# Patient Record
Sex: Male | Born: 1980 | Race: Black or African American | Hispanic: No | Marital: Married | Smoking: Never smoker
Health system: Southern US, Community
[De-identification: ages and names within clinical notes are randomized; demographics above are authoritative.]

## PROBLEM LIST (undated history)

## (undated) DIAGNOSIS — E11319 Type 2 diabetes mellitus with unspecified diabetic retinopathy without macular edema: Secondary | ICD-10-CM

## (undated) DIAGNOSIS — I10 Essential (primary) hypertension: Secondary | ICD-10-CM

## (undated) DIAGNOSIS — N186 End stage renal disease: Secondary | ICD-10-CM

## (undated) DIAGNOSIS — D649 Anemia, unspecified: Secondary | ICD-10-CM

## (undated) DIAGNOSIS — Z9889 Other specified postprocedural states: Secondary | ICD-10-CM

## (undated) DIAGNOSIS — Z9289 Personal history of other medical treatment: Secondary | ICD-10-CM

## (undated) DIAGNOSIS — F419 Anxiety disorder, unspecified: Secondary | ICD-10-CM

## (undated) DIAGNOSIS — E119 Type 2 diabetes mellitus without complications: Secondary | ICD-10-CM

## (undated) DIAGNOSIS — N289 Disorder of kidney and ureter, unspecified: Secondary | ICD-10-CM

## (undated) DIAGNOSIS — R112 Nausea with vomiting, unspecified: Secondary | ICD-10-CM

## (undated) DIAGNOSIS — I639 Cerebral infarction, unspecified: Secondary | ICD-10-CM

## (undated) HISTORY — DX: Cerebral infarction, unspecified: I63.9

## (undated) HISTORY — PX: OTHER SURGICAL HISTORY: SHX169

## (undated) HISTORY — PX: WISDOM TOOTH EXTRACTION: SHX21

## (undated) HISTORY — DX: Type 2 diabetes mellitus with unspecified diabetic retinopathy without macular edema: E11.319

## (undated) NOTE — *Deleted (*Deleted)
[facility] Note  04/20/2020     CHIEF COMPLAINT Patient presents for No chief complaint on file.   HISTORY OF PRESENT ILLNESS: Bill [name] is a 56 y.o. male who presents to the clinic today for:   pt has had cataract sx OU, he states he feels like it helped his vision  Referring physician: Isaac Bliss, Rayford Halsted, MD [place],  [place] [address]  HISTORICAL INFORMATION:   Selected notes from the MEDICAL RECORD NUMBER Referred by Dr. [name] for concern of severe NPDR / PDR OU; LEE-  Ocular Hx-  PMH-     CURRENT MEDICATIONS: No current outpatient medications on file. (Ophthalmic Drugs)   No current facility-administered medications for this visit. (Ophthalmic Drugs)   Current Outpatient Medications (Other)  Medication Sig  . aspirin (BAYER ASPIRIN EC LOW DOSE) 81 MG EC tablet Take 81 mg by mouth as needed.  [name] 1 GM 210 MG(Fe) tablet Take 210 mg by mouth 3 (three) times daily with meals.  . ferric citrate (AURYXIA) 1 GM 210 MG(Fe) tablet Take 210 mg by mouth as directed.  . fluticasone (FLONASE) 50 MCG/ACT nasal spray Place 1 spray into both nostrils daily as needed for allergies or rhinitis.  . multivitamin (RENA-VIT) TABS tablet Take 1 tablet by mouth daily.  . sertraline (ZOLOFT) 50 MG tablet TAKE 1 TABLET(50 MG) BY MOUTH DAILY   No current facility-administered medications for this visit. (Other)      REVIEW OF SYSTEMS:    ALLERGIES Allergies  Allergen Reactions  . Food     Melons-itchy/headaches    PAST MEDICAL HISTORY Past Medical History:  Diagnosis Date  . Anemia   . Anxiety    situational  . Diabetes mellitus without complication ([place])   . Diabetic retinopathy ([place])    PDR OU  . ESRD on dialysis [facility])    Dialyzing T-Th-S  . History of blood transfusion   . Hypertension   . PONV (postoperative nausea and vomiting)    03/22/2019- only 15 years ago  . Renal disorder   . Stroke  [facility]) 2019   vision   Past Surgical History:  Procedure Laterality Date  . A/V FISTULAGRAM N/A 03/11/2019   Procedure: A/V FISTULAGRAM - Left Arm;  Surgeon: Waynetta Sandy, MD;  Location: [facility];  Service: Cardiovascular;  Laterality: N/A;  . arm surgery Right    Fracture  . AV FISTULA PLACEMENT Left 01/25/2019   Procedure: ARTERIOVENOUS (AV) FISTULA CREATION  LEFT UPPER  ARM;  Surgeon: Serafina Mitchell, MD;  Location: [facility];  Service: Vascular;  Laterality: Left;  . BASCILIC VEIN TRANSPOSITION Left 03/27/2019   Procedure: SECOND STAGE BASILIC VEIN TRANSPOSITION LEFT ARM;  Surgeon: Serafina Mitchell, MD;  Location: [facility];  Service: Vascular;  Laterality: Left;  . WISDOM TOOTH EXTRACTION      FAMILY HISTORY Family History  Problem Relation Age of Onset  . Diabetes Mellitus II Mother   . Hypertension Mother   . Cataracts Mother   . Diabetes Mellitus II Father   . Stroke Father   . Hypertension Father   . Glaucoma Maternal Grandmother   . Amblyopia Neg Hx   . Blindness Neg Hx   . Macular degeneration Neg Hx   . Retinal detachment Neg Hx   . Strabismus Neg Hx   . Retinitis pigmentosa Neg Hx     SOCIAL HISTORY Social History   Tobacco  Use  . Smoking status: Never Smoker  . Smokeless tobacco: Never Used  Vaping Use  . Vaping Use: Never used  Substance Use Topics  . Alcohol use: Not Currently  . Drug use: Not Currently         OPHTHALMIC EXAM:  Not recorded     IMAGING AND PROCEDURES  Imaging and Procedures for @TODAY @           ASSESSMENT/PLAN:   ICD-10-CM   1. Proliferative diabetic retinopathy of both eyes with macular edema associated with type 2 diabetes mellitus ([place])  [id number]   2. Vitreous hemorrhage of both eyes ([place])  H43.13   3. Retinal edema  H35.81   4. Essential hypertension  I10   5. Hypertensive retinopathy of both eyes  H35.033   6. Ocular hypertension, bilateral  H40.053   7. Acute CVA (cerebrovascular accident)  ([place])  I63.9   8. Combined forms of age-related cataract of right eye  H25.811   9. Pseudophakia  Z96.1   10. Combined forms of age-related cataract of both eyes  H25.813   11. History of noncompliance with medical treatment  Z91.19   12. Proliferative diabetic retinopathy of right eye with macular edema associated with type 2 diabetes mellitus ([place])  E11.3511   13. Severe nonproliferative diabetic retinopathy of left eye with macular edema associated with type 2 diabetes mellitus ([place])  [id number]   14. Moderate nonproliferative diabetic retinopathy of both eyes with macular edema associated with type 2 diabetes mellitus ([place])  [id number]    1-3. Proliferative diabetic retinopathy OU  - delayed f/u from 4 wks to 8 wks  - s/p PRP OD (10.02.19), fill-in (01.22.20), fill in OD (02.12.20)   - s/p PRP OS (05/02/2018), fill-in (02.19.20)  - s/p IVK #1 OD (01.22.20), OS (02.12.20)  - s/p IVA OD #1 (10.21.20), #2 (12.02.20), #3 (01.13.21), #4 (02.10.21), #5 (04.19.21), #6 (05.21.21), #7 (06.21.21), #8 (08.19.21), #9 (10.11.21)  - s/p IVA OS #1 (12.02.20), #2 (04.19.21), #3 (05.21.21), #4 (06.21.21), #5 (08.19.21), #6 (10.11.21)  - FA 8.19.19 shows leaking MA, patches of capillary nonperfusion OU  - pt was lost to f/u from 02.19.2020 to 10.21.20  - discussed the importance of compliance with medical therapy and follow ups  - presented acutely (10.21.2020) for decreased vision and new onset floaters OD  - today, pt reports subjective improvement in vision OU -- had cataract surgery OS w/ Dr. Kathlen Mody  - BCVA stable at 20/40 OD; OS stable at 20/50   - exam shows interval decrease in VH, preretinal and subhyaloid hemorrhage, persistent preretinal fibrosis OU  - OCT shows Mild Interval improvement in vitreous opacities OU  - repeat FA 10.11.21 shows persistent NV OU, large patches of vascular nonperfusion OU (OS>OD) -- would benefit from some fill in PRP  - VH precautions reviewed -- minimize activities, keep  head elevated, avoid ASA/NSAIDs/blood thinners as able  - pt now on heparin  - discussed possibility of surgery being needed in the future to stabilize eye and prevent further progression of diabetic eye disease  - recommend IVA OD #10 and OS #7 today, 11.08.21  - pt wishes to proceed  - RBA of procedure discussed, questions answered  - informed consent obtained   - Avastin informed consent form signed and scanned on 01.13.2021  - see procedure note  - f/u 4 weeks -- DFE/OCT, possible injection  4,5. 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 [name] in mid August 2019 with  a 2 wk history intractable headaches and decreased vision OU  - found to have BP of 170s/90s -- sent to ED and was admitted to [facility] where SBP was up to 200  - discussed importance of tight BP control  - pt reports improved compliance with BP medications and improvement in BP  6. 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 Limited 2 sequence MRI head: Acute RIGHT periatrial subcentimeter  nonhemorrhagic infarct, in region of optic tract.  - could be limiting vision -- will check formal visual field once macular edema improves  7. Ocular Hypertention OU  - today, IOP OD 14, OS 15  - pt reports not taking Cosopt BID OU for about a month -- IOP okay  8. Mixed cataract OD - The symptoms of cataract, surgical options, and treatments and risks were discussed with patient. - discussed diagnosis and progression - under the expert management of Dr. Kathlen Mody  9. Pseudophakia OS  - s/p CE/IOL (07.29.21, Dr. Kathlen Mody)  - IOL in good position, doing well - monitor   Ophthalmic Meds Ordered this visit:  No orders of the defined types were placed in this encounter.      No follow-ups on file.  There are no Patient Instructions on file for this visit.  This document serves as a record of services personally performed by Gardiner Sleeper, MD,  PhD. It was created on their behalf by Leeann Must, [place], an ophthalmic technician. The creation of this record is the provider's dictation and/or activities during the visit.    Electronically signed by: Leeann Must, COA @TODAY @ 1:53 PM   Abbreviations: M myopia (nearsighted); A astigmatism; H hyperopia (farsighted); P presbyopia; Mrx spectacle prescription;  CTL contact lenses; OD right eye; OS left eye; OU both eyes  XT exotropia; ET esotropia; PEK punctate epithelial keratitis; PEE punctate epithelial erosions; DES dry eye syndrome; MGD meibomian gland dysfunction; ATs artificial tears; PFAT's preservative free artificial tears; [place] nuclear sclerotic cataract; PSC posterior subcapsular cataract; ERM epi-retinal membrane; PVD posterior vitreous detachment; RD retinal detachment; DM diabetes mellitus; DR diabetic retinopathy; NPDR non-proliferative diabetic retinopathy; PDR proliferative diabetic retinopathy; CSME clinically significant macular edema; DME diabetic macular edema; dbh dot blot hemorrhages; CWS cotton wool spot; POAG primary open angle glaucoma; C/D cup-to-disc ratio; HVF humphrey visual field; GVF goldmann visual field; OCT optical coherence tomography; IOP intraocular pressure; BRVO Branch retinal vein occlusion; CRVO central retinal vein occlusion; CRAO central retinal artery occlusion; BRAO branch retinal artery occlusion; RT retinal tear; SB scleral buckle; PPV pars plana vitrectomy; VH Vitreous hemorrhage; PRP panretinal laser photocoagulation; IVK intravitreal kenalog; VMT vitreomacular traction; MH Macular hole;  NVD neovascularization of the disc; NVE neovascularization elsewhere; AREDS age related eye disease study; ARMD age related macular degeneration; POAG primary open angle glaucoma; EBMD epithelial/anterior basement membrane dystrophy; ACIOL anterior chamber intraocular lens; IOL intraocular lens; PCIOL posterior chamber intraocular lens; Phaco/IOL phacoemulsification with  intraocular lens placement; [place] photorefractive keratectomy; LASIK laser assisted in situ keratomileusis; HTN hypertension; DM diabetes mellitus; COPD chronic obstructive pulmonary disease

## (undated) NOTE — *Deleted (*Deleted)
[facility] Note  02/26/2020     CHIEF COMPLAINT Patient presents for No chief complaint on file.   HISTORY OF PRESENT ILLNESS: Bill [name] is a 76 y.o. male who presents to the clinic today for:   pt had cataract sx with Dr. Kathlen Mody 3 weeks ago, he has a f/u appt with him this afternoon, today was his last day of post op drops, he got put on the kidney donor transplant list 2 weeks ago   Referring physician: Isaac Bliss, Rayford Halsted, MD [place],  [place] [address]  HISTORICAL INFORMATION:   Selected notes from the [facility] Referred by Dr. [name] for concern of severe NPDR / PDR OU; LEE-  Ocular Hx-  PMH-     CURRENT MEDICATIONS: Current Outpatient Medications (Ophthalmic Drugs)  Medication Sig  . dorzolamide-timolol (COSOPT) 22.3-6.8 MG/ML ophthalmic solution Place 1 drop into the right eye 2 (two) times daily. (Patient not taking: Reported on 07/24/2019)   No current facility-administered medications for this visit. (Ophthalmic Drugs)   Current Outpatient Medications (Other)  Medication Sig  . aspirin (BAYER ASPIRIN EC LOW DOSE) 81 MG EC tablet Take 81 mg by mouth as needed.  Marland Kitchen atorvastatin (LIPITOR) 80 MG tablet Take 1 tablet (80 mg total) by mouth every evening.  [name] 1 GM 210 MG(Fe) tablet Take 210 mg by mouth 3 (three) times daily with meals.  . ferric citrate (AURYXIA) 1 GM 210 MG(Fe) tablet Take 210 mg by mouth as directed.  . fluticasone (FLONASE) 50 MCG/ACT nasal spray Place 1 spray into both nostrils daily as needed for allergies or rhinitis.  . multivitamin (RENA-VIT) TABS tablet Take 1 tablet by mouth daily.  . sertraline (ZOLOFT) 50 MG tablet TAKE 1 TABLET(50 MG) BY MOUTH DAILY   No current facility-administered medications for this visit. (Other)      REVIEW OF SYSTEMS:    ALLERGIES Allergies  Allergen Reactions  . Food     Melons-itchy/headaches    PAST MEDICAL  HISTORY Past Medical History:  Diagnosis Date  . Anemia   . Anxiety    situational  . Diabetes mellitus without complication ([place])   . Diabetic retinopathy ([place])    PDR OU  . ESRD on dialysis [facility])    Dialyzing T-Th-S  . History of blood transfusion   . Hypertension   . PONV (postoperative nausea and vomiting)    03/22/2019- only 15 years ago  . Renal disorder   . Stroke [facility]) 2019   vision   Past Surgical History:  Procedure Laterality Date  . A/V FISTULAGRAM N/A 03/11/2019   Procedure: A/V FISTULAGRAM - Left Arm;  Surgeon: Waynetta Sandy, MD;  Location: [facility];  Service: Cardiovascular;  Laterality: N/A;  . arm surgery Right    Fracture  . AV FISTULA PLACEMENT Left 01/25/2019   Procedure: ARTERIOVENOUS (AV) FISTULA CREATION  LEFT UPPER  ARM;  Surgeon: Serafina Mitchell, MD;  Location: [facility];  Service: Vascular;  Laterality: Left;  . BASCILIC VEIN TRANSPOSITION Left 03/27/2019   Procedure: SECOND STAGE BASILIC VEIN TRANSPOSITION LEFT ARM;  Surgeon: Serafina Mitchell, MD;  Location: [facility];  Service: Vascular;  Laterality: Left;  . WISDOM TOOTH EXTRACTION      FAMILY HISTORY Family History  Problem Relation Age of Onset  . Diabetes Mellitus II Mother   . Hypertension Mother   . Cataracts Mother   . Diabetes Mellitus  II Father   . Stroke Father   . Hypertension Father   . Glaucoma Maternal Grandmother   . Amblyopia Neg Hx   . Blindness Neg Hx   . Macular degeneration Neg Hx   . Retinal detachment Neg Hx   . Strabismus Neg Hx   . Retinitis pigmentosa Neg Hx     SOCIAL HISTORY Social History   Tobacco Use  . Smoking status: Never Smoker  . Smokeless tobacco: Never Used  Vaping Use  . Vaping Use: Never used  Substance Use Topics  . Alcohol use: Not Currently  . Drug use: Not Currently         OPHTHALMIC EXAM:  Not recorded     IMAGING AND PROCEDURES  Imaging and Procedures for @TODAY @           ASSESSMENT/PLAN:   ICD-10-CM    1. Proliferative diabetic retinopathy of both eyes with macular edema associated with type 2 diabetes mellitus ([place])  [id number]   2. Vitreous hemorrhage of both eyes ([place])  H43.13   3. Retinal edema  H35.81 OCT, Retina - OU - Both Eyes  4. Essential hypertension  I10   5. Hypertensive retinopathy of both eyes  H35.033   6. Acute CVA (cerebrovascular accident) ([place])  I63.9   7. Ocular hypertension, bilateral  H40.053   8. Combined forms of age-related cataract of right eye  H25.811   9. Pseudophakia  Z96.1    1-3. Proliferative diabetic retinopathy OU  - s/p PRP OD (10.02.19), fill-in (01.22.20), fill in OD (02.12.20)   - s/p PRP OS (05/02/2018), fill-in (02.19.20)  - s/p IVK #1 OD (01.22.20), OS (02.12.20)  - s/p IVA OD #1 (10.21.20), #2 (12.02.20), #3 (01.13.21), #4 (02.10.21), #5 (04.19.21), #6 (05.21.21), #7 (06.21.21), #8 (08.19.2021)  - s/p IVA OS #1 (12.02.20), #2 (04.19.21), #3 (05.21.21), #4 (06.21.21), #5 (08.19.2021)  - FA 8.19.19 shows leaking MA, patches of capillary nonperfusion OU  - pt was lost to f/u from 02.19.2020 to 10.21.20  - discussed the importance of compliance with medical therapy and follow ups  - presented acutely (10.21.2020) for decreased vision and new onset floaters OD  - today, pt reports subjective improvement in vision OU -- had cataract surgery OS w/ Dr. Kathlen Mody  - BCVA 20/40 OD (improved from 20/50); OS 20/50 (improved after cataract sx)  - exam shows interval decrease in VH, preretinal and subhyaloid hemorrhage, persistent preretinal fibrosis OU  - OCT OD: Mild Interval improvement in vitreous opacities and IRF/edema, persistent tractional membranes; OS: Mild interval improvement in vitreous opacities and IRF; persistent diffuse retinal atrophy and persistent tractional membranes  - VH precautions reviewed -- minimize activities, keep head elevated, avoid ASA/NSAIDs/blood thinners as able  - pt now on heparin  - discussed possibility of surgery being  needed in the future to stabilize eye and prevent further progression of diabetic eye disease  - recommend IVA OD #9 and OS #6 today, 09.15.21  - pt wishes to proceed  - RBA of procedure discussed, questions answered  - informed consent obtained   - Avastin informed consent form signed and scanned on 01.13.2021  - see procedure note  - f/u 4 weeks -- DFE/OCT, possible injection  4,5. 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 [name] in mid August 2019 with a 2 wk history intractable headaches and decreased vision OU  - found to have BP of 170s/90s -- sent to ED and was admitted to [facility] where  SBP was up to 200  - discussed importance of tight BP control  - pt reports improved compliance with BP medications and improvement in BP  6. 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 Limited 2 sequence MRI head: Acute RIGHT periatrial subcentimeter  nonhemorrhagic infarct, in region of optic tract.  - could be limiting vision -- will check formal visual field once macular edema improves  7. Ocular Hypertention OU  - today, IOP OD 17, OS 15  - pt reports not taking Cosopt BID OU for about a month -- IOP okay  8. Mixed cataract OD - The symptoms of cataract, surgical options, and treatments and risks were discussed with patient. - discussed diagnosis and progression - not yet visually significant - monitor for now - under the expert management of Dr. Kathlen Mody  9. Pseudophakia OS  - s/p CE/IOL (07.29.21, Dr. Kathlen Mody)  - IOL in good position, doing well - monitor   Ophthalmic Meds Ordered this visit:  No orders of the defined types were placed in this encounter.      No follow-ups on file.  There are no Patient Instructions on file for this visit.   Explained the diagnoses, plan, and follow up with the patient and they expressed understanding.  Patient expressed understanding of the importance of proper  follow up care.   This document serves as a record of services personally performed by Gardiner Sleeper, MD, PhD. It was created on their behalf by Roselee Nova, COMT. The creation of this record is the provider's dictation and/or activities during the visit.  Electronically signed by: Roselee Nova, COMT 02/26/20 9:50 AM     Gardiner Sleeper, M.D., Ph.D. Diseases & Surgery of the Retina and Vitreous [facility]: M myopia (nearsighted); A astigmatism; H hyperopia (farsighted); P presbyopia; Mrx spectacle prescription;  CTL contact lenses; OD right eye; OS left eye; OU both eyes  XT exotropia; ET esotropia; PEK punctate epithelial keratitis; PEE punctate epithelial erosions; DES dry eye syndrome; MGD meibomian gland dysfunction; ATs artificial tears; PFAT's preservative free artificial tears; [place] nuclear sclerotic cataract; PSC posterior subcapsular cataract; ERM epi-retinal membrane; PVD posterior vitreous detachment; RD retinal detachment; DM diabetes mellitus; DR diabetic retinopathy; NPDR non-proliferative diabetic retinopathy; PDR proliferative diabetic retinopathy; CSME clinically significant macular edema; DME diabetic macular edema; dbh dot blot hemorrhages; CWS cotton wool spot; POAG primary open angle glaucoma; C/D cup-to-disc ratio; HVF humphrey visual field; GVF goldmann visual field; OCT optical coherence tomography; IOP intraocular pressure; BRVO Branch retinal vein occlusion; CRVO central retinal vein occlusion; CRAO central retinal artery occlusion; BRAO branch retinal artery occlusion; RT retinal tear; SB scleral buckle; PPV pars plana vitrectomy; VH Vitreous hemorrhage; PRP panretinal laser photocoagulation; IVK intravitreal kenalog; VMT vitreomacular traction; MH Macular hole;  NVD neovascularization of the disc; NVE neovascularization elsewhere; AREDS age related eye disease study; ARMD age related macular degeneration; POAG primary open angle  glaucoma; EBMD epithelial/anterior basement membrane dystrophy; ACIOL anterior chamber intraocular lens; IOL intraocular lens; PCIOL posterior chamber intraocular lens; Phaco/IOL phacoemulsification with intraocular lens placement; [place] photorefractive keratectomy; LASIK laser assisted in situ keratomileusis; HTN hypertension; DM diabetes mellitus; COPD chronic obstructive pulmonary disease

---

## 1999-06-26 ENCOUNTER — Encounter: Payer: Self-pay | Admitting: Emergency Medicine

## 1999-06-26 ENCOUNTER — Emergency Department (HOSPITAL_COMMUNITY): Admission: EM | Admit: 1999-06-26 | Discharge: 1999-06-26 | Payer: Self-pay | Admitting: Emergency Medicine

## 1999-06-27 ENCOUNTER — Encounter: Payer: Self-pay | Admitting: Orthopedic Surgery

## 1999-07-29 ENCOUNTER — Ambulatory Visit (HOSPITAL_BASED_OUTPATIENT_CLINIC_OR_DEPARTMENT_OTHER): Admission: RE | Admit: 1999-07-29 | Discharge: 1999-07-30 | Payer: Self-pay | Admitting: Orthopedic Surgery

## 2004-09-08 ENCOUNTER — Emergency Department (HOSPITAL_COMMUNITY): Admission: EM | Admit: 2004-09-08 | Discharge: 2004-09-08 | Payer: Self-pay | Admitting: *Deleted

## 2005-09-01 ENCOUNTER — Emergency Department (HOSPITAL_COMMUNITY): Admission: EM | Admit: 2005-09-01 | Discharge: 2005-09-01 | Payer: Self-pay | Admitting: Emergency Medicine

## 2017-06-13 DIAGNOSIS — I639 Cerebral infarction, unspecified: Secondary | ICD-10-CM

## 2017-06-13 HISTORY — DX: Cerebral infarction, unspecified: I63.9

## 2018-01-24 ENCOUNTER — Encounter (HOSPITAL_COMMUNITY): Payer: Self-pay

## 2018-01-24 ENCOUNTER — Emergency Department (HOSPITAL_COMMUNITY): Payer: Self-pay

## 2018-01-24 ENCOUNTER — Inpatient Hospital Stay (HOSPITAL_COMMUNITY)
Admission: EM | Admit: 2018-01-24 | Discharge: 2018-01-26 | DRG: 065 | Disposition: A | Discharge status: Home / Self Care | Payer: Self-pay | Attending: Family Medicine | Admitting: Family Medicine

## 2018-01-24 ENCOUNTER — Other Ambulatory Visit: Payer: Self-pay

## 2018-01-24 DIAGNOSIS — G44209 Tension-type headache, unspecified, not intractable: Secondary | ICD-10-CM

## 2018-01-24 DIAGNOSIS — Z7984 Long term (current) use of oral hypoglycemic drugs: Secondary | ICD-10-CM

## 2018-01-24 DIAGNOSIS — E785 Hyperlipidemia, unspecified: Secondary | ICD-10-CM | POA: Diagnosis present

## 2018-01-24 DIAGNOSIS — Z833 Family history of diabetes mellitus: Secondary | ICD-10-CM

## 2018-01-24 DIAGNOSIS — I160 Hypertensive urgency: Secondary | ICD-10-CM | POA: Diagnosis present

## 2018-01-24 DIAGNOSIS — R29700 NIHSS score 0: Secondary | ICD-10-CM | POA: Diagnosis present

## 2018-01-24 DIAGNOSIS — E1159 Type 2 diabetes mellitus with other circulatory complications: Secondary | ICD-10-CM | POA: Diagnosis present

## 2018-01-24 DIAGNOSIS — H3563 Retinal hemorrhage, bilateral: Secondary | ICD-10-CM | POA: Diagnosis present

## 2018-01-24 DIAGNOSIS — I10 Essential (primary) hypertension: Secondary | ICD-10-CM | POA: Diagnosis present

## 2018-01-24 DIAGNOSIS — I639 Cerebral infarction, unspecified: Principal | ICD-10-CM | POA: Diagnosis present

## 2018-01-24 DIAGNOSIS — I161 Hypertensive emergency: Secondary | ICD-10-CM | POA: Diagnosis present

## 2018-01-24 DIAGNOSIS — Z823 Family history of stroke: Secondary | ICD-10-CM

## 2018-01-24 DIAGNOSIS — D649 Anemia, unspecified: Secondary | ICD-10-CM | POA: Diagnosis present

## 2018-01-24 DIAGNOSIS — E1151 Type 2 diabetes mellitus with diabetic peripheral angiopathy without gangrene: Secondary | ICD-10-CM | POA: Diagnosis present

## 2018-01-24 DIAGNOSIS — H538 Other visual disturbances: Secondary | ICD-10-CM

## 2018-01-24 DIAGNOSIS — G44201 Tension-type headache, unspecified, intractable: Secondary | ICD-10-CM | POA: Diagnosis present

## 2018-01-24 HISTORY — DX: Type 2 diabetes mellitus without complications: E11.9

## 2018-01-24 LAB — CBC WITH DIFFERENTIAL/PLATELET
Abs Immature Granulocytes: 0 10*3/uL (ref 0.0–0.1)
Basophils Absolute: 0 10*3/uL (ref 0.0–0.1)
Basophils Relative: 1 %
Eosinophils Absolute: 0.1 10*3/uL (ref 0.0–0.7)
Eosinophils Relative: 3 %
HCT: 33.5 % — ABNORMAL LOW (ref 39.0–52.0)
Hemoglobin: 10.6 g/dL — ABNORMAL LOW (ref 13.0–17.0)
Immature Granulocytes: 0 %
Lymphocytes Relative: 35 %
Lymphs Abs: 1.6 10*3/uL (ref 0.7–4.0)
MCH: 25.6 pg — ABNORMAL LOW (ref 26.0–34.0)
MCHC: 31.6 g/dL (ref 30.0–36.0)
MCV: 80.9 fL (ref 78.0–100.0)
Monocytes Absolute: 0.4 10*3/uL (ref 0.1–1.0)
Monocytes Relative: 8 %
Neutro Abs: 2.3 10*3/uL (ref 1.7–7.7)
Neutrophils Relative %: 53 %
Platelets: 194 10*3/uL (ref 150–400)
RBC: 4.14 MIL/uL — ABNORMAL LOW (ref 4.22–5.81)
RDW: 14.7 % (ref 11.5–15.5)
WBC: 4.4 10*3/uL (ref 4.0–10.5)

## 2018-01-24 LAB — URINALYSIS, ROUTINE W REFLEX MICROSCOPIC
Bilirubin Urine: NEGATIVE
Glucose, UA: NEGATIVE mg/dL
Ketones, ur: NEGATIVE mg/dL
Leukocytes, UA: NEGATIVE
Nitrite: NEGATIVE
Protein, ur: 100 mg/dL — AB
Specific Gravity, Urine: >1.03 — ABNORMAL HIGH (ref 1.005–1.030)
pH: 5.5 (ref 5.0–8.0)

## 2018-01-24 LAB — COMPREHENSIVE METABOLIC PANEL
ALT: 13 U/L (ref 0–44)
AST: 14 U/L — ABNORMAL LOW (ref 15–41)
Albumin: 3.2 g/dL — ABNORMAL LOW (ref 3.5–5.0)
Alkaline Phosphatase: 39 U/L (ref 38–126)
Anion gap: 8 (ref 5–15)
BUN: 29 mg/dL — ABNORMAL HIGH (ref 6–20)
CO2: 19 mmol/L — ABNORMAL LOW (ref 22–32)
Calcium: 8.9 mg/dL (ref 8.9–10.3)
Chloride: 114 mmol/L — ABNORMAL HIGH (ref 98–111)
Creatinine, Ser: 1.19 mg/dL (ref 0.61–1.24)
GFR calc Af Amer: >60 mL/min (ref >60)
GFR calc non Af Amer: >60 mL/min (ref >60)
Glucose, Bld: 147 mg/dL — ABNORMAL HIGH (ref 70–99)
Potassium: 4.9 mmol/L (ref 3.5–5.1)
Sodium: 141 mmol/L (ref 135–145)
Total Bilirubin: 0.6 mg/dL (ref 0.3–1.2)
Total Protein: 6.4 g/dL — ABNORMAL LOW (ref 6.5–8.1)

## 2018-01-24 LAB — PROTIME-INR
INR: 0.95
Prothrombin Time: 12.6 seconds (ref 11.4–15.2)

## 2018-01-24 LAB — URINALYSIS, MICROSCOPIC (REFLEX)
Bacteria, UA: NONE SEEN
WBC, UA: NONE SEEN WBC/hpf (ref 0–5)

## 2018-01-24 LAB — TROPONIN I: Troponin I: <0.03 ng/mL (ref <0.03)

## 2018-01-24 MED ORDER — LORAZEPAM 1 MG PO TABS
1.0000 mg | ORAL_TABLET | Freq: Once | ORAL | Status: AC
  Administered 2018-01-24: 1 mg via ORAL
  Filled 2018-01-24: qty 1

## 2018-01-24 MED ORDER — HYDROCHLOROTHIAZIDE 25 MG PO TABS: 25.0000 mg | ORAL_TABLET | Freq: Every day | ORAL | 0 refills | Status: DC

## 2018-01-24 MED ORDER — LABETALOL HCL 5 MG/ML IV SOLN: 10.0000 mg | Freq: Once | INTRAVENOUS | Status: DC

## 2018-01-24 NOTE — ED Triage Notes (Signed)
Pt states that he had his BP checked at his PCP and it was 175 and was advised to come here. Pt reports he has been having migraines and blurry vision. Pt went to see eye doctor today and was told that he has bleeding behind his retina.

## 2018-01-24 NOTE — ED Notes (Signed)
Patient transported to CT 

## 2018-01-24 NOTE — ED Provider Notes (Signed)
Emergency Department Provider Note   I have reviewed the triage vital signs and the nursing notes.   HISTORY  Chief Complaint Hypertension   HPI Bill [name] is a 37 y.o. male with PMH of DM and new diagnosis of HTN's to the emergency department for evaluation of possible retinal hemorrhages setting of blurry vision and newly diagnosed hypertension.  The patient has had bilateral blurry vision for the past 2 weeks.  He has had migraine type headaches over that time which have improved slightly but continue.  He went to his primary care physician and was found to have significantly elevated blood pressure.  He was not started on medication at that time but referred to ophthalmology which she saw today.  They evaluated the patient and told him that he has "bleeding behind the retinas" and needed to come to the emergency department immediately.  He denies any double vision, speech changes, difficulty swallowing.  No gait instability.  No unilateral weakness or numbness.  Past Medical History:  Diagnosis Date  . Diabetes mellitus without complication ([place])     There are no active problems to display for this patient.   History reviewed. No pertinent surgical history.   Allergies Patient has no known allergies.  No family history on file.  Social History Social History   Tobacco Use  . Smoking status: Never Smoker  . Smokeless tobacco: Never Used  Substance Use Topics  . Alcohol use: Yes  . Drug use: Not on file    Review of Systems  Constitutional: No fever/chills Eyes: Positive visual changes. ENT: No sore throat. Cardiovascular: Denies chest pain. Positive HTN.  Respiratory: Denies shortness of breath. Gastrointestinal: No abdominal pain.  No nausea, no vomiting.  No diarrhea.  No constipation. Genitourinary: Negative for dysuria. Musculoskeletal: Negative for back pain. Skin: Negative for rash. Neurological: Negative for  focal weakness or numbness. Positive  HA.   10-point ROS otherwise negative.  ____________________________________________   PHYSICAL EXAM:  VITAL SIGNS: ED Triage Vitals  Enc Vitals Group     BP 01/24/18 1626 (!) 186/97     Pulse Rate 01/24/18 1626 83     Resp 01/24/18 1626 18     Temp 01/24/18 1626 97.8 F (36.6 C)     Temp Source 01/24/18 1626 Oral     SpO2 01/24/18 1626 100 %     Weight 01/24/18 1626 189 lb (85.7 kg)     Height 01/24/18 1626 5\' 10"  (1.778 m)     Pain Score 01/24/18 1628 3   Constitutional: Alert and oriented. Well appearing and in no acute distress. Eyes: Conjunctivae are normal. PERRL. EOMI. Fundi not clear visualized.  Head: Atraumatic. Nose: No congestion/rhinnorhea. Mouth/Throat: Mucous membranes are moist.  Neck: No stridor.   Cardiovascular: Normal rate, regular rhythm. Good peripheral circulation. Grossly normal heart sounds.   Respiratory: Normal respiratory effort.  No retractions. Lungs CTAB. Gastrointestinal: Soft and nontender. No distention.  Musculoskeletal: No lower extremity tenderness nor edema. No gross deformities of extremities. Neurologic:  Normal speech and language. No gross focal neurologic deficits are appreciated. Normal CN exam 2-12.  Skin:  Skin is warm, dry and intact. No rash noted.  ____________________________________________   LABS (all labs ordered are listed, but only abnormal results are displayed)  Labs Reviewed  COMPREHENSIVE METABOLIC PANEL - Abnormal; Notable for the following components:      Result Value   Chloride 114 (*)    CO2 19 (*)    Glucose, Bld 147 (*)  BUN 29 (*)    Total Protein 6.4 (*)    Albumin 3.2 (*)    AST 14 (*)    All other components within normal limits  CBC WITH DIFFERENTIAL/PLATELET - Abnormal; Notable for the following components:   RBC 4.14 (*)    Hemoglobin 10.6 (*)    HCT 33.5 (*)    MCH 25.6 (*)    All other components within normal limits  URINALYSIS, ROUTINE W REFLEX MICROSCOPIC - Abnormal; Notable for  the following components:   Color, Urine YELLOW (*)    APPearance CLEAR (*)    Specific Gravity, Urine >1.030 (*)    Hgb urine dipstick LARGE (*)    Protein, ur 100 (*)    All other components within normal limits  PROTIME-INR  TROPONIN I  URINALYSIS, MICROSCOPIC (REFLEX)   ____________________________________________  EKG   EKG Interpretation  Date/Time:  Wednesday January 24 2018 18:08:37 EDT Ventricular Rate:  88 PR Interval:    QRS Duration: 95 QT Interval:  362 QTC Calculation: 438 R Axis:   41 Text Interpretation:  Sinus rhythm Consider right atrial enlargement ST elev, probable normal early repol pattern No STEMI.  Confirmed by Nanda Quinton [id number]) on 01/24/2018 6:31:45 PM       ____________________________________________  RADIOLOGY  Ct Head Wo Contrast  Result Date: 01/24/2018 CLINICAL DATA:  Blurred vision. Told he had blood behind retina. High blood pressure. Diabetes. EXAM: CT HEAD WITHOUT CONTRAST TECHNIQUE: Contiguous axial images were obtained from the base of the skull through the vertex without intravenous contrast. COMPARISON:  None. FINDINGS: Brain: No mass lesion, hemorrhage, hydrocephalus, acute infarct, intra-axial, or extra-axial fluid collection. Vascular: No hyperdense vessel or unexpected calcification. Skull: Normal. Sinuses/Orbits: Normal appearance of the orbits and globes. Minimal left maxillary sinus mucosal thickening. Clear mastoid air cells. Other: None. IMPRESSION: 1.  No acute intracranial abnormality. 2. Minimal sinus disease. Electronically Signed   By: Abigail Miyamoto M.D.   On: 01/24/2018 19:19    ____________________________________________   PROCEDURES  Procedure(s) performed:   Procedures  None ____________________________________________   INITIAL IMPRESSION / ASSESSMENT AND PLAN / ED COURSE  Pertinent labs & imaging results that were available during my care of the patient were reviewed by me and considered in my medical  decision making (see chart for details).  Patient presents to the emergency department with newly diagnosed hypertension with blurry vision and headaches.  Ophthalmology evaluated the patient today and reportedly told the patient he had bleeding and the retinas and needed to present to the emergency department.  Patient has no focal neurological deficits.  Plan for CT imaging of the head along with lab work and urinalysis.  EKG reviewed with no acute findings.  07:37 PM Patient with 20/70 vision bilaterally. CT head is negative. Labs unremarkable. Plan for MRI brain and orbits. Patient has an appointment with the retina specialist in the AM. Discussed admit for HTN mgmt but patient is very hesitant because of this specialist appointment. Plan for MRI brain and orbits. If negative, will discharge home with HCTZ and PCP/retina follow up but if acute findings he will need admit.   Care transferred to [facility], [place].    ____________________________________________  FINAL CLINICAL IMPRESSION(S) / ED DIAGNOSES  Final diagnoses:  Blurry vision, bilateral  Acute non intractable tension-type headache     Note:  This document was prepared using Dragon voice recognition software and may include unintentional dictation errors.  Nanda Quinton, MD Emergency Medicine    Long, Vonna Kotyk  G, MD 01/25/18 [phone]

## 2018-01-24 NOTE — ED Notes (Signed)
Patient transported to MRI 

## 2018-01-24 NOTE — ED Provider Notes (Signed)
Patient placed in Quick Look pathway, seen and evaluated   Chief Complaint: headache  HPI:   Ikechukwu Cerny is a 37 y.o. male who presents to the ED with headache. Patient went to his eye doctor, [facility], appointment  today and his BP was elevated. The eye doctor told the patient to come to the ED for evaluation. Patient has been having blurry vision and "migranes" the eye doctor told the patient he had bleeding behind his retina.   ROS: Neuro: headache  Physical Exam:  BP (!) 186/97 (BP Location: Right Arm)   Pulse 83   Temp 97.8 F (36.6 C) (Oral)   Resp 18   Ht 5\' 10"  (1.778 m)   Wt 85.7 kg   SpO2 100%   BMI 27.12 kg/m    Gen: No distress  Neuro: Awake and Alert, grips are equal  Skin: Warm and warm  Eyes: good occular mentment     Initiation of care has begun. The patient has been counseled on the process, plan, and necessity for staying for the completion/evaluation, and the remainder of the medical screening examination    Ashley Murrain, NP 01/26/18 1910    Carmin Muskrat, MD 01/27/18 2352

## 2018-01-24 NOTE — ED Provider Notes (Signed)
76:22 PM 37 year old diabetic presents to the emergency department at the advice of his ophthalmologist after he was noted to have "bleeding behind the retinas".  Found to have new onset hypertension while at his primary care office as well.  Per note from prior provider, "The patient has had bilateral blurry vision for the past 2 weeks.  He has had migraine type headaches over that time which have improved slightly but continue".  MRI pending at change of shift.  Results for MRI brain reviewed.  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  May be accounting for the patient's vision changes recently.  MR orbits pending.  Will consult with Dr. Lorraine Lax of neurology; anticipate medical admission.  12:10 AM Repeat page placed to Neurology.  12:16 AM Case discussed with Dr. Lorraine Lax of neurology.  He has reviewed the patient's MRI.  Notes a small periventricular infarct.  He believes this finding more likely reflects setting of hypertensive emergency.  Feels the infarct is unlikely to be contributing to the patient's vision changes.  Recommends blood pressure control, optimally with reduction of current BP by ~25%.  Neurology recommendations are for hydralazine and/or labetalol PRN.  12:41 AM Case discussed with Dr. Hal [facility] of [facility] who will admit. BP medications currently withheld in the ED as resting BP 156/94.   Vitals:   01/24/18 2115 01/24/18 2145 01/25/18 0000 01/25/18 0030  BP: (!) 178/105 (!) 181/103 (!) 180/99 (!) 156/94  Pulse: 83 86  91  Resp: 19 15 20 19   Temp:      TempSrc:      SpO2:      Weight:      Height:           Antonietta Breach, PA-C 01/25/18 0041    Daleen Bo, MD 01/25/18 [phone]

## 2018-01-25 ENCOUNTER — Observation Stay (HOSPITAL_COMMUNITY): Payer: Self-pay

## 2018-01-25 ENCOUNTER — Encounter (HOSPITAL_COMMUNITY): Payer: Self-pay | Admitting: Internal Medicine

## 2018-01-25 ENCOUNTER — Encounter (INDEPENDENT_AMBULATORY_CARE_PROVIDER_SITE_OTHER): Payer: Self-pay | Admitting: Ophthalmology

## 2018-01-25 ENCOUNTER — Observation Stay (HOSPITAL_BASED_OUTPATIENT_CLINIC_OR_DEPARTMENT_OTHER): Payer: Self-pay

## 2018-01-25 DIAGNOSIS — E1159 Type 2 diabetes mellitus with other circulatory complications: Secondary | ICD-10-CM

## 2018-01-25 DIAGNOSIS — G44209 Tension-type headache, unspecified, not intractable: Secondary | ICD-10-CM

## 2018-01-25 DIAGNOSIS — I639 Cerebral infarction, unspecified: Secondary | ICD-10-CM

## 2018-01-25 DIAGNOSIS — I161 Hypertensive emergency: Secondary | ICD-10-CM

## 2018-01-25 DIAGNOSIS — I340 Nonrheumatic mitral (valve) insufficiency: Secondary | ICD-10-CM

## 2018-01-25 DIAGNOSIS — I361 Nonrheumatic tricuspid (valve) insufficiency: Secondary | ICD-10-CM

## 2018-01-25 LAB — HEMOGLOBIN A1C
Hgb A1c MFr Bld: 10.4 % — ABNORMAL HIGH (ref 4.8–5.6)
Mean Plasma Glucose: 251.78 mg/dL

## 2018-01-25 LAB — GLUCOSE, CAPILLARY
Glucose-Capillary: 127 mg/dL — ABNORMAL HIGH (ref 70–99)
Glucose-Capillary: 146 mg/dL — ABNORMAL HIGH (ref 70–99)
Glucose-Capillary: 202 mg/dL — ABNORMAL HIGH (ref 70–99)
Glucose-Capillary: 106 mg/dL — ABNORMAL HIGH (ref 70–99)

## 2018-01-25 LAB — COMPREHENSIVE METABOLIC PANEL
ALT: 13 U/L (ref 0–44)
AST: 15 U/L (ref 15–41)
Albumin: 2.9 g/dL — ABNORMAL LOW (ref 3.5–5.0)
Alkaline Phosphatase: 37 U/L — ABNORMAL LOW (ref 38–126)
Anion gap: 7 (ref 5–15)
BUN: 22 mg/dL — ABNORMAL HIGH (ref 6–20)
CO2: 25 mmol/L (ref 22–32)
Calcium: 9.3 mg/dL (ref 8.9–10.3)
Chloride: 111 mmol/L (ref 98–111)
Creatinine, Ser: 1.11 mg/dL (ref 0.61–1.24)
GFR calc Af Amer: >60 mL/min (ref >60)
GFR calc non Af Amer: >60 mL/min (ref >60)
Glucose, Bld: 144 mg/dL — ABNORMAL HIGH (ref 70–99)
Potassium: 4.6 mmol/L (ref 3.5–5.1)
Sodium: 143 mmol/L (ref 135–145)
Total Bilirubin: 0.7 mg/dL (ref 0.3–1.2)
Total Protein: 6.3 g/dL — ABNORMAL LOW (ref 6.5–8.1)

## 2018-01-25 LAB — CBC
HCT: 33.4 % — ABNORMAL LOW (ref 39.0–52.0)
Hemoglobin: 10.4 g/dL — ABNORMAL LOW (ref 13.0–17.0)
MCH: 24.8 pg — ABNORMAL LOW (ref 26.0–34.0)
MCHC: 31.1 g/dL (ref 30.0–36.0)
MCV: 79.7 fL (ref 78.0–100.0)
Platelets: 181 10*3/uL (ref 150–400)
RBC: 4.19 MIL/uL — ABNORMAL LOW (ref 4.22–5.81)
RDW: 14.7 % (ref 11.5–15.5)
WBC: 4.2 10*3/uL (ref 4.0–10.5)

## 2018-01-25 LAB — LIPID PANEL
Cholesterol: 244 mg/dL — ABNORMAL HIGH (ref 0–200)
HDL: 36 mg/dL — ABNORMAL LOW (ref >40)
LDL Cholesterol: 189 mg/dL — ABNORMAL HIGH (ref 0–99)
Total CHOL/HDL Ratio: 6.8 RATIO
Triglycerides: 97 mg/dL (ref <150)
VLDL: 19 mg/dL (ref 0–40)

## 2018-01-25 LAB — RAPID URINE DRUG SCREEN, HOSP PERFORMED
Amphetamines: NOT DETECTED
Barbiturates: POSITIVE — AB
Benzodiazepines: POSITIVE — AB
Cocaine: NOT DETECTED
Opiates: NOT DETECTED
Tetrahydrocannabinol: NOT DETECTED

## 2018-01-25 LAB — TROPONIN I
Troponin I: <0.03 ng/mL (ref <0.03)
Troponin I: <0.03 ng/mL (ref <0.03)
Troponin I: <0.03 ng/mL (ref <0.03)

## 2018-01-25 LAB — IRON AND TIBC
Iron: 70 ug/dL (ref 45–182)
Saturation Ratios: 26 % (ref 17.9–39.5)
TIBC: 273 ug/dL (ref 250–450)
UIBC: 203 ug/dL

## 2018-01-25 LAB — FERRITIN: Ferritin: 380 ng/mL — ABNORMAL HIGH (ref 24–336)

## 2018-01-25 LAB — HIV ANTIBODY (ROUTINE TESTING W REFLEX): HIV Screen 4th Generation wRfx: NONREACTIVE

## 2018-01-25 LAB — ECHOCARDIOGRAM COMPLETE
Height: 70 in
Weight: 3024 oz

## 2018-01-25 LAB — VITAMIN B12: Vitamin B-12: 298 pg/mL (ref 180–914)

## 2018-01-25 LAB — RETICULOCYTES
RBC.: 4.24 MIL/uL (ref 4.22–5.81)
Retic Count, Absolute: 42.4 10*3/uL (ref 19.0–186.0)
Retic Ct Pct: 1 % (ref 0.4–3.1)

## 2018-01-25 LAB — FOLATE: Folate: 10 ng/mL (ref >5.9)

## 2018-01-25 MED ORDER — ASPIRIN 325 MG PO TABS: 325.0000 mg | ORAL_TABLET | Freq: Every day | ORAL | Status: DC

## 2018-01-25 MED ORDER — INSULIN ASPART 100 UNIT/ML ~~LOC~~ SOLN
0.0000 [IU] | Freq: Three times a day (TID) | SUBCUTANEOUS | Status: DC
  Administered 2018-01-25 (×2): 1 [IU] via SUBCUTANEOUS
  Administered 2018-01-25: 3 [IU] via SUBCUTANEOUS
  Administered 2018-01-26: 2 [IU] via SUBCUTANEOUS

## 2018-01-25 MED ORDER — ASPIRIN 300 MG RE SUPP: 300.0000 mg | Freq: Every day | RECTAL | Status: DC

## 2018-01-25 MED ORDER — ACETAMINOPHEN 160 MG/5ML PO SOLN: 650.0000 mg | ORAL | Status: DC | PRN

## 2018-01-25 MED ORDER — ATORVASTATIN CALCIUM 80 MG PO TABS
80.0000 mg | ORAL_TABLET | Freq: Every day | ORAL | Status: DC
  Administered 2018-01-25: 80 mg via ORAL
  Filled 2018-01-25: qty 1

## 2018-01-25 MED ORDER — ACETAMINOPHEN 650 MG RE SUPP: 650.0000 mg | RECTAL | Status: DC | PRN

## 2018-01-25 MED ORDER — STROKE: EARLY STAGES OF RECOVERY BOOK
Freq: Once | Status: AC
  Administered 2018-01-25: 07:00:00

## 2018-01-25 MED ORDER — ASPIRIN EC 81 MG PO TBEC
81.0000 mg | DELAYED_RELEASE_TABLET | Freq: Every day | ORAL | Status: DC
  Administered 2018-01-25 – 2018-01-26 (×2): 81 mg via ORAL
  Filled 2018-01-25 (×2): qty 1

## 2018-01-25 MED ORDER — GLUCERNA SHAKE PO LIQD
237.0000 mL | Freq: Two times a day (BID) | ORAL | Status: DC
Start: 2018-01-25 — End: 2018-01-26
  Administered 2018-01-25 – 2018-01-26 (×3): 237 mL via ORAL

## 2018-01-25 MED ORDER — ASPIRIN EC 81 MG PO TBEC: 81.0000 mg | DELAYED_RELEASE_TABLET | Freq: Every day | ORAL | Status: DC

## 2018-01-25 MED ORDER — ENOXAPARIN SODIUM 40 MG/0.4ML ~~LOC~~ SOLN: 40.0000 mg | Freq: Every day | SUBCUTANEOUS | Status: DC

## 2018-01-25 MED ORDER — ACETAMINOPHEN 325 MG PO TABS
650.0000 mg | ORAL_TABLET | ORAL | Status: DC | PRN
  Administered 2018-01-25 (×2): 650 mg via ORAL
  Filled 2018-01-25 (×2): qty 2

## 2018-01-25 MED ORDER — HYDRALAZINE HCL 20 MG/ML IJ SOLN
10.0000 mg | INTRAMUSCULAR | Status: DC | PRN
  Administered 2018-01-25: 10 mg via INTRAVENOUS
  Filled 2018-01-25: qty 1

## 2018-01-25 NOTE — Progress Notes (Signed)
Patient arrived in the floor at 0205 am in a bed from ED, pt is alert and oriented x4 , resting in a bed, attached to tele monitor, pt's BP is high, will provide appr. treatments after assessment, Dr. Dorothea Glassman  saw pt in room. At this moment, there is not much new orders, will page Dr. Dorothea Glassman for new orders, and will continues to monitor pt closely. Pt's family members are on bedside at this moment.

## 2018-01-25 NOTE — Progress Notes (Signed)
Inpatient Diabetes Program Recommendations  AACE/ADA: New Consensus Statement on Inpatient Glycemic Control (2019)  Target Ranges:  Prepandial:   less than 140 mg/dL      Peak postprandial:   less than 180 mg/dL (1-2 hours)      Critically ill patients:  140 - 180 mg/dL   Results for Bill, [name] (MRN [id number]) as of 01/25/2018 13:39  Ref. Range 01/25/2018 06:22 01/25/2018 11:40  Glucose-Capillary Latest Ref Range: 70 - 99 mg/dL 127 (H) 146 (H)   Results for Bill, [name] (MRN [id number]) as of 01/25/2018 13:39  Ref. Range 01/25/2018 04:00  Hemoglobin A1C Latest Ref Range: 4.8 - 5.6 % 10.4 (H)   Review of Glycemic Control  Diabetes history: DM2 Outpatient Diabetes medications: Synjardy 5-500 mg daily Current orders for Inpatient glycemic control: Novolog 0-9 units TID with meals  Inpatient Diabetes Program Recommendations:  HgbA1C: A1C 10.4% on 01/25/18 indicating an average glucose of 252 mg/dl over the past 2-3 months. Patient will need to establish care with PCP in [facility] and follow up.  NOTE: Spoke with patient about diabetes and home regimen for diabetes control. Patient reports that he moved from [place] to [place] last week. Patient was followed by PCP for diabetes management in [place] but he will need to find a new PCP in . Patient states that currently he does not have insurance but he just started a new job and will be eligible for insurance starting in October. Patient reports that he is taking Synjardy 5-500 daily as an outpatient for diabetes control. Patient reports that he has been on Synjardy since March but he admits that he was off of the [place] for a few months and he just started retaking it in June. Patient reports that his PCP in [place] has given him enough Synjardy samples to last him until October when he can get insurance. Patient states that he has a glucometer and testing supplies at home and when he takes Synjardy his glucose is averaging in the  low to mid 100's mg/dl.  Discussed A1C results (10.4% on 01/25/18) and explained that his current A1C indicates an average glucose of 252 mg/dl over the past 2-3 months. Discussed glucose and A1C goals. Discussed importance of checking CBGs and maintaining good CBG control to prevent long-term and short-term complications.  Discussed impact of nutrition, exercise, stress, sickness, and medications on diabetes control. Encouraged patient to establish care with provider in [place]. Discussed [place] [facility].  Patient verbalized understanding of information discussed and he states that he has no further questions at this time related to diabetes.  Thanks, Barnie Alderman, RN, MSN, CDE Diabetes Coordinator Inpatient Diabetes Program [phone] (Team Pager)

## 2018-01-25 NOTE — Progress Notes (Signed)
  Echocardiogram 2D Echocardiogram has been performed.  Kenn Rekowski G Delawrence Fridman 01/25/2018, 9:45 AM

## 2018-01-25 NOTE — Evaluation (Signed)
Physical Therapy Evaluation Patient Details Name: Bill [name] MRN: [id number] DOB: [birth year] Today's Date: 01/25/2018   History of Present Illness  37 y.o. male with DM who does not have a PCP with significant family history of hypertension and diabetes mellitus presents to [name] ER after being sent by his ophthalmologist who diagnosed with retinal hemorrhages. MRI brain showed a small subacute infarct in the right peri-atrial area.    Clinical Impression  Mr. Bunyard is a pleasant 37 y/o male admitted with the above listed diagnosis. Patient reports that prior to admission he was independent with all aspects of mobility and worked full time in social work. Patient today requiring general supervision for bed level mobility and sit to stand at bed side. Mobility not progressed due to syncopal event at bedside requiring return to supine positioning. BP 115/79 prior to session, and 125/82 after return to supine. PT to continue to follow acutely to maximize safe and independent functional mobility.     Follow Up Recommendations No PT follow up;Supervision - Intermittent(will continue to assess as BP becomes stable)    Equipment Recommendations  None recommended by PT    Recommendations for Other Services OT consult     Precautions / Restrictions Precautions Precautions: Fall Precaution Comments: watch BP Restrictions Weight Bearing Restrictions: No      Mobility  Bed Mobility Overal bed mobility: Modified Independent                Transfers Overall transfer level: Needs assistance   Transfers: Sit to/from Stand Sit to Stand: Supervision         General transfer comment: close supervision due to lightheadedness/syncope  Ambulation/Gait             General Gait Details: deferred due to patient safety  Stairs            Wheelchair Mobility    Modified Rankin (Stroke Patients Only) Modified Rankin (Stroke Patients Only) Pre-Morbid Rankin  Score: No symptoms Modified Rankin: Moderately severe disability     Balance Overall balance assessment: (will further assess; syncopal event)                                           Pertinent Vitals/Pain Pain Assessment: No/denies pain    Home Living Family/patient expects to be discharged to:: Private residence Living Arrangements: Other relatives Available Help at Discharge: Available 24 hours/day Type of Home: House Home Access: Stairs to enter Entrance Stairs-Rails: Right;Left;Can reach both Entrance Stairs-Number of Steps: 3 Home Layout: Two level;Able to live on main level with bedroom/bathroom Home Equipment: None      Prior Function Level of Independence: Independent         Comments: works as a Education officer, 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; alot of computer work; Manufacturing engineer Dominance   Dominant Hand: Right    Extremity/Trunk Assessment   Upper Extremity Assessment Upper Extremity Assessment: Defer to OT evaluation    Lower Extremity Assessment Lower Extremity Assessment: Overall WFL for tasks assessed    Cervical / Trunk Assessment Cervical / Trunk Assessment: Normal  Communication   Communication: No difficulties  Cognition Arousal/Alertness: Awake/alert Behavior During Therapy: WFL for tasks assessed/performed Overall Cognitive Status: Within Functional Limits for tasks assessed  General Comments General comments (skin integrity, edema, etc.): sister present during session    Exercises     Assessment/Plan    PT Assessment Patient needs continued PT services  PT Problem List Decreased strength;Decreased activity tolerance;Decreased balance;Decreased mobility;Decreased knowledge of use of DME;Decreased safety awareness;Decreased knowledge of precautions       PT Treatment Interventions DME instruction;Gait  training;Functional mobility training;Therapeutic activities;Therapeutic exercise;Stair training;Balance training;Neuromuscular re-education;Patient/family education    PT Goals (Current goals can be found in the Care Plan section)  Acute Rehab PT Goals Patient Stated Goal: to return to work PT Goal Formulation: With patient Time For Goal Achievement: 02/08/18 Potential to Achieve Goals: Good    Frequency Min 4X/week   Barriers to discharge        Co-evaluation               AM-PAC PT "6 Clicks" Daily Activity  Outcome Measure Difficulty turning over in bed (including adjusting bedclothes, sheets and blankets)?: A Little Difficulty moving from lying on back to sitting on the side of the bed? : A Little Difficulty sitting down on and standing up from a chair with arms (e.g., wheelchair, bedside commode, etc,.)?: A Little Help needed moving to and from a bed to chair (including a wheelchair)?: A Little Help needed walking in hospital room?: Total Help needed climbing 3-5 steps with a railing? : Total 6 Click Score: 14    End of Session Equipment Utilized During Treatment: Gait belt Activity Tolerance: Patient tolerated treatment well Patient left: in bed;with call bell/phone within reach;with family/visitor present Nurse Communication: Mobility status PT Visit Diagnosis: Unsteadiness on feet (R26.81);Other abnormalities of gait and mobility (R26.89);Muscle weakness (generalized) (M62.81)    Time: 3267-1245 PT Time Calculation (min) (ACUTE ONLY): 12 min   Charges:   PT Evaluation $PT Eval Moderate Complexity: 1 Mod          Lanney Gins, PT, DPT 01/25/18 3:33 PM Pager: [phone]

## 2018-01-25 NOTE — Progress Notes (Signed)
OT Cancellation Note  Patient Details Name: Bill [name] MRN: [id number] DOB: Sep 13, 1980   Cancelled Treatment:    Reason Eval/Treat Not Completed: Patient at procedure or test/ unavailable  [facility], OT/L  OT Clinical Specialist [phone]  01/25/2018, 8:54 AM

## 2018-01-25 NOTE — Progress Notes (Signed)
Occupational Therapy Evaluation Patient Details Name: Bill [name] MRN: [id number] DOB: 1980-06-19 Today's Date: 01/25/2018    History of Present Illness 37 y.o. male with DM who does not have a PCP with significant family history of hypertension and diabetes mellitus presents to [name] ER after being sent by his ophthalmologist who diagnosed with retinal hemorrhages. MRI brain showed a small subacute infarct in the right peri-atrial area.   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 Session limited due to syncopal event. BP 115/ 79 prior to session; 125/ 82 supine after syncope. Nsg aware. Pt reports his vision as being"cloudy" and has an appointment with the Retina specialist. Will follow acutely and educate pt on compensatory strategies for low vision and provide information regarding community services for low vision.     Follow Up Recommendations  No OT follow up;Supervision - Intermittent    Equipment Recommendations  None recommended by OT    Recommendations for Other Services       Precautions / Restrictions Precautions Precaution Comments: watch BP      Mobility Bed Mobility Overal bed mobility: Modified Independent                Transfers Overall transfer level: Needs assistance   Transfers: Sit to/from Stand Sit to Stand: Supervision              Balance Overall balance assessment: (will further assess; syncopal event)                                         ADL either performed or assessed with clinical judgement   ADL Overall ADL's : Needs assistance/impaired                                     Functional mobility during ADLs: Min guard General ADL Comments: Pt able to comlete basic self care although need min guard A wat this time with functional  mobility dueto syncopal event     Vision Baseline Vision/History: No visual deficits Patient Visual Report: Blurring of vision Vision Assessment?: Yes Eye Alignment: Within Functional Limits Ocular Range of Motion: Within Functional Limits Alignment/Gaze Preference: Within Defined Limits Tracking/Visual Pursuits: Decreased smoothness of horizontal tracking;Decreased smoothness of vertical tracking Saccades: Overshoots Convergence: Within functional limits Visual Fields: No apparent deficits Additional Comments: reports seeing "spots;lights"     Perception     Praxis      Pertinent Vitals/Pain Pain Assessment: No/denies pain     Hand Dominance Right   Extremity/Trunk Assessment Upper Extremity Assessment Upper Extremity Assessment: Overall WFL for tasks assessed   Lower Extremity Assessment Lower Extremity Assessment: Defer to PT evaluation   Cervical / Trunk Assessment Cervical / Trunk Assessment: Normal   Communication Communication Communication: No difficulties   Cognition Arousal/Alertness: Awake/alert Behavior During Therapy: WFL for tasks assessed/performed Overall Cognitive Status: Within Functional Limits for tasks assessed                                     General Comments       Exercises  Shoulder Instructions      Home Living Family/patient expects to be discharged to:: Private residence Living Arrangements: Other relatives Available Help at Discharge: Available 24 hours/day Type of Home: House Home Access: Stairs to enter [employer] of Steps: 3 Entrance Stairs-Rails: Right;Left;Can reach both Home Layout: Two level;Able to live on main level with bedroom/bathroom     Bathroom Shower/Tub: Occupational psychologist: Standard Bathroom Accessibility: Yes How Accessible: Accessible via walker Home Equipment: None          Prior Functioning/Environment Level of Independence: Independent         Comments: works as a Education officer, 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; [employer] of computer work; drives        OT Problem List: Impaired vision/perception      OT Treatment/Interventions: Field seismologist;Therapeutic activities;Visual/perceptual remediation/compensation;Patient/family education    OT Goals(Current goals can be found in the care plan section) Acute Rehab OT Goals Patient Stated Goal: to return to work OT Goal Formulation: With patient Time For Goal Achievement: 02/08/18 Potential to Achieve Goals: Good  OT Frequency: Min 2X/week   Barriers to D/C:            Co-evaluation              AM-PAC PT "6 Clicks" Daily Activity     Outcome Measure Help from another person eating meals?: None Help from another person taking care of personal grooming?: None Help from another person toileting, which includes using toliet, bedpan, or urinal?: None Help from another person bathing (including washing, rinsing, drying)?: None Help from another person to put on and taking off regular upper body clothing?: None Help from another person to put on and taking off regular lower body clothing?: None 6 Click Score: 24   End of Session Equipment Utilized During Treatment: Gait belt Nurse Communication: Mobility status;Other (comment)(syncope event)  Activity Tolerance: Treatment limited secondary to medical complications (Comment)(syncope) Patient left: in bed;with call bell/phone within reach;with family/visitor present  OT Visit Diagnosis: Low vision, both eyes (H54.2)                Time: 7588-3254 OT Time Calculation (min): 11 min Charges:  OT General Charges $OT Visit: 1 Visit OT Evaluation $OT Eval Low Complexity: Harvey, OT/L  OT Clinical Specialist [phone]   [facility] 01/25/2018, 2:26 PM

## 2018-01-25 NOTE — Progress Notes (Signed)
Pt off unit to vascular. P. Amo Liane Tribbey. RN

## 2018-01-25 NOTE — Progress Notes (Signed)
Pt back to room from procedure; alert and verbally responsive. Delia Heady RN

## 2018-01-25 NOTE — H&P (Addendum)
History and Physical    Pace Lamadrid PTW:[id number] DOB: Mar 07, 1981 DOA: 01/24/2018  PCP: Patient, No Pcp Per  Patient coming from: Home.  Chief Complaint: Blurred vision.  HPI: Bill [name] is a 37 y.o. male with history of diabetes mellitus type 2 who has just recently moved from [place] last week to [facility] has been experiencing bilateral blurred vision over the last 1 week.  Denies any weakness of the upper or lower extremities.  Has been a throbbing headache in the right frontal area.  Has been examined nausea vomiting.  Patient states prior to moving to [facility] his primary care physician has noted that patient has been having high blood pressure and was not started on any antihypertensives.  Due to blurred vision patient had gone to ophthalmologist and was found to have retinal hemorrhages and was referred to the ER.  ED Course: In the ER patient blood pressure was more than 700 systolic.  MRI of the brain shows acute CVA.  On exam patient appears nonfocal.  Patient is able to read without difficulty.  Pupils were reacting to light.  On-call neurology has been consulted patient admitted for further management of acute stroke and hypertensive emergency.  Review of Systems: As per HPI, rest all negative.   Past Medical History:  Diagnosis Date  . Diabetes mellitus without complication [facility])     Past Surgical History:  Procedure Laterality Date  . arm surgery       reports that he has never smoked. He has never used smokeless tobacco. He reports that he drinks alcohol. His drug history is not on file.  No Known Allergies  Family History  Problem Relation Age of Onset  . Diabetes Mellitus II Mother   . Hypertension Mother   . Diabetes Mellitus II Father   . Stroke Father   . Hypertension Father     Prior to Admission medications   Medication Sig Start Date End Date Taking? Authorizing Provider  Empagliflozin-metFORMIN HCl (SYNJARDY) 5-500 MG TABS Take 1 tablet  by mouth daily.   Yes [provider]  ibuprofen (ADVIL,MOTRIN) 200 MG tablet Take 200 mg by mouth every 6 (six) hours as needed for headache.   Yes [provider]  hydrochlorothiazide (HYDRODIURIL) 25 MG tablet Take 1 tablet (25 mg total) by mouth daily. 01/24/18 02/23/18  Margette Fast, MD    Physical Exam: Vitals:   01/25/18 0000 01/25/18 0030 01/25/18 0130 01/25/18 0216  BP: (!) 180/99 (!) 156/94 (!) 165/92 (!) 193/97  Pulse:  91 82 88  Resp: 20 19 (!) 21 18  Temp:    98.7 F (37.1 C)  TempSrc:    Oral  SpO2:    100%  Weight:      Height:          Constitutional: Moderately built and nourished. Vitals:   01/25/18 0000 01/25/18 0030 01/25/18 0130 01/25/18 0216  BP: (!) 180/99 (!) 156/94 (!) 165/92 (!) 193/97  Pulse:  91 82 88  Resp: 20 19 (!) 21 18  Temp:    98.7 F (37.1 C)  TempSrc:    Oral  SpO2:    100%  Weight:      Height:       Eyes: Anicteric no pallor. ENMT: No discharge from the ears eyes nose or mouth. Neck: No mass felt.  No neck rigidity.  No JVD appreciated. Respiratory: No rhonchi or crepitations. Cardiovascular: S1-S2 heard no murmurs appreciated Abdomen: Soft nontender bowel sounds present. Musculoskeletal: No  edema.  No joint effusion. Skin: No rash.  Skin appears warm. Neurologic: Alert awake oriented to time place and person.  Moves all extremities 5 x 5.  No facial asymmetry tongue is midline.  Pupils equal and reacting to light. Psychiatric: Appears normal per normal affect.   Labs on Admission: I have personally reviewed following labs and imaging studies  CBC: Recent Labs  Lab 01/24/18 1807  WBC 4.4  NEUTROABS 2.3  HGB 10.6*  HCT 33.5*  MCV 80.9  PLT 741   Basic Metabolic Panel: Recent Labs  Lab 01/24/18 1807  NA 141  K 4.9  CL 114*  CO2 19*  GLUCOSE 147*  BUN 29*  CREATININE 1.19  CALCIUM 8.9   GFR: Estimated Creatinine Clearance: 86.9 mL/min (by C-G formula based on SCr of 1.19 mg/dL). Liver  Function Tests: Recent Labs  Lab 01/24/18 1807  AST 14*  ALT 13  ALKPHOS 39  BILITOT 0.6  PROT 6.4*  ALBUMIN 3.2*   No results for input(s): LIPASE, AMYLASE in the last 168 hours. No results for input(s): AMMONIA in the last 168 hours. Coagulation Profile: Recent Labs  Lab 01/24/18 1807  INR 0.95   Cardiac Enzymes: Recent Labs  Lab 01/24/18 1807  TROPONINI <0.03   BNP (last 3 results) No results for input(s): PROBNP in the last 8760 hours. HbA1C: No results for input(s): HGBA1C in the last 72 hours. CBG: No results for input(s): GLUCAP in the last 168 hours. Lipid Profile: No results for input(s): CHOL, HDL, LDLCALC, TRIG, CHOLHDL, LDLDIRECT in the last 72 hours. Thyroid Function Tests: No results for input(s): TSH, T4TOTAL, FREET4, T3FREE, THYROIDAB in the last 72 hours. Anemia Panel: No results for input(s): VITAMINB12, FOLATE, FERRITIN, TIBC, IRON, RETICCTPCT in the last 72 hours. Urine analysis:    Component Value Date/Time   COLORURINE YELLOW (A) 01/24/2018 1813   APPEARANCEUR CLEAR (A) 01/24/2018 1813   LABSPEC >1.030 (H) 01/24/2018 1813   PHURINE 5.5 01/24/2018 1813   GLUCOSEU NEGATIVE 01/24/2018 1813   HGBUR LARGE (A) 01/24/2018 1813   BILIRUBINUR NEGATIVE 01/24/2018 1813   KETONESUR NEGATIVE 01/24/2018 1813   PROTEINUR 100 (A) 01/24/2018 1813   NITRITE NEGATIVE 01/24/2018 1813   LEUKOCYTESUR NEGATIVE 01/24/2018 1813   Sepsis Labs: @LABRCNTIP (procalcitonin:4,lacticidven:4) )No results found for this or any previous visit (from the past 240 hour(s)).   Radiological Exams on Admission: Ct Head Wo Contrast  Result Date: 01/24/2018 CLINICAL DATA:  Blurred vision. Told he had blood behind retina. High blood pressure. Diabetes. EXAM: CT HEAD WITHOUT CONTRAST TECHNIQUE: Contiguous axial images were obtained from the base of the skull through the vertex without intravenous contrast. COMPARISON:  None. FINDINGS: Brain: No mass lesion, hemorrhage,  hydrocephalus, acute infarct, intra-axial, or extra-axial fluid collection. Vascular: No hyperdense vessel or unexpected calcification. Skull: Normal. Sinuses/Orbits: Normal appearance of the orbits and globes. Minimal left maxillary sinus mucosal thickening. Clear mastoid air cells. Other: None. IMPRESSION: 1.  No acute intracranial abnormality. 2. Minimal sinus disease. Electronically Signed   By: Abigail Miyamoto M.D.   On: 01/24/2018 19:19   Mr Brain Wo Contrast  Result Date: 01/24/2018 CLINICAL DATA:  Migraine and headache. Retinal bleed noted at eye doctor today. Hypertensive. History of diabetes. EXAM: MRI HEAD WITHOUT CONTRAST TECHNIQUE: Coronal and axial diffusion weighted imaging. Patient could not complete examination due to reported claustrophobia, patient received medication. COMPARISON:  CT HEAD January 24, 2018 FINDINGS: Brain: Subcentimeter RIGHT periatrial white matter reduced diffusion with low ADC values. No midline shift  or mass effect. No parenchymal brain volume loss for age. No hydrocephalus. Vascular: Nondiagnostic assessment. Skull and upper cervical spine: Nondiagnostic assessment. No reduced diffusion to suggest bone lesion. Sinuses/Orbits: Nondiagnostic assessment. Other: IMPRESSION: 1. Limited 2 sequence MRI head: Acute RIGHT periatrial subcentimeter nonhemorrhagic infarct, in region of optic tract. Electronically Signed   By: Elon Alas M.D.   On: 01/24/2018 23:15    EKG: Independently reviewed: Normal sinus rhythm with nonspecific ST-T changes.  Assessment/Plan Principal Problem:   Acute CVA (cerebrovascular accident) [facility]) Active Problems:   Hypertensive emergency   Acute non intractable tension-type headache   Type 2 diabetes mellitus with vascular disease ([place])    1. Acute CVA seen on MRI brain -discussed with neurologist.  At this time patient is placed on neurochecks passed swallow evaluation.  MRA brain 2D echo carotid Doppler ordered.  Aspirin on hold due to  retinal hemorrhages.  Get physical therapy consult. 2. Hypertensive emergency likely causing some of patient's symptoms including headache and blurred vision.  Patient is placed on IV hydralazine for now.  Will slowly decrease blood pressure over the next few days.  Will start definite antihypertensives if patient blood pressure remains high in the morning.  Awaiting neurology recommendations. 3. Diabetes mellitus type 2 on sliding scale coverage while inpatient.  Check hemoglobin A1c. 4. Normocytic normochromic anemia we will check anemia panel.   DVT prophylaxis: SCDs due to uncontrolled blood pressure and retinal hemorrhages. Code Status: Full code. Family Communication: Discussed with patient. Disposition Plan: Home. Consults called: Neurology. Admission status: Observation.   Rise Patience MD [facility] Pager [phone].  If 7PM-7AM, please contact night-coverage [email or web address] Password [email or web address]  01/25/2018, 3:31 AM

## 2018-01-25 NOTE — Progress Notes (Signed)
Initial Nutrition Assessment  DOCUMENTATION CODES:   Not applicable  INTERVENTION:   - Glucerna Shake po BID, each supplement provides 220 kcal and 10 grams of protein  - Encourage adequate PO intake  NUTRITION DIAGNOSIS:   Inadequate oral intake related to decreased appetite as evidenced by per patient/family report.  GOAL:   Patient will meet greater than or equal to 90% of their needs  MONITOR:   PO intake, Supplement acceptance, Labs, Weight trends  REASON FOR ASSESSMENT:   Malnutrition Screening Tool    ASSESSMENT:   37 year old male who presented to the ED with hypertension. PMH significant for type 2 diabetes mellitus. MRI showed an acute R periatrial subcentimeter nonhemorrhagic infarct in the region of the optic track.  Spoke with pt and sister at bedside.  Pt reports that his current nutrition status has been affected by "complications with diabetes and hypertension." Pt endorses a loss of appetite for "a couple of days." Pt reports that he usually has a good appetite and eats 2-3 meals daily but that he did not have anything to eat yesterday. Pt reports that he is "conscientious about my blood sugars." Pt reports that if he is "doing what I should, my sugars are in the mid-140's."  Dietary Recall: Breakfast: oatmeal and blueberries Lunch: salad Dinner: "varies," chicken or fish  Pt reports he did not eat much lunch because it was not his taste preference. Pt states he had an Ensure Enlive earlier and is agreeable to Glucerna oral nutrition supplements during admission.  Pt endorses recent weight loss, stating that his UBW is 200 lbs and that he last weighed this in January 2019. Pt believes that some of his weight loss can be attributed to "cleaning up my diet" but that most of it has been unintentional. No weight history available in chart so unable to confirm recent weight loss.  Medications reviewed and include: sliding scale Novolog  Labs reviewed: BUN  22 (H), cholesterol 244 (H), HDL 36 (L), LDL 189 (H) CBG's: 202, 146, 127  NUTRITION - FOCUSED PHYSICAL EXAM:    Most Recent Value  Upper Arm Region  Mild depletion  Thoracic and Lumbar Region  No depletion  Buccal Region  No depletion  Temple Region  Mild depletion  Clavicle Bone Region  No depletion  Clavicle and Acromion Bone Region  Mild depletion  Scapular Bone Region  Unable to assess  Dorsal Hand  No depletion  Patellar Region  Mild depletion  Anterior Thigh Region  Mild depletion  Posterior Calf Region  No depletion  Edema (RD Assessment)  None  Hair  Reviewed  Eyes  Reviewed  Mouth  Reviewed  Skin  Reviewed  Nails  Reviewed       Diet Order:   Diet Order            Diet heart healthy/carb modified Room service appropriate? Yes; Fluid consistency: Thin  Diet effective now              EDUCATION NEEDS:   No education needs have been identified at this time  Skin:  Skin Assessment: Reviewed RN Assessment  Last BM:  01/24/18  Height:   Ht Readings from Last 1 Encounters:  01/24/18 5\' 10"  (1.778 m)    Weight:   Wt Readings from Last 1 Encounters:  01/24/18 85.7 kg    Ideal Body Weight:  75.45 kg  BMI:  Body mass index is 27.12 kg/m.  Estimated Nutritional Needs:   Kcal:  2200-2400 kcal  Protein:  110-125 grams  Fluid:  >/= 2.2 L    Gaynell Face, MS, RD, LDN Pager: [phone] Weekend/After Hours: [phone]

## 2018-01-25 NOTE — Progress Notes (Addendum)
STROKE TEAM PROGRESS NOTE   INTERVAL HISTORY No family is at the bedside.  He remains neuro stable. Blurry vision unchanged. No new deficits.he is rightfully "scared and nervous" based on events of past 24h.  He recently moved from [place] to [place]. He drove, stopping regularly for breaks.   Vitals:   01/25/18 0216 01/25/18 0400 01/25/18 0600 01/25/18 0814  BP: (!) 193/97 (!) 157/103 (!) 146/95   Pulse: 88 81 87 85  Resp: 18 18 18 20   Temp: 98.7 F (37.1 C) 98.2 F (36.8 C) 98.1 F (36.7 C) 98.7 F (37.1 C)  TempSrc: Oral Oral Oral Oral  SpO2: 100% 99%    Weight:      Height:        CBC:  Recent Labs  Lab 01/24/18 1807 01/25/18 0400  WBC 4.4 4.2  NEUTROABS 2.3  --   HGB 10.6* 10.4*  HCT 33.5* 33.4*  MCV 80.9 79.7  PLT 194 081    Basic Metabolic Panel:  Recent Labs  Lab 01/24/18 1807 01/25/18 0400  NA 141 143  K 4.9 4.6  CL 114* 111  CO2 19* 25  GLUCOSE 147* 144*  BUN 29* 22*  CREATININE 1.19 1.11  CALCIUM 8.9 9.3   Lipid Panel:     Component Value Date/Time   CHOL 244 (H) 01/25/2018 0400   TRIG 97 01/25/2018 0400   HDL 36 (L) 01/25/2018 0400   CHOLHDL 6.8 01/25/2018 0400   VLDL 19 01/25/2018 0400   LDLCALC 189 (H) 01/25/2018 0400   HgbA1c:  Lab Results  Component Value Date   HGBA1C 10.4 (H) 01/25/2018   Urine Drug Screen: No results found for: LABOPIA, COCAINSCRNUR, LABBENZ, AMPHETMU, THCU, LABBARB  Alcohol Level No results found for: [facility]  IMAGING Ct Head Wo Contrast  Result Date: 01/24/2018 CLINICAL DATA:  Blurred vision. Told he had blood behind retina. High blood pressure. Diabetes. EXAM: CT HEAD WITHOUT CONTRAST TECHNIQUE: Contiguous axial images were obtained from the base of the skull through the vertex without intravenous contrast. COMPARISON:  None. FINDINGS: Brain: No mass lesion, hemorrhage, hydrocephalus, acute infarct, intra-axial, or extra-axial fluid collection. Vascular: No hyperdense vessel or unexpected calcification. Skull: Normal.  Sinuses/Orbits: Normal appearance of the orbits and globes. Minimal left maxillary sinus mucosal thickening. Clear mastoid air cells. Other: None. IMPRESSION: 1.  No acute intracranial abnormality. 2. Minimal sinus disease. Electronically Signed   By: Abigail Miyamoto M.D.   On: 01/24/2018 19:19   Mr Brain Wo Contrast  Result Date: 01/24/2018 CLINICAL DATA:  Migraine and headache. Retinal bleed noted at eye doctor today. Hypertensive. History of diabetes. EXAM: MRI HEAD WITHOUT CONTRAST TECHNIQUE: Coronal and axial diffusion weighted imaging. Patient could not complete examination due to reported claustrophobia, patient received medication. COMPARISON:  CT HEAD January 24, 2018 FINDINGS: Brain: Subcentimeter RIGHT periatrial white matter reduced diffusion with low ADC values. No midline shift or mass effect. No parenchymal brain volume loss for age. No hydrocephalus. Vascular: Nondiagnostic assessment. Skull and upper cervical spine: Nondiagnostic assessment. No reduced diffusion to suggest bone lesion. Sinuses/Orbits: Nondiagnostic assessment. Other: IMPRESSION: 1. Limited 2 sequence MRI head: Acute RIGHT periatrial subcentimeter nonhemorrhagic infarct, in region of optic tract. Electronically Signed   By: Elon Alas M.D.   On: 01/24/2018 23:15   2D Echocardiogram  pending   Carotid Doppler   There is 1-39% bilateral ICA stenosis. Vertebral artery flow is antegrade.    PHYSICAL EXAM General: Appears well-developed and well-nourished.  Psych: Affect appropriate to situation Eyes:  No scleral injection HENT: No OP obstrucion Head: Normocephalic.  Cardiovascular: Normal rate and regular rhythm.  Respiratory: Effort normal and breath sounds normal to anterior ascultation Skin: WDI  Neurological Examination Mental Status: Alert, oriented, thought content appropriate. Speech fluent without evidence of aphasia. Able to follow 3 step commands without difficulty. Cranial Nerves: II: Visual  fields grossly normal - can count fingers and note movement in all peripheral fields III,IV, VI: ptosis not present, extra-ocular motions intact bilaterally, pupils equal, round, reactive to light and accommodation V,VII: smile symmetric, facial light touch sensation normal bilaterally VIII: hearing normal bilaterally IX,X: uvula rises symmetrically XI: bilateral shoulder shrug XII: midline tongue extension Motor: Right :  Upper extremity   5/5                                      Left:     Upper extremity   5/5             Lower extremity   5/5                                                  Lower extremity   5/5 Tone and bulk:normal tone throughout; no atrophy noted Sensory: Pinprick and light touch intact throughout, bilaterally Deep Tendon Reflexes: 2+ and symmetric throughout Plantars: Right: downgoing                                Left: downgoing Cerebellar: normal finger-to-nose, normal rapid alternating movements and normal heel-to-shin test Gait: deferred   ASSESSMENT/PLAN [name] is a 37 y.o. male with history of HTN, DB presenting with new retinal bleed, HA, blurry vision and hypertensive emergency.  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   MRI  Acute tiny R periatrial infarct  Carotid Doppler  B ICA 1-39% stenosis, VAs antegrade   2D Echo  pending   LDL 189  HgbA1c 10.4  SCDs for VTE prophylaxis  No antithrombotic prior to admission, now on No antithrombotic (on hold d/t retinal hemorrhage). Recommend addition of aspirin 81 mg daily once cleared by retinal specialist/opthamology  Therapy recommendations:  Pending. No anticipate therapy needs  Disposition:  Return home w/ family Follow-up Stroke Clinic at [facility] in 4 weeks. Office will call with appointment date and time. Order placed.  Hypertensive Emergency  BP > 200 on arrival  Improved this  am . BP goal normotensive  Hyperlipidemia  Home meds:  No statin  Now on lipitor 80  LDL 189, goal < 70  Continue statin at discharge  Diabetes type II  HgbA1c 10.4, goal < 7.0  Uncontrolled  Other Stroke Risk Factors  ETOH use, advised to drink no more than 2 drink(s) a day  Advised smoking increases risk for stroke.  Family hx stroke (father)  Other Active Problems  Normocytic normochromic anemia 10.4  Cr.11  Hospital day # 0  Burnetta Sabin, MSN, APRN, ANVP-BC, AGPCNP-BC Advanced Practice Stroke Nurse [place] for Schedule & Pager information 01/25/2018 9:54 AM   ATTENDING NOTE: I reviewed above note and agree with the assessment and plan.  I have made any additions or clarifications directly to the above note. Pt was seen and examined.   37 year old male with history of hypertension, diabetes admitted for headache, blurry vision bilaterally.  He went to see his eye doctor, and diagnosed with retinal hemorrhage bilaterally.  He was also sent to ER for further evaluation and MRI showed left punctate periventricular infarct.  Patient stroke cannot explain the patient clinical presentation, likely incidental finding.  Dr. Lorraine Lax has recommended limited stroke work-up.  Carotid Doppler negative and EF 65 to 70%.  LDL 189 and A1c 10.4.  UDS positive for barbiturate and benzo.  His stroke most likely due to small vessel disease given uncontrolled stroke risk factors.  Because of retinal hemorrhage, Dr. Lorraine Lax has recommended hold off aspirin.  Okay to continue Lipitor for HLD and stroke prevention.  Patient needs to follow-up with his ophthalmologist closely and consider to start aspirin 81 once cleared by ophthalmology.  Neurology will sign off. Please call with questions. Pt will follow up with stroke clinic NP at [facility] in about 4 weeks. Thanks for the consult.   Rosalin Hawking, MD PhD Stroke Neurology 01/25/2018 5:44 PM     To contact Stroke  Continuity provider, please refer to [email or web address] After hours, contact General Neurology

## 2018-01-25 NOTE — Progress Notes (Addendum)
37 year old male history of diabetes past year who presents with blurred vision over the past week and went to [facility] ophthalmology [Dr.Hallanan] found to have retinal hemorrhages sent in for evaluation of the same Found to have an acute stroke Work-up in process Unclear if further work-up needs to be done in terms of autoimmune, TEE-defer to neurology I have attempted to contact ophthalmology to see when is the most appropriate time to restart aspirin and have not received a call back-their number is [phone]  Addend-d/w Dr.Weaver of Optho Recommneds that the patient take aspirin, as they can manage issues such as vitreous hemorrhage if prn.

## 2018-01-25 NOTE — Consult Note (Signed)
Requesting Physician: Dr. Hal Hope    Chief Complaint: headache, blurry vision  History obtained from: Patient and Chart   HPI:                                                                                                                                       Bill [name] is an 37 y.o. male with DM who does not have a PCP with significant family history of hypertension and diabetes mellitus presents to [name] ER after being sent by his ophthalmologist who diagnosed with retinal hemorrhages.  His blood pressure was apparently high in the eye clinic and was advised to go to the emergency room.  In the emergency room an MRI brain was ordered as well as MRI orbits.  MRI brain showed a small subacute infarct in the right peri-atrial area.  He denies history of migraines.  Unaware that he high blood pressure.  He describes his headaches lasting for most of the day, not associated nausea vomiting.  Headache is bifrontal.  Blurring of vision has remained constant since about 2 weeks.   Date last known well: 2 weeks ago tPA Given: outside window NIHSS: 0 Baseline MRS  0   Past Medical History:  Diagnosis Date  . Diabetes mellitus without complication [facility])     Past Surgical History:  Procedure Laterality Date  . arm surgery      Family History  Problem Relation Age of Onset  . Diabetes Mellitus II Mother   . Hypertension Mother   . Diabetes Mellitus II Father   . Stroke Father   . Hypertension Father    Social History:  reports that he has never smoked. He has never used smokeless tobacco. He reports that he drinks alcohol. His drug history is not on file.  Allergies: No Known Allergies  Medications:                                                                                                                        I reviewed home medications   ROS:  14 systems reviewed and negative except above    Examination:                                                                                                      General: Appears well-developed and well-nourished.  Psych: Affect appropriate to situation Eyes: No scleral injection HENT: No OP obstrucion Head: Normocephalic.  Cardiovascular: Normal rate and regular rhythm.  Respiratory: Effort normal and breath sounds normal to anterior ascultation GI: Soft.  No distension. There is no tenderness.  Skin: WDI    Neurological Examination Mental Status: Alert, oriented, thought content appropriate.  Speech fluent without evidence of aphasia. Able to follow 3 step commands without difficulty. Fundus examination: No obvious papilledema noted, difficult top assess retina in undilated exam Cranial Nerves: II: Visual fields grossly normal,  III,IV, VI: ptosis not present, extra-ocular motions intact bilaterally, pupils equal, round, reactive to light and accommodation V,VII: smile symmetric, facial light touch sensation normal bilaterally VIII: hearing normal bilaterally IX,X: uvula rises symmetrically XI: bilateral shoulder shrug XII: midline tongue extension Motor: Right : Upper extremity   5/5    Left:     Upper extremity   5/5  Lower extremity   5/5     Lower extremity   5/5 Tone and bulk:normal tone throughout; no atrophy noted Sensory: Pinprick and light touch intact throughout, bilaterally Deep Tendon Reflexes: 2+ and symmetric throughout Plantars: Right: downgoing   Left: downgoing Cerebellar: normal finger-to-nose, normal rapid alternating movements and normal heel-to-shin test Gait: normal gait and station     Lab Results: Basic Metabolic Panel: Recent Labs  Lab 01/24/18 1807 01/25/18 0400  NA 141 143  K 4.9 4.6  CL 114* 111  CO2 19* 25  GLUCOSE 147* 144*  BUN 29* 22*  CREATININE 1.19 1.11  CALCIUM 8.9 9.3    CBC: Recent Labs  Lab 01/24/18 1807  01/25/18 0400  WBC 4.4 4.2  NEUTROABS 2.3  --   HGB 10.6* 10.4*  HCT 33.5* 33.4*  MCV 80.9 79.7  PLT 194 181    Coagulation Studies: Recent Labs    01/24/18 1807  LABPROT 12.6  INR 0.95    Imaging: Ct Head Wo Contrast  Result Date: 01/24/2018 CLINICAL DATA:  Blurred vision. Told he had blood behind retina. High blood pressure. Diabetes. EXAM: CT HEAD WITHOUT CONTRAST TECHNIQUE: Contiguous axial images were obtained from the base of the skull through the vertex without intravenous contrast. COMPARISON:  None. FINDINGS: Brain: No mass lesion, hemorrhage, hydrocephalus, acute infarct, intra-axial, or extra-axial fluid collection. Vascular: No hyperdense vessel or unexpected calcification. Skull: Normal. Sinuses/Orbits: Normal appearance of the orbits and globes. Minimal left maxillary sinus mucosal thickening. Clear mastoid air cells. Other: None. IMPRESSION: 1.  No acute intracranial abnormality. 2. Minimal sinus disease. Electronically Signed   By: Abigail Miyamoto M.D.   On: 01/24/2018 19:19   Mr Brain Wo Contrast  Result Date: 01/24/2018 CLINICAL DATA:  Migraine and headache. Retinal bleed noted at eye doctor today. Hypertensive. History of diabetes. EXAM: MRI HEAD WITHOUT CONTRAST TECHNIQUE: Coronal and axial diffusion weighted  imaging. Patient could not complete examination due to reported claustrophobia, patient received medication. COMPARISON:  CT HEAD January 24, 2018 FINDINGS: Brain: Subcentimeter RIGHT periatrial white matter reduced diffusion with low ADC values. No midline shift or mass effect. No parenchymal brain volume loss for age. No hydrocephalus. Vascular: Nondiagnostic assessment. Skull and upper cervical spine: Nondiagnostic assessment. No reduced diffusion to suggest bone lesion. Sinuses/Orbits: Nondiagnostic assessment. Other: IMPRESSION: 1. Limited 2 sequence MRI head: Acute RIGHT periatrial subcentimeter nonhemorrhagic infarct, in region of optic tract. Electronically  Signed   By: Elon Alas M.D.   On: 01/24/2018 23:15     ASSESSMENT AND PLAN   Punctate Subcortical infarct - likely small vessel disease due to uncontrolled HTN HTN emergency Retinal hemorrhages    Risk factors :HTN, DM Etiology: small vessel disease   Limited Stroke workup # Carotid doppler  #Transthoracic Echo  # Hold ASA due to retinal hemorrhage #Start or continue Atorvastatin 80 mg/other high intensity statin # BP goal:  Gradual reduction to normotension # HBAIC and Lipid profile # Telemetry monitoring # Frequent neuro checks    HTN Emergency -His blood pressure by about 25% -Normotension 24 to 48 hours  Retinal hemorrhage -Reschedule appointment with retina specialist later today if blood pressure is controlled   Please page stroke NP  Or  PA  Or MD from 8am -4 pm  as this patient from this time will be  followed by the stroke.   You can look them up on [email or web address]  Password [facility]     Kierra Jezewski [facility] Pager Number [phone]

## 2018-01-25 NOTE — Progress Notes (Signed)
VASCULAR LAB PRELIMINARY  PRELIMINARY  PRELIMINARY  PRELIMINARY  Carotid duplex completed.    Preliminary report:  1-39% ICA plaquing. Vertebral artery flow is antegrade.   Bill [name], RVT 01/25/2018, 8:58 AM

## 2018-01-25 NOTE — Plan of Care (Signed)
  Problem: Activity Intolerance/Impaired Mobility Goal: Knowledge of disease or condition and therapeutic regimen will improve Outcome: Progressing   Problem: Problem: Neuro Progression Goal: Understanding of disease or condition will improve Outcome: Progressing

## 2018-01-25 NOTE — Progress Notes (Signed)
PT Cancellation Note  Patient Details Name: Bill [name] MRN: [id number] DOB: 1981/05/18   Cancelled Treatment:    Reason Eval/Treat Not Completed: Patient at procedure or test/unavailable (vascular). Will follow up as time allows  Lanney Gins, PT, DPT 01/25/18 8:21 AM Pager: [phone]

## 2018-01-26 ENCOUNTER — Encounter: Payer: Self-pay | Admitting: Family Medicine

## 2018-01-26 ENCOUNTER — Encounter (INDEPENDENT_AMBULATORY_CARE_PROVIDER_SITE_OTHER): Payer: Self-pay | Admitting: Ophthalmology

## 2018-01-26 LAB — GLUCOSE, CAPILLARY
Glucose-Capillary: 172 mg/dL — ABNORMAL HIGH (ref 70–99)
Glucose-Capillary: 188 mg/dL — ABNORMAL HIGH (ref 70–99)

## 2018-01-26 MED ORDER — ATORVASTATIN CALCIUM 80 MG PO TABS: 80.0000 mg | ORAL_TABLET | Freq: Every day | ORAL | 12 refills | Status: DC

## 2018-01-26 MED ORDER — ASPIRIN 81 MG PO TBEC: 81.0000 mg | DELAYED_RELEASE_TABLET | Freq: Every day | ORAL | 11 refills | Status: DC

## 2018-01-26 MED ORDER — AMLODIPINE BESYLATE 10 MG PO TABS: 10.0000 mg | ORAL_TABLET | Freq: Every day | ORAL | 11 refills | Status: DC

## 2018-01-26 NOTE — Progress Notes (Signed)
Physical Therapy Treatment Patient Details Name: Bill [name] MRN: [id number] DOB: Dec 20, 1980 Today's Date: 01/26/2018    History of Present Illness 37 y.o. male with DM who does not have a PCP with significant family history of hypertension and diabetes mellitus presents to [name] ER after being sent by his ophthalmologist who diagnosed with retinal hemorrhages. MRI brain showed a small subacute infarct in the right peri-atrial area.    PT Comments    Patient is making good progress with PT.  From a mobility standpoint anticipate patient will be ready for DC home when medically ready.    Follow Up Recommendations  No PT follow up;Supervision - Intermittent     Equipment Recommendations  None recommended by PT    Recommendations for Other Services OT consult     Precautions / Restrictions Precautions Precautions: Fall Precaution Comments: watch BP Restrictions Weight Bearing Restrictions: No    Mobility  Bed Mobility Overal bed mobility: Independent                Transfers Overall transfer level: Modified independent   Transfers: Sit to/from Stand              Ambulation/Gait Ambulation/Gait assistance: Supervision Gait Distance (Feet): 300 Feet Assistive device: None Gait Pattern/deviations: Step-through pattern         Stairs Stairs: Yes Stairs assistance: Supervision Stair Management: No rails;Alternating pattern;Forwards Number of Stairs: 3     Wheelchair Mobility    Modified Rankin (Stroke Patients Only) Modified Rankin (Stroke Patients Only) Pre-Morbid Rankin Score: No symptoms Modified Rankin: Slight disability     Balance Overall balance assessment: No apparent balance deficits (not formally assessed)                                          Cognition Arousal/Alertness: Awake/alert Behavior During Therapy: WFL for tasks assessed/performed Overall Cognitive Status: Within Functional Limits for  tasks assessed                                        Exercises      General Comments        Pertinent Vitals/Pain      Home Living                      Prior Function            PT Goals (current goals can now be found in the care plan section) Acute Rehab PT Goals Patient Stated Goal: to return to work PT Goal Formulation: With patient Time For Goal Achievement: 02/08/18 Potential to Achieve Goals: Good Progress towards PT goals: Progressing toward goals    Frequency    Min 4X/week      PT Plan Current plan remains appropriate    Co-evaluation              AM-PAC PT "6 Clicks" Daily Activity  Outcome Measure  Difficulty turning over in bed (including adjusting bedclothes, sheets and blankets)?: None Difficulty moving from lying on back to sitting on the side of the bed? : None Difficulty sitting down on and standing up from a chair with arms (e.g., wheelchair, bedside commode, etc,.)?: A Little Help needed moving to and from a bed to chair (including a wheelchair)?: None Help needed  walking in hospital room?: A Little Help needed climbing 3-5 steps with a railing? : A Little 6 Click Score: 21    End of Session Equipment Utilized During Treatment: Gait belt Activity Tolerance: Patient tolerated treatment well Patient left: with call bell/phone within reach;in chair Nurse Communication: Mobility status PT Visit Diagnosis: Unsteadiness on feet (R26.81);Other abnormalities of gait and mobility (R26.89);Muscle weakness (generalized) (M62.81)     Time: 4982-6415 PT Time Calculation (min) (ACUTE ONLY): 17 min  Charges:  $Gait Training: 8-22 mins                     Earney Navy, PTA Pager: [phone]     Darliss Cheney 01/26/2018, 2:05 PM

## 2018-01-26 NOTE — Care Management Note (Signed)
Case Management Note  Patient Details  Name: Bill [name] MRN: [id number] Date of Birth: [birth year]  Subjective/Objective:    DM, HTN, blurred vision, retinal hemorrhage                Action/Plan: NCM spoke to pt and states he just started his new job. Will need a note for work. Made attending aware. Scheduled appt for [facility] for 8/30 at 1:30 pm. Pt can utilize [facility] for best price or goodrx to obtain meds. States he can afford meds. States he will have insurance with his new job.   Expected Discharge Date:  01/26/18               Expected Discharge Plan:  Home/Self Care  In-House Referral:  NA  Discharge planning Services  CM Consult, Medication Assistance, Follow-up appt scheduled  Post Acute Care Choice:  NA Choice offered to:  NA  DME Arranged:  N/A DME Agency:  NA  HH Arranged:  NA HH Agency:  NA  Status of Service:  Completed, signed off  If discussed at [place] of Stay Meetings, dates discussed:    Additional Comments:  Erenest Rasher, RN 01/26/2018, 10:22 AM

## 2018-01-26 NOTE — Discharge Summary (Signed)
Physician Discharge Summary  Ariyan Brisendine OHY:[id number] DOB: 1980-11-20 DOA: 01/24/2018  PCP: Patient, No Pcp Per  Admit date: 01/24/2018 Discharge date: 01/26/2018  Time spent: 25 minutes  Recommendations for Outpatient Follow-up:  1. Will need outpatient follow-up with ophthalmologist Hallahan--- has been okayed for aspirin use by her partner 2. Prescriptions given for statin, aspirin, other medications in addition to blood pressure medications on discharge 3. Will need 4 to 6-week follow-up for stroke  Discharge Diagnoses:  Principal Problem:   Acute CVA (cerebrovascular accident) [facility]) Active Problems:   Hypertensive emergency   Acute non intractable tension-type headache   Type 2 diabetes mellitus with vascular disease ([place])   Discharge Condition: Improved  Diet recommendation: Heart healthy diabetic  Filed Weights   01/24/18 1626  Weight: 85.7 kg    History of present illness:  37 year old male with long-standing diabetes over 10 years uncontrolled hypertension presented with hypertensive urgency from ophthalmology office and found to have retinal hemorrhages-not felt by neurology on work-up in the emergency room to have had more hypertensive associated headaches and blurring of vision-neurology recommended limited stroke work-up which was done including negative carotid Doppler transthoracic echo showing EF of 55%-60% with grade 1 diastolic dysfunction (compensated without acute component) Hyperlipidemia LDL of 189 and total cholesterol 244 for which she was prescribed a statin and was placed after discussion with neurology and ophthalmology on aspirin-I have reiterated clearly to him he will need to have close follow-up-Case manager will set him up with an outpatient appointment-he will need to follow-up with his ophthalmologist at the earliest but was stabilized for discharge home Patient was given letter for work in addition to letter for school and  stabilized    Discharge Exam: Vitals:   01/26/18 0359 01/26/18 0831  BP: (!) 147/87 (!) 170/98  Pulse: 98 85  Resp: 18 20  Temp: 98.7 F (37.1 C) 98.1 F (36.7 C)  SpO2: 100% 98%    General: Awake alert pleasant no distress no focal deficit blurred vision still about the same without further deterioration Cardiovascular: S1-S2 no murmur rub or gallop Respiratory: Clinically clear Neurologically intact power 5/5, sensory grossly intact vision by direct confrontation intact  Discharge Instructions   Discharge Instructions    Ambulatory referral to Neurology   Complete by:  As directed    Follow up with stroke clinic NP (Jessica Vanschaick or Cecille Rubin, if both not available, consider Dr. Antony Contras, Dr. Bess Harvest, or Dr. Sarina Ill) at [facility] in about 4 weeks.   Diet - low sodium heart healthy   Complete by:  As directed    Discharge instructions   Complete by:  As directed    Please take all blood pressure meds You may take aspirin 81 mg daily but you will need follow up with Dr. [name] your Opthalmologist Please continue to monitor your blood sugars You will be provided with work and school release notes   Increase activity slowly   Complete by:  As directed      Allergies as of 01/26/2018   No Known Allergies     Medication List    STOP taking these medications   ibuprofen 200 MG tablet Commonly known as:  ADVIL,MOTRIN     TAKE these medications   amLODipine 10 MG tablet Commonly known as:  NORVASC Take 1 tablet (10 mg total) by mouth daily.   aspirin 81 MG EC tablet Take 1 tablet (81 mg total) by mouth daily.   atorvastatin 80 MG  tablet Commonly known as:  LIPITOR Take 1 tablet (80 mg total) by mouth daily at 6 PM.   hydrochlorothiazide 25 MG tablet Commonly known as:  HYDRODIURIL Take 1 tablet (25 mg total) by mouth daily.   SYNJARDY 5-500 MG Tabs Generic drug:  Empagliflozin-metFORMIN HCl Take 1 tablet by  mouth daily.      No Known Allergies Follow-up Information    [facility] Follow up in 4 week(s).   Specialty:  Neurology Why:  stroke clinic. office will call with appt date and time. Contact information: [address] [place] [phone]           The results of significant diagnostics from this hospitalization (including imaging, microbiology, ancillary and laboratory) are listed below for reference.    Significant Diagnostic Studies: Ct Head Wo Contrast  Result Date: 01/24/2018 CLINICAL DATA:  Blurred vision. Told he had blood behind retina. High blood pressure. Diabetes. EXAM: CT HEAD WITHOUT CONTRAST TECHNIQUE: Contiguous axial images were obtained from the base of the skull through the vertex without intravenous contrast. COMPARISON:  None. FINDINGS: Brain: No mass lesion, hemorrhage, hydrocephalus, acute infarct, intra-axial, or extra-axial fluid collection. Vascular: No hyperdense vessel or unexpected calcification. Skull: Normal. Sinuses/Orbits: Normal appearance of the orbits and globes. Minimal left maxillary sinus mucosal thickening. Clear mastoid air cells. Other: None. IMPRESSION: 1.  No acute intracranial abnormality. 2. Minimal sinus disease. Electronically Signed   By: Abigail Miyamoto M.D.   On: 01/24/2018 19:19   Mr Brain Wo Contrast  Result Date: 01/24/2018 CLINICAL DATA:  Migraine and headache. Retinal bleed noted at eye doctor today. Hypertensive. History of diabetes. EXAM: MRI HEAD WITHOUT CONTRAST TECHNIQUE: Coronal and axial diffusion weighted imaging. Patient could not complete examination due to reported claustrophobia, patient received medication. COMPARISON:  CT HEAD January 24, 2018 FINDINGS: Brain: Subcentimeter RIGHT periatrial white matter reduced diffusion with low ADC values. No midline shift or mass effect. No parenchymal brain volume loss for age. No hydrocephalus. Vascular: Nondiagnostic  assessment. Skull and upper cervical spine: Nondiagnostic assessment. No reduced diffusion to suggest bone lesion. Sinuses/Orbits: Nondiagnostic assessment. Other: IMPRESSION: 1. Limited 2 sequence MRI head: Acute RIGHT periatrial subcentimeter nonhemorrhagic infarct, in region of optic tract. Electronically Signed   By: Elon Alas M.D.   On: 01/24/2018 23:15    Microbiology: No results found for this or any previous visit (from the past 240 hour(s)).   Labs: Basic Metabolic Panel: Recent Labs  Lab 01/24/18 1807 01/25/18 0400  NA 141 143  K 4.9 4.6  CL 114* 111  CO2 19* 25  GLUCOSE 147* 144*  BUN 29* 22*  CREATININE 1.19 1.11  CALCIUM 8.9 9.3   Liver Function Tests: Recent Labs  Lab 01/24/18 1807 01/25/18 0400  AST 14* 15  ALT 13 13  ALKPHOS 39 37*  BILITOT 0.6 0.7  PROT 6.4* 6.3*  ALBUMIN 3.2* 2.9*   No results for input(s): LIPASE, AMYLASE in the last 168 hours. No results for input(s): AMMONIA in the last 168 hours. CBC: Recent Labs  Lab 01/24/18 1807 01/25/18 0400  WBC 4.4 4.2  NEUTROABS 2.3  --   HGB 10.6* 10.4*  HCT 33.5* 33.4*  MCV 80.9 79.7  PLT 194 181   Cardiac Enzymes: Recent Labs  Lab 01/24/18 1807 01/25/18 0717 01/25/18 1250 01/25/18 1934  TROPONINI <0.03 <0.03 <0.03 <0.03   BNP: BNP (last 3 results) No results for input(s): BNP in the last 8760 hours.  ProBNP (last 3  results) No results for input(s): PROBNP in the last 8760 hours.  CBG: Recent Labs  Lab 01/25/18 0622 01/25/18 1140 01/25/18 1647 01/25/18 2150 01/26/18 0647  GLUCAP 127* 146* 202* 106* 172*       Signed:  Nita Sells MD   [facility] 01/26/2018, 10:54 AM

## 2018-01-26 NOTE — Progress Notes (Signed)
Occupational Therapy Treatment Patient Details Name: Bill [name] MRN: [id number] DOB: May 10, 1981 Today's Date: 01/26/2018    History of present illness 37 y.o. male with DM who does not have a PCP with significant family history of hypertension and diabetes mellitus presents to [name] ER after being sent by his ophthalmologist who diagnosed with retinal hemorrhages. MRI brain showed a small subacute infarct in the right peri-atrial area.   OT comments  Pt progressing towards OT goals this session. Pt provided with pamphlet for services for the blind as well as reviewed cmpensatory strategies for low vision. Plans to follow up with MD and hopeful for a return to work as soon as possible. Current POC remains appropriate.    Follow Up Recommendations  No OT follow up;Supervision - Intermittent    Equipment Recommendations  None recommended by OT    Recommendations for Other Services      Precautions / Restrictions Precautions Precautions: Fall Precaution Comments: watch BP Restrictions Weight Bearing Restrictions: No       Mobility Bed Mobility Overal bed mobility: Independent                Transfers Overall transfer level: Modified independent   Transfers: Sit to/from Stand                Balance Overall balance assessment: No apparent balance deficits (not formally assessed)                                         ADL either performed or assessed with clinical judgement   ADL Overall ADL's : Needs assistance/impaired                 Upper Body Dressing : Supervision/safety;Sitting   Lower Body Dressing: Supervision/safety;Sit to/from stand Lower Body Dressing Details (indicate cue type and reason): able to don underwear, shorts, socks and shoes Toilet Transfer: Min guard;Ambulation Toilet Transfer Details (indicate cue type and reason): min guard for visual deficits         Functional mobility during ADLs: Min  guard General ADL Comments: min guard due to syncopal episode last session - no issues this session     Vision   Additional Comments: Pt states that vision remains impaired, spots and cloudy   Perception     Praxis      Cognition Arousal/Alertness: Awake/alert Behavior During Therapy: WFL for tasks assessed/performed Overall Cognitive Status: Within Functional Limits for tasks assessed                                          Exercises     Shoulder Instructions       General Comments Pt provided with pamphlet for services for the blind as well as reviewed cmpensatory strategies for low vision.     Pertinent Vitals/ Pain       Pain Assessment: No/denies pain  Home Living                                          Prior Functioning/Environment              Frequency  Min 2X/week        Progress Toward  Goals  OT Goals(current goals can now be found in the care plan section)  Progress towards OT goals: Progressing toward goals  Acute Rehab OT Goals Patient Stated Goal: to return to work OT Goal Formulation: With patient Time For Goal Achievement: 02/08/18 Potential to Achieve Goals: Good  Plan Discharge plan remains appropriate;Frequency remains appropriate    Co-evaluation                 AM-PAC PT "6 Clicks" Daily Activity     Outcome Measure   Help from another person eating meals?: None Help from another person taking care of personal grooming?: None Help from another person toileting, which includes using toliet, bedpan, or urinal?: None Help from another person bathing (including washing, rinsing, drying)?: None Help from another person to put on and taking off regular upper body clothing?: None Help from another person to put on and taking off regular lower body clothing?: None 6 Click Score: 24    End of Session Equipment Utilized During Treatment: Gait belt  OT Visit Diagnosis: Low vision, both  eyes (H54.2)   Activity Tolerance Patient tolerated treatment well   Patient Left in bed;with call bell/phone within reach;with family/visitor present   Nurse Communication Mobility status(ready for dc)        Time: 2836-6294 OT Time Calculation (min): 20 min  Charges: OT General Charges $OT Visit: 1 Visit OT Treatments $Self Care/Home Management : 8-22 mins  Hulda Humphrey OTR/L [place] 01/26/2018, 4:10 PM

## 2018-01-29 ENCOUNTER — Ambulatory Visit (INDEPENDENT_AMBULATORY_CARE_PROVIDER_SITE_OTHER): Payer: MEDICAID | Admitting: Ophthalmology

## 2018-01-29 ENCOUNTER — Encounter (INDEPENDENT_AMBULATORY_CARE_PROVIDER_SITE_OTHER): Payer: Self-pay | Admitting: Ophthalmology

## 2018-01-29 VITALS — BP 130/80 | HR 86

## 2018-01-29 DIAGNOSIS — I10 Essential (primary) hypertension: Secondary | ICD-10-CM

## 2018-01-29 DIAGNOSIS — E113313 Type 2 diabetes mellitus with moderate nonproliferative diabetic retinopathy with macular edema, bilateral: Secondary | ICD-10-CM

## 2018-01-29 DIAGNOSIS — I639 Cerebral infarction, unspecified: Secondary | ICD-10-CM

## 2018-01-29 DIAGNOSIS — H3581 Retinal edema: Secondary | ICD-10-CM

## 2018-01-29 DIAGNOSIS — H35033 Hypertensive retinopathy, bilateral: Secondary | ICD-10-CM

## 2018-01-29 NOTE — Progress Notes (Signed)
[facility] Note  01/29/2018     CHIEF COMPLAINT Patient presents for Retina Evaluation   HISTORY OF PRESENT ILLNESS: Bill [name] is a 37 y.o. male who presents to the clinic today for:   HPI    Retina Evaluation    In both eyes.  Duration of 5 days.  Associated Symptoms Negative for Flashes, Blind Spot, Photophobia, Scalp Tenderness, Fever, Weight Loss, Jaw Claudication, Glare, Pain, Floaters, Distortion, Redness, Trauma, Shoulder/Hip pain and Fatigue.  Context:  distance vision, mid-range vision and near vision.  Treatments tried include no treatments.  Response to treatment was no improvement.  I, the attending physician,  performed the HPI with the patient and updated documentation appropriately.          Comments    Pt presents on the referral of Dr. Aron Baba for concern of NPDR/PDR OU, pt states he saw Dr. [name] because he was having a problem with his eyes, pt states his vision is blurry, cloudy and everything is dim, pt denies FOL, pain, wavy vision and floaters, pt denies the use of gtts, pts last A1C was 10, checked on 08/15, pts blood sugar was 164 this morning before he ate       Last edited by [name], MD on 01/29/2018  9:48 AM. (History)    Pt states he was seen by Dr. [name] on Wednesday due to having blurred VA x 2 weeks; Pt states he was also having severe headaches; Pt states he was then seen to ED to worked up for HBP; Pt states he was told he had a small stroke; Pt endorses dx of DM; Pt states the highest BP was 204 in the hospital; Pt states he has never ben told he has HBP until now; Pt states he was told that he has HBP due to a lot of stress, pt states he got a new job, moved here from [place], has 2 small children; Pt reports DM has been uncontrolled as well; Pt states he was seen by PCP and was told BP was elevated and was given ibuprofen; Pt states PCP was under the impression elevated BP was an isolated event due to  many stressors;   Referring physician: .Dianah Field, MD [address], [place] [address]  HISTORICAL INFORMATION:   Selected notes from the MEDICAL RECORD NUMBER Referred by Dr. [name] for concern of severe NPDR / PDR OU; LEE-  Ocular Hx-  PMH-     CURRENT MEDICATIONS: No current outpatient medications on file. (Ophthalmic Drugs)   No current facility-administered medications for this visit.  (Ophthalmic Drugs)   Current Outpatient Medications (Other)  Medication Sig  . amLODipine (NORVASC) 10 MG tablet Take 1 tablet (10 mg total) by mouth daily.  Marland Kitchen aspirin 81 MG EC tablet Take 1 tablet (81 mg total) by mouth daily.  Marland Kitchen atorvastatin (LIPITOR) 80 MG tablet Take 1 tablet (80 mg total) by mouth daily at 6 PM.  . Empagliflozin-metFORMIN HCl (SYNJARDY) 5-500 MG TABS Take 1 tablet by mouth daily.  . hydrochlorothiazide (HYDRODIURIL) 25 MG tablet Take 1 tablet (25 mg total) by mouth daily.   No current facility-administered medications for this visit.  (Other)      REVIEW OF SYSTEMS: ROS    Positive for: Endocrine, Cardiovascular, Eyes   Negative for: Constitutional, Gastrointestinal, Neurological, Skin, Genitourinary, Musculoskeletal, HENT, Respiratory, Psychiatric, Allergic/Imm, Heme/Lymph   Last edited by Debbrah Alar, COT on 01/29/2018  9:16 AM. (  History)       ALLERGIES No Known Allergies  PAST MEDICAL HISTORY Past Medical History:  Diagnosis Date  . Diabetes mellitus without complication ([place])   . Stroke [facility])    Past Surgical History:  Procedure Laterality Date  . arm surgery      FAMILY HISTORY Family History  Problem Relation Age of Onset  . Diabetes Mellitus II Mother   . Hypertension Mother   . Cataracts Mother   . Diabetes Mellitus II Father   . Stroke Father   . Hypertension Father   . Glaucoma Maternal Grandmother   . Amblyopia Neg Hx   . Blindness Neg Hx   . Macular degeneration Neg Hx   . Retinal detachment Neg Hx   .  Strabismus Neg Hx   . Retinitis pigmentosa Neg Hx     SOCIAL HISTORY Social History   Tobacco Use  . Smoking status: Never Smoker  . Smokeless tobacco: Never Used  Substance Use Topics  . Alcohol use: Yes    Comment: Occasionally.  . Drug use: Not on file         OPHTHALMIC EXAM:  Base Eye Exam    Visual Acuity (Snellen - Linear)      Right Left   Dist [place] 20/50 +2 20/40 -2   Dist ph [place] 20/25 -2 20/30       Tonometry (Tonopen, 9:24 AM)      Right Left   Pressure 17 17       Pupils      Dark Light Shape React APD   Right 5 3 Round Brisk None   Left 5 3 Round Brisk None       Visual Fields (Counting fingers)      Left Right    Full Full       Extraocular Movement      Right Left    Full, Ortho Full, Ortho       Neuro/Psych    Oriented x3:  Yes   Mood/Affect:  Normal       Dilation    Both eyes:  1.0% Mydriacyl, 2.5% Phenylephrine @ 9:24 AM        Slit Lamp and Fundus Exam    Slit Lamp Exam      Right Left   Lids/Lashes Normal Normal   Conjunctiva/Sclera White and quiet White and quiet   Cornea Clear Clear   Anterior Chamber Deep and quiet Deep and quiet   Iris Round and dilated Round and dilated   Lens 1+ Nuclear sclerosis, 1+ Cortical cataract 1+ Nuclear sclerosis, 1+ Cortical cataract   Vitreous Normal Normal       Fundus Exam      Right Left   Disc Pink and Sharp Pink and Sharp, Compact   C/D Ratio 0.1 0.2   Macula Blunted foveal reflex, +edema, scattered IRH, scattered CWS blunted foveal reflex, +edema, Scattered IRH, scattered CWS   Vessels Vascular attenuation, Tortuous, Copper wiring, ?NV flat along IT arcade, focal pigmentation on 0300 vessel periperally Vascular attenuation, Tortuous, ? Flat NV along ST arcade   Periphery Attached, scattered IRH and CWS mostly posterior and around the disc Attached, scattered IRH and CWS mostly posterior and around the disc          IMAGING AND PROCEDURES  Imaging and Procedures for  _0 @  OCT, Retina - OU - Both Eyes       Right Eye Quality was good. Central Foveal Thickness: 387. Progression has no  prior data. Findings include abnormal foveal contour, intraretinal fluid, subretinal fluid, intraretinal hyper-reflective material.   Left Eye Quality was good. Central Foveal Thickness: 474. Progression has no prior data. Findings include abnormal foveal contour, intraretinal fluid, subretinal fluid, intraretinal hyper-reflective material.   Notes *Images captured and stored on drive  Diagnosis / Impression:  Severe macular edema OU  Clinical management:  See below  Abbreviations: NFP - Normal foveal profile. CME - cystoid macular edema. PED - pigment epithelial detachment. IRF - intraretinal fluid. SRF - subretinal fluid. EZ - ellipsoid zone. ERM - epiretinal membrane. ORA - outer retinal atrophy. ORT - outer retinal tubulation. SRHM - subretinal hyper-reflective material         Fluorescein Angiography Optos (Transit OS)       Right Eye   Progression has no prior data. Early phase findings include microaneurysm, vascular perfusion defect (Telangectasia ). Mid/Late phase findings include microaneurysm, vascular perfusion defect, leakage (Hyper fluorescence of the disc).   Left Eye   Progression has no prior data. Early phase findings include microaneurysm, vascular perfusion defect (Telangectasia ). Mid/Late phase findings include leakage, microaneurysm, vascular perfusion defect.   Notes *Images captured and stored on drive;   Impression: Hypertensive retinopathy OU No NV Late leaking MAs OU                  ASSESSMENT/PLAN:    ICD-10-CM   1. Hypertensive retinopathy of both eyes H35.033 Fluorescein Angiography Optos (Transit OS)  2. Essential hypertension I10   3. Retinal edema H35.81 OCT, Retina - OU - Both Eyes  4. Moderate nonproliferative diabetic retinopathy of both eyes with macular edema associated with type 2 diabetes  mellitus (HCC) U04.5409 Fluorescein Angiography Optos (Transit OS)  5. Acute CVA (cerebrovascular accident) ([place]) I63.9     1-3. 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 [name] last week with a 2 wk history intractable headaches and decreased vision OU - found to have BP of 170s /90s -- sent to ED and was [facility] to [facility] where SBP was up to 200 - discussed importance of tight BP control - BP in office today, 130/80 - monitor for now  4. Moderate Non-proliferative diabetic retinopathy, OU - The incidence, risk factors for progression, natural history and treatment options for diabetic retinopathy  were discussed with patient.   - The need for close monitoring of blood glucose, blood pressure, and serum lipids, avoiding cigarette or any type of tobacco, and the need for long term follow up was also discussed with patient. - exam shows scattered IRH, CWS - FA shows some MA, patches of capillary nonperfusion - OCT shows macular edema OU, but suspect mostly due to HTN - recommend observation for now due to recent stroke -- pt in agreement  5. Acute CVA secondary to uncontrolled HTN - MRI on 8.14.19: IMPRESSION: 1. Limited 2 sequence MRI head: Acute RIGHT periatrial subcentimeter nonhemorrhagic infarct, in region of optic tract. - could be limiting vision -- will check formal visual field once macular edema improves    Ophthalmic Meds Ordered this visit:  No orders of the defined types were placed in this encounter.      Return in about 3 weeks (around 02/19/2018) for F/U HTN ret OU, DFE OCT.  There are no Patient Instructions on file for this visit.   Explained the diagnoses, plan, and follow up with the patient and they expressed understanding.  Patient expressed understanding of the importance of proper  follow up care.    Gardiner Sleeper, M.D., Ph.D. Diseases & Surgery of the Retina and Vitreous [facility]  I have reviewed the above documentation for accuracy and completeness, and I agree with the above. Gardiner Sleeper, M.D., Ph.D. 01/29/18 12:55 PM    Abbreviations: M myopia (nearsighted); A astigmatism; H hyperopia (farsighted); P presbyopia; Mrx spectacle prescription;  CTL contact lenses; OD right eye; OS left eye; OU both eyes  XT exotropia; ET esotropia; PEK punctate epithelial keratitis; PEE punctate epithelial erosions; DES dry eye syndrome; MGD meibomian gland dysfunction; ATs artificial tears; PFAT's preservative free artificial tears; [place] nuclear sclerotic cataract; PSC posterior subcapsular cataract; ERM epi-retinal membrane; PVD posterior vitreous detachment; RD retinal detachment; DM diabetes mellitus; DR diabetic retinopathy; NPDR non-proliferative diabetic retinopathy; PDR proliferative diabetic retinopathy; CSME clinically significant macular edema; DME diabetic macular edema; dbh dot blot hemorrhages; CWS cotton wool spot; POAG primary open angle glaucoma; C/D cup-to-disc ratio; HVF humphrey visual field; GVF goldmann visual field; OCT optical coherence tomography; IOP intraocular pressure; BRVO Branch retinal vein occlusion; CRVO central retinal vein occlusion; CRAO central retinal artery occlusion; BRAO branch retinal artery occlusion; RT retinal tear; SB scleral buckle; PPV pars plana vitrectomy; VH Vitreous hemorrhage; PRP panretinal laser photocoagulation; IVK intravitreal kenalog; VMT vitreomacular traction; MH Macular hole;  NVD neovascularization of the disc; NVE neovascularization elsewhere; AREDS age related eye disease study; ARMD age related macular degeneration; POAG primary open angle glaucoma; EBMD epithelial/anterior basement membrane dystrophy; ACIOL anterior chamber intraocular lens; IOL intraocular lens; PCIOL posterior chamber intraocular lens; Phaco/IOL phacoemulsification with intraocular lens placement; [place] photorefractive keratectomy; LASIK laser assisted in  situ keratomileusis; HTN hypertension; DM diabetes mellitus; COPD chronic obstructive pulmonary disease

## 2018-02-09 ENCOUNTER — Inpatient Hospital Stay: Payer: Self-pay | Admitting: Family Medicine

## 2018-02-19 ENCOUNTER — Encounter (INDEPENDENT_AMBULATORY_CARE_PROVIDER_SITE_OTHER): Payer: MEDICAID | Admitting: Ophthalmology

## 2018-02-21 ENCOUNTER — Inpatient Hospital Stay: Payer: Self-pay

## 2018-02-21 NOTE — Progress Notes (Deleted)
Patient ID: Bill [name], male   DOB: 06/30/1980, 37 y.o.   MRN: [id number]  After hospitalization for acute CVA 8/14-8/16/2019.  From discharge summary: History of present illness:  37 year old male with long-standing diabetes over 10 years uncontrolled hypertension presented with hypertensive urgency from ophthalmology office and found to have retinal hemorrhages-not felt by neurology on work-up in the emergency room to have had more hypertensive associated headaches and blurring of vision-neurology recommended limited stroke work-up which was done including negative carotid Doppler transthoracic echo showing EF of 55%-60% with grade 1 diastolic dysfunction (compensated without acute component) Hyperlipidemia LDL of 189 and total cholesterol 244 for which she was prescribed a statin and was placed after discussion with neurology and ophthalmology on aspirin-I have reiterated clearly to him he will need to have close follow-up-Case manager will set him up with an outpatient appointment-he will need to follow-up with his ophthalmologist at the earliest but was stabilized for discharge home Patient was given letter for work in addition to letter for school and stabilized

## 2018-02-22 ENCOUNTER — Encounter (INDEPENDENT_AMBULATORY_CARE_PROVIDER_SITE_OTHER): Payer: MEDICAID | Admitting: Ophthalmology

## 2018-02-28 ENCOUNTER — Ambulatory Visit: Payer: MEDICAID | Admitting: Adult Health

## 2018-02-28 NOTE — Progress Notes (Deleted)
[facility] [address] [place]. [place] [address] [phone]       OFFICE FOLLOW UP NOTE  [name] Date of Birth:  December 16, 1980 Medical Record Number:  [id number]   Reason for Referral:  hospital stroke follow up  CHIEF COMPLAINT:  No chief complaint on file.   HPI: Bill [name] is being seen today for initial visit in the office for right parietal infarct on 01/24/2018. History obtained from *** and chart review. Reviewed all radiology images and labs personally.  Bill [name] is a 37 y.o. male with history of HTN, DB who presented with new retinal bleed, HA, blurry vision and hypertensive emergency.  He was initially evaluated by his ophthalmologist as outpatient who diagnosed him with retinal hemorrhage bilaterally and was sent to the ER for further evaluation.  CT head was negative for acute infarct.  MRI head reviewed and showed acute tiny right parietal infarct.  2D echo showed an EF of ***.  Carotid Doppler showed bilateral ICA stenosis of 1 to 39% with VA antegrade.  It was felt as though finding of infarct was incidental as presentation did not correlate to area of infarct.  Incidental stroke finding most likely related to small vessel disease given uncontrolled stroke risk factors.  LDL 189 and his patient was not on statin PTA recommended to start Lipitor 80 mg daily.  Patient was found to be in hypertensive emergency with SBP greater than 200.  The stabilize during hospital stay and recommended BP goal normotensive range.  A1c elevated at 10.4 and recommended tight glycemic control with close PCP follow-up for DM management.  Patient was not on antithrombotic PTA and recommended aspirin 81 mg daily once cleared by retinal specialist/ophthalmology.  Patient was discharged home with family in stable condition without therapy needs.  Patient did have appointment with retina specialist on 01/29/2018.  Per notes, he was found to have severe  hypertensive retinopathy with macular edema in both eyes and it was recommended for tight blood pressure control and to continue to monitor.  Also found to have moderate nonproliferative diabetic retinopathy in both eyes and recommended close monitoring of glucose, blood pressure and serum lipids with avoiding cigarette any type of tobacco and need for long-term follow-up and recommended to continue to monitor at this time.  Recommended for patient to return in 3 weeks once macular edema improves for formal visual field test to assess if recent stroke could be limiting his vision.   ROS:   14 system review of systems performed and negative with exception of ***  PMH:  Past Medical History:  Diagnosis Date  . Diabetes mellitus without complication ([place])   . Stroke [facility])     PSH:  Past Surgical History:  Procedure Laterality Date  . arm surgery      Social History:  Social History   Socioeconomic History  . Marital status: Married    Spouse name: Not on file  . Number of children: Not on file  . Years of education: Not on file  . Highest education level: Not on file  Occupational History  . Not on file  Social Needs  . Financial resource strain: Not on file  . Food insecurity:    Worry: Not on file    Inability: Not on file  . Transportation needs:    Medical: Not on file    Non-medical: Not on file  Tobacco Use  . Smoking status: Never Smoker  . Smokeless tobacco: Never Used  Substance and Sexual Activity  . Alcohol use: Yes    Comment: Occasionally.  . Drug use: Not on file  . Sexual activity: Not on file  Lifestyle  . Physical activity:    Days per week: Not on file    Minutes per session: Not on file  . Stress: Not on file  Relationships  . Social connections:    Talks on phone: Not on file    Gets together: Not on file    Attends religious service: Not on file    Active member of club or organization: Not on file    Attends meetings of clubs or  organizations: Not on file    Relationship status: Not on file  . Intimate partner violence:    Fear of current or ex partner: Not on file    Emotionally abused: Not on file    Physically abused: Not on file    Forced sexual activity: Not on file  Other Topics Concern  . Not on file  Social History Narrative  . Not on file    Family History:  Family History  Problem Relation Age of Onset  . Diabetes Mellitus II Mother   . Hypertension Mother   . Cataracts Mother   . Diabetes Mellitus II Father   . Stroke Father   . Hypertension Father   . Glaucoma Maternal Grandmother   . Amblyopia Neg Hx   . Blindness Neg Hx   . Macular degeneration Neg Hx   . Retinal detachment Neg Hx   . Strabismus Neg Hx   . Retinitis pigmentosa Neg Hx     Medications:   Current Outpatient Medications on File Prior to Visit  Medication Sig Dispense Refill  . amLODipine (NORVASC) 10 MG tablet Take 1 tablet (10 mg total) by mouth daily. 30 tablet 11  . aspirin 81 MG EC tablet Take 1 tablet (81 mg total) by mouth daily. 30 tablet 11  . atorvastatin (LIPITOR) 80 MG tablet Take 1 tablet (80 mg total) by mouth daily at 6 PM. 30 tablet 12  . Empagliflozin-metFORMIN HCl (SYNJARDY) 5-500 MG TABS Take 1 tablet by mouth daily.    . hydrochlorothiazide (HYDRODIURIL) 25 MG tablet Take 1 tablet (25 mg total) by mouth daily. 30 tablet 0   No current facility-administered medications on file prior to visit.     Allergies:  No Known Allergies   Physical Exam  There were no vitals filed for this visit. There is no height or weight on file to calculate BMI. No exam data present  General: well developed, well nourished, seated, in no evident distress Head: head normocephalic and atraumatic.   Neck: supple with no carotid or supraclavicular bruits Cardiovascular: regular rate and rhythm, no murmurs Musculoskeletal: no deformity Skin:  no rash/petichiae Vascular:  Normal pulses all extremities  Neurologic  Exam Mental Status: Awake and fully alert. Oriented to place and time. Recent and remote memory intact. Attention span, concentration and fund of knowledge appropriate. Mood and affect appropriate.  Cranial Nerves: Fundoscopic exam reveals sharp disc margins. Pupils equal, briskly reactive to light. Extraocular movements full without nystagmus. Visual fields full to confrontation. Hearing intact. Facial sensation intact. Face, tongue, palate moves normally and symmetrically.  Motor: Normal bulk and tone. Normal strength in all tested extremity muscles. Sensory.: intact to touch , pinprick , position and vibratory sensation.  Coordination: Rapid alternating movements normal in all extremities. Finger-to-nose and heel-to-shin performed accurately bilaterally. Gait and Station: Arises from chair without difficulty.  Stance is normal. Gait demonstrates normal stride length and balance . Able to heel, toe and tandem walk without difficulty.  Reflexes: 1+ and symmetric. Toes downgoing.    NIHSS  *** Modified Rankin  ***   Diagnostic Data (Labs, Imaging, Testing)  CT HEAD WO CONTRAST 01/24/2018 IMPRESSION: 1.  No acute intracranial abnormality. 2. Minimal sinus disease.  MR BRAIN WO CONTRAST 01/24/2018 IMPRESSION: 1. Limited 2 sequence MRI head: Acute RIGHT periatrial subcentimeter nonhemorrhagic infarct, in region of optic tract.  VAS US CAROTID DUPLEX BILATERAL 01/25/2018 Final Interpretation: Right Carotid: The extracranial vessels were near-normal with only minimal wall        thickening or plaque. Left Carotid: The extracranial vessels were near-normal with only minimal wall       thickening or plaque. Vertebrals: Bilateral vertebral arteries demonstrate antegrade flow. Subclavians: Normal flow hemodynamics were seen in bilateral subclavian       arteries.  ECHOCARDIOGRAM 01/25/2018 Study Conclusions - Left ventricle: The cavity size was normal. There was  moderate   concentric hypertrophy. Systolic function was vigorous. The   estimated ejection fraction was in the range of 65% to 70%. Wall   motion was normal; there were no regional wall motion   abnormalities. Doppler parameters are consistent with abnormal   left ventricular relaxation (grade 1 diastolic dysfunction).   Doppler parameters are consistent with elevated ventricular   end-diastolic filling pressure. - Mitral valve: There was mild regurgitation. - Left atrium: The atrium was mildly dilated. - Right ventricle: Systolic function was normal. - Tricuspid valve: There was mild regurgitation. - Pulmonary arteries: Systolic pressure was within the normal   range. - Inferior vena cava: The vessel was normal in size. - Pericardium, extracardiac: There was no pericardial effusion.   ASSESSMENT: Bill [name] is a 37 y.o. 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 Vascular risk factors include uncontrolled HTN, HLD and DM.     PLAN: -Continue {anticoagulants:31417}  and ***  for secondary stroke prevention -F/u with PCP regarding your *** management -continue to monitor BP at home -advised to continue to stay active and maintain a healthy diet -Maintain strict control of hypertension with blood pressure goal below 130/90, diabetes with hemoglobin A1c goal below 6.5% and cholesterol with LDL cholesterol (bad cholesterol) goal below 70 mg/dL. I also advised the patient to eat a healthy diet with plenty of whole grains, cereals, fruits and vegetables, exercise regularly and maintain ideal body weight.  Follow up in *** or call earlier if needed   Greater than 50% of time during this 25 minute visit was spent on counseling,explanation of diagnosis of ***, reviewing risk factor management of ***, planning of further management, discussion with patient and family and coordination of care    Venancio Poisson, [facility]  [facility] [address] [place], [place] [address]  Phone [phone] Fax [phone] Note: This document was prepared with digital dictation and possible smart phrase technology. Any transcriptional errors that result from this process are unintentional.

## 2018-03-01 ENCOUNTER — Telehealth: Payer: Self-pay

## 2018-03-01 ENCOUNTER — Encounter: Payer: Self-pay | Admitting: Adult Health

## 2018-03-01 NOTE — Telephone Encounter (Signed)
Pt no show for appt on 02/28/2018.

## 2018-03-13 NOTE — Progress Notes (Signed)
[facility] Note  03/14/2018     CHIEF COMPLAINT Patient presents for Retina Follow Up   HISTORY OF PRESENT ILLNESS: Bill [name] is a 37 y.o. male who presents to the clinic today for:   HPI    Retina Follow Up    Patient presents with  Other.  In both eyes.  Severity is moderate.  Duration of 5 weeks.  Since onset it is stable.  I, the attending physician,  performed the HPI with the patient and updated documentation appropriately.          Comments    Pt presents for HTN ret OU f/u, pt states vision is a little better, pt denies flashes, floaters, pain or wavy vision, pt denies the use of gtts, pt has not checked blood sugar this morning       Last edited by [name], MD on 03/14/2018 10:27 AM. (History)    Pt states CBG has been in the "120s" since being seen last";   Referring physician: No referring provider defined for this encounter.  HISTORICAL INFORMATION:   Selected notes from the MEDICAL RECORD NUMBER Referred by Dr. [name] for concern of severe NPDR / PDR OU; LEE-  Ocular Hx-  PMH-     CURRENT MEDICATIONS: No current outpatient medications on file. (Ophthalmic Drugs)   No current facility-administered medications for this visit.  (Ophthalmic Drugs)   Current Outpatient Medications (Other)  Medication Sig  . telmisartan (MICARDIS) 80 MG tablet Take 80 mg by mouth daily.  Marland Kitchen amLODipine (NORVASC) 10 MG tablet Take 1 tablet (10 mg total) by mouth daily.  Marland Kitchen aspirin 81 MG EC tablet Take 1 tablet (81 mg total) by mouth daily.  Marland Kitchen atorvastatin (LIPITOR) 80 MG tablet Take 1 tablet (80 mg total) by mouth daily at 6 PM.  . Empagliflozin-metFORMIN HCl (SYNJARDY) 5-500 MG TABS Take 1 tablet by mouth daily.  . hydrochlorothiazide (HYDRODIURIL) 25 MG tablet Take 1 tablet (25 mg total) by mouth daily.  Marland Kitchen ibuprofen (ADVIL,MOTRIN) 800 MG tablet TAKE 1 TABLET BY MOUTH THREE TIMES A DAY FOR 10 DAYS   No current facility-administered  medications for this visit.  (Other)      REVIEW OF SYSTEMS: ROS    Positive for: Endocrine, Cardiovascular, Eyes, Allergic/Imm   Negative for: Constitutional, Gastrointestinal, Neurological, Skin, Genitourinary, Musculoskeletal, HENT, Respiratory, Psychiatric, Heme/Lymph   Last edited by Debbrah Alar, COT on 03/14/2018  9:45 AM. (History)       ALLERGIES No Known Allergies  PAST MEDICAL HISTORY Past Medical History:  Diagnosis Date  . Diabetes mellitus without complication ([place])   . Stroke [facility])    Past Surgical History:  Procedure Laterality Date  . arm surgery      FAMILY HISTORY Family History  Problem Relation Age of Onset  . Diabetes Mellitus II Mother   . Hypertension Mother   . Cataracts Mother   . Diabetes Mellitus II Father   . Stroke Father   . Hypertension Father   . Glaucoma Maternal Grandmother   . Amblyopia Neg Hx   . Blindness Neg Hx   . Macular degeneration Neg Hx   . Retinal detachment Neg Hx   . Strabismus Neg Hx   . Retinitis pigmentosa Neg Hx     SOCIAL HISTORY Social History   Tobacco Use  . Smoking status: Never Smoker  . Smokeless tobacco: Never Used  Substance Use Topics  . Alcohol use: Yes    Comment:  Occasionally.  . Drug use: Not on file         OPHTHALMIC EXAM:  Base Eye Exam    Visual Acuity (Snellen - Linear)      Right Left   Dist [place] 20/50 20/40 -2   Dist ph [place] 20/40 +1 NI       Tonometry (Tonopen, 9:51 AM)      Right Left   Pressure 11 15       Pupils      Dark Light Shape React APD   Right 5 3 Round Brisk None   Left 5 3 Round Brisk None       Visual Fields (Counting fingers)      Left Right    Full Full       Extraocular Movement      Right Left    Full, Ortho Full, Ortho       Neuro/Psych    Oriented x3:  Yes   Mood/Affect:  Normal       Dilation    Both eyes:  1.0% Mydriacyl, 2.5% Phenylephrine @ 9:51 AM        Slit Lamp and Fundus Exam    Slit Lamp Exam      Right Left    Lids/Lashes Normal Normal   Conjunctiva/Sclera White and quiet White and quiet   Cornea Clear Clear   Anterior Chamber Deep and quiet Deep and quiet   Iris Round and dilated Round and dilated   Lens 1+ Nuclear sclerosis, 1+ Cortical cataract 1+ Nuclear sclerosis, 1+ Cortical cataract   Vitreous Normal Normal       Fundus Exam      Right Left   Disc Pink and Sharp, early fine NVD, early fibrosis Pink and Sharp, Compact   C/D Ratio 0.1 0.2   Macula Blunted foveal reflex, +edema, scattered IRH, scattered CWS, scattered Exudates, scattered NVE blunted foveal reflex, +edema, Scattered IRH, scattered CWS greatest superior macula, inferior Exudates   Vessels Vascular attenuation, Tortuous, Copper wiring, scattered NV along arcades, focal pigmentation on 0300 vessel periperally Vascular attenuation, Tortuous, ? Flat NV along ST arcade   Periphery Attached, scattered IRH and CWS mostly posterior and around the disc Attached, scattered IRH and CWS mostly posterior and around the disc          IMAGING AND PROCEDURES  Imaging and Procedures for _0 @  OCT, Retina - OU - Both Eyes       Right Eye Quality was good. Central Foveal Thickness: 393. Findings include abnormal foveal contour, intraretinal fluid, subretinal fluid, intraretinal hyper-reflective material (interval improvement in SRF and nasal IRF, interval worsening of temporal IRF).   Left Eye Quality was good. Central Foveal Thickness: 391. Progression has been stable. Findings include abnormal foveal contour, intraretinal fluid, subretinal fluid, intraretinal hyper-reflective material (Persistent SRF, stable to slightly increased IRF temporal to fovea).   Notes *Images captured and stored on drive  Diagnosis / Impression:  Severe DME OU OD: interval improvement in SRF and nasal IRF, interval worsening of temporal IRF OS: Persistent SRF, stable to slightly increased IRF temporal to fovea  Clinical management:  See  below  Abbreviations: NFP - Normal foveal profile. CME - cystoid macular edema. PED - pigment epithelial detachment. IRF - intraretinal fluid. SRF - subretinal fluid. EZ - ellipsoid zone. ERM - epiretinal membrane. ORA - outer retinal atrophy. ORT - outer retinal tubulation. SRHM - subretinal hyper-reflective material         Panretinal Photocoagulation - OD -  Right Eye       LASER PROCEDURE NOTE  Diagnosis:   Proliferative Diabetic Retinopathy, RIGHT EYE  Procedure:  Pan-retinal photocoagulation using slit lamp laser, RIGHT EYE  Anesthesia:  Topical  Surgeon: [name], MD, PhD   Informed consent obtained, operative eye marked, and time out performed prior to initiation of laser.   [place] [id number] slit lamp laser Pattern: 3x3 square Power: 240 mW Duration: 30 msec  Spot size: 200 microns  # spots: [phone] spots   Complications: None  RTC: 3 wks  Patient tolerated the procedure well and received written and verbal post-procedure care information/education.                  ASSESSMENT/PLAN:    ICD-10-CM   1. Proliferative diabetic retinopathy of right eye with macular edema associated with type 2 diabetes mellitus ([place]) W11.9147 Panretinal Photocoagulation - OD - Right Eye  2. Severe nonproliferative diabetic retinopathy of left eye with macular edema associated with type 2 diabetes mellitus ([place]) W29.5621   3. Hypertensive retinopathy of both eyes H35.033   4. Essential hypertension I10   5. Retinal edema H35.81 OCT, Retina - OU - Both Eyes  6. Acute CVA (cerebrovascular accident) ([place]) I63.9     1,2. Proliferative diabetic retinopathy OD; Severe Non-proliferative diabetic retinopathy, OS - The incidence, risk factors for progression, natural history and treatment options for diabetic retinopathy  were discussed with patient.   - The need for close monitoring of blood glucose, blood pressure, and serum lipids, avoiding cigarette or any type of tobacco,  and the need for long term follow up was also discussed with patient. - exam shows new early NV OD and persistent DME, IRH, exudates and CWS OU - FA 8.19.19 shows leaking MA, patches of capillary nonperfusion OU - OCT shows macular edema OU, but suspect a lot of it also due to HTN - recommend avoidance of anti-VEGF injections due to recent stroke - recommend PRP OD today (10.02.19) - pt wishes to proceed - RBA of procedure discussed, questions answered - informed consent obtained and signed - see procedure note - start [place]s QID OD x7 days - F/U 3 wks -- possible PRP OS  3-5. 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 [name] in mid August 2019 with a 2 wk history intractable headaches and decreased vision OU - found to have BP of 170s /90s -- sent to ED and was admitted to [facility] where SBP was up to 200 - discussed importance of tight BP control - BP in office today, 157/89 - monitor for now  6. Acute CVA secondary to uncontrolled HTN - MRI on 8.14.19: IMPRESSION: 1. Limited 2 sequence MRI head: Acute RIGHT periatrial subcentimeter nonhemorrhagic infarct, in region of optic tract. - could be limiting vision -- will check formal visual field once macular edema improves    Ophthalmic Meds Ordered this visit:  No orders of the defined types were placed in this encounter.      Return for F/U PDR OD; NPDR OS, DFE, OCT.  There are no Patient Instructions on file for this visit.   Explained the diagnoses, plan, and follow up with the patient and they expressed understanding.  Patient expressed understanding of the importance of proper follow up care.   This document serves as a record of services personally performed by Gardiner Sleeper, MD, PhD. It was created on their behalf by Ernest Mallick, OA, an ophthalmic assistant. The creation of  this record is the provider's dictation and/or activities during the visit.    Electronically  signed by: Ernest Mallick, OA  10.01.19 12:10 PM     Gardiner Sleeper, M.D., Ph.D. Diseases & Surgery of the Retina and Vitreous [facility]  I have reviewed the above documentation for accuracy and completeness, and I agree with the above. Gardiner Sleeper, M.D., Ph.D. 03/14/18 12:10 PM    Abbreviations: M myopia (nearsighted); A astigmatism; H hyperopia (farsighted); P presbyopia; Mrx spectacle prescription;  CTL contact lenses; OD right eye; OS left eye; OU both eyes  XT exotropia; ET esotropia; PEK punctate epithelial keratitis; PEE punctate epithelial erosions; DES dry eye syndrome; MGD meibomian gland dysfunction; ATs artificial tears; PFAT's preservative free artificial tears; [place] nuclear sclerotic cataract; PSC posterior subcapsular cataract; ERM epi-retinal membrane; PVD posterior vitreous detachment; RD retinal detachment; DM diabetes mellitus; DR diabetic retinopathy; NPDR non-proliferative diabetic retinopathy; PDR proliferative diabetic retinopathy; CSME clinically significant macular edema; DME diabetic macular edema; dbh dot blot hemorrhages; CWS cotton wool spot; POAG primary open angle glaucoma; C/D cup-to-disc ratio; HVF humphrey visual field; GVF goldmann visual field; OCT optical coherence tomography; IOP intraocular pressure; BRVO Branch retinal vein occlusion; CRVO central retinal vein occlusion; CRAO central retinal artery occlusion; BRAO branch retinal artery occlusion; RT retinal tear; SB scleral buckle; PPV pars plana vitrectomy; VH Vitreous hemorrhage; PRP panretinal laser photocoagulation; IVK intravitreal kenalog; VMT vitreomacular traction; MH Macular hole;  NVD neovascularization of the disc; NVE neovascularization elsewhere; AREDS age related eye disease study; ARMD age related macular degeneration; POAG primary open angle glaucoma; EBMD epithelial/anterior basement membrane dystrophy; ACIOL anterior chamber intraocular lens; IOL intraocular lens; PCIOL  posterior chamber intraocular lens; Phaco/IOL phacoemulsification with intraocular lens placement; [place] photorefractive keratectomy; LASIK laser assisted in situ keratomileusis; HTN hypertension; DM diabetes mellitus; COPD chronic obstructive pulmonary disease

## 2018-03-14 ENCOUNTER — Encounter (INDEPENDENT_AMBULATORY_CARE_PROVIDER_SITE_OTHER): Payer: Self-pay | Admitting: Ophthalmology

## 2018-03-14 ENCOUNTER — Ambulatory Visit (INDEPENDENT_AMBULATORY_CARE_PROVIDER_SITE_OTHER): Payer: MEDICAID | Admitting: Ophthalmology

## 2018-03-14 VITALS — BP 157/89 | HR 82

## 2018-03-14 DIAGNOSIS — I639 Cerebral infarction, unspecified: Secondary | ICD-10-CM

## 2018-03-14 DIAGNOSIS — E113511 Type 2 diabetes mellitus with proliferative diabetic retinopathy with macular edema, right eye: Secondary | ICD-10-CM

## 2018-03-14 DIAGNOSIS — E113412 Type 2 diabetes mellitus with severe nonproliferative diabetic retinopathy with macular edema, left eye: Secondary | ICD-10-CM

## 2018-03-14 DIAGNOSIS — H35033 Hypertensive retinopathy, bilateral: Secondary | ICD-10-CM

## 2018-03-14 DIAGNOSIS — H3581 Retinal edema: Secondary | ICD-10-CM

## 2018-03-14 DIAGNOSIS — I10 Essential (primary) hypertension: Secondary | ICD-10-CM

## 2018-04-06 ENCOUNTER — Encounter (INDEPENDENT_AMBULATORY_CARE_PROVIDER_SITE_OTHER): Payer: MEDICAID | Admitting: Ophthalmology

## 2018-04-25 ENCOUNTER — Encounter (INDEPENDENT_AMBULATORY_CARE_PROVIDER_SITE_OTHER): Payer: MEDICAID | Admitting: Ophthalmology

## 2018-05-02 ENCOUNTER — Encounter (INDEPENDENT_AMBULATORY_CARE_PROVIDER_SITE_OTHER): Payer: Self-pay | Admitting: Ophthalmology

## 2018-05-02 ENCOUNTER — Ambulatory Visit (INDEPENDENT_AMBULATORY_CARE_PROVIDER_SITE_OTHER): Payer: 59 | Admitting: Ophthalmology

## 2018-05-02 VITALS — BP 194/98 | HR 90

## 2018-05-02 DIAGNOSIS — Z9119 Patient's noncompliance with other medical treatment and regimen: Secondary | ICD-10-CM

## 2018-05-02 DIAGNOSIS — E113513 Type 2 diabetes mellitus with proliferative diabetic retinopathy with macular edema, bilateral: Secondary | ICD-10-CM | POA: Diagnosis not present

## 2018-05-02 DIAGNOSIS — H35033 Hypertensive retinopathy, bilateral: Secondary | ICD-10-CM

## 2018-05-02 DIAGNOSIS — H3581 Retinal edema: Secondary | ICD-10-CM | POA: Diagnosis not present

## 2018-05-02 DIAGNOSIS — Z91199 Patient's noncompliance with other medical treatment and regimen due to unspecified reason: Secondary | ICD-10-CM

## 2018-05-02 DIAGNOSIS — I10 Essential (primary) hypertension: Secondary | ICD-10-CM

## 2018-05-02 DIAGNOSIS — I639 Cerebral infarction, unspecified: Secondary | ICD-10-CM

## 2018-05-02 MED ORDER — PREDNISOLONE ACETATE 1 % OP SUSP: 1.0000 [drp] | Freq: Four times a day (QID) | OPHTHALMIC | 0 refills | Status: AC

## 2018-05-02 NOTE — Progress Notes (Addendum)
[facility] Note  05/02/2018     CHIEF COMPLAINT Patient presents for Retina Follow Up   HISTORY OF PRESENT ILLNESS: Bill [name] is a 37 y.o. male who presents to the clinic today for:   HPI    Retina Follow Up    Patient presents with  Diabetic Retinopathy.  In both eyes.  Severity is moderate.  Duration of 5 days.  Since onset it is gradually worsening.  I, the attending physician,  performed the HPI with the patient and updated documentation appropriately.          Comments    Pt presents for f/u visit for PDR OD, pt states vision has gotten blurry since Saturday, he states it's worse in his left eye, pt has not been checking blood sugar       Last edited by [name], MD on 05/02/2018  2:22 PM. (History)    Pt states his vision has gotten blurrier over the weekend, he states it feels like it's mainly his left eye, pt states he's been having headaches also, pts blood pressure in office today was 200/100, pt states he has been taking his medications prescribed to him by his PCP for blood pressure, but he did not take it today  Referring physician: Arlyss Queen, MD [place], [place] [id number]  HISTORICAL INFORMATION:   Selected notes from the MEDICAL RECORD NUMBER Referred by Dr. [name] for concern of severe NPDR / PDR OU; LEE-  Ocular Hx-  PMH-     CURRENT MEDICATIONS: Current Outpatient Medications (Ophthalmic Drugs)  Medication Sig  . prednisoLONE acetate (PRED FORTE) 1 % ophthalmic suspension Place 1 drop into the left eye 4 (four) times daily for 7 days.   No current facility-administered medications for this visit.  (Ophthalmic Drugs)   Current Outpatient Medications (Other)  Medication Sig  . amLODipine (NORVASC) 10 MG tablet Take 1 tablet (10 mg total) by mouth daily.  Marland Kitchen aspirin 81 MG EC tablet Take 1 tablet (81 mg total) by mouth daily.  Marland Kitchen atorvastatin (LIPITOR) 80 MG tablet Take 1 tablet  (80 mg total) by mouth daily at 6 PM.  . Empagliflozin-metFORMIN HCl (SYNJARDY) 5-500 MG TABS Take 1 tablet by mouth daily.  . hydrochlorothiazide (HYDRODIURIL) 25 MG tablet Take 1 tablet (25 mg total) by mouth daily.  Marland Kitchen ibuprofen (ADVIL,MOTRIN) 800 MG tablet TAKE 1 TABLET BY MOUTH THREE TIMES A DAY FOR 10 DAYS  . telmisartan (MICARDIS) 80 MG tablet Take 80 mg by mouth daily.   No current facility-administered medications for this visit.  (Other)      REVIEW OF SYSTEMS: ROS    Positive for: Endocrine, Cardiovascular, Eyes   Negative for: Constitutional, Gastrointestinal, Neurological, Skin, Genitourinary, Musculoskeletal, HENT, Respiratory, Psychiatric, Allergic/Imm, Heme/Lymph   Last edited by Debbrah Alar, COT on 05/02/2018  1:27 PM. (History)       ALLERGIES No Known Allergies  PAST MEDICAL HISTORY Past Medical History:  Diagnosis Date  . Diabetes mellitus without complication ([place])   . Stroke [facility])    Past Surgical History:  Procedure Laterality Date  . arm surgery      FAMILY HISTORY Family History  Problem Relation Age of Onset  . Diabetes Mellitus II Mother   . Hypertension Mother   . Cataracts Mother   . Diabetes Mellitus II Father   . Stroke Father   . Hypertension Father   . Glaucoma Maternal Grandmother   .  Amblyopia Neg Hx   . Blindness Neg Hx   . Macular degeneration Neg Hx   . Retinal detachment Neg Hx   . Strabismus Neg Hx   . Retinitis pigmentosa Neg Hx     SOCIAL HISTORY Social History   Tobacco Use  . Smoking status: Never Smoker  . Smokeless tobacco: Never Used  Substance Use Topics  . Alcohol use: Yes    Comment: Occasionally.  . Drug use: Not on file         OPHTHALMIC EXAM:  Base Eye Exam    Visual Acuity (Snellen - Linear)      Right Left   Dist [place] 20/100 -2 20/150 -1   Dist ph [place] 20/40 -2 NI       Tonometry (Tonopen, 1:35 PM)      Right Left   Pressure 19 19       Pupils      Dark Light Shape React APD    Right 5 3 Round Brisk None   Left 5 3 Round Brisk None       Visual Fields (Counting fingers)      Left Right    Full Full       Extraocular Movement      Right Left    Full, Ortho Full, Ortho       Neuro/Psych    Oriented x3:  Yes   Mood/Affect:  Normal       Dilation    Both eyes:  1.0% Mydriacyl, 2.5% Phenylephrine @ 1:35 PM        Slit Lamp and Fundus Exam    Slit Lamp Exam      Right Left   Lids/Lashes Normal Normal   Conjunctiva/Sclera White and quiet White and quiet   Cornea Clear Clear   Anterior Chamber Deep and quiet Deep and quiet   Iris Round and dilated, No NVI Round and dilated   Lens 1+ Nuclear sclerosis, 1+ Cortical cataract 1+ Nuclear sclerosis, 1+ Cortical cataract   Vitreous Normal Normal       Fundus Exam      Right Left   Disc Pink and Sharp, +NVD, early fibrosis Pink and Sharp, +fine NVD   C/D Ratio 0.1 0.2   Macula Blunted foveal reflex, +edema, scattered IRH, scattered CWS, scattered Exudates, scattered NVE blunted foveal reflex, +edema, Scattered IRH, scattered CWS greatest superior macula, inferior Exudates, +NVE, early fibrosis temporal to fovea   Vessels Vascular attenuation, Tortuous, Copper wiring, +NVE Copper wiring, severe Vascular attenuation, Tortuous, AV crossing changes   Periphery Attached, scattered DBH, 360 PRP in place Attached, scattered DBH, +NVE          IMAGING AND PROCEDURES  Imaging and Procedures for @TODAY @  OCT, Retina - OU - Both Eyes       Right Eye Quality was good. Central Foveal Thickness: 400. Findings include abnormal foveal contour, intraretinal fluid, intraretinal hyper-reflective material, no SRF (interval improvement in SRF; interval change in IRF distribution).   Left Eye Quality was good. Central Foveal Thickness: 449. Progression has worsened. Findings include abnormal foveal contour, intraretinal fluid, subretinal fluid, intraretinal hyper-reflective material (Interval increase in IRF/SRF).    Notes *Images captured and stored on drive  Diagnosis / Impression:  Severe DME OU OD: interval improvement in SRF; interval change in IRF distribution OS: Interval increase in IRF/SRF  Clinical management:  See below  Abbreviations: NFP - Normal foveal profile. CME - cystoid macular edema. PED - pigment epithelial detachment. IRF -  intraretinal fluid. SRF - subretinal fluid. EZ - ellipsoid zone. ERM - epiretinal membrane. ORA - outer retinal atrophy. ORT - outer retinal tubulation. SRHM - subretinal hyper-reflective material         Panretinal Photocoagulation - OS - Left Eye       LASER PROCEDURE NOTE  Diagnosis:   Proliferative Diabetic Retinopathy, LEFT EYE  Procedure:  Pan-retinal photocoagulation using slit lamp laser, LEFT EYE  Anesthesia:  Topical  Surgeon: [name], MD, PhD   Informed consent obtained, operative eye marked, and time out performed prior to initiation of laser.   Lumenis [id number] slit lamp laser Pattern: 3x3 square Power: 270 mW Duration: 30 msec  Spot size: 200 microns  # spots: [phone] spots  Complications: None.  RTC: 2-3 wks  Patient tolerated the procedure well and received written and verbal post-procedure care information/education.                  ASSESSMENT/PLAN:    ICD-10-CM   1. Proliferative diabetic retinopathy of both eyes with macular edema associated with type 2 diabetes mellitus ([place]) U13.2440 Panretinal Photocoagulation - OS - Left Eye  2. History of noncompliance with medical treatment Z91.19   3. Hypertensive retinopathy of both eyes H35.033   4. Essential hypertension I10   5. Retinal edema H35.81 OCT, Retina - OU - Both Eyes  6. Acute CVA (cerebrovascular accident) ([place]) I63.9     1,2. Proliferative diabetic retinopathy OU - pt lost to f/u since 10.2.19 at which time he received PRP laser OD - pt reports acute decrease in vision OU (OS > OD) over the weekend and presents acutely today - on exam,  OS shows conversion to proliferative DR from nonproliferative - The incidence, risk factors for progression, natural history and treatment options for diabetic retinopathy were discussed with patient.   - The need for close monitoring of blood glucose, blood pressure, and serum lipids, avoiding cigarette or any type of tobacco, and the need for long term follow up was also discussed with patient. - discussed the importance of compliance with medical therapy and follow ups - exam shows new NV OS, persistent NV OD, and worse DME, IRH, exudates and CWS OU - FA 8.19.19 shows leaking MA, patches of capillary nonperfusion OU - OCT shows macular edema OU, but suspect a lot of it also due to HTN (BP in office today 204/102) - recommend avoidance of anti-VEGF injections due to recent stroke - s/p PRP OD (10.02.19) - recommend PRP OS today, 11.20.19 - pt wishes to proceed - RBA of procedure discussed, questions answered - informed consent obtained and signed - see procedure note - start PF QID OS x7d - F/U 2-3 wks   3-5. 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 [name] in mid August 2019 with a 2 wk history intractable headaches and decreased vision OU - found to have BP of 170s /90s -- sent to ED and was [facility] to [facility] where SBP was up to 200 - discussed importance of tight BP control - BP in office today, 204/102 and pt reports recent headaches and noncompliance with BP medications - advised presentation to ED following visit for hypertensive urgency -- pt wishes to resume BP meds and recheck BP at home - discussed risk of stroke/MI with current BP readings -- pt verbalized understanding - monitor  6. Acute CVA secondary to uncontrolled HTN - MRI on 8.14.19: IMPRESSION: 1. Limited 2 sequence MRI head: Acute RIGHT  periatrial subcentimeter nonhemorrhagic infarct, in region of optic tract. - could be limiting vision -- will check formal visual field  once macular edema improves    Ophthalmic Meds Ordered this visit:  Meds ordered this encounter  Medications  . prednisoLONE acetate (PRED FORTE) 1 % ophthalmic suspension    Sig: Place 1 drop into the left eye 4 (four) times daily for 7 days.    Dispense:  10 mL    Refill:  0       Return for F/U 2-3 weeks, DFE, OCT, possible Laser.  There are no Patient Instructions on file for this visit.   Explained the diagnoses, plan, and follow up with the patient and they expressed understanding.  Patient expressed understanding of the importance of proper follow up care.   This document serves as a record of services personally performed by Gardiner Sleeper, MD, PhD. It was created on their behalf by Ernest Mallick, OA, an ophthalmic assistant. The creation of this record is the provider's dictation and/or activities during the visit.    Electronically signed by: Ernest Mallick, OA  11.20.19 5:27 PM    Gardiner Sleeper, M.D., Ph.D. Diseases & Surgery of the Retina and Vitreous [facility]   I have reviewed the above documentation for accuracy and completeness, and I agree with the above. Gardiner Sleeper, M.D., Ph.D. 05/02/18 5:27 PM    Abbreviations: M myopia (nearsighted); A astigmatism; H hyperopia (farsighted); P presbyopia; Mrx spectacle prescription;  CTL contact lenses; OD right eye; OS left eye; OU both eyes  XT exotropia; ET esotropia; PEK punctate epithelial keratitis; PEE punctate epithelial erosions; DES dry eye syndrome; MGD meibomian gland dysfunction; ATs artificial tears; PFAT's preservative free artificial tears; [place] nuclear sclerotic cataract; PSC posterior subcapsular cataract; ERM epi-retinal membrane; PVD posterior vitreous detachment; RD retinal detachment; DM diabetes mellitus; DR diabetic retinopathy; NPDR non-proliferative diabetic retinopathy; PDR proliferative diabetic retinopathy; CSME clinically significant macular edema; DME diabetic macular  edema; dbh dot blot hemorrhages; CWS cotton wool spot; POAG primary open angle glaucoma; C/D cup-to-disc ratio; HVF humphrey visual field; GVF goldmann visual field; OCT optical coherence tomography; IOP intraocular pressure; BRVO Branch retinal vein occlusion; CRVO central retinal vein occlusion; CRAO central retinal artery occlusion; BRAO branch retinal artery occlusion; RT retinal tear; SB scleral buckle; PPV pars plana vitrectomy; VH Vitreous hemorrhage; PRP panretinal laser photocoagulation; IVK intravitreal kenalog; VMT vitreomacular traction; MH Macular hole;  NVD neovascularization of the disc; NVE neovascularization elsewhere; AREDS age related eye disease study; ARMD age related macular degeneration; POAG primary open angle glaucoma; EBMD epithelial/anterior basement membrane dystrophy; ACIOL anterior chamber intraocular lens; IOL intraocular lens; PCIOL posterior chamber intraocular lens; Phaco/IOL phacoemulsification with intraocular lens placement; [place] photorefractive keratectomy; LASIK laser assisted in situ keratomileusis; HTN hypertension; DM diabetes mellitus; COPD chronic obstructive pulmonary disease

## 2018-05-15 NOTE — Progress Notes (Signed)
[facility] Note  05/16/2018     CHIEF COMPLAINT Patient presents for Retina Follow Up   HISTORY OF PRESENT ILLNESS: Bill [name] is a 37 y.o. male who presents to the clinic today for:   HPI    Retina Follow Up    Patient presents with  Diabetic Retinopathy.  In both eyes.  This started 3 months ago.  Severity is mild.  Since onset it is gradually improving.  I, the attending physician,  performed the HPI with the patient and updated documentation appropriately.          Comments    F/U PDR OU. Patient states vision is improving, denies new onsets/issues. Bs 140 (05/15/18),denies hypo/hyperglycemic episodes. Pt reports he has started new Bp RX (does not recall med.)       Last edited by [name], MD on 05/16/2018  4:43 PM. (History)    Pt states his vision has gotten blurrier over the weekend, he states it feels like it's mainly his left eye, pt states he's been having headaches also, pts blood pressure in office today was 200/100, pt states he has been taking his medications prescribed to him by his PCP for blood pressure, but he did not take it today  Referring physician: No referring provider defined for this encounter.  HISTORICAL INFORMATION:   Selected notes from the MEDICAL RECORD NUMBER Referred by Dr. [name] for concern of severe NPDR / PDR OU; LEE-  Ocular Hx-  PMH-     CURRENT MEDICATIONS: No current outpatient medications on file. (Ophthalmic Drugs)   No current facility-administered medications for this visit.  (Ophthalmic Drugs)   Current Outpatient Medications (Other)  Medication Sig  . amLODipine (NORVASC) 10 MG tablet Take 1 tablet (10 mg total) by mouth daily.  Marland Kitchen aspirin 81 MG EC tablet Take 1 tablet (81 mg total) by mouth daily.  Marland Kitchen atorvastatin (LIPITOR) 80 MG tablet Take 1 tablet (80 mg total) by mouth daily at 6 PM.  . Empagliflozin-metFORMIN HCl (SYNJARDY) 5-500 MG TABS Take 1 tablet by mouth daily.  Marland Kitchen  ibuprofen (ADVIL,MOTRIN) 800 MG tablet TAKE 1 TABLET BY MOUTH THREE TIMES A DAY FOR 10 DAYS  . telmisartan (MICARDIS) 80 MG tablet Take 80 mg by mouth daily.  . hydrochlorothiazide (HYDRODIURIL) 25 MG tablet Take 1 tablet (25 mg total) by mouth daily.   No current facility-administered medications for this visit.  (Other)      REVIEW OF SYSTEMS: ROS    Positive for: Endocrine, Eyes   Negative for: Constitutional, Gastrointestinal, Neurological, Skin, Genitourinary, Musculoskeletal, HENT, Cardiovascular, Respiratory, Psychiatric, Allergic/Imm, Heme/Lymph   Last edited by Zenovia Jordan, LPN on 32/11/7122  5:80 PM. (History)       ALLERGIES No Known Allergies  PAST MEDICAL HISTORY Past Medical History:  Diagnosis Date  . Diabetes mellitus without complication ([place])   . Stroke [facility])    Past Surgical History:  Procedure Laterality Date  . arm surgery      FAMILY HISTORY Family History  Problem Relation Age of Onset  . Diabetes Mellitus II Mother   . Hypertension Mother   . Cataracts Mother   . Diabetes Mellitus II Father   . Stroke Father   . Hypertension Father   . Glaucoma Maternal Grandmother   . Amblyopia Neg Hx   . Blindness Neg Hx   . Macular degeneration Neg Hx   . Retinal detachment Neg Hx   . Strabismus Neg Hx   .  Retinitis pigmentosa Neg Hx     SOCIAL HISTORY Social History   Tobacco Use  . Smoking status: Never Smoker  . Smokeless tobacco: Never Used  Substance Use Topics  . Alcohol use: Yes    Comment: Occasionally.  . Drug use: Not on file         OPHTHALMIC EXAM:  Base Eye Exam    Visual Acuity (Snellen - Linear)      Right Left   Dist [place] 20/50 -1 20/60   Dist ph [place] 20/25 -2 NI       Tonometry (Tonopen, 3:54 PM)      Right Left   Pressure 16 14       Pupils      Dark Light Shape React APD   Right 3 2 Round Brisk None   Left 3 2 Round Brisk None       Visual Fields (Counting fingers)      Left Right    Full Full        Extraocular Movement      Right Left    Full, Ortho Full, Ortho       Neuro/Psych    Oriented x3:  Yes   Mood/Affect:  Normal       Dilation    Both eyes:  1.0% Mydriacyl, 2.5% Phenylephrine @ 3:53 PM        Slit Lamp and Fundus Exam    Slit Lamp Exam      Right Left   Lids/Lashes Normal Normal   Conjunctiva/Sclera White and quiet White and quiet   Cornea Clear Clear   Anterior Chamber Deep and quiet Deep and quiet   Iris Round and dilated, No NVI Round and dilated   Lens 1+ Nuclear sclerosis, 1+ Cortical cataract 1+ Nuclear sclerosis, 1+ Cortical cataract   Vitreous Normal Normal       Fundus Exam      Right Left   Disc Pink and Sharp, +NVD-regressing, early fibrosis-regressing, new pre-retinal heme off temp disc and inf macula Pink and Sharp, +fine NVD-regressing    C/D Ratio 0.1 0.2   Macula Blunted foveal reflex, +edema, scattered IRH, scattered CWS, scattered Exudates, scattered NVE blunted foveal reflex, +edema, Scattered IRH, scattered CWS greatest superior macula, inferior Exudates, +NVE, early fibrosis temporal to fovea   Vessels Vascular attenuation, Tortuous, Copper wiring, +NVE Copper wiring, severe Vascular attenuation, Tortuous, AV crossing changes, +NVE   Periphery Attached, scattered DBH, 360 PRP in place, room for fill-in posteriorly Attached good early PRP laser changes 360 degrees, scattered DBH, +NVE          IMAGING AND PROCEDURES  Imaging and Procedures for @TODAY @  OCT, Retina - OU - Both Eyes       Right Eye Quality was good. Central Foveal Thickness: 304. Progression has improved. Findings include abnormal foveal contour, intraretinal fluid, intraretinal hyper-reflective material, no SRF (interval improvement in IRF and edema ).   Left Eye Quality was good. Central Foveal Thickness: 296. Progression has improved. Findings include abnormal foveal contour, intraretinal fluid, subretinal fluid, intraretinal hyper-reflective material (Interval  improvement in IRF/SRF).   Notes *Images captured and stored on drive  Diagnosis / Impression:  Severe DME OU OD: interval improvement in IRF and edema  OS: Interval improvement in IRF/SRF  Clinical management:  See below  Abbreviations: NFP - Normal foveal profile. CME - cystoid macular edema. PED - pigment epithelial detachment. IRF - intraretinal fluid. SRF - subretinal fluid. EZ - ellipsoid zone. ERM -  epiretinal membrane. ORA - outer retinal atrophy. ORT - outer retinal tubulation. SRHM - subretinal hyper-reflective material                  ASSESSMENT/PLAN:    ICD-10-CM   1. Proliferative diabetic retinopathy of both eyes with macular edema associated with type 2 diabetes mellitus ([place]) K35.4656   2. History of noncompliance with medical treatment Z91.19   3. Hypertensive retinopathy of both eyes H35.033   4. Essential hypertension I10   5. Retinal edema H35.81 OCT, Retina - OU - Both Eyes  6. Acute CVA (cerebrovascular accident) ([place]) I63.9     1,2. Proliferative diabetic retinopathy OU - pt lost to f/u since 10.2.19 at which time he received PRP laser OD - previously, OS showed conversion to proliferative DR from nonproliferative - s/p PRP OD (10.02.19), PRP OS (05/02/2018) - today, vision improved from 20/40 to 20/25 OD and 20/150 to 20/60 OS today - exam shows regressing NV and improving DME, IRH, exudates and CWS OU - discussed the importance of compliance with medical therapy and follow ups - FA 8.19.19 shows leaking MA, patches of capillary nonperfusion OU - OCT shows improvement in macular edema OU today - recommend avoidance of anti-VEGF injections due to recent stroke - recommend laser PRP fill-in OD due to new bleed OD -- pt unable to stay for laser procedure -- wishes to reschedule for next week  3-5. 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 [name] in mid August 2019 with a 2 wk history intractable headaches and  decreased vision OU - found to have BP of 170s /90s -- sent to ED and was admitted to [facility] where SBP was up to 200 - discussed importance of tight BP control - pt reports improved compliance with BP medications and improvement in BP  6. Acute CVA secondary to uncontrolled HTN - MRI on 8.14.19: IMPRESSION: 1. Limited 2 sequence MRI head: Acute RIGHT periatrial subcentimeter nonhemorrhagic infarct, in region of optic tract. - could be limiting vision -- will check formal visual field once macular edema improves    Ophthalmic Meds Ordered this visit:  No orders of the defined types were placed in this encounter.      Return 05/22/2018, for PRP OD.  There are no Patient Instructions on file for this visit.   Explained the diagnoses, plan, and follow up with the patient and they expressed understanding.  Patient expressed understanding of the importance of proper follow up care.   This document serves as a record of services personally performed by Gardiner Sleeper, MD, PhD. It was created on their behalf by Ernest Mallick, OA, an ophthalmic assistant. The creation of this record is the provider's dictation and/or activities during the visit.    Electronically signed by: Ernest Mallick, OA  12.03.19 2:33 AM    Gardiner Sleeper, M.D., Ph.D. Diseases & Surgery of the Retina and Vitreous [facility]  I have reviewed the above documentation for accuracy and completeness, and I agree with the above. Gardiner Sleeper, M.D., Ph.D. 05/20/18 2:33 AM   Abbreviations: M myopia (nearsighted); A astigmatism; H hyperopia (farsighted); P presbyopia; Mrx spectacle prescription;  CTL contact lenses; OD right eye; OS left eye; OU both eyes  XT exotropia; ET esotropia; PEK punctate epithelial keratitis; PEE punctate epithelial erosions; DES dry eye syndrome; MGD meibomian gland dysfunction; ATs artificial tears; PFAT's preservative free artificial tears; [place] nuclear  sclerotic cataract; [place]  posterior subcapsular cataract; ERM epi-retinal membrane; PVD posterior vitreous detachment; RD retinal detachment; DM diabetes mellitus; DR diabetic retinopathy; NPDR non-proliferative diabetic retinopathy; PDR proliferative diabetic retinopathy; CSME clinically significant macular edema; DME diabetic macular edema; dbh dot blot hemorrhages; CWS cotton wool spot; POAG primary open angle glaucoma; C/D cup-to-disc ratio; HVF humphrey visual field; GVF goldmann visual field; OCT optical coherence tomography; IOP intraocular pressure; BRVO Branch retinal vein occlusion; CRVO central retinal vein occlusion; CRAO central retinal artery occlusion; BRAO branch retinal artery occlusion; RT retinal tear; SB scleral buckle; PPV pars plana vitrectomy; VH Vitreous hemorrhage; PRP panretinal laser photocoagulation; IVK intravitreal kenalog; VMT vitreomacular traction; MH Macular hole;  NVD neovascularization of the disc; NVE neovascularization elsewhere; AREDS age related eye disease study; ARMD age related macular degeneration; POAG primary open angle glaucoma; EBMD epithelial/anterior basement membrane dystrophy; ACIOL anterior chamber intraocular lens; IOL intraocular lens; PCIOL posterior chamber intraocular lens; Phaco/IOL phacoemulsification with intraocular lens placement; [place] photorefractive keratectomy; LASIK laser assisted in situ keratomileusis; HTN hypertension; DM diabetes mellitus; COPD chronic obstructive pulmonary disease

## 2018-05-16 ENCOUNTER — Encounter (INDEPENDENT_AMBULATORY_CARE_PROVIDER_SITE_OTHER): Payer: Self-pay | Admitting: Ophthalmology

## 2018-05-16 ENCOUNTER — Ambulatory Visit (INDEPENDENT_AMBULATORY_CARE_PROVIDER_SITE_OTHER): Payer: 59 | Admitting: Ophthalmology

## 2018-05-16 VITALS — BP 156/91 | HR 84

## 2018-05-16 DIAGNOSIS — Z9119 Patient's noncompliance with other medical treatment and regimen: Secondary | ICD-10-CM | POA: Diagnosis not present

## 2018-05-16 DIAGNOSIS — E113513 Type 2 diabetes mellitus with proliferative diabetic retinopathy with macular edema, bilateral: Secondary | ICD-10-CM

## 2018-05-16 DIAGNOSIS — H3581 Retinal edema: Secondary | ICD-10-CM

## 2018-05-16 DIAGNOSIS — I10 Essential (primary) hypertension: Secondary | ICD-10-CM | POA: Diagnosis not present

## 2018-05-16 DIAGNOSIS — Z91199 Patient's noncompliance with other medical treatment and regimen due to unspecified reason: Secondary | ICD-10-CM

## 2018-05-16 DIAGNOSIS — I639 Cerebral infarction, unspecified: Secondary | ICD-10-CM

## 2018-05-16 DIAGNOSIS — I6389 Other cerebral infarction: Secondary | ICD-10-CM

## 2018-05-16 DIAGNOSIS — H35033 Hypertensive retinopathy, bilateral: Secondary | ICD-10-CM | POA: Diagnosis not present

## 2018-05-20 ENCOUNTER — Encounter (INDEPENDENT_AMBULATORY_CARE_PROVIDER_SITE_OTHER): Payer: Self-pay | Admitting: Ophthalmology

## 2018-05-22 ENCOUNTER — Encounter (INDEPENDENT_AMBULATORY_CARE_PROVIDER_SITE_OTHER): Payer: MEDICAID | Admitting: Ophthalmology

## 2018-07-03 NOTE — Progress Notes (Signed)
[facility] Note  07/04/2018     CHIEF COMPLAINT Patient presents for Retina Follow Up   HISTORY OF PRESENT ILLNESS: Bill [name] is a 38 y.o. male who presents to the clinic today for:   HPI    Retina Follow Up    Patient presents with  Diabetic Retinopathy.  In both eyes.  This started 3.  Since onset it is stable.  I, the attending physician,  performed the HPI with the patient and updated documentation appropriately.          Comments    F/U PDR OU w/ ME. Patient staes his vision is about the same as last ov, denies new visual onsets/isses. Pt is for tx today if indicted Bs 140 (07/03/18), Bs have been [facility] per patient.       Last edited by [name], MD on 07/04/2018  8:38 AM. (History)    Pt states his vision has gotten blurrier over the weekend, he states it feels like it's mainly his left eye, pt states he's been having headaches also, pts blood pressure in office today was 200/100, pt states he has been taking his medications prescribed to him by his PCP for blood pressure, but he did not take it today  Referring physician: No referring provider defined for this encounter.  HISTORICAL INFORMATION:   Selected notes from the MEDICAL RECORD NUMBER Referred by Dr. [name] for concern of severe NPDR / PDR OU; LEE-  Ocular Hx-  PMH-     CURRENT MEDICATIONS: No current outpatient medications on file. (Ophthalmic Drugs)   No current facility-administered medications for this visit.  (Ophthalmic Drugs)   Current Outpatient Medications (Other)  Medication Sig  . amLODipine (NORVASC) 10 MG tablet Take 1 tablet (10 mg total) by mouth daily.  Marland Kitchen aspirin 81 MG EC tablet Take 1 tablet (81 mg total) by mouth daily.  Marland Kitchen atorvastatin (LIPITOR) 80 MG tablet Take 1 tablet (80 mg total) by mouth daily at 6 PM.  . Empagliflozin-metFORMIN HCl (SYNJARDY) 5-500 MG TABS Take 1 tablet by mouth daily.  Marland Kitchen ibuprofen (ADVIL,MOTRIN) 800 MG tablet TAKE 1  TABLET BY MOUTH THREE TIMES A DAY FOR 10 DAYS  . telmisartan (MICARDIS) 80 MG tablet Take 80 mg by mouth daily.  . hydrochlorothiazide (HYDRODIURIL) 25 MG tablet Take 1 tablet (25 mg total) by mouth daily.   Current Facility-Administered Medications (Other)  Medication Route  . triamcinolone acetonide (KENALOG-40) injection 4 mg Intravitreal      REVIEW OF SYSTEMS: ROS    Positive for: Endocrine, Eyes   Negative for: Constitutional, Gastrointestinal, Neurological, Skin, Genitourinary, Musculoskeletal, HENT, Cardiovascular, Respiratory, Psychiatric, Allergic/Imm, Heme/Lymph   Last edited by Zenovia Jordan, LPN on 1/61/0960  4:54 AM. (History)       ALLERGIES No Known Allergies  PAST MEDICAL HISTORY Past Medical History:  Diagnosis Date  . Diabetes mellitus without complication ([place])   . Stroke [facility])    Past Surgical History:  Procedure Laterality Date  . arm surgery      FAMILY HISTORY Family History  Problem Relation Age of Onset  . Diabetes Mellitus II Mother   . Hypertension Mother   . Cataracts Mother   . Diabetes Mellitus II Father   . Stroke Father   . Hypertension Father   . Glaucoma Maternal Grandmother   . Amblyopia Neg Hx   . Blindness Neg Hx   . Macular degeneration Neg Hx   . Retinal detachment Neg Hx   .  Strabismus Neg Hx   . Retinitis pigmentosa Neg Hx     SOCIAL HISTORY Social History   Tobacco Use  . Smoking status: Never Smoker  . Smokeless tobacco: Never Used  Substance Use Topics  . Alcohol use: Yes    Comment: Occasionally.  . Drug use: Not on file         OPHTHALMIC EXAM:  Base Eye Exam    Visual Acuity (Snellen - Linear)      Right Left   Dist [place] 20/60 -1 20/60   Dist ph [place] 20/40 -2 20/40       Tonometry (Tonopen, 8:18 AM)      Right Left   Pressure 17 15       Pupils      Dark Light Shape React APD   Right 4 3 Round Brisk None   Left 4 3 Round Brisk None       Visual Fields (Counting fingers)      Left  Right    Full Full       Extraocular Movement      Right Left    Full, Ortho Full, Ortho       Neuro/Psych    Oriented x3:  Yes   Mood/Affect:  Normal       Dilation    Both eyes:  1.0% Mydriacyl, 2.5% Phenylephrine @ 8:18 AM        Slit Lamp and Fundus Exam    Slit Lamp Exam      Right Left   Lids/Lashes Normal Normal   Conjunctiva/Sclera White and quiet White and quiet   Cornea Clear Clear   Anterior Chamber Deep and quiet Deep and quiet   Iris Round and dilated, No NVI Round and dilated   Lens 1+ Nuclear sclerosis, 1+ Cortical cataract 1+ Nuclear sclerosis, 1+ Cortical cataract   Vitreous new preretinal and vitreous hemorrhage posterior and centrally Normal       Fundus Exam      Right Left   Disc Pink and Sharp, +NVD-regressing, early fibrosis-regressing, new pre-retinal heme off temp disc and inf macula Pink and Sharp, +fine NVD-regressing    C/D Ratio 0.1 0.2   Macula Blunted foveal reflex, +edema, scattered IRH, scattered CWS, scattered Exudates, scattered NVE blunted foveal reflex, +edema, Scattered IRH, scattered CWS greatest superior macula, inferior Exudates, +NVE, early fibrosis temporal to fovea   Vessels Vascular attenuation, Tortuous, Copper wiring, +NVE Copper wiring, severe Vascular attenuation, Tortuous, AV crossing changes, +NVE   Periphery Attached, scattered DBH, 360 PRP in place, room for fill-in posteriorly Attached; good PRP laser changes 360 degrees, scattered DBH, +NVE          IMAGING AND PROCEDURES  Imaging and Procedures for @TODAY @  OCT, Retina - OU - Both Eyes       Right Eye Quality was good. Central Foveal Thickness: 304. Progression has worsened. Findings include abnormal foveal contour, intraretinal fluid, intraretinal hyper-reflective material, subretinal fluid (New large vertical preretinal hemorrhage and new peripapillary pocket of SRF).   Left Eye Quality was good. Central Foveal Thickness: 259. Progression has improved.  Findings include abnormal foveal contour, intraretinal fluid, intraretinal hyper-reflective material, no SRF (Interval improvement in IRF/SRF and foveal profile).   Notes *Images captured and stored on drive  Diagnosis / Impression:  Severe DME OU OD: New large vertical preretinal hemorrhage and new peripapillary pocket of SRF; persistent IRF OS: Interval improvement in IRF/SRF and foveal profile  Clinical management:  See below  Abbreviations:  NFP - Normal foveal profile. CME - cystoid macular edema. PED - pigment epithelial detachment. IRF - intraretinal fluid. SRF - subretinal fluid. EZ - ellipsoid zone. ERM - epiretinal membrane. ORA - outer retinal atrophy. ORT - outer retinal tubulation. SRHM - subretinal hyper-reflective material         Intravitreal Injection, Pharmacologic Agent - OD - Right Eye       Time Out 07/04/2018. 9:36 AM. Confirmed correct patient, procedure, site, and patient consented.   Anesthesia Topical anesthesia was used. Anesthetic medications included Lidocaine 2%, Proparacaine 0.5%.   Procedure Preparation included 5% betadine to ocular surface, eyelid speculum. A 27 gauge needle was used.   Injection:  4 mg triamcinolone acetonide (KENALOG-40) injection   NDC: 2694-8546-27, Lot: [id number], Expiration date: 03/13/2019   Route: Intravitreal, Site: Right Eye  Post-op Post injection exam found visual acuity of at least counting fingers. The patient tolerated the procedure well. There were no complications. The patient received written and verbal post procedure care education.   Notes 0.1 cc of triamcinolone acetonide injected into vitreous cavity 4 mm posterior to inferotemporal limbus. Remaining 0.9 cc of vial wasted.       Panretinal Photocoagulation - OD - Right Eye       LASER PROCEDURE NOTE  Diagnosis:   Proliferative Diabetic Retinopathy, RIGHT EYE  Procedure:  Pan-retinal photocoagulation using slit lamp laser, RIGHT EYE,  fill-in  Anesthesia:  Topical  Surgeon: [name], MD, PhD   Informed consent obtained, operative eye marked, and time out performed prior to initiation of laser.   Lumenis [id number] slit lamp laser Pattern: 3x3 square Power: 280 mW Duration: 30 msec  Spot size: 200 microns  # spots: 1696 spots   Complications: None  Notes: new preretinal and vitreous hemorrhage obscuring view and preventing laser uptake in scattered focal areas, greatest inferiorly.  RTC: 1-2 wks  Patient tolerated the procedure well and received written and verbal post-procedure care information/education.                  ASSESSMENT/PLAN:    ICD-10-CM   1. Proliferative diabetic retinopathy of both eyes with macular edema associated with type 2 diabetes mellitus (HCC) V89.3810 Intravitreal Injection, Pharmacologic Agent - OD - Right Eye    Panretinal Photocoagulation - OD - Right Eye    triamcinolone acetonide (KENALOG-40) injection 4 mg  2. History of noncompliance with medical treatment Z91.19   3. Hypertensive retinopathy of both eyes H35.033   4. Essential hypertension I10   5. Retinal edema H35.81 OCT, Retina - OU - Both Eyes  6. Acute CVA (cerebrovascular accident) ([place]) I63.9     1,2. Proliferative diabetic retinopathy OU - s/p PRP OD (10.02.19), PRP OS (05/02/2018) - today, BCVA 20/40 from 20/25 OD -- new VH and preretinal hemorrhage; OS 20/40 from 20/60 - exam shows new VH and preretinal heme OD, but improving DME OU, IRH, exudates and CWS OU - discussed the importance of compliance with medical therapy and follow ups - FA 8.19.19 shows leaking MA, patches of capillary nonperfusion OU - OCT shows interval improvement improvement in macular edema OU today - recommend avoidance of anti-VEGF injections due to recent stroke - recommend laser PRP fill-in OD due to new bleed OD and IVTA OD for DME - RBA of procedure discussed, questions answered - informed consent obtained and  signed - see procedure notes  3-5. 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 [name] in mid August 2019 with a  2 wk history intractable headaches and decreased vision OU - found to have BP of 170s /90s -- sent to ED and was admitted to [facility] where SBP was up to 200 - discussed importance of tight BP control - today (1.22.2020) BP 191/103 - pt reports improved compliance with BP medications and improvement in BP  6. Acute CVA secondary to uncontrolled HTN - MRI on 8.14.19: IMPRESSION: 1. Limited 2 sequence MRI head: Acute RIGHT periatrial subcentimeter nonhemorrhagic infarct, in region of optic tract. - could be limiting vision -- will check formal visual field once macular edema improves    Ophthalmic Meds Ordered this visit:  Meds ordered this encounter  Medications  . triamcinolone acetonide (KENALOG-40) injection 4 mg       Return in about 1 week (around 07/11/2018) for Laser, Possible Injxn OS.  There are no Patient Instructions on file for this visit.   Explained the diagnoses, plan, and follow up with the patient and they expressed understanding.  Patient expressed understanding of the importance of proper follow up care.   This document serves as a record of services personally performed by Gardiner Sleeper, MD, PhD. It was created on their behalf by Ernest Mallick, OA, an ophthalmic assistant. The creation of this record is the provider's dictation and/or activities during the visit.    Electronically signed by: Ernest Mallick, OA  01.21.2020 3:32 PM     Gardiner Sleeper, M.D., Ph.D. Diseases & Surgery of the Retina and Vitreous [facility]  I have reviewed the above documentation for accuracy and completeness, and I agree with the above. Gardiner Sleeper, M.D., Ph.D. 07/07/18 3:47 PM     Abbreviations: M myopia (nearsighted); A astigmatism; H hyperopia (farsighted); P presbyopia; Mrx spectacle  prescription;  CTL contact lenses; OD right eye; OS left eye; OU both eyes  XT exotropia; ET esotropia; PEK punctate epithelial keratitis; PEE punctate epithelial erosions; DES dry eye syndrome; MGD meibomian gland dysfunction; ATs artificial tears; PFAT's preservative free artificial tears; [place] nuclear sclerotic cataract; PSC posterior subcapsular cataract; ERM epi-retinal membrane; PVD posterior vitreous detachment; RD retinal detachment; DM diabetes mellitus; DR diabetic retinopathy; NPDR non-proliferative diabetic retinopathy; PDR proliferative diabetic retinopathy; CSME clinically significant macular edema; DME diabetic macular edema; dbh dot blot hemorrhages; CWS cotton wool spot; POAG primary open angle glaucoma; C/D cup-to-disc ratio; HVF humphrey visual field; GVF goldmann visual field; OCT optical coherence tomography; IOP intraocular pressure; BRVO Branch retinal vein occlusion; CRVO central retinal vein occlusion; CRAO central retinal artery occlusion; BRAO branch retinal artery occlusion; RT retinal tear; SB scleral buckle; PPV pars plana vitrectomy; VH Vitreous hemorrhage; PRP panretinal laser photocoagulation; IVK intravitreal kenalog; VMT vitreomacular traction; MH Macular hole;  NVD neovascularization of the disc; NVE neovascularization elsewhere; AREDS age related eye disease study; ARMD age related macular degeneration; POAG primary open angle glaucoma; EBMD epithelial/anterior basement membrane dystrophy; ACIOL anterior chamber intraocular lens; IOL intraocular lens; PCIOL posterior chamber intraocular lens; Phaco/IOL phacoemulsification with intraocular lens placement; [place] photorefractive keratectomy; LASIK laser assisted in situ keratomileusis; HTN hypertension; DM diabetes mellitus; COPD chronic obstructive pulmonary disease

## 2018-07-04 ENCOUNTER — Encounter (INDEPENDENT_AMBULATORY_CARE_PROVIDER_SITE_OTHER): Payer: Self-pay | Admitting: Ophthalmology

## 2018-07-04 ENCOUNTER — Ambulatory Visit (INDEPENDENT_AMBULATORY_CARE_PROVIDER_SITE_OTHER): Payer: MEDICAID | Admitting: Ophthalmology

## 2018-07-04 VITALS — BP 191/103 | HR 87

## 2018-07-04 DIAGNOSIS — I639 Cerebral infarction, unspecified: Secondary | ICD-10-CM

## 2018-07-04 DIAGNOSIS — H3581 Retinal edema: Secondary | ICD-10-CM

## 2018-07-04 DIAGNOSIS — H35033 Hypertensive retinopathy, bilateral: Secondary | ICD-10-CM | POA: Diagnosis not present

## 2018-07-04 DIAGNOSIS — Z91199 Patient's noncompliance with other medical treatment and regimen due to unspecified reason: Secondary | ICD-10-CM

## 2018-07-04 DIAGNOSIS — E113513 Type 2 diabetes mellitus with proliferative diabetic retinopathy with macular edema, bilateral: Secondary | ICD-10-CM | POA: Diagnosis not present

## 2018-07-04 DIAGNOSIS — I10 Essential (primary) hypertension: Secondary | ICD-10-CM

## 2018-07-04 DIAGNOSIS — Z9119 Patient's noncompliance with other medical treatment and regimen: Secondary | ICD-10-CM

## 2018-07-04 MED ORDER — TRIAMCINOLONE ACETONIDE 40 MG/ML IJ SUSP FOR KALEIDOSCOPE
4.0000 mg | INTRAMUSCULAR | Status: DC
  Administered 2018-07-04: 4 mg via INTRAVITREAL

## 2018-07-11 ENCOUNTER — Encounter (INDEPENDENT_AMBULATORY_CARE_PROVIDER_SITE_OTHER): Payer: MEDICAID | Admitting: Ophthalmology

## 2018-07-18 ENCOUNTER — Encounter (INDEPENDENT_AMBULATORY_CARE_PROVIDER_SITE_OTHER): Payer: 59 | Admitting: Ophthalmology

## 2018-07-25 ENCOUNTER — Encounter (INDEPENDENT_AMBULATORY_CARE_PROVIDER_SITE_OTHER): Payer: Self-pay | Admitting: Ophthalmology

## 2018-07-25 ENCOUNTER — Ambulatory Visit (INDEPENDENT_AMBULATORY_CARE_PROVIDER_SITE_OTHER): Payer: 59 | Admitting: Ophthalmology

## 2018-07-25 VITALS — BP 149/79 | HR 80

## 2018-07-25 DIAGNOSIS — I10 Essential (primary) hypertension: Secondary | ICD-10-CM

## 2018-07-25 DIAGNOSIS — E113513 Type 2 diabetes mellitus with proliferative diabetic retinopathy with macular edema, bilateral: Secondary | ICD-10-CM

## 2018-07-25 DIAGNOSIS — H3581 Retinal edema: Secondary | ICD-10-CM

## 2018-07-25 DIAGNOSIS — H35033 Hypertensive retinopathy, bilateral: Secondary | ICD-10-CM

## 2018-07-25 DIAGNOSIS — Z9119 Patient's noncompliance with other medical treatment and regimen: Secondary | ICD-10-CM

## 2018-07-25 DIAGNOSIS — I639 Cerebral infarction, unspecified: Secondary | ICD-10-CM

## 2018-07-25 DIAGNOSIS — Z91199 Patient's noncompliance with other medical treatment and regimen due to unspecified reason: Secondary | ICD-10-CM

## 2018-07-25 MED ORDER — PREDNISOLONE ACETATE 1 % OP SUSP: 1.0000 [drp] | Freq: Four times a day (QID) | OPHTHALMIC | 0 refills | Status: DC

## 2018-07-25 MED ORDER — PREDNISOLONE ACETATE 1 % OP SUSP: 1.0000 [drp] | Freq: Four times a day (QID) | OPHTHALMIC | 0 refills | Status: AC

## 2018-07-25 NOTE — Progress Notes (Signed)
[facility] Note  07/25/2018     CHIEF COMPLAINT Patient presents for Blurred Vision   HISTORY OF PRESENT ILLNESS: Bill [name] is a 38 y.o. male who presents to the clinic today for:   HPI    Blurred Vision    In right eye.  Onset was sudden.  Vision is blurred and hazy.  Severity is severe.  This started 1 day ago.  Occurring constantly.  It is worse in the morning, in the evening and throughout the day.  Context:  distance vision, mid-range vision and near vision.  Since onset it is stable.  Associated symptoms include redness.  Treatments tried include no treatments.  I, the attending physician,  performed the HPI with the patient and updated documentation appropriately.          Comments    38 y/o male pt walked into clinic this morning c/o redness and drastically blurred VA OD since yesterday afternoon.  Nothing seems to have triggered symptoms.  No change noticed in [place] OS.  Denies pain, flashes, floaters.  No gtts.  Being followed for PDR w/mac edema OU.  BS 2 days ago was 150.  A1C unknown.       Last edited by [name], MD on 07/25/2018 12:09 PM. (History)    pt states he missed his last appt bc he couldn't get off work, he states his vision started getting worse yesterday in his right eye and his eye feels like it's dilated, he states he is unsure what could have caused it, pt states he could tell his vision got better after he received the injection of kenalog   Referring physician: No referring provider defined for this encounter.  HISTORICAL INFORMATION:   Selected notes from the MEDICAL RECORD NUMBER Referred by Dr. [name] for concern of severe NPDR / PDR OU; LEE-  Ocular Hx-  PMH-     CURRENT MEDICATIONS: Current Outpatient Medications (Ophthalmic Drugs)  Medication Sig  . prednisoLONE acetate (PRED FORTE) 1 % ophthalmic suspension Place 1 drop into the right eye 4 (four) times daily for 7 days.   No current  facility-administered medications for this visit.  (Ophthalmic Drugs)   Current Outpatient Medications (Other)  Medication Sig  . amLODipine (NORVASC) 10 MG tablet Take 1 tablet (10 mg total) by mouth daily.  Marland Kitchen aspirin 81 MG EC tablet Take 1 tablet (81 mg total) by mouth daily.  Marland Kitchen atorvastatin (LIPITOR) 80 MG tablet Take 1 tablet (80 mg total) by mouth daily at 6 PM.  . Empagliflozin-metFORMIN HCl (SYNJARDY) 5-500 MG TABS Take 1 tablet by mouth daily.  . hydrochlorothiazide (HYDRODIURIL) 25 MG tablet Take 1 tablet (25 mg total) by mouth daily.  Marland Kitchen ibuprofen (ADVIL,MOTRIN) 800 MG tablet TAKE 1 TABLET BY MOUTH THREE TIMES A DAY FOR 10 DAYS  . telmisartan (MICARDIS) 80 MG tablet Take 80 mg by mouth daily.   Current Facility-Administered Medications (Other)  Medication Route  . triamcinolone acetonide (KENALOG-40) injection 4 mg Intravitreal  . triamcinolone acetonide (KENALOG-40) injection 4 mg Intravitreal      REVIEW OF SYSTEMS: ROS    Positive for: Endocrine, Eyes   Negative for: Constitutional, Gastrointestinal, Neurological, Skin, Genitourinary, Musculoskeletal, HENT, Cardiovascular, Respiratory, Psychiatric, Allergic/Imm, Heme/Lymph   Last edited by Matthew Folks, COA on 07/25/2018 10:48 AM. (History)       ALLERGIES No Known Allergies  PAST MEDICAL HISTORY Past Medical History:  Diagnosis Date  . Diabetes mellitus without complication ([place])   .  Diabetic retinopathy ([place])    PDR OU  . Stroke [facility])    Past Surgical History:  Procedure Laterality Date  . arm surgery      FAMILY HISTORY Family History  Problem Relation Age of Onset  . Diabetes Mellitus II Mother   . Hypertension Mother   . Cataracts Mother   . Diabetes Mellitus II Father   . Stroke Father   . Hypertension Father   . Glaucoma Maternal Grandmother   . Amblyopia Neg Hx   . Blindness Neg Hx   . Macular degeneration Neg Hx   . Retinal detachment Neg Hx   . Strabismus Neg Hx   . Retinitis  pigmentosa Neg Hx     SOCIAL HISTORY Social History   Tobacco Use  . Smoking status: Never Smoker  . Smokeless tobacco: Never Used  Substance Use Topics  . Alcohol use: Yes    Comment: Occasionally.  . Drug use: Not on file         OPHTHALMIC EXAM:  Base Eye Exam    Visual Acuity (Snellen - Linear)      Right Left   Dist [place] 20/300 - 20/70 -2   Dist ph [place] NI 20/40       Tonometry (Tonopen, 10:50 AM)      Right Left   Pressure 18 11       Pupils      Dark Light Shape React APD   Right 4 3 Round Brisk None   Left 4 3 Round Brisk None       Visual Fields (Counting fingers)      Left Right    Full Full       Extraocular Movement      Right Left    Full, Ortho Full, Ortho       Neuro/Psych    Oriented x3:  Yes   Mood/Affect:  Normal       Dilation    Both eyes:  1.0% Mydriacyl, 2.5% Phenylephrine @ 10:50 AM        Slit Lamp and Fundus Exam    Slit Lamp Exam      Right Left   Lids/Lashes Normal Normal   Conjunctiva/Sclera White and quiet White and quiet   Cornea Clear Clear   Anterior Chamber Deep and quiet Deep and quiet   Iris Round and dilated, No NVI Round and dilated   Lens 1+ Nuclear sclerosis, 1+ Cortical cataract 1+ Nuclear sclerosis, 1+ Cortical cataract   Vitreous Mild VH / residual kenalog  Normal       Fundus Exam      Right Left   Disc Fine NVD, mild fibrosis Pink and Sharp, NVD   C/D Ratio 0.1 0.2   Macula Sub-hyaloid heme settling along inferior macula and IT arcades, vertical pre-retinal heme along ST arcades, scattered exudates and DBH Scattered exudates, IRH, +edema   Vessels Vascular attenuation +NVE along arcades   Periphery Attached, scattered DBH, 360 PRP in place, room for fill-in posteriorly    Attached; good PRP laser changes 360 degrees room for fill-in          IMAGING AND PROCEDURES  Imaging and Procedures for _0 @  OCT, Retina - OU - Both Eyes       Right Eye Quality was good. Central Foveal  Thickness: 636. Progression has worsened. Findings include abnormal foveal contour, intraretinal fluid, intraretinal hyper-reflective material, subretinal fluid (Interval worsening of sub-hyaloid heme, interval improvement in IRF/SRF).   Left Eye  Quality was good. Central Foveal Thickness: 288. Progression has worsened. Findings include abnormal foveal contour, intraretinal fluid, intraretinal hyper-reflective material, no SRF (Interval increase in IRF/IRHM).   Notes *Images captured and stored on drive  Diagnosis / Impression:  Severe DME OU CH:ENIDPOEU worsening of sub-hyaloid heme, interval improvement in IRF/SRF  OS: Interval increase in IRF/IRHM  Clinical management:  See below  Abbreviations: NFP - Normal foveal profile. CME - cystoid macular edema. PED - pigment epithelial detachment. IRF - intraretinal fluid. SRF - subretinal fluid. EZ - ellipsoid zone. ERM - epiretinal membrane. ORA - outer retinal atrophy. ORT - outer retinal tubulation. SRHM - subretinal hyper-reflective material         Intravitreal Injection, Pharmacologic Agent - OS - Left Eye       Time Out 07/25/2018. 1:15 PM. Confirmed correct patient, procedure, site, and patient consented.   Anesthesia Topical anesthesia was used. Anesthetic medications included Lidocaine 2%, Proparacaine 0.5%.   Procedure Preparation included 5% betadine to ocular surface, eyelid speculum. A 27 gauge needle was used.   Injection:  4 mg triamcinolone acetonide (KENALOG-40) injection   NDC: [address]-1, Lot: [id number], Expiration date: 07/13/2019   Route: Intravitreal, Site: Left Eye  Post-op Post injection exam found visual acuity of at least counting fingers. The patient tolerated the procedure well. There were no complications. The patient received written and verbal post procedure care education.   Notes 0.1 cc (4 mg) of kenalog-40 administered via 27g needle, 4 mm posterior to limbus, inferotemporal quadrant  An AC  tap was performed following injection due to elevated IOP using a 30 gauge needle on a syringe with the plunger removed. The needle was placed at the limbus at 5 oclock and approximately 0.08 cc of aqueous was removed from the anterior chamber. Betadine was applied to the tap area before and after the paracentesis was performed. There were no complications. The patient tolerated the procedure well. The IOP was rechecked and was found to be 5 mmHg by tonopen.         Panretinal Photocoagulation - OD - Right Eye       LASER PROCEDURE NOTE  Diagnosis:   Proliferative Diabetic Retinopathy, RIGHT EYE  Procedure:  Pan-retinal photocoagulation using slit lamp laser, RIGHT EYE, fill-in  Anesthesia:  Topical  Surgeon: [name], MD, PhD   Informed consent obtained, operative eye marked, and time out performed prior to initiation of laser.   Lumenis [id number] slit lamp laser Pattern: 2x2, 3x3 square Power: 240 mW Duration: 30 msec  Spot size: 200 microns  # spots: 1005 spots -- posterior fill-in  Complications: None  Notes: new preretinal and vitreous hemorrhage obscuring view and preventing laser uptake in scattered focal areas, greatest inferiorly.  RTC: 1 wk for PRP OS  Patient tolerated the procedure well and received written and verbal post-procedure care information/education.                  ASSESSMENT/PLAN:   1,2. Proliferative diabetic retinopathy OU - s/p PRP OD (10.02.19), fill-in (01.22.20) PRP OS (05/02/2018) - s/p IVK #1 OD 01.22.20 - today, BCVA 20/300- from 20/40 OD -- new VH and preretinal hemorrhage; OS stable at 20/40  - exam shows new VH and preretinal heme OD, but improving DME OU, IRH, exudates and CWS OU - discussed the importance of compliance with medical therapy and follow ups - FA 8.19.19 shows leaking MA, patches of capillary nonperfusion OU - OCT shows interval worsening in macular edema OS today, but interval  improvement OD s/p IVK  under heme - recommend avoidance of anti-VEGF injections due to recent stroke - recommend laser PRP fill-in OD due to new bleed and Kenalog injection OS today 02.12.20 - RBA of procedure discussed, questions answered - informed consent obtained and signed - see procedure notes - start PF QID OD x7 days - f/u 1 week for PRP fill in OS  3-5. 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 [name] in mid August 2019 with a 2 wk history intractable headaches and decreased vision OU - found to have BP of 170s /90s -- sent to ED and was admitted to [facility] where SBP was up to 200 - discussed importance of tight BP control - today (02.12.2020) BP 149/79 - pt reports improved compliance with BP medications and improvement in BP  6. Acute CVA secondary to uncontrolled HTN - MRI on 8.14.19: IMPRESSION: 1. Limited 2 sequence MRI head: Acute RIGHT periatrial subcentimeter nonhemorrhagic infarct, in region of optic tract. - could be limiting vision -- will check formal visual field once macular edema improves    Ophthalmic Meds Ordered this visit:  Meds ordered this encounter  Medications  . DISCONTD: prednisoLONE acetate (PRED FORTE) 1 % ophthalmic suspension    Sig: Place 1 drop into the right eye 4 (four) times daily for 7 days.    Dispense:  10 mL    Refill:  0  . prednisoLONE acetate (PRED FORTE) 1 % ophthalmic suspension    Sig: Place 1 drop into the right eye 4 (four) times daily for 7 days.    Dispense:  10 mL    Refill:  0  . triamcinolone acetonide (KENALOG-40) injection 4 mg       Return in about 1 week (around 08/01/2018) for f/u PDR OU, PRP OS.  There are no Patient Instructions on file for this visit.   Explained the diagnoses, plan, and follow up with the patient and they expressed understanding.  Patient expressed understanding of the importance of proper follow up care.   This document serves as a record of services personally  performed by Gardiner Sleeper, MD, PhD. It was created on their behalf by Ernest Mallick, OA, an ophthalmic assistant. The creation of this record is the provider's dictation and/or activities during the visit.    Electronically signed by: Ernest Mallick, OA  02.12.2020 3:51 PM    Gardiner Sleeper, M.D., Ph.D. Diseases & Surgery of the Retina and Vitreous [facility]  I have reviewed the above documentation for accuracy and completeness, and I agree with the above. Gardiner Sleeper, M.D., Ph.D. 07/28/18 4:09 PM     Abbreviations: M myopia (nearsighted); A astigmatism; H hyperopia (farsighted); P presbyopia; Mrx spectacle prescription;  CTL contact lenses; OD right eye; OS left eye; OU both eyes  XT exotropia; ET esotropia; PEK punctate epithelial keratitis; PEE punctate epithelial erosions; DES dry eye syndrome; MGD meibomian gland dysfunction; ATs artificial tears; PFAT's preservative free artificial tears; [place] nuclear sclerotic cataract; PSC posterior subcapsular cataract; ERM epi-retinal membrane; PVD posterior vitreous detachment; RD retinal detachment; DM diabetes mellitus; DR diabetic retinopathy; NPDR non-proliferative diabetic retinopathy; PDR proliferative diabetic retinopathy; CSME clinically significant macular edema; DME diabetic macular edema; dbh dot blot hemorrhages; CWS cotton wool spot; POAG primary open angle glaucoma; C/D cup-to-disc ratio; HVF humphrey visual field; GVF goldmann visual field; OCT optical coherence tomography; IOP intraocular pressure; BRVO Branch retinal vein occlusion; CRVO central retinal vein  occlusion; CRAO central retinal artery occlusion; BRAO branch retinal artery occlusion; RT retinal tear; SB scleral buckle; PPV pars plana vitrectomy; VH Vitreous hemorrhage; PRP panretinal laser photocoagulation; IVK intravitreal kenalog; VMT vitreomacular traction; MH Macular hole;  NVD neovascularization of the disc; NVE neovascularization elsewhere;  AREDS age related eye disease study; ARMD age related macular degeneration; POAG primary open angle glaucoma; EBMD epithelial/anterior basement membrane dystrophy; ACIOL anterior chamber intraocular lens; IOL intraocular lens; PCIOL posterior chamber intraocular lens; Phaco/IOL phacoemulsification with intraocular lens placement; [place] photorefractive keratectomy; LASIK laser assisted in situ keratomileusis; HTN hypertension; DM diabetes mellitus; COPD chronic obstructive pulmonary disease

## 2018-07-26 MED ORDER — TRIAMCINOLONE ACETONIDE 40 MG/ML IJ SUSP FOR KALEIDOSCOPE
4.0000 mg | INTRAMUSCULAR | Status: DC
  Administered 2018-07-26: 4 mg via INTRAVITREAL

## 2018-07-31 NOTE — Progress Notes (Signed)
[facility] Note  08/01/2018     CHIEF COMPLAINT Patient presents for Retina Follow Up   HISTORY OF PRESENT ILLNESS: Bill [name] is a 38 y.o. male who presents to the clinic today for:   HPI    Retina Follow Up    Patient presents with  Diabetic Retinopathy.  In both eyes.  This started 3 months ago.  Severity is moderate.  Duration of 1 week.  Since onset it is gradually worsening.  I, the attending physician,  performed the HPI with the patient and updated documentation appropriately.          Comments    38 y/o male pt here for 1 wk f/u for PDR w/mac edema OU.  Here for PRP OS.  No noticed change in [place] OU.  Denies pain, flashes, floaters.  PF QID OD.  BS 170 last night.  A1C unknown.       Last edited by [name], MD on 08/01/2018 11:52 AM. (History)    pt here for PRP OS  Referring physician: No referring provider defined for this encounter.  HISTORICAL INFORMATION:   Selected notes from the MEDICAL RECORD NUMBER Referred by Dr. [name] for concern of severe NPDR / PDR OU; LEE-  Ocular Hx-  PMH-     CURRENT MEDICATIONS: Current Outpatient Medications (Ophthalmic Drugs)  Medication Sig  . prednisoLONE acetate (PRED FORTE) 1 % ophthalmic suspension Place 1 drop into the right eye 4 (four) times daily for 7 days.   No current facility-administered medications for this visit.  (Ophthalmic Drugs)   Current Outpatient Medications (Other)  Medication Sig  . amLODipine (NORVASC) 10 MG tablet Take 1 tablet (10 mg total) by mouth daily.  Marland Kitchen aspirin 81 MG EC tablet Take 1 tablet (81 mg total) by mouth daily.  Marland Kitchen atorvastatin (LIPITOR) 80 MG tablet Take 1 tablet (80 mg total) by mouth daily at 6 PM.  . Empagliflozin-metFORMIN HCl (SYNJARDY) 5-500 MG TABS Take 1 tablet by mouth daily.  . hydrochlorothiazide (HYDRODIURIL) 25 MG tablet Take 1 tablet (25 mg total) by mouth daily.  Marland Kitchen ibuprofen (ADVIL,MOTRIN) 800 MG tablet TAKE 1 TABLET BY  MOUTH THREE TIMES A DAY FOR 10 DAYS  . telmisartan (MICARDIS) 80 MG tablet Take 80 mg by mouth daily.   Current Facility-Administered Medications (Other)  Medication Route  . triamcinolone acetonide (KENALOG-40) injection 4 mg Intravitreal  . triamcinolone acetonide (KENALOG-40) injection 4 mg Intravitreal      REVIEW OF SYSTEMS: ROS    Positive for: Endocrine, Eyes   Negative for: Constitutional, Gastrointestinal, Neurological, Skin, Genitourinary, Musculoskeletal, HENT, Cardiovascular, Respiratory, Psychiatric, Allergic/Imm, Heme/Lymph   Last edited by Matthew Folks, COA on 08/01/2018  8:50 AM. (History)       ALLERGIES No Known Allergies  PAST MEDICAL HISTORY Past Medical History:  Diagnosis Date  . Diabetes mellitus without complication ([place])   . Diabetic retinopathy ([place])    PDR OU  . Stroke [facility])    Past Surgical History:  Procedure Laterality Date  . arm surgery      FAMILY HISTORY Family History  Problem Relation Age of Onset  . Diabetes Mellitus II Mother   . Hypertension Mother   . Cataracts Mother   . Diabetes Mellitus II Father   . Stroke Father   . Hypertension Father   . Glaucoma Maternal Grandmother   . Amblyopia Neg Hx   . Blindness Neg Hx   . Macular degeneration Neg Hx   .  Retinal detachment Neg Hx   . Strabismus Neg Hx   . Retinitis pigmentosa Neg Hx     SOCIAL HISTORY Social History   Tobacco Use  . Smoking status: Never Smoker  . Smokeless tobacco: Never Used  Substance Use Topics  . Alcohol use: Yes    Comment: Occasionally.  . Drug use: Not on file         OPHTHALMIC EXAM:  Base Eye Exam    Visual Acuity (Snellen - Linear)      Right Left   Dist [place] CF @ 2' 20/100 -2   Dist ph [place] 20/300 - 20/30 -2       Tonometry (Tonopen, 8:53 AM)      Right Left   Pressure 16 12       Pupils      Dark Light Shape React APD   Right 4 3 Round Brisk None   Left 4 3 Round Brisk None       Visual Fields (Counting fingers)       Left Right    Full Full       Extraocular Movement      Right Left    Full, Ortho Full, Ortho       Neuro/Psych    Oriented x3:  Yes   Mood/Affect:  Normal       Dilation    Both eyes:  1.0% Mydriacyl, 2.5% Phenylephrine @ 8:53 AM        Slit Lamp and Fundus Exam    Slit Lamp Exam      Right Left   Lids/Lashes Normal Normal   Conjunctiva/Sclera White and quiet White and quiet   Cornea Clear Clear   Anterior Chamber Deep and quiet Deep and quiet   Iris Round and dilated, No NVI Round and dilated   Lens 1+ Nuclear sclerosis, 1+ Cortical cataract 1+ Nuclear sclerosis, 1+ Cortical cataract   Vitreous Mild VH / residual kenalog  kenalog settled inferiorly       Fundus Exam      Right Left   Disc Fine NVD, mild fibrosis Pink and Sharp, NVD   C/D Ratio 0.1 0.2   Macula Sub-hyaloid heme settling along inferior macula and IT arcades, vertical pre-retinal heme along ST arcades, scattered exudates and DBH Scattered exudates, IRH, +edema   Vessels Vascular attenuation +NVE along arcades   Periphery Attached, scattered DBH, good 360 PRP and posterior fill-in in place Attached; light PRP laser changes 360 degrees; room for posterior fill-in          IMAGING AND PROCEDURES  Imaging and Procedures for _0 @  OCT, Retina - OU - Both Eyes       Right Eye Quality was good. Central Foveal Thickness: 777. Progression has been stable. Findings include abnormal foveal contour, intraretinal fluid, intraretinal hyper-reflective material, subretinal fluid (Mild interval improvement in subhyaloid heme).   Left Eye Quality was good. Central Foveal Thickness: 238. Progression has improved. Findings include abnormal foveal contour, intraretinal fluid, intraretinal hyper-reflective material, no SRF (Interval improvement in IRF/IRHM).   Notes *Images captured and stored on drive  Diagnosis / Impression:  Severe DME OU OD: Mild interval improvement in subhyaloid heme OS: Interval  improvement in IRF/IRHM  Clinical management:  See below  Abbreviations: NFP - Normal foveal profile. CME - cystoid macular edema. PED - pigment epithelial detachment. IRF - intraretinal fluid. SRF - subretinal fluid. EZ - ellipsoid zone. ERM - epiretinal membrane. ORA - outer retinal atrophy. ORT -  outer retinal tubulation. SRHM - subretinal hyper-reflective material         Panretinal Photocoagulation - OS - Left Eye       LASER PROCEDURE NOTE  Diagnosis:   Proliferative Diabetic Retinopathy, LEFT EYE  Procedure:  Pan-retinal photocoagulation using slit lamp laser, LEFT EYE, fill in  Anesthesia:  Topical  Surgeon: [name], MD, PhD   Informed consent obtained, operative eye marked, and time out performed prior to initiation of laser.   Lumenis [id number] slit lamp laser Pattern: 3x3 square Power: 260 mW Duration: 30 msec  Spot size: 200 microns  # spots: 3570 spots  Complications: None.  RTC: 2-3 wks  Patient tolerated the procedure well and received written and verbal post-procedure care information/education.                  ASSESSMENT/PLAN:   1,2. Proliferative diabetic retinopathy OU - s/p PRP OD (10.02.19), fill-in (01.22.20), fill in OD (02.12.20) PRP OS (05/02/2018), - s/p IVK #1 OD (01.22.20), OS (02.12.20) - today, BCVA 20/300- from 20/40 OD -- persistent VH and preretinal hemorrhage; OS stable at 20/40  - exam shows persistent VH and preretinal heme OD, but improving DME OU, IRH, exudates and CWS OU - discussed the importance of compliance with medical therapy and follow ups - FA 8.19.19 shows leaking MA, patches of capillary nonperfusion OU - OCT shows interval improvement in DME OU -- s/p IVK  - recommend avoidance of anti-VEGF injections due to recent stroke - recommend laser PRP fill-in OS, 02.19.20 - RBA of procedure discussed, questions answered - informed consent obtained and signed - see procedure notes - start PF QID OS x7  days - f/u 3 weeks -- DFE OCT repeat FA - Optos transit OS  3-5. 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 [name] in mid August 2019 with a 2 wk history intractable headaches and decreased vision OU - found to have BP of 170s /90s -- sent to ED and was admitted to [facility] where SBP was up to 200 - discussed importance of tight BP control - pt reports improved compliance with BP medications and improvement in BP  6. Acute CVA secondary to uncontrolled HTN - MRI on 8.14.19: IMPRESSION: 1. Limited 2 sequence MRI head: Acute RIGHT periatrial subcentimeter nonhemorrhagic infarct, in region of optic tract. - could be limiting vision -- will check formal visual field once macular edema improves    Ophthalmic Meds Ordered this visit:  No orders of the defined types were placed in this encounter.      Return in about 3 weeks (around 08/22/2018) for f/u NPDR OU, DFE, OCT.  There are no Patient Instructions on file for this visit.   Explained the diagnoses, plan, and follow up with the patient and they expressed understanding.  Patient expressed understanding of the importance of proper follow up care.   This document serves as a record of services personally performed by Gardiner Sleeper, MD, PhD. It was created on their behalf by Ernest Mallick, OA, an ophthalmic assistant. The creation of this record is the provider's dictation and/or activities during the visit.    Electronically signed by: Ernest Mallick, OA  02.18.2020 12:00 PM    Gardiner Sleeper, M.D., Ph.D. Diseases & Surgery of the Retina and Vitreous [facility]  I have reviewed the above documentation for accuracy and completeness, and I agree with the above. Gardiner Sleeper, M.D., Ph.D. 08/01/18 12:00  PM      Abbreviations: M myopia (nearsighted); A astigmatism; H hyperopia (farsighted); P presbyopia; Mrx spectacle prescription;  CTL contact lenses; OD right  eye; OS left eye; OU both eyes  XT exotropia; ET esotropia; PEK punctate epithelial keratitis; PEE punctate epithelial erosions; DES dry eye syndrome; MGD meibomian gland dysfunction; ATs artificial tears; PFAT's preservative free artificial tears; [place] nuclear sclerotic cataract; PSC posterior subcapsular cataract; ERM epi-retinal membrane; PVD posterior vitreous detachment; RD retinal detachment; DM diabetes mellitus; DR diabetic retinopathy; NPDR non-proliferative diabetic retinopathy; PDR proliferative diabetic retinopathy; CSME clinically significant macular edema; DME diabetic macular edema; dbh dot blot hemorrhages; CWS cotton wool spot; POAG primary open angle glaucoma; C/D cup-to-disc ratio; HVF humphrey visual field; GVF goldmann visual field; OCT optical coherence tomography; IOP intraocular pressure; BRVO Branch retinal vein occlusion; CRVO central retinal vein occlusion; CRAO central retinal artery occlusion; BRAO branch retinal artery occlusion; RT retinal tear; SB scleral buckle; PPV pars plana vitrectomy; VH Vitreous hemorrhage; PRP panretinal laser photocoagulation; IVK intravitreal kenalog; VMT vitreomacular traction; MH Macular hole;  NVD neovascularization of the disc; NVE neovascularization elsewhere; AREDS age related eye disease study; ARMD age related macular degeneration; POAG primary open angle glaucoma; EBMD epithelial/anterior basement membrane dystrophy; ACIOL anterior chamber intraocular lens; IOL intraocular lens; PCIOL posterior chamber intraocular lens; Phaco/IOL phacoemulsification with intraocular lens placement; [place] photorefractive keratectomy; LASIK laser assisted in situ keratomileusis; HTN hypertension; DM diabetes mellitus; COPD chronic obstructive pulmonary disease

## 2018-08-01 ENCOUNTER — Encounter (INDEPENDENT_AMBULATORY_CARE_PROVIDER_SITE_OTHER): Payer: Self-pay | Admitting: Ophthalmology

## 2018-08-01 ENCOUNTER — Ambulatory Visit (INDEPENDENT_AMBULATORY_CARE_PROVIDER_SITE_OTHER): Payer: 59 | Admitting: Ophthalmology

## 2018-08-01 DIAGNOSIS — I639 Cerebral infarction, unspecified: Secondary | ICD-10-CM

## 2018-08-01 DIAGNOSIS — E113513 Type 2 diabetes mellitus with proliferative diabetic retinopathy with macular edema, bilateral: Secondary | ICD-10-CM | POA: Diagnosis not present

## 2018-08-01 DIAGNOSIS — H3581 Retinal edema: Secondary | ICD-10-CM | POA: Diagnosis not present

## 2018-08-01 DIAGNOSIS — Z91199 Patient's noncompliance with other medical treatment and regimen due to unspecified reason: Secondary | ICD-10-CM

## 2018-08-01 DIAGNOSIS — I10 Essential (primary) hypertension: Secondary | ICD-10-CM

## 2018-08-01 DIAGNOSIS — Z9119 Patient's noncompliance with other medical treatment and regimen: Secondary | ICD-10-CM

## 2018-08-01 DIAGNOSIS — H35033 Hypertensive retinopathy, bilateral: Secondary | ICD-10-CM

## 2018-08-22 ENCOUNTER — Other Ambulatory Visit: Payer: Self-pay

## 2018-08-22 ENCOUNTER — Encounter (INDEPENDENT_AMBULATORY_CARE_PROVIDER_SITE_OTHER): Payer: 59 | Admitting: Ophthalmology

## 2018-08-24 ENCOUNTER — Encounter (INDEPENDENT_AMBULATORY_CARE_PROVIDER_SITE_OTHER): Payer: 59 | Admitting: Ophthalmology

## 2018-10-06 ENCOUNTER — Encounter (HOSPITAL_COMMUNITY): Payer: Self-pay | Admitting: Emergency Medicine

## 2018-10-06 ENCOUNTER — Other Ambulatory Visit: Payer: Self-pay

## 2018-10-06 ENCOUNTER — Inpatient Hospital Stay (HOSPITAL_COMMUNITY)
Admission: EM | Admit: 2018-10-06 | Discharge: 2018-10-14 | DRG: 682 | Disposition: A | Discharge status: Home / Self Care | Payer: 59 | Source: Ambulatory Visit | Attending: Family Medicine | Admitting: Family Medicine

## 2018-10-06 ENCOUNTER — Emergency Department (HOSPITAL_COMMUNITY): Payer: 59

## 2018-10-06 DIAGNOSIS — N99840 Postprocedural hematoma of a genitourinary system organ or structure following a genitourinary system procedure: Secondary | ICD-10-CM | POA: Diagnosis not present

## 2018-10-06 DIAGNOSIS — Z823 Family history of stroke: Secondary | ICD-10-CM

## 2018-10-06 DIAGNOSIS — E1322 Other specified diabetes mellitus with diabetic chronic kidney disease: Secondary | ICD-10-CM | POA: Diagnosis present

## 2018-10-06 DIAGNOSIS — Z7982 Long term (current) use of aspirin: Secondary | ICD-10-CM

## 2018-10-06 DIAGNOSIS — E1159 Type 2 diabetes mellitus with other circulatory complications: Secondary | ICD-10-CM | POA: Diagnosis present

## 2018-10-06 DIAGNOSIS — Z832 Family history of diseases of the blood and blood-forming organs and certain disorders involving the immune mechanism: Secondary | ICD-10-CM

## 2018-10-06 DIAGNOSIS — Z9114 Patient's other noncompliance with medication regimen: Secondary | ICD-10-CM

## 2018-10-06 DIAGNOSIS — I5030 Unspecified diastolic (congestive) heart failure: Secondary | ICD-10-CM | POA: Diagnosis present

## 2018-10-06 DIAGNOSIS — S37019A Minor contusion of unspecified kidney, initial encounter: Secondary | ICD-10-CM | POA: Diagnosis not present

## 2018-10-06 DIAGNOSIS — N179 Acute kidney failure, unspecified: Secondary | ICD-10-CM | POA: Diagnosis not present

## 2018-10-06 DIAGNOSIS — E139 Other specified diabetes mellitus without complications: Secondary | ICD-10-CM | POA: Diagnosis present

## 2018-10-06 DIAGNOSIS — Z833 Family history of diabetes mellitus: Secondary | ICD-10-CM

## 2018-10-06 DIAGNOSIS — E133593 Other specified diabetes mellitus with proliferative diabetic retinopathy without macular edema, bilateral: Secondary | ICD-10-CM | POA: Diagnosis present

## 2018-10-06 DIAGNOSIS — E1351 Other specified diabetes mellitus with diabetic peripheral angiopathy without gangrene: Secondary | ICD-10-CM | POA: Diagnosis present

## 2018-10-06 DIAGNOSIS — D649 Anemia, unspecified: Secondary | ICD-10-CM | POA: Diagnosis present

## 2018-10-06 DIAGNOSIS — Z8673 Personal history of transient ischemic attack (TIA), and cerebral infarction without residual deficits: Secondary | ICD-10-CM

## 2018-10-06 DIAGNOSIS — I509 Heart failure, unspecified: Secondary | ICD-10-CM | POA: Diagnosis not present

## 2018-10-06 DIAGNOSIS — Z8249 Family history of ischemic heart disease and other diseases of the circulatory system: Secondary | ICD-10-CM

## 2018-10-06 DIAGNOSIS — Z20828 Contact with and (suspected) exposure to other viral communicable diseases: Secondary | ICD-10-CM | POA: Diagnosis present

## 2018-10-06 DIAGNOSIS — I5033 Acute on chronic diastolic (congestive) heart failure: Secondary | ICD-10-CM | POA: Diagnosis present

## 2018-10-06 DIAGNOSIS — N184 Chronic kidney disease, stage 4 (severe): Secondary | ICD-10-CM | POA: Diagnosis present

## 2018-10-06 DIAGNOSIS — I10 Essential (primary) hypertension: Secondary | ICD-10-CM | POA: Diagnosis present

## 2018-10-06 DIAGNOSIS — I130 Hypertensive heart and chronic kidney disease with heart failure and stage 1 through stage 4 chronic kidney disease, or unspecified chronic kidney disease: Secondary | ICD-10-CM | POA: Diagnosis present

## 2018-10-06 DIAGNOSIS — D62 Acute posthemorrhagic anemia: Secondary | ICD-10-CM | POA: Diagnosis not present

## 2018-10-06 DIAGNOSIS — D631 Anemia in chronic kidney disease: Secondary | ICD-10-CM | POA: Diagnosis present

## 2018-10-06 DIAGNOSIS — R319 Hematuria, unspecified: Secondary | ICD-10-CM | POA: Diagnosis not present

## 2018-10-06 DIAGNOSIS — Z79899 Other long term (current) drug therapy: Secondary | ICD-10-CM

## 2018-10-06 LAB — COMPREHENSIVE METABOLIC PANEL
ALT: 27 U/L (ref 0–44)
AST: 27 U/L (ref 15–41)
Albumin: 3 g/dL — ABNORMAL LOW (ref 3.5–5.0)
Alkaline Phosphatase: 59 U/L (ref 38–126)
Anion gap: 10 (ref 5–15)
BUN: 37 mg/dL — ABNORMAL HIGH (ref 6–20)
CO2: 18 mmol/L — ABNORMAL LOW (ref 22–32)
Calcium: 8.5 mg/dL — ABNORMAL LOW (ref 8.9–10.3)
Chloride: 111 mmol/L (ref 98–111)
Creatinine, Ser: 3.55 mg/dL — ABNORMAL HIGH (ref 0.61–1.24)
GFR calc Af Amer: 24 mL/min — ABNORMAL LOW (ref >60)
GFR calc non Af Amer: 20 mL/min — ABNORMAL LOW (ref >60)
Glucose, Bld: 136 mg/dL — ABNORMAL HIGH (ref 70–99)
Potassium: 4.6 mmol/L (ref 3.5–5.1)
Sodium: 139 mmol/L (ref 135–145)
Total Bilirubin: 0.5 mg/dL (ref 0.3–1.2)
Total Protein: 7.3 g/dL (ref 6.5–8.1)

## 2018-10-06 LAB — TROPONIN I: Troponin I: <0.03 ng/mL (ref <0.03)

## 2018-10-06 LAB — ABO/RH: ABO/RH(D): A POS

## 2018-10-06 LAB — CBC WITH DIFFERENTIAL/PLATELET
Abs Immature Granulocytes: 0.03 10*3/uL (ref 0.00–0.07)
Basophils Absolute: 0 10*3/uL (ref 0.0–0.1)
Basophils Relative: 0 %
Eosinophils Absolute: 0.1 10*3/uL (ref 0.0–0.5)
Eosinophils Relative: 2 %
HCT: 20.5 % — ABNORMAL LOW (ref 39.0–52.0)
Hemoglobin: 6.2 g/dL — CL (ref 13.0–17.0)
Immature Granulocytes: 1 %
Lymphocytes Relative: 15 %
Lymphs Abs: 1 10*3/uL (ref 0.7–4.0)
MCH: 24.3 pg — ABNORMAL LOW (ref 26.0–34.0)
MCHC: 30.2 g/dL (ref 30.0–36.0)
MCV: 80.4 fL (ref 80.0–100.0)
Monocytes Absolute: 0.4 10*3/uL (ref 0.1–1.0)
Monocytes Relative: 6 %
Neutro Abs: 4.8 10*3/uL (ref 1.7–7.7)
Neutrophils Relative %: 76 %
Platelets: 240 10*3/uL (ref 150–400)
RBC: 2.55 MIL/uL — ABNORMAL LOW (ref 4.22–5.81)
RDW: 17.3 % — ABNORMAL HIGH (ref 11.5–15.5)
WBC: 6.3 10*3/uL (ref 4.0–10.5)
nRBC: 0 % (ref 0.0–0.2)

## 2018-10-06 LAB — POC OCCULT BLOOD, ED: Fecal Occult Bld: NEGATIVE

## 2018-10-06 LAB — PREPARE RBC (CROSSMATCH)

## 2018-10-06 LAB — GLUCOSE, CAPILLARY: Glucose-Capillary: 97 mg/dL (ref 70–99)

## 2018-10-06 LAB — BRAIN NATRIURETIC PEPTIDE: B Natriuretic Peptide: 317.3 pg/mL — ABNORMAL HIGH (ref 0.0–100.0)

## 2018-10-06 MED ORDER — INSULIN ASPART 100 UNIT/ML ~~LOC~~ SOLN
0.0000 [IU] | Freq: Three times a day (TID) | SUBCUTANEOUS | Status: DC
  Administered 2018-10-07: 2 [IU] via SUBCUTANEOUS
  Administered 2018-10-07 – 2018-10-09 (×3): 1 [IU] via SUBCUTANEOUS
  Administered 2018-10-10 – 2018-10-13 (×3): 2 [IU] via SUBCUTANEOUS

## 2018-10-06 MED ORDER — INSULIN ASPART 100 UNIT/ML ~~LOC~~ SOLN: 0.0000 [IU] | Freq: Every day | SUBCUTANEOUS | Status: DC

## 2018-10-06 MED ORDER — FUROSEMIDE 10 MG/ML IJ SOLN
40.0000 mg | Freq: Once | INTRAMUSCULAR | Status: AC
  Administered 2018-10-06: 40 mg via INTRAVENOUS
  Filled 2018-10-06: qty 4

## 2018-10-06 MED ORDER — ATORVASTATIN CALCIUM 80 MG PO TABS
80.0000 mg | ORAL_TABLET | Freq: Every day | ORAL | Status: DC
  Administered 2018-10-07 – 2018-10-13 (×7): 80 mg via ORAL
  Filled 2018-10-06 (×7): qty 1

## 2018-10-06 MED ORDER — FUROSEMIDE 10 MG/ML IJ SOLN: 40.0000 mg | Freq: Every day | INTRAMUSCULAR | Status: DC

## 2018-10-06 MED ORDER — SODIUM CHLORIDE 0.9 % IV SOLN
10.0000 mL/h | Freq: Once | INTRAVENOUS | Status: AC
  Administered 2018-10-06: 10 mL/h via INTRAVENOUS

## 2018-10-06 MED ORDER — ASPIRIN EC 81 MG PO TBEC
81.0000 mg | DELAYED_RELEASE_TABLET | Freq: Every day | ORAL | Status: DC
  Administered 2018-10-07: 81 mg via ORAL
  Filled 2018-10-06: qty 1

## 2018-10-06 NOTE — ED Triage Notes (Signed)
Pt sent here from urgent care. Pt was complaining of leg swelling and SOB. Pt had xray completed. Pt states urgent care said he could have possible mild pneumonia and congestion.

## 2018-10-06 NOTE — ED Provider Notes (Signed)
[facility] EMERGENCY DEPARTMENT Provider Note   CSN: [id number] Arrival date & time: 10/06/18  1226    History   Chief Complaint No chief complaint on file.   HPI Bill [name] is a 38 y.o. male.     HPI  He presents for evaluation of swelling of both legs, present for 4 days, with orthopnea.  He denies prior similar problem.  He denies cardiac failure, but states he has a cardiac murmur.  He is status post stroke which caused some transient vision loss, but that has improved.  He denies fever, chills, nausea, vomiting, weakness or dizziness.  He denies dyspnea upon exertion.  He has a cough which is occasionally productive of sputum.  He denies known sick contacts.  He went to an urgent care, prior to coming here, and was evaluated with a chest x-ray which showed bilateral pulmonary edema and possible multifocal pneumonia.  A Covid-19 test was sent and is pending, it will result in 48 hours.  He denies known sick contacts at home or work.  He works as a Education officer, museum.  He has contact with multiple people during the course of his work activities.  He has not been social distancing, or using a mask, at work.  There are no other known modifying factors.   Past Medical History:  Diagnosis Date  . Diabetes mellitus without complication ([place])   . Diabetic retinopathy ([place])    PDR OU  . Stroke [facility])     Patient Active Problem List   Diagnosis Date Noted  . Acute CVA (cerebrovascular accident) ([place]) 01/25/2018  . Hypertensive emergency 01/25/2018  . Acute non intractable tension-type headache 01/25/2018  . 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 arm surgery          Home Medications    Prior to Admission medications   Medication Sig Start Date End Date Taking? Authorizing Provider  amLODipine (NORVASC) 10 MG tablet Take 1 tablet (10 mg total) by mouth daily. 01/26/18 01/26/19   Nita Sells, MD  aspirin 81 MG EC tablet Take 1 tablet (81 mg total) by mouth daily. 01/26/18   Nita Sells, MD  atorvastatin (LIPITOR) 80 MG tablet Take 1 tablet (80 mg total) by mouth daily at 6 PM. 01/26/18   Nita Sells, MD  hydrochlorothiazide (HYDRODIURIL) 25 MG tablet Take 1 tablet (25 mg total) by mouth daily. 01/24/18 02/23/18  Long, Wonda Olds, MD    Family History Family History  Problem Relation Age of Onset  . Diabetes Mellitus II Mother   . Hypertension Mother   . Cataracts Mother   . Diabetes Mellitus II Father   . Stroke Father   . Hypertension Father   . Glaucoma Maternal Grandmother   . Amblyopia Neg Hx   . Blindness Neg Hx   . Macular degeneration Neg Hx   . Retinal detachment Neg Hx   . Strabismus Neg Hx   . Retinitis pigmentosa Neg Hx     Social History Social History   Tobacco Use  . Smoking status: Never Smoker  . Smokeless tobacco: Never Used  Substance Use Topics  . Alcohol use: Yes    Comment: Occasionally.  . Drug use: Not on file     Allergies   Patient has no known allergies.   Review of Systems Review of Systems  All other systems reviewed and are negative.    Physical Exam  Updated Vital Signs BP (!) 159/88 (BP Location: Right Arm)   Pulse 91   Temp 98.7 F (37.1 C) (Oral)   Resp 18   Ht 5\' 11"  (1.803 m)   Wt 89.4 kg   SpO2 96%   BMI 27.48 kg/m   Physical Exam Vitals signs and nursing note reviewed.  Constitutional:      General: He is not in acute distress.    Appearance: He is well-developed. He is not ill-appearing or diaphoretic.  HENT:     Head: Normocephalic and atraumatic.     Right Ear: External ear normal.     Left Ear: External ear normal.     Mouth/Throat:     Mouth: Mucous membranes are moist.  Eyes:     Conjunctiva/sclera: Conjunctivae normal.     Pupils: Pupils are equal, round, and reactive to light.  Neck:     Musculoskeletal: Normal range of motion and neck supple.      Trachea: Phonation normal.  Cardiovascular:     Rate and Rhythm: Normal rate and regular rhythm.     Heart sounds: Murmur present. No friction rub. No gallop.      Comments: Systolic murmur, base of heart.  No S3.  No JVD, at 45 degrees. Pulmonary:     Effort: Pulmonary effort is normal. No respiratory distress.     Breath sounds: Normal breath sounds. No stridor. No rhonchi.  Chest:     Chest wall: No tenderness.  Abdominal:     General: There is no distension.     Palpations: Abdomen is soft.     Tenderness: There is no abdominal tenderness.  Genitourinary:    Comments: Normal anus. Brown stool in rectal vault, without mass. Musculoskeletal: Normal range of motion.     Right lower leg: Edema present.     Left lower leg: Edema present.     Comments: 3+ bilateral lower leg edema  Skin:    General: Skin is warm and dry.  Neurological:     Mental Status: He is alert and oriented to person, place, and time.     Cranial Nerves: No cranial nerve deficit.     Sensory: No sensory deficit.     Motor: No abnormal muscle tone.     Coordination: Coordination normal.  Psychiatric:        Mood and Affect: Mood normal.        Behavior: Behavior normal.        Thought Content: Thought content normal.        Judgment: Judgment normal.      ED Treatments / Results  Labs (all labs ordered are listed, but only abnormal results are displayed) Labs Reviewed  COMPREHENSIVE METABOLIC PANEL - Abnormal; Notable for the following components:      Result Value   CO2 18 (*)    Glucose, Bld 136 (*)    BUN 37 (*)    Creatinine, Ser 3.55 (*)    Calcium 8.5 (*)    Albumin 3.0 (*)    GFR calc non Af Amer 20 (*)    GFR calc Af Amer 24 (*)    All other components within normal limits  CBC WITH DIFFERENTIAL/PLATELET - Abnormal; Notable for the following components:   RBC 2.55 (*)    Hemoglobin 6.2 (*)    HCT 20.5 (*)    MCH 24.3 (*)    RDW 17.3 (*)    All other components within normal limits   BRAIN NATRIURETIC PEPTIDE -  Abnormal; Notable for the following components:   B Natriuretic Peptide 317.3 (*)    All other components within normal limits  TROPONIN I  POC OCCULT BLOOD, ED  PREPARE RBC (CROSSMATCH)  TYPE AND SCREEN    EKG EKG Interpretation  Date/Time:  Saturday October 06 2018 13:38:53 EDT Ventricular Rate:  97 PR Interval:    QRS Duration: 95 QT Interval:  361 QTC Calculation: 459 R Axis:   78 Text Interpretation:  Sinus rhythm Low voltage, precordial leads since last tracing no significant change Confirmed by Daleen Bo [phone]) on 10/06/2018 2:32:47 PM   Radiology No results found.  Procedures .Critical Care Performed by: Daleen Bo, MD Authorized by: Daleen Bo, MD   Critical care provider statement:    Critical care time (minutes):  35   Critical care start time:  10/06/2018 12:50 PM   Critical care end time:  10/06/2018 4:10 PM   Critical care time was exclusive of:  Separately billable procedures and treating other patients   Critical care was necessary to treat or prevent imminent or life-threatening deterioration of the following conditions:  Cardiac failure   Critical care was time spent personally by me on the following activities:  Blood draw for specimens, development of treatment plan with patient or surrogate, discussions with consultants, evaluation of patient's response to treatment, examination of patient, obtaining history from patient or surrogate, ordering and performing treatments and interventions, ordering and review of laboratory studies, pulse oximetry, re-evaluation of patient's condition, review of old charts and ordering and review of radiographic studies   (including critical care time)  Medications Ordered in ED Medications  0.9 %  sodium chloride infusion (has no administration in time range)  furosemide (LASIX) injection 40 mg (has no administration in time range)     Initial Impression / Assessment and Plan / ED  Course  I have reviewed the triage vital signs and the nursing notes.  Pertinent labs & imaging results that were available during my care of the patient were reviewed by me and considered in my medical decision making (see chart for details).  Clinical Course as of Oct 06 1607  Sat Oct 06, 2018  1530 normal  Troponin I - Once [EW]  1530 Normal except hemoglobin low  CBC with Differential(!!) [EW]  1530 Mild elevation  Brain natriuretic peptide(!) [EW]  1531 Normal except CO2 low, glucose high, BUN high, creatinine high, calcium low,  Comprehensive metabolic panel(!) [EW]  7989 Cardiomegaly with interstitial infiltrates, image reviewed by me  DG Chest Port 1 View [EW]    Clinical Course User Index [EW] Daleen Bo, MD       Patient Vitals for the past 24 hrs:  BP Temp Temp src Pulse Resp SpO2 Height Weight  10/06/18 1400 - - - 91 18 96 % - -  10/06/18 1235 (!) 159/88 98.7 F (37.1 C) Oral 93 16 99 % - -  10/06/18 1234 - - - - - - 5\' 11"  (1.803 m) 89.4 kg    4:02 PM Reevaluation with update and discussion. After initial assessment and treatment, an updated evaluation reveals no change in clinical status, findings discussed with the patient and all questions were answered. Daleen Bo   Medical Decision Making: Peripheral edema with pulmonary edema, and new onset congestive heart failure.  Hemodynamically stable.  Normal oxygenation on room air.   Patient is being treated for blood pressure with amlodipine and myocarditis , and apparently does not follow-up with primary care regularly.  Is likely that this is a subacute ongoing problem, that led to anemia.  There is no clear source for blood loss.  Anemia is likely contributing to the overall, process.  Blood transfusion ordered for symptomatic anemia leading to congestive heart failure.  Single unit ordered, will have to be monitored closely for exacerbation of CHF.  Patient will require hospitalization for further evaluation  including cardiac echo, monitoring, and medication adjustment.  Patient arrived with concern for COVID after evaluated at an urgent care.  Patient does not have fever, or other typical signs for Covid-19 infection.  He is considered very low risk to have Covid-19 infection at this time.  CRITICAL CARE-yes Performed by: Daleen Bo   Nursing Notes Reviewed/ Care Coordinated Applicable Imaging Reviewed Interpretation of Laboratory Data incorporated into ED treatment   4:11 PM-Consult complete with hospitalist. Patient case explained and discussed.  She agrees to admit patient for further evaluation and treatment. Call ended at 4:23 PM  Plan: Admit    Final Clinical Impressions(s) / ED Diagnoses   Final diagnoses:  Acute kidney injury ([place])  Congestive heart failure, unspecified HF chronicity, unspecified heart failure type ([place])  Anemia, unspecified type    ED Discharge Orders    None       Daleen Bo, MD 10/06/18 1627

## 2018-10-06 NOTE — ED Notes (Signed)
ED TO INPATIENT HANDOFF REPORT  ED Nurse Name and Phone #: Sherrine Maples [phone]  S Name/Age/Gender Bill [name] 38 y.o. male Room/Bed: 011C/011C  Code Status   Code Status: Prior  Home/SNF/Other Home Patient oriented to: self, place, time and situation Is this baseline? Yes   Triage Complete: Triage complete  Chief Complaint poss covid ruleout   Triage Note Pt sent here from urgent care. Pt was complaining of leg swelling and SOB. Pt had xray completed. Pt states urgent care said he could have possible mild pneumonia and congestion.    Allergies No Known Allergies  Level of Care/Admitting Diagnosis ED Disposition    ED Disposition Condition [facility] Area: [place] [[id number]]  Level of Care: Telemetry Medical [104]  I expect the patient will be discharged within 24 hours: No (not a candidate for 5C-Observation unit)  Covid Evaluation: Person Under Investigation (PUI)  Isolation Risk Level: Low Risk  (Less than 4L [place] supplementation)  Diagnosis: Anemia [941740]  Admitting Physician: Hosie Poisson [[id number]]  Attending Physician: Hosie Poisson [[id number]]  PT Class (Do Not Modify): Observation [104]  PT Acc Code (Do Not Modify): Observation [10022]       B Medical/Surgery History Past Medical History:  Diagnosis Date  . Diabetes mellitus without complication ([place])   . Diabetic retinopathy ([place])    PDR OU  . Stroke [facility])    Past Surgical History:  Procedure Laterality Date  . arm surgery       A IV Location/Drains/Wounds Patient Lines/Drains/Airways Status   Active Line/Drains/Airways    Name:   Placement date:   Placement time:   Site:   Days:   Peripheral IV 10/06/18 Left Antecubital   10/06/18    1626    Antecubital   less than 1          Intake/Output Last 24 hours No intake or output data in the 24 hours ending 10/06/18 1716  Labs/Imaging Results for orders placed or performed during the hospital encounter of  10/06/18 (from the past 48 hour(s))  Comprehensive metabolic panel     Status: Abnormal   Collection Time: 10/06/18  1:58 PM  Result Value Ref Range   Sodium 139 135 - 145 mmol/L   Potassium 4.6 3.5 - 5.1 mmol/L   Chloride 111 98 - 111 mmol/L   CO2 18 (L) 22 - 32 mmol/L   Glucose, Bld 136 (H) 70 - 99 mg/dL   BUN 37 (H) 6 - 20 mg/dL   Creatinine, Ser 3.55 (H) 0.61 - 1.24 mg/dL   Calcium 8.5 (L) 8.9 - 10.3 mg/dL   Total Protein 7.3 6.5 - 8.1 g/dL   Albumin 3.0 (L) 3.5 - 5.0 g/dL   AST 27 15 - 41 U/L   ALT 27 0 - 44 U/L   Alkaline Phosphatase 59 38 - 126 U/L   Total Bilirubin 0.5 0.3 - 1.2 mg/dL   GFR calc non Af Amer 20 (L) >60 mL/min   GFR calc Af Amer 24 (L) >60 mL/min   Anion gap 10 5 - 15    Comment: Performed at [facility], [address], [place], [place] [address]  CBC with Differential     Status: Abnormal   Collection Time: 10/06/18  1:58 PM  Result Value Ref Range   WBC 6.3 4.0 - 10.5 K/uL   RBC 2.55 (L) 4.22 - 5.81 MIL/uL   Hemoglobin 6.2 (LL) 13.0 - 17.0 g/dL  Comment: REPEATED TO VERIFY THIS CRITICAL RESULT HAS VERIFIED AND BEEN CALLED TO M.Istvan Behar,RN BY JESSICA WEBBER ON 04 25 2020 AT 1442, AND HAS BEEN READ BACK.     HCT 20.5 (L) 39.0 - 52.0 %   MCV 80.4 80.0 - 100.0 fL   MCH 24.3 (L) 26.0 - 34.0 pg   MCHC 30.2 30.0 - 36.0 g/dL   RDW 17.3 (H) 11.5 - 15.5 %   Platelets 240 150 - 400 K/uL   nRBC 0.0 0.0 - 0.2 %   Neutrophils Relative % 76 %   Neutro Abs 4.8 1.7 - 7.7 K/uL   Lymphocytes Relative 15 %   Lymphs Abs 1.0 0.7 - 4.0 K/uL   Monocytes Relative 6 %   Monocytes Absolute 0.4 0.1 - 1.0 K/uL   Eosinophils Relative 2 %   Eosinophils Absolute 0.1 0.0 - 0.5 K/uL   Basophils Relative 0 %   Basophils Absolute 0.0 0.0 - 0.1 K/uL   Immature Granulocytes 1 %   Abs Immature Granulocytes 0.03 0.00 - 0.07 K/uL    Comment: Performed at [facility] [address], [place], [place] [address]  Troponin I - Once     Status: None   Collection Time:  10/06/18  1:58 PM  Result Value Ref Range   Troponin I <0.03 <0.03 ng/mL    Comment: Performed at [facility] [address] [place], [place] [address]  Brain natriuretic peptide     Status: Abnormal   Collection Time: 10/06/18  1:58 PM  Result Value Ref Range   B Natriuretic Peptide 317.3 (H) 0.0 - 100.0 pg/mL    Comment: Performed at [facility] [address], [place], [place] [address]  POC occult blood, ED Provider will collect     Status: None   Collection Time: 10/06/18  3:50 PM  Result Value Ref Range   Fecal Occult Bld NEGATIVE NEGATIVE  Type and screen [place]     Status: None (Preliminary result)   Collection Time: 10/06/18  4:23 PM  Result Value Ref Range   ABO/RH(D) A POS    Antibody Screen      NEG Performed at [facility] [address], [place], [place] [address]    Sample Expiration PENDING    Dg Chest Port 1 View  Result Date: 10/06/2018 CLINICAL DATA:  Leg swelling and short of breath EXAM: PORTABLE CHEST 1 VIEW COMPARISON:  09/01/2005 FINDINGS: Normal heart size. Lungs are under aerated with bibasilar atelectasis. No pneumothorax or pleural effusion. IMPRESSION: Normal heart size. Lungs clear. No pneumothorax. No pleural effusion. Electronically Signed   By: Marybelle Killings M.D.   On: 10/06/2018 16:19    Pending Labs Unresulted Labs (From admission, onward)    Start     Ordered   10/06/18 1656  Vitamin B12  (Anemia Panel (PNL))  Add-on,   R     10/06/18 1655   10/06/18 1656  Folate  (Anemia Panel (PNL))  Add-on,   R     10/06/18 1655   10/06/18 1656  Iron and TIBC  (Anemia Panel (PNL))  Add-on,   R     10/06/18 1655   10/06/18 1656  Ferritin  (Anemia Panel (PNL))  Add-on,   R     10/06/18 1655   10/06/18 1656  Reticulocytes  (Anemia Panel (PNL))  Add-on,   R     10/06/18 1655   10/06/18 1604  Prepare RBC  (  Adult Blood Administration - PRBC)  Once,   R    Question Answer Comment  # of Units 2 units   Transfusion  Indications Symptomatic Anemia   If emergent release call blood bank Not emergent release      10/06/18 1604          Vitals/Pain Today's Vitals   10/06/18 1234 10/06/18 1235 10/06/18 1400  BP:  (!) 159/88   Pulse:  93 91  Resp:  16 18  Temp:  98.7 F (37.1 C)   TempSrc:  Oral   SpO2:  99% 96%  Weight: 89.4 kg    Height: 5\' 11"  (1.803 m)    PainSc: 0-No pain      Isolation Precautions Droplet and Contact precautions  Medications Medications  0.9 %  sodium chloride infusion (has no administration in time range)  furosemide (LASIX) injection 40 mg (40 mg Intravenous Given 10/06/18 1619)    Mobility walks Low fall risk   Focused Assessments    R Recommendations: See Admitting Provider Note  Report given to:   Additional Notes:

## 2018-10-06 NOTE — H&P (Signed)
History and Physical    Bill [name] WJX:[id number] DOB: 02/17/1981 DOA: 10/06/2018  PCP: Patient, No Pcp Per  Patient coming from: Home.   I have personally briefly reviewed patient's old medical records in [place]  Chief Complaint: sob since last night.   HPI: Bill [name] is a 38 y.o. male with medical history significant of nonhemorrhagic stroke, type 2 diabetes mellitus without complication, hypertension initially presented to urgent care with the shortness of breath started last night.  Patient denies any fevers or chills but has occasional dry cough since 2 days.  Patient denies any nausea, vomiting, abdominal pain, diarrhea, hematemesis or hematochezia, melena.  Patient denies any syncope, headache, dizziness.  He reports worsening lower extremity swelling since Wednesday.  He also reports some orthopnea no PND.  He denies any chest pain.  He reports non compliance to medications.  He denies any dysuria. At the urgent care center he was swabbed for COVID-19 and sent out.  The results are pending. ED Course: On arrival to ED he was afebrile heart rate of 90/min respiratory rate of 18/min and oxygen sats 96-99 on room air blood pressure 159/88.  Stool for occult blood is negative.  Labs reveal BNP of 317.3, troponin of 0.03, hemoglobin of 6.2 hematocrit of 20.5, creatinine of 3.55 and BUN of 37 albumin of 3. His chest x-ray is clear without  acute cardiopulmonary disease  Review of Systems: As per HPI otherwise 10 point review of systems negative.    Past Medical History:  Diagnosis Date  . Diabetes mellitus without complication ([place])   . Diabetic retinopathy ([place])    PDR OU  . Stroke [facility])     Past Surgical History:  Procedure Laterality Date  . arm surgery       reports that he has never smoked. He has never used smokeless tobacco. He reports current alcohol use. No history on file for drug.  No Known Allergies  Family History  Problem Relation Age of  Onset  . Diabetes Mellitus II Mother   . Hypertension Mother   . Cataracts Mother   . Diabetes Mellitus II Father   . Stroke Father   . Hypertension Father   . Glaucoma Maternal Grandmother   . Amblyopia Neg Hx   . Blindness Neg Hx   . Macular degeneration Neg Hx   . Retinal detachment Neg Hx   . Strabismus Neg Hx   . Retinitis pigmentosa Neg Hx    Family history reviewed and pertinent  Prior to Admission medications   Medication Sig Start Date End Date Taking? Authorizing Provider  amLODipine (NORVASC) 10 MG tablet Take 1 tablet (10 mg total) by mouth daily. 01/26/18 01/26/19 Yes Nita Sells, MD  atorvastatin (LIPITOR) 80 MG tablet Take 1 tablet (80 mg total) by mouth daily at 6 PM. Patient taking differently: Take 80 mg by mouth daily.  01/26/18  Yes Nita Sells, MD  Ferrous Sulfate (IRON PO) Take 1 tablet by mouth daily.   Yes [provider]  losartan-hydrochlorothiazide (HYZAAR) 50-12.5 MG tablet Take 1 tablet by mouth daily. 07/17/18  Yes [provider]  metFORMIN (GLUCOPHAGE) 500 MG tablet Take 500 mg by mouth 2 (two) times daily with a meal.   Yes [provider]  aspirin 81 MG EC tablet Take 1 tablet (81 mg total) by mouth daily. Patient not taking: Reported on 10/06/2018 01/26/18   Nita Sells, MD  hydrochlorothiazide (HYDRODIURIL) 25 MG tablet Take 1 tablet (25 mg total) by mouth  daily. Patient not taking: Reported on 10/06/2018 01/24/18 02/23/18  Margette Fast, MD    Physical Exam: Vitals:   10/06/18 1234 10/06/18 1235 10/06/18 1400  BP:  (!) 159/88   Pulse:  93 91  Resp:  16 18  Temp:  98.7 F (37.1 C)   TempSrc:  Oral   SpO2:  99% 96%  Weight: 89.4 kg    Height: 5\' 11"  (1.803 m)      Constitutional: NAD, calm, comfortable Vitals:   10/06/18 1234 10/06/18 1235 10/06/18 1400  BP:  (!) 159/88   Pulse:  93 91  Resp:  16 18  Temp:  98.7 F (37.1 C)   TempSrc:  Oral   SpO2:  99% 96%  Weight: 89.4 kg     Height: 5\' 11"  (1.803 m)     Eyes: PERRL, lids and conjunctivae normal ENMT: Mucous membranes are moist. Posterior pharynx clear of any exudate or lesions.Normal dentition.  Neck: normal, supple, no masses, no thyromegaly Respiratory:diminished at bases, with scattered rales.  Cardiovascular: Regular rate and rhythm, no murmurs / rubs / gallops. No extremity edema. 2+ pedal pulses. No carotid bruits.  Abdomen: no tenderness, no masses palpated. No hepatosplenomegaly. Bowel sounds positive.  Musculoskeletal:3+ leg edema with some tenderness. .  Skin: no rashes, lesions, ulcers. No induration Neurologic: CN 2-12 grossly intact. Sensation intact, DTR normal. Strength 5/5 in all 4.  Psychiatric: Normal judgment and insight. Alert and oriented x 3. Normal mood.     Labs on Admission: I have personally reviewed following labs and imaging studies  CBC: Recent Labs  Lab 10/06/18 1358  WBC 6.3  NEUTROABS 4.8  HGB 6.2*  HCT 20.5*  MCV 80.4  PLT 412   Basic Metabolic Panel: Recent Labs  Lab 10/06/18 1358  NA 139  K 4.6  CL 111  CO2 18*  GLUCOSE 136*  BUN 37*  CREATININE 3.55*  CALCIUM 8.5*   GFR: Estimated Creatinine Clearance: 29.8 mL/min (A) (by C-G formula based on SCr of 3.55 mg/dL (H)). Liver Function Tests: Recent Labs  Lab 10/06/18 1358  AST 27  ALT 27  ALKPHOS 59  BILITOT 0.5  PROT 7.3  ALBUMIN 3.0*   No results for input(s): LIPASE, AMYLASE in the last 168 hours. No results for input(s): AMMONIA in the last 168 hours. Coagulation Profile: No results for input(s): INR, PROTIME in the last 168 hours. Cardiac Enzymes: Recent Labs  Lab 10/06/18 1358  TROPONINI <0.03   BNP (last 3 results) No results for input(s): PROBNP in the last 8760 hours. HbA1C: No results for input(s): HGBA1C in the last 72 hours. CBG: No results for input(s): GLUCAP in the last 168 hours. Lipid Profile: No results for input(s): CHOL, HDL, LDLCALC, TRIG, CHOLHDL, LDLDIRECT in  the last 72 hours. Thyroid Function Tests: No results for input(s): TSH, T4TOTAL, FREET4, T3FREE, THYROIDAB in the last 72 hours. Anemia Panel: No results for input(s): VITAMINB12, FOLATE, FERRITIN, TIBC, IRON, RETICCTPCT in the last 72 hours. Urine analysis:    Component Value Date/Time   COLORURINE YELLOW (A) 01/24/2018 1813   APPEARANCEUR CLEAR (A) 01/24/2018 1813   LABSPEC >1.030 (H) 01/24/2018 1813   PHURINE 5.5 01/24/2018 1813   GLUCOSEU NEGATIVE 01/24/2018 1813   HGBUR LARGE (A) 01/24/2018 1813   BILIRUBINUR NEGATIVE 01/24/2018 1813   KETONESUR NEGATIVE 01/24/2018 1813   PROTEINUR 100 (A) 01/24/2018 1813   NITRITE NEGATIVE 01/24/2018 1813   LEUKOCYTESUR NEGATIVE 01/24/2018 1813    Radiological Exams on Admission: Dg  Chest Port 1 View  Result Date: 10/06/2018 CLINICAL DATA:  Leg swelling and short of breath EXAM: PORTABLE CHEST 1 VIEW COMPARISON:  09/01/2005 FINDINGS: Normal heart size. Lungs are under aerated with bibasilar atelectasis. No pneumothorax or pleural effusion. IMPRESSION: Normal heart size. Lungs clear. No pneumothorax. No pleural effusion. Electronically Signed   By: Marybelle Killings M.D.   On: 10/06/2018 16:19    EKG: Independently reviewed. Sinus rhythm, 91/min.   Assessment/Plan Active Problems:   Anemia   Shortness of breath, with dry cough, with worsening pedal edema with elevated BNP, with prior h/o chronic diastolic heart failure without any fever or chills. Clinically pt appears fluid overloaded.  Will admit for acute on chronic diastolic heart failure. Patient is a low risk  COVID 19.  Swab was sent from [facility] and results will not be back till Monday.  Contact and droplet precautions.  Admit to telemetry.  Start the patient on IV lasix 40 mg daily.  Strict intake and output. Daily weights.  Monitor renal parameters while on lasix.  Get echocardiogram.    Anemia of chronic disease:   Baseline hemoglobin of 10, dropped to 6.2, . Pt denies  blood per rectum or malena.  He reports family  History of anemia and did not have work up with EGD or colonoscopy in the past.  Stool for occult blood is negative.  Anemia panel ordered and 2 units of prbc transfusion ordered by EDP.  Will call GI consult in am.  Pt denies using NSAIDS .     AKI:  Baseline creatinine around 1.1 in 01/2018.  Creatinine is 3.35 today.  Suspect from fluid overload vs diabetic nephropathy vs from uncontrolled hypertension.  Get UA, US renal and urine electrolytes to check for fENA and evaluate for obstruction. IV Diuresis and if no improvement in the next 24 hours, will get nephrology in put .     H/o non hemorrhagic stroke without any residual effects.  - pt was to be on aspirin, which he is not taking .  - restart aspirin 81 mg daily, resume statin and get lipid level in am.    Type 2 DM:  CBG (last 3)  No results for input(s): GLUCAP in the last 72 hours.  Start the patient on SSI.  Get hgba1c level. Hold metformin while hospitalized.     Hypertension:  Sub optimally controlled. Hold ARB'S AND hydrochlorothiazide.  Start patient on hydralazine 10 MG TID.     DVT prophylaxis:SCD'S after DVT is ruled out.  Code Status: full code.  Family Communication: none at bedside, discussed the plan with the patient.  Disposition Plan: pending further evaluation.  Consults called: none  Admission status: obs/ telemetry.    Hosie Poisson MD [facility] Pager [phone]   If 7PM-7AM, please contact night-coverage [email or web address] Password [email or web address]  10/06/2018, 5:20 PM

## 2018-10-06 NOTE — ED Notes (Signed)
MD Eulis Foster informed of critical hemoglobin level of 6.2

## 2018-10-07 ENCOUNTER — Inpatient Hospital Stay (HOSPITAL_COMMUNITY): Payer: 59

## 2018-10-07 DIAGNOSIS — Z832 Family history of diseases of the blood and blood-forming organs and certain disorders involving the immune mechanism: Secondary | ICD-10-CM | POA: Diagnosis not present

## 2018-10-07 DIAGNOSIS — E1159 Type 2 diabetes mellitus with other circulatory complications: Secondary | ICD-10-CM | POA: Diagnosis not present

## 2018-10-07 DIAGNOSIS — Z8249 Family history of ischemic heart disease and other diseases of the circulatory system: Secondary | ICD-10-CM | POA: Diagnosis not present

## 2018-10-07 DIAGNOSIS — I639 Cerebral infarction, unspecified: Secondary | ICD-10-CM | POA: Diagnosis not present

## 2018-10-07 DIAGNOSIS — N189 Chronic kidney disease, unspecified: Secondary | ICD-10-CM | POA: Diagnosis not present

## 2018-10-07 DIAGNOSIS — R319 Hematuria, unspecified: Secondary | ICD-10-CM | POA: Diagnosis not present

## 2018-10-07 DIAGNOSIS — N179 Acute kidney failure, unspecified: Secondary | ICD-10-CM | POA: Diagnosis present

## 2018-10-07 DIAGNOSIS — I509 Heart failure, unspecified: Secondary | ICD-10-CM | POA: Diagnosis not present

## 2018-10-07 DIAGNOSIS — Z9114 Patient's other noncompliance with medication regimen: Secondary | ICD-10-CM | POA: Diagnosis not present

## 2018-10-07 DIAGNOSIS — E1322 Other specified diabetes mellitus with diabetic chronic kidney disease: Secondary | ICD-10-CM | POA: Diagnosis present

## 2018-10-07 DIAGNOSIS — N184 Chronic kidney disease, stage 4 (severe): Secondary | ICD-10-CM | POA: Diagnosis present

## 2018-10-07 DIAGNOSIS — I82409 Acute embolism and thrombosis of unspecified deep veins of unspecified lower extremity: Secondary | ICD-10-CM | POA: Diagnosis not present

## 2018-10-07 DIAGNOSIS — Z79899 Other long term (current) drug therapy: Secondary | ICD-10-CM | POA: Diagnosis not present

## 2018-10-07 DIAGNOSIS — I130 Hypertensive heart and chronic kidney disease with heart failure and stage 1 through stage 4 chronic kidney disease, or unspecified chronic kidney disease: Secondary | ICD-10-CM | POA: Diagnosis present

## 2018-10-07 DIAGNOSIS — E1351 Other specified diabetes mellitus with diabetic peripheral angiopathy without gangrene: Secondary | ICD-10-CM | POA: Diagnosis present

## 2018-10-07 DIAGNOSIS — I5033 Acute on chronic diastolic (congestive) heart failure: Secondary | ICD-10-CM | POA: Diagnosis present

## 2018-10-07 DIAGNOSIS — E133593 Other specified diabetes mellitus with proliferative diabetic retinopathy without macular edema, bilateral: Secondary | ICD-10-CM | POA: Diagnosis present

## 2018-10-07 DIAGNOSIS — N99840 Postprocedural hematoma of a genitourinary system organ or structure following a genitourinary system procedure: Secondary | ICD-10-CM | POA: Diagnosis not present

## 2018-10-07 DIAGNOSIS — Z20828 Contact with and (suspected) exposure to other viral communicable diseases: Secondary | ICD-10-CM | POA: Diagnosis present

## 2018-10-07 DIAGNOSIS — D62 Acute posthemorrhagic anemia: Secondary | ICD-10-CM | POA: Diagnosis present

## 2018-10-07 DIAGNOSIS — Z833 Family history of diabetes mellitus: Secondary | ICD-10-CM | POA: Diagnosis not present

## 2018-10-07 DIAGNOSIS — D631 Anemia in chronic kidney disease: Secondary | ICD-10-CM | POA: Diagnosis present

## 2018-10-07 DIAGNOSIS — Z8673 Personal history of transient ischemic attack (TIA), and cerebral infarction without residual deficits: Secondary | ICD-10-CM | POA: Diagnosis not present

## 2018-10-07 DIAGNOSIS — Z823 Family history of stroke: Secondary | ICD-10-CM | POA: Diagnosis not present

## 2018-10-07 DIAGNOSIS — Z7982 Long term (current) use of aspirin: Secondary | ICD-10-CM | POA: Diagnosis not present

## 2018-10-07 LAB — IRON AND TIBC
Iron: 46 ug/dL (ref 45–182)
Saturation Ratios: 16 % — ABNORMAL LOW (ref 17.9–39.5)
TIBC: 280 ug/dL (ref 250–450)
UIBC: 234 ug/dL

## 2018-10-07 LAB — LIPID PANEL
Cholesterol: 143 mg/dL (ref 0–200)
HDL: 39 mg/dL — ABNORMAL LOW (ref >40)
LDL Cholesterol: 94 mg/dL (ref 0–99)
Total CHOL/HDL Ratio: 3.7 RATIO
Triglycerides: 49 mg/dL (ref <150)
VLDL: 10 mg/dL (ref 0–40)

## 2018-10-07 LAB — BASIC METABOLIC PANEL
Anion gap: 8 (ref 5–15)
Anion gap: 9 (ref 5–15)
BUN: 39 mg/dL — ABNORMAL HIGH (ref 6–20)
BUN: 40 mg/dL — ABNORMAL HIGH (ref 6–20)
CO2: 20 mmol/L — ABNORMAL LOW (ref 22–32)
CO2: 21 mmol/L — ABNORMAL LOW (ref 22–32)
Calcium: 8.8 mg/dL — ABNORMAL LOW (ref 8.9–10.3)
Calcium: 8.8 mg/dL — ABNORMAL LOW (ref 8.9–10.3)
Chloride: 111 mmol/L (ref 98–111)
Chloride: 112 mmol/L — ABNORMAL HIGH (ref 98–111)
Creatinine, Ser: 3.55 mg/dL — ABNORMAL HIGH (ref 0.61–1.24)
Creatinine, Ser: 3.85 mg/dL — ABNORMAL HIGH (ref 0.61–1.24)
GFR calc Af Amer: 21 mL/min — ABNORMAL LOW (ref >60)
GFR calc Af Amer: 24 mL/min — ABNORMAL LOW (ref >60)
GFR calc non Af Amer: 18 mL/min — ABNORMAL LOW (ref >60)
GFR calc non Af Amer: 20 mL/min — ABNORMAL LOW (ref >60)
Glucose, Bld: 107 mg/dL — ABNORMAL HIGH (ref 70–99)
Glucose, Bld: 142 mg/dL — ABNORMAL HIGH (ref 70–99)
Potassium: 4.5 mmol/L (ref 3.5–5.1)
Potassium: 5 mmol/L (ref 3.5–5.1)
Sodium: 140 mmol/L (ref 135–145)
Sodium: 141 mmol/L (ref 135–145)

## 2018-10-07 LAB — CBC
HCT: 25.2 % — ABNORMAL LOW (ref 39.0–52.0)
Hemoglobin: 8 g/dL — ABNORMAL LOW (ref 13.0–17.0)
MCH: 25.4 pg — ABNORMAL LOW (ref 26.0–34.0)
MCHC: 31.7 g/dL (ref 30.0–36.0)
MCV: 80 fL (ref 80.0–100.0)
Platelets: 221 10*3/uL (ref 150–400)
RBC: 3.15 MIL/uL — ABNORMAL LOW (ref 4.22–5.81)
RDW: 16.7 % — ABNORMAL HIGH (ref 11.5–15.5)
WBC: 5.9 10*3/uL (ref 4.0–10.5)
nRBC: 0.3 % — ABNORMAL HIGH (ref 0.0–0.2)

## 2018-10-07 LAB — URINALYSIS, ROUTINE W REFLEX MICROSCOPIC
Bilirubin Urine: NEGATIVE
Glucose, UA: NEGATIVE mg/dL
Ketones, ur: NEGATIVE mg/dL
Leukocytes,Ua: NEGATIVE
Nitrite: NEGATIVE
Protein, ur: 100 mg/dL — AB
Specific Gravity, Urine: 1.01 (ref 1.005–1.030)
pH: 5 (ref 5.0–8.0)

## 2018-10-07 LAB — RETICULOCYTES
Immature Retic Fract: 13.8 % (ref 2.3–15.9)
RBC.: 2.56 MIL/uL — ABNORMAL LOW (ref 4.22–5.81)
Retic Count, Absolute: 50.9 10*3/uL (ref 19.0–186.0)
Retic Ct Pct: 2 % (ref 0.4–3.1)

## 2018-10-07 LAB — HEMOGLOBIN AND HEMATOCRIT, BLOOD
HCT: 20.6 % — ABNORMAL LOW (ref 39.0–52.0)
Hemoglobin: 6.6 g/dL — CL (ref 13.0–17.0)

## 2018-10-07 LAB — GLUCOSE, CAPILLARY
Glucose-Capillary: 104 mg/dL — ABNORMAL HIGH (ref 70–99)
Glucose-Capillary: 145 mg/dL — ABNORMAL HIGH (ref 70–99)
Glucose-Capillary: 177 mg/dL — ABNORMAL HIGH (ref 70–99)
Glucose-Capillary: 84 mg/dL (ref 70–99)

## 2018-10-07 LAB — FOLATE: Folate: 13.3 ng/mL (ref >5.9)

## 2018-10-07 LAB — CREATININE, URINE, RANDOM: Creatinine, Urine: 77.07 mg/dL

## 2018-10-07 LAB — HEMOGLOBIN A1C
Hgb A1c MFr Bld: 6.4 % — ABNORMAL HIGH (ref 4.8–5.6)
Mean Plasma Glucose: 136.98 mg/dL

## 2018-10-07 LAB — SARS CORONAVIRUS 2 BY RT PCR (HOSPITAL ORDER, PERFORMED IN ~~LOC~~ HOSPITAL LAB): SARS Coronavirus 2: NEGATIVE

## 2018-10-07 LAB — PROTEIN / CREATININE RATIO, URINE
Creatinine, Urine: 79.23 mg/dL
Total Protein, Urine: 257 mg/dL

## 2018-10-07 LAB — FERRITIN: Ferritin: 304 ng/mL (ref 24–336)

## 2018-10-07 LAB — TSH: TSH: 2.867 u[IU]/mL (ref 0.350–4.500)

## 2018-10-07 LAB — PREPARE RBC (CROSSMATCH)

## 2018-10-07 LAB — VITAMIN B12: Vitamin B-12: 500 pg/mL (ref 180–914)

## 2018-10-07 LAB — OCCULT BLOOD X 1 CARD TO LAB, STOOL: Fecal Occult Bld: NEGATIVE

## 2018-10-07 LAB — SODIUM, URINE, RANDOM: Sodium, Ur: 74 mmol/L

## 2018-10-07 MED ORDER — SODIUM CHLORIDE 0.9% IV SOLUTION: Freq: Once | INTRAVENOUS | Status: DC

## 2018-10-07 MED ORDER — AMLODIPINE BESYLATE 5 MG PO TABS
5.0000 mg | ORAL_TABLET | Freq: Every day | ORAL | Status: DC
  Administered 2018-10-07 – 2018-10-10 (×4): 5 mg via ORAL
  Filled 2018-10-07 (×4): qty 1

## 2018-10-07 NOTE — Consult Note (Signed)
[facility] Renal Consultation Note  Requesting MD: Regalado Indication for Consultation:  AKI   Chief complaint: shortness of breath  HPI:  Bill [name] is a 38 y.o. male with a history of CVA, DM and HTN who presented with shortness of breath and cough.  He also reported worsening swelling over the past few days.  No issues with opening his eyes.  He denies any trouble with nausea or vomiting.  He reports infrequent past NSAID use.  Denies any blood per rectum or dark stools or hematemesis.  He denies knowing about any issues with his kidneys previously.  Note that he does not have a PCP.  He was found to have AKI with Cr 3.55 compared to baseline Cr 1 (from 01/2018).  Also found to have hemoglobin of 6.2.  Note that he had a negative COVID test as an outpatient and a negative result here at [facility].  No family members with ESRD.   Creatinine, Ser  Date/Time Value Ref Range Status  10/07/2018 06:25 AM 3.55 (H) 0.61 - 1.24 mg/dL Final  10/06/2018 11:36 PM 3.85 (H) 0.61 - 1.24 mg/dL Final  10/06/2018 01:58 PM 3.55 (H) 0.61 - 1.24 mg/dL Final  01/25/2018 04:00 AM 1.11 0.61 - 1.24 mg/dL Final  01/24/2018 06:07 PM 1.19 0.61 - 1.24 mg/dL Final    PMHx:   Past Medical History:  Diagnosis Date  . Diabetes mellitus without complication ([place])   . Diabetic retinopathy ([place])    PDR OU  . Stroke [facility])     Past Surgical History:  Procedure Laterality Date  . arm surgery      Family Hx:  Family History  Problem Relation Age of Onset  . Diabetes Mellitus II Mother   . Hypertension Mother   . Cataracts Mother   . Diabetes Mellitus II Father   . Stroke Father   . Hypertension Father   . Glaucoma Maternal Grandmother   . Amblyopia Neg Hx   . Blindness Neg Hx   . Macular degeneration Neg Hx   . Retinal detachment Neg Hx   . Strabismus Neg Hx   . Retinitis pigmentosa Neg Hx     Social History:  reports that he has never smoked. He has never used smokeless tobacco. He  reports current alcohol use. No history on file for drug.  Allergies: No Known Allergies  Medications: Prior to Admission medications   Medication Sig Start Date End Date Taking? Authorizing Provider  amLODipine (NORVASC) 10 MG tablet Take 1 tablet (10 mg total) by mouth daily. 01/26/18 01/26/19 Yes Nita Sells, MD  atorvastatin (LIPITOR) 80 MG tablet Take 1 tablet (80 mg total) by mouth daily at 6 PM. Patient taking differently: Take 80 mg by mouth daily.  01/26/18  Yes Nita Sells, MD  Ferrous Sulfate (IRON PO) Take 1 tablet by mouth daily.   Yes [provider]  losartan-hydrochlorothiazide (HYZAAR) 50-12.5 MG tablet Take 1 tablet by mouth daily. 07/17/18  Yes [provider]  metFORMIN (GLUCOPHAGE) 500 MG tablet Take 500 mg by mouth 2 (two) times daily with a meal.   Yes [provider]  aspirin 81 MG EC tablet Take 1 tablet (81 mg total) by mouth daily. Patient not taking: Reported on 10/06/2018 01/26/18   Nita Sells, MD  hydrochlorothiazide (HYDRODIURIL) 25 MG tablet Take 1 tablet (25 mg total) by mouth daily. Patient not taking: Reported on 10/06/2018 01/24/18 02/23/18  Long, Wonda [employer], MD    I have reviewed the patient's current  medications.  (please note that the patient states to me that he is taking both the HCTZ and the losartan-HCTZ)  Labs:  BMP Latest Ref Rng & Units 10/07/2018 10/06/2018 10/06/2018  Glucose 70 - 99 mg/dL 107(H) 142(H) 136(H)  BUN 6 - 20 mg/dL 39(H) 40(H) 37(H)  Creatinine 0.61 - 1.24 mg/dL 3.55(H) 3.85(H) 3.55(H)  Sodium 135 - 145 mmol/L 140 141 139  Potassium 3.5 - 5.1 mmol/L 5.0 4.5 4.6  Chloride 98 - 111 mmol/L 111 112(H) 111  CO2 22 - 32 mmol/L 20(L) 21(L) 18(L)  Calcium 8.9 - 10.3 mg/dL 8.8(L) 8.8(L) 8.5(L)    Urinalysis    Component Value Date/Time   COLORURINE STRAW (A) 10/07/2018 0912   APPEARANCEUR CLEAR 10/07/2018 0912   LABSPEC 1.010 10/07/2018 0912   PHURINE 5.0 10/07/2018 0912   GLUCOSEU  NEGATIVE 10/07/2018 0912   HGBUR MODERATE (A) 10/07/2018 0912   BILIRUBINUR NEGATIVE 10/07/2018 0912   KETONESUR NEGATIVE 10/07/2018 0912   PROTEINUR 100 (A) 10/07/2018 0912   NITRITE NEGATIVE 10/07/2018 0912   LEUKOCYTESUR NEGATIVE 10/07/2018 0912     ROS:  Pertinent items noted in HPI and remainder of comprehensive ROS otherwise negative.  Physical Exam: Vitals:   10/07/18 0902 10/07/18 1715  BP: (!) 158/81 (!) 158/92  Pulse: 92 90  Resp:    Temp:    SpO2: 100%      General: Adult male seated in no acute distress HEENT: Normocephalic atraumatic Eyes: Extraocular movements intact sclera anicteric.  No periorbital edema Neck: Supple trachea midline Heart: S1-S2 no rub Lungs: Clear to auscultation bilaterally normal work of breathing at rest Abdomen: Soft nontender nondistended normal bowel sounds Extremities: 2+ pitting edema bilateral lower extremities Skin: No rash on extremities exposed Neuro: Alert and oriented x3 provides a history and follows commands Psych normal mood and affect  Assessment/Plan:  # AKI on CKD.   - Previously with proteinuria with preserved GFR.  Now with pre-renal insults in the setting of HCTZ-ARB. Proteinuria and hematuria noted.  Ruling out GN as below - baseline Cr 1  - Defer hydration given overload.  Defer additional lasix right now as no acute distress - Hold losartan-HCTZ and HCTZ - Agree with renal ultrasound  # proteinuria - Secondary in part to diabetes.  Note diabetic retinopathy and hx CVA.  Note accompanying hematuria  - UPEP, SPEP are ordered.  Send free light chains, hepatitis.  Note HIV negative 01/2018 - quantify up/cr ratio  - Serum complement, ANA, ANCA  # Anemia - Secondary in part to AKI - improving s/p transfusion  # Dyspnea  - COVID ruled out.  Patient with volume overload  - Anemia contributing as well   # COVID rule-out  - Tested negative outpatient and here at [facility]  # HTN  - amlodipine is ordered     Claudia Desanctis 10/07/2018, 6:36 PM

## 2018-10-07 NOTE — Progress Notes (Signed)
Noted hgb to be 6.6. Page to on call MD (Dr Kennon Holter) informing of current hgb. (after 1 unit prbc was 6.2 prior.) Awaiting orders

## 2018-10-07 NOTE — Progress Notes (Signed)
Received a faxed copy of NEGATIVE covid test result done at [facility]. Md made aware.

## 2018-10-07 NOTE — Progress Notes (Signed)
PROGRESS NOTE    Bill [name]  KPT:[id number] DOB: [birth year] DOA: 10/06/2018 PCP: Patient, No Pcp Per    Brief Narrative: 38 year old male with past medical history significant for nonhemorrhagic stroke, type 2 diabetes, hypertension who initially presented to the urgent care with shortness of breath that is started in night prior to presentation.  He denies fever but he report occasional dry cough.  Patient denies melena hematochezia.  He report worsening lower extremity edema since Wednesday prior to admission.  He also reports orthopnea.  COVID 19 test was sent out from urgent care center.  Pending results at this time. With severe anemia with a hemoglobin at 6, acute renal failure with a creatinine at 3.  5.    Assessment & Plan:   Active Problems:   Acute CVA (cerebrovascular accident) ([place])   Type 2 diabetes mellitus with vascular disease ([place])   Anemia  1-COVID Rule out; Test pending. Send from Urgent care center.  He presents with cough and SOB.   2-Anemia;  Patient denies any history of melena, hematochezia, hematemesis. Iron level normal at 46, ferritin 170, folic acid 13 vitamin B 500. No evidence of active bleeding. He will need GI evaluation as an outpatient.  Or inpatient if hemoglobin starts to drop again. His anemia could be related to renal disease. He received 2 units PRBC. Hb increase to 8.   3-AKI; Patient present with lower extremity edema, orthopnea, worsening renal function. Creatinine 8 months ago was at 1.1. check UA, urine na, cr.  He presents with creatinine at 3.5. He received a dose of IV Lasix yesterday.  Agree with getting renal ultrasound. Unclear etiology of acute kidney injury, denies diarrhea, no evidence of dehydration, will hold losartan.  Renal failure could be related to diabetes, and hypertension. Nephrology has been consulted. Will check UPEP SPEP. Defer to their IV Lasix to nephrology.  4-HTN; continue with Norvasc.   5-DM  type 2; Continue SSI. Hold metformin.   6-LE edema; check doppler. Doppler.       Estimated body mass index is 27.48 kg/m as calculated from the following:   Height as of this encounter: 5\' 11"  (1.803 m).   Weight as of this encounter: 89.4 kg.   DVT prophylaxis: SCD no anticoagulation due to anemia.  Code Status: full code.  Family Communication: mother over phone Disposition Plan: remain in the hospital for treatment of renal failure, anemia.   Consultants:   Nephrology    Procedures:      Antimicrobials:   none   Subjective: He report orthopnea.  Worsening LE edema since 4 days ago   Objective: Vitals:   10/06/18 2126 10/07/18 0152 10/07/18 0227 10/07/18 0445  BP: (!) 164/89 (!) 185/97 (!) 168/94 (!) 172/94  Pulse: 90 97 89 91  Resp: 20   18  Temp: 98.2 F (36.8 C) 97.8 F (36.6 C) 98 F (36.7 C) 98.1 F (36.7 C)  TempSrc: Oral Oral Oral Oral  SpO2: 100% 98% 98% 99%  Weight:      Height:        Intake/Output Summary (Last 24 hours) at 10/07/2018 0825 Last data filed at 10/07/2018 0445 Gross per 24 hour  Intake 666 ml  Output 400 ml  Net 266 ml   Filed Weights   10/06/18 1234  Weight: 89.4 kg    Examination:  General exam: Appears calm and comfortable  Respiratory system: Clear to auscultation. Respiratory effort normal. Cardiovascular system: S1 & S2 heard, RRR. No JVD, murmurs,  rubs, gallops or clicks.  Gastrointestinal system: Abdomen is nondistended, soft and nontender. No organomegaly or masses felt. Normal bowel sounds heard. Central nervous system: Alert and oriented. No focal neurological deficits. Extremities: Symmetric 5 x 5 power. Plus 2 edema Skin: No rashes, lesions or ulcers    Data Reviewed: I have personally reviewed following labs and imaging studies  CBC: Recent Labs  Lab 10/06/18 1358 10/06/18 2336 10/07/18 0625  WBC 6.3  --  5.9  NEUTROABS 4.8  --   --   HGB 6.2* 6.6* 8.0*  HCT 20.5* 20.6* 25.2*  MCV 80.4   --  80.0  PLT 240  --  662   Basic Metabolic Panel: Recent Labs  Lab 10/06/18 1358 10/06/18 2336 10/07/18 0625  NA 139 141 140  K 4.6 4.5 5.0  CL 111 112* 111  CO2 18* 21* 20*  GLUCOSE 136* 142* 107*  BUN 37* 40* 39*  CREATININE 3.55* 3.85* 3.55*  CALCIUM 8.5* 8.8* 8.8*   GFR: Estimated Creatinine Clearance: 29.8 mL/min (A) (by C-G formula based on SCr of 3.55 mg/dL (H)). Liver Function Tests: Recent Labs  Lab 10/06/18 1358  AST 27  ALT 27  ALKPHOS 59  BILITOT 0.5  PROT 7.3  ALBUMIN 3.0*   No results for input(s): LIPASE, AMYLASE in the last 168 hours. No results for input(s): AMMONIA in the last 168 hours. Coagulation Profile: No results for input(s): INR, PROTIME in the last 168 hours. Cardiac Enzymes: Recent Labs  Lab 10/06/18 1358  TROPONINI <0.03   BNP (last 3 results) No results for input(s): PROBNP in the last 8760 hours. HbA1C: Recent Labs    10/06/18 2336  HGBA1C 6.4*   CBG: Recent Labs  Lab 10/06/18 2122  GLUCAP 97   Lipid Profile: Recent Labs    10/07/18 0625  CHOL 143  HDL 39*  LDLCALC 94  TRIG 49  CHOLHDL 3.7   Thyroid Function Tests: Recent Labs    10/07/18 0625  TSH 2.867   Anemia Panel: Recent Labs    10/06/18 2336  VITAMINB12 500  FOLATE 13.3  FERRITIN 304  TIBC 280  IRON 46  RETICCTPCT 2.0   Sepsis Labs: No results for input(s): PROCALCITON, LATICACIDVEN in the last 168 hours.  No results found for this or any previous visit (from the past 240 hour(s)).       Radiology Studies: Dg Chest Port 1 View  Result Date: 10/06/2018 CLINICAL DATA:  Leg swelling and short of breath EXAM: PORTABLE CHEST 1 VIEW COMPARISON:  09/01/2005 FINDINGS: Normal heart size. Lungs are under aerated with bibasilar atelectasis. No pneumothorax or pleural effusion. IMPRESSION: Normal heart size. Lungs clear. No pneumothorax. No pleural effusion. Electronically Signed   By: Marybelle Killings M.D.   On: 10/06/2018 16:19         Scheduled Meds: . sodium chloride   Intravenous Once  . aspirin EC  81 mg Oral Daily  . atorvastatin  80 mg Oral q1800  . insulin aspart  0-5 Units Subcutaneous QHS  . insulin aspart  0-9 Units Subcutaneous TID WC   Continuous Infusions:   LOS: 0 days    Time spent: 35 minutes.     Elmarie Shiley, MD [facility] Pager [phone]  If 7PM-7AM, please contact night-coverage [email or web address] Password [email or web address] 10/07/2018, 8:25 AM

## 2018-10-08 ENCOUNTER — Inpatient Hospital Stay (HOSPITAL_COMMUNITY): Payer: 59

## 2018-10-08 DIAGNOSIS — I82409 Acute embolism and thrombosis of unspecified deep veins of unspecified lower extremity: Secondary | ICD-10-CM

## 2018-10-08 DIAGNOSIS — I509 Heart failure, unspecified: Secondary | ICD-10-CM

## 2018-10-08 LAB — HEPATITIS PANEL, ACUTE
HCV Ab: <0.1 s/co ratio (ref 0.0–0.9)
Hep A IgM: NEGATIVE
Hep B C IgM: NEGATIVE
Hepatitis B Surface Ag: NEGATIVE

## 2018-10-08 LAB — TYPE AND SCREEN
ABO/RH(D): A POS
Antibody Screen: NEGATIVE
Unit division: 0
Unit division: 0

## 2018-10-08 LAB — CBC
HCT: 23.7 % — ABNORMAL LOW (ref 39.0–52.0)
Hemoglobin: 7.6 g/dL — ABNORMAL LOW (ref 13.0–17.0)
MCH: 25.5 pg — ABNORMAL LOW (ref 26.0–34.0)
MCHC: 32.1 g/dL (ref 30.0–36.0)
MCV: 79.5 fL — ABNORMAL LOW (ref 80.0–100.0)
Platelets: 224 10*3/uL (ref 150–400)
RBC: 2.98 MIL/uL — ABNORMAL LOW (ref 4.22–5.81)
RDW: 16.6 % — ABNORMAL HIGH (ref 11.5–15.5)
WBC: 5.2 10*3/uL (ref 4.0–10.5)
nRBC: 0 % (ref 0.0–0.2)

## 2018-10-08 LAB — ECHOCARDIOGRAM COMPLETE
Height: 71 in
Weight: 3152 oz

## 2018-10-08 LAB — GLUCOSE, CAPILLARY
Glucose-Capillary: 109 mg/dL — ABNORMAL HIGH (ref 70–99)
Glucose-Capillary: 143 mg/dL — ABNORMAL HIGH (ref 70–99)
Glucose-Capillary: 114 mg/dL — ABNORMAL HIGH (ref 70–99)
Glucose-Capillary: 131 mg/dL — ABNORMAL HIGH (ref 70–99)

## 2018-10-08 LAB — BPAM RBC
Blood Product Expiration Date: 202005042359
Blood Product Expiration Date: 202005042359
ISSUE DATE / TIME: 202004251823
ISSUE DATE / TIME: 202004260200
Unit Type and Rh: 6200
Unit Type and Rh: 6200

## 2018-10-08 LAB — BASIC METABOLIC PANEL
Anion gap: 8 (ref 5–15)
BUN: 44 mg/dL — ABNORMAL HIGH (ref 6–20)
CO2: 21 mmol/L — ABNORMAL LOW (ref 22–32)
Calcium: 8.6 mg/dL — ABNORMAL LOW (ref 8.9–10.3)
Chloride: 112 mmol/L — ABNORMAL HIGH (ref 98–111)
Creatinine, Ser: 3.65 mg/dL — ABNORMAL HIGH (ref 0.61–1.24)
GFR calc Af Amer: 23 mL/min — ABNORMAL LOW (ref >60)
GFR calc non Af Amer: 20 mL/min — ABNORMAL LOW (ref >60)
Glucose, Bld: 115 mg/dL — ABNORMAL HIGH (ref 70–99)
Potassium: 4.4 mmol/L (ref 3.5–5.1)
Sodium: 141 mmol/L (ref 135–145)

## 2018-10-08 LAB — PROTEIN ELECTROPHORESIS, SERUM
A/G Ratio: 0.8 (ref 0.7–1.7)
Albumin ELP: 2.9 g/dL (ref 2.9–4.4)
Alpha-1-Globulin: 0.2 g/dL (ref 0.0–0.4)
Alpha-2-Globulin: 0.8 g/dL (ref 0.4–1.0)
Beta Globulin: 1 g/dL (ref 0.7–1.3)
Gamma Globulin: 1.5 g/dL (ref 0.4–1.8)
Globulin, Total: 3.6 g/dL (ref 2.2–3.9)
Total Protein ELP: 6.5 g/dL (ref 6.0–8.5)

## 2018-10-08 LAB — KAPPA/LAMBDA LIGHT CHAINS
Kappa free light chain: 126.1 mg/L — ABNORMAL HIGH (ref 3.3–19.4)
Kappa, lambda light chain ratio: 1.74 — ABNORMAL HIGH (ref 0.26–1.65)
Lambda free light chains: 72.3 mg/L — ABNORMAL HIGH (ref 5.7–26.3)

## 2018-10-08 LAB — C4 COMPLEMENT: Complement C4, Body Fluid: 39 mg/dL (ref 14–44)

## 2018-10-08 LAB — C3 COMPLEMENT: C3 Complement: 128 mg/dL (ref 82–167)

## 2018-10-08 LAB — ANA: Anti Nuclear Antibody (ANA): NEGATIVE

## 2018-10-08 MED ORDER — FUROSEMIDE 10 MG/ML IJ SOLN
120.0000 mg | Freq: Two times a day (BID) | INTRAVENOUS | Status: DC
  Administered 2018-10-08 – 2018-10-09 (×2): 120 mg via INTRAVENOUS
  Filled 2018-10-08: qty 10
  Filled 2018-10-08: qty 12
  Filled 2018-10-08: qty 10
  Filled 2018-10-08: qty 12

## 2018-10-08 MED ORDER — DARBEPOETIN ALFA 100 MCG/0.5ML IJ SOSY
100.0000 ug | PREFILLED_SYRINGE | Freq: Once | INTRAMUSCULAR | Status: AC
  Administered 2018-10-08: 100 ug via SUBCUTANEOUS
  Filled 2018-10-08: qty 0.5

## 2018-10-08 MED ORDER — FERROUS SULFATE 325 (65 FE) MG PO TABS: 325.0000 mg | ORAL_TABLET | Freq: Two times a day (BID) | ORAL | Status: DC

## 2018-10-08 MED ORDER — SODIUM CHLORIDE 0.9 % IV SOLN
510.0000 mg | Freq: Once | INTRAVENOUS | Status: AC
  Administered 2018-10-08: 510 mg via INTRAVENOUS
  Filled 2018-10-08: qty 17

## 2018-10-08 NOTE — Progress Notes (Signed)
Bilateral lower extremity venous duplex has been completed. Preliminary results can be found in CV Proc through chart review.   10/08/18 12:31 PM Bill [name] RVT

## 2018-10-08 NOTE — Progress Notes (Signed)
Admit: 10/06/2018 LOS: 1  21M with renal failure (unclear if acute / subacute), > 20y DM2 + DPR, Hypervolemia, anemia Hb 6.2 MCV 80  Subjective:  . NO interval events . Still with 4+ LEE . Renal US w/o obstruction or masses; inc echogenicity  . 24h U Protein collection pending . UA with 11-20 RBC/HPF . ANA neg, SFLC consistent with reduced GFR; historical neg HIV . Mother updated by phone  04/26 0701 - 04/27 0700 In: 1160 [P.O.:1160] Out: 1400 [Urine:1400]  Filed Weights   10/06/18 1234  Weight: 89.4 kg    Scheduled Meds: . sodium chloride   Intravenous Once  . amLODipine  5 mg Oral Daily  . atorvastatin  80 mg Oral q1800  . insulin aspart  0-5 Units Subcutaneous QHS  . insulin aspart  0-9 Units Subcutaneous TID WC   Continuous Infusions: PRN Meds:.  Current Labs: reviewed   Ref. Range 10/07/2018 19:17  Kappa, lamda light chain ratio Latest Ref Range: 0.26 - 1.65  1.74 (H)    Ref. Range 10/06/2018 23:36  Saturation Ratios Latest Ref Range: 17.9 - 39.5 % 16 (L)  Ferritin Latest Ref Range: 24 - 336 ng/mL 304   Results for Bill [name], Bill [name] (MRN [id number]) as of 10/08/2018 14:36  Ref. Range 10/07/2018 19:17  Anti Nuclear Antibody (ANA) Latest Ref Range: Negative  Negative    Physical Exam:  Blood pressure (!) 156/85, pulse 84, temperature 98.3 F (36.8 C), temperature source Oral, resp. rate 20, height 5\' 11"  (1.803 m), weight 89.4 kg, SpO2 97 %. NAD, young male RRR nl s1s2 no rub CTAB 4+ LEE, pitting, to knees NO rashes/lesions Nonfocal, AAO x3; no asterixus  A 1. Progressive Renal Failure over past 73mo, unclear if acute or subacute; sig proteinuria 24h U Protein in process; hematuria present; On ARB at presentation 2. Hypervolemia 3. DM2, hx/o proliferative retinopathy, present for at least 18y 4. ANemia, mild Fe deficiency. No overt losses 5. COVID neg at admission  P . I think certainly has large, or dominant component (or single) diabetic nephropathy;  hematuria and proteinuria not surprising . Given youth, agree with serological workup . Given hypervolemia,start furosemide 120 IV BID . RD consult for low Na diet . Follow up studies, I&Os tomorrow . Start ESA, aranesp 161mcg today; fereheme 510mg  IV x1 . Medication Issues; o Preferred narcotic agents for pain control are hydromorphone, fentanyl, and methadone. Morphine should not be used.  o Baclofen should be avoided o Avoid oral sodium phosphate and magnesium citrate based laxatives / bowel preps    Pearson Grippe MD 10/08/2018, 2:36 PM  Recent Labs  Lab 10/06/18 2336 10/07/18 0625 10/08/18 0643  NA 141 140 141  K 4.5 5.0 4.4  CL 112* 111 112*  CO2 21* 20* 21*  GLUCOSE 142* 107* 115*  BUN 40* 39* 44*  CREATININE 3.85* 3.55* 3.65*  CALCIUM 8.8* 8.8* 8.6*   Recent Labs  Lab 10/06/18 1358 10/06/18 2336 10/07/18 0625 10/08/18 0643  WBC 6.3  --  5.9 5.2  NEUTROABS 4.8  --   --   --   HGB 6.2* 6.6* 8.0* 7.6*  HCT 20.5* 20.6* 25.2* 23.7*  MCV 80.4  --  80.0 79.5*  PLT 240  --  221 224

## 2018-10-08 NOTE — Progress Notes (Signed)
PROGRESS NOTE    Bill [name]  URK:[id number] DOB: August 19, 1980 DOA: 10/06/2018 PCP: Patient, No Pcp Per    Brief Narrative: 38 year old male with past medical history significant for nonhemorrhagic stroke, type 2 diabetes, hypertension who initially presented to the urgent care with shortness of breath that is started in night prior to presentation.  He denies fever but he report occasional dry cough.  Patient denies melena hematochezia.  He report worsening lower extremity edema since Wednesday prior to admission.  He also reports orthopnea.  COVID 19 test was sent out from urgent care center.  Pending results at this time. With severe anemia with a hemoglobin at 6, Acute renal failure with a creatinine at 3.5.    Assessment & Plan:   Active Problems:   Acute CVA (cerebrovascular accident) ([place])   Type 2 diabetes mellitus with vascular disease ([place])   Anemia  1-COVID 19 was Rule out; Test Negative times 2, one from [facility] urgent care center.  He presents with cough and SOB.   2-Anemia;  Patient denies any history of melena, hematochezia, hematemesis. Iron level normal at 46, ferritin 831, folic acid 13 vitamin B 500. No evidence of active bleeding. He will need GI evaluation as an outpatient.  Or inpatient if hemoglobin starts to drop again. His anemia could be related to renal disease. He received 2 units PRBC.  Hb today at 7.6. follow trend.  Defer to nephrology aranesp /IV iron.   3-AKI;  Patient present with lower extremity edema, orthopnea, worsening renal function. Creatinine 8 months ago was at 1.1. He presents with creatinine at 3.5. He received a dose of IV Lasix 4-25. [place] negative for hydronephrosis, chronic medical diseases.  Nephrology has been consulted. UPEP SPEP.:  Defer to their IV Lasix to nephrology. ANA negative, Kappa light chain 126, Lamda 72,  Complement, ANCA, pending.  Proteinuria.  Urine out put 1.4 L in 24 hours.   4-HTN; continue with  Norvasc.   5-DM type 2; Continue SSI. Hold metformin.   6-LE edema; Doppler negative for DVT.  ECHO pending.        Estimated body mass index is 27.48 kg/m as calculated from the following:   Height as of this encounter: 5\' 11"  (1.803 m).   Weight as of this encounter: 89.4 kg.   DVT prophylaxis: SCD no anticoagulation due to anemia.  Code Status: full code.  Family Communication: mother over phone Disposition Plan: remain in the hospital for treatment of renal failure, anemia.   Consultants:   Nephrology    Procedures:      Antimicrobials:   none   Subjective: He denies dyspnea. LE edema persist   Objective: Vitals:   10/07/18 0902 10/07/18 1715 10/07/18 2321 10/08/18 0809  BP: (!) 158/81 (!) 158/92 (!) 145/74 (!) 156/85  Pulse: 92 90 90 84  Resp:   20   Temp:   98.2 F (36.8 C) 98.3 F (36.8 C)  TempSrc:   Oral Oral  SpO2: 100%  98% 97%  Weight:      Height:        Intake/Output Summary (Last 24 hours) at 10/08/2018 1344 Last data filed at 10/07/2018 1814 Gross per 24 hour  Intake 560 ml  Output 750 ml  Net -190 ml   Filed Weights   10/06/18 1234  Weight: 89.4 kg    Examination:  General exam: NAD Respiratory system: CTA Cardiovascular system: S 1, S 2 RRR  Gastrointestinal system: BS present, soft, nt Central nervous system:  Non focal.  Extremities: Plus 2 edema Skin: No rashes.   Data Reviewed: I have personally reviewed following labs and imaging studies  CBC: Recent Labs  Lab 10/06/18 1358 10/06/18 2336 10/07/18 0625 10/08/18 0643  WBC 6.3  --  5.9 5.2  NEUTROABS 4.8  --   --   --   HGB 6.2* 6.6* 8.0* 7.6*  HCT 20.5* 20.6* 25.2* 23.7*  MCV 80.4  --  80.0 79.5*  PLT 240  --  221 161   Basic Metabolic Panel: Recent Labs  Lab 10/06/18 1358 10/06/18 2336 10/07/18 0625 10/08/18 0643  NA 139 141 140 141  K 4.6 4.5 5.0 4.4  CL 111 112* 111 112*  CO2 18* 21* 20* 21*  GLUCOSE 136* 142* 107* 115*  BUN 37* 40* 39* 44*   CREATININE 3.55* 3.85* 3.55* 3.65*  CALCIUM 8.5* 8.8* 8.8* 8.6*   GFR: Estimated Creatinine Clearance: 28.9 mL/min (A) (by C-G formula based on SCr of 3.65 mg/dL (H)). Liver Function Tests: Recent Labs  Lab 10/06/18 1358  AST 27  ALT 27  ALKPHOS 59  BILITOT 0.5  PROT 7.3  ALBUMIN 3.0*   No results for input(s): LIPASE, AMYLASE in the last 168 hours. No results for input(s): AMMONIA in the last 168 hours. Coagulation Profile: No results for input(s): INR, PROTIME in the last 168 hours. Cardiac Enzymes: Recent Labs  Lab 10/06/18 1358  TROPONINI <0.03   BNP (last 3 results) No results for input(s): PROBNP in the last 8760 hours. HbA1C: Recent Labs    10/06/18 2336  HGBA1C 6.4*   CBG: Recent Labs  Lab 10/07/18 1108 10/07/18 1626 10/07/18 2138 10/08/18 0809 10/08/18 1142  GLUCAP 145* 177* 84 109* 143*   Lipid Profile: Recent Labs    10/07/18 0625  CHOL 143  HDL 39*  LDLCALC 94  TRIG 49  CHOLHDL 3.7   Thyroid Function Tests: Recent Labs    10/07/18 0625  TSH 2.867   Anemia Panel: Recent Labs    10/06/18 2336  VITAMINB12 500  FOLATE 13.3  FERRITIN 304  TIBC 280  IRON 46  RETICCTPCT 2.0   Sepsis Labs: No results for input(s): PROCALCITON, LATICACIDVEN in the last 168 hours.  Recent Results (from the past 240 hour(s))  SARS Coronavirus 2 [facility] order, Performed in [facility] hospital lab)     Status: None   Collection Time: 10/07/18  2:32 PM  Result Value Ref Range Status   SARS Coronavirus 2 NEGATIVE NEGATIVE Final    Comment: (NOTE) If result is NEGATIVE SARS-CoV-2 target nucleic acids are NOT DETECTED. The SARS-CoV-2 RNA is generally detectable in upper and lower  respiratory specimens during the acute phase of infection. The lowest  concentration of SARS-CoV-2 viral copies this assay can detect is 250  copies / mL. A negative result does not preclude SARS-CoV-2 infection  and should not be used as the sole basis for treatment or  other  patient management decisions.  A negative result may occur with  improper specimen collection / handling, submission of specimen other  than nasopharyngeal swab, presence of viral mutation(s) within the  areas targeted by this assay, and inadequate number of viral copies  (<250 copies / mL). A negative result must be combined with clinical  observations, patient history, and epidemiological information. If result is POSITIVE SARS-CoV-2 target nucleic acids are DETECTED. The SARS-CoV-2 RNA is generally detectable in upper and lower  respiratory specimens dur ing the acute phase of infection.  Positive  results are indicative of active infection with SARS-CoV-2.  Clinical  correlation with patient history and other diagnostic information is  necessary to determine patient infection status.  Positive results do  not rule out bacterial infection or co-infection with other viruses. If result is PRESUMPTIVE POSTIVE SARS-CoV-2 nucleic acids MAY BE PRESENT.   A presumptive positive result was obtained on the submitted specimen  and confirmed on repeat testing.  While 2019 novel coronavirus  (SARS-CoV-2) nucleic acids may be present in the submitted sample  additional confirmatory testing may be necessary for epidemiological  and / or clinical management purposes  to differentiate between  SARS-CoV-2 and other Sarbecovirus currently known to infect humans.  If clinically indicated additional testing with an alternate test  methodology [phone]) is advised. The SARS-CoV-2 RNA is generally  detectable in upper and lower respiratory sp ecimens during the acute  phase of infection. The expected result is Negative. Fact Sheet for Patients:  StrictlyIdeas.no Fact Sheet for Healthcare Providers: BankingDealers.co.za This test is not yet approved or cleared by the [place] FDA and has been authorized for detection and/or diagnosis of  SARS-CoV-2 by FDA under an Emergency Use Authorization (EUA).  This EUA will remain in effect (meaning this test can be used) for the duration of the COVID-19 declaration under Section 564(b)(1) of the Act, 21 U.S.C. section 360bbb-3(b)(1), unless the authorization is terminated or revoked sooner. Performed at [facility], [place] [address], [place],  [address]          Radiology Studies: US Renal  Result Date: 10/07/2018 CLINICAL DATA:  Initial evaluation for acute renal injury. EXAM: RENAL / URINARY TRACT ULTRASOUND COMPLETE COMPARISON:  None available. FINDINGS: Right Kidney: Renal measurements: 11.6 x 5.7 x 7.3 cm = volume: 247.4 mL. Increased echogenicity seen within the renal parenchyma. No mass or hydronephrosis visualized. Left Kidney: Renal measurements: 11.7 x 7.6 x 6.2 cm = volume: 286.8 mL. Increased echogenicity seen within the renal parenchyma. No mass or hydronephrosis visualized. Bladder: Appears normal for degree of bladder distention. Bilateral pleural effusions noted. IMPRESSION: 1. Increased echogenicity within the renal parenchyma bilaterally, compatible with medical renal disease. No hydronephrosis. 2. Bilateral pleural effusions. Electronically Signed   By: Jeannine Boga M.D.   On: 10/07/2018 20:00   Dg Chest Port 1 View  Result Date: 10/06/2018 CLINICAL DATA:  Leg swelling and short of breath EXAM: PORTABLE CHEST 1 VIEW COMPARISON:  09/01/2005 FINDINGS: Normal heart size. Lungs are under aerated with bibasilar atelectasis. No pneumothorax or pleural effusion. IMPRESSION: Normal heart size. Lungs clear. No pneumothorax. No pleural effusion. Electronically Signed   By: Marybelle Killings M.D.   On: 10/06/2018 16:19   Vas [place] Lower Extremity Venous (dvt)  Result Date: 10/08/2018  Lower Venous Study Indications: Swelling.  Performing Technologist: Oliver Hum RVT  Examination Guidelines: A complete evaluation includes B-mode imaging, spectral Doppler,  color Doppler, and power Doppler as needed of all accessible portions of each vessel. Bilateral testing is considered an integral part of a complete examination. Limited examinations for reoccurring indications may be performed as noted.  +---------+---------------+---------+-----------+----------+-------+ RIGHT    CompressibilityPhasicitySpontaneityPropertiesSummary +---------+---------------+---------+-----------+----------+-------+ CFV      Full           Yes      Yes                          +---------+---------------+---------+-----------+----------+-------+ SFJ      Full                                                 +---------+---------------+---------+-----------+----------+-------+  FV Prox  Full                                                 +---------+---------------+---------+-----------+----------+-------+ FV Mid   Full                                                 +---------+---------------+---------+-----------+----------+-------+ FV DistalFull                                                 +---------+---------------+---------+-----------+----------+-------+ PFV      Full                                                 +---------+---------------+---------+-----------+----------+-------+ POP      Full           Yes      Yes                          +---------+---------------+---------+-----------+----------+-------+ PTV      Full                                                 +---------+---------------+---------+-----------+----------+-------+ PERO     Full                                                 +---------+---------------+---------+-----------+----------+-------+   +---------+---------------+---------+-----------+----------+-------+ LEFT     CompressibilityPhasicitySpontaneityPropertiesSummary +---------+---------------+---------+-----------+----------+-------+ CFV      Full           Yes      Yes                           +---------+---------------+---------+-----------+----------+-------+ SFJ      Full                                                 +---------+---------------+---------+-----------+----------+-------+ FV Prox  Full                                                 +---------+---------------+---------+-----------+----------+-------+ FV Mid   Full                                                 +---------+---------------+---------+-----------+----------+-------+ FV  DistalFull                                                 +---------+---------------+---------+-----------+----------+-------+ PFV      Full                                                 +---------+---------------+---------+-----------+----------+-------+ POP      Full           Yes      Yes                          +---------+---------------+---------+-----------+----------+-------+ PTV      Full                                                 +---------+---------------+---------+-----------+----------+-------+ PERO     Full                                                 +---------+---------------+---------+-----------+----------+-------+     Summary: Right: There is no evidence of deep vein thrombosis in the lower extremity. No cystic structure found in the popliteal fossa. Left: There is no evidence of deep vein thrombosis in the lower extremity. No cystic structure found in the popliteal fossa.  *See table(s) above for measurements and observations.    Preliminary         Scheduled Meds: . sodium chloride   Intravenous Once  . amLODipine  5 mg Oral Daily  . atorvastatin  80 mg Oral q1800  . insulin aspart  0-5 Units Subcutaneous QHS  . insulin aspart  0-9 Units Subcutaneous TID WC   Continuous Infusions:   LOS: 1 day    Time spent: 35 minutes.     Elmarie Shiley, MD [facility] Pager [phone]  If 7PM-7AM, please contact night-coverage  [email or web address] Password [email or web address] 10/08/2018, 1:44 PM

## 2018-10-08 NOTE — Progress Notes (Signed)
  Echocardiogram 2D Echocardiogram has been performed.  Bill [name] 10/08/2018, 9:51 AM

## 2018-10-09 LAB — GLUCOSE, CAPILLARY
Glucose-Capillary: 111 mg/dL — ABNORMAL HIGH (ref 70–99)
Glucose-Capillary: 127 mg/dL — ABNORMAL HIGH (ref 70–99)
Glucose-Capillary: 92 mg/dL (ref 70–99)
Glucose-Capillary: 107 mg/dL — ABNORMAL HIGH (ref 70–99)

## 2018-10-09 LAB — RENAL FUNCTION PANEL
Albumin: 2.8 g/dL — ABNORMAL LOW (ref 3.5–5.0)
Anion gap: 9 (ref 5–15)
BUN: 45 mg/dL — ABNORMAL HIGH (ref 6–20)
CO2: 21 mmol/L — ABNORMAL LOW (ref 22–32)
Calcium: 9 mg/dL (ref 8.9–10.3)
Chloride: 110 mmol/L (ref 98–111)
Creatinine, Ser: 3.66 mg/dL — ABNORMAL HIGH (ref 0.61–1.24)
GFR calc Af Amer: 23 mL/min — ABNORMAL LOW (ref >60)
GFR calc non Af Amer: 20 mL/min — ABNORMAL LOW (ref >60)
Glucose, Bld: 119 mg/dL — ABNORMAL HIGH (ref 70–99)
Phosphorus: 5.4 mg/dL — ABNORMAL HIGH (ref 2.5–4.6)
Potassium: 4.2 mmol/L (ref 3.5–5.1)
Sodium: 140 mmol/L (ref 135–145)

## 2018-10-09 LAB — CBC
HCT: 24.6 % — ABNORMAL LOW (ref 39.0–52.0)
Hemoglobin: 7.9 g/dL — ABNORMAL LOW (ref 13.0–17.0)
MCH: 25.6 pg — ABNORMAL LOW (ref 26.0–34.0)
MCHC: 32.1 g/dL (ref 30.0–36.0)
MCV: 79.9 fL — ABNORMAL LOW (ref 80.0–100.0)
Platelets: 219 10*3/uL (ref 150–400)
RBC: 3.08 MIL/uL — ABNORMAL LOW (ref 4.22–5.81)
RDW: 16.5 % — ABNORMAL HIGH (ref 11.5–15.5)
WBC: 5.5 10*3/uL (ref 4.0–10.5)
nRBC: 0 % (ref 0.0–0.2)

## 2018-10-09 LAB — MPO/PR-3 (ANCA) ANTIBODIES
ANCA Proteinase 3: 5.9 U/mL — ABNORMAL HIGH (ref 0.0–3.5)
Myeloperoxidase Abs: <9 U/mL (ref 0.0–9.0)

## 2018-10-09 MED ORDER — FUROSEMIDE 10 MG/ML IJ SOLN
120.0000 mg | Freq: Three times a day (TID) | INTRAVENOUS | Status: DC
  Administered 2018-10-09 – 2018-10-10 (×4): 120 mg via INTRAVENOUS
  Filled 2018-10-09 (×2): qty 10
  Filled 2018-10-09 (×3): qty 12
  Filled 2018-10-09: qty 10

## 2018-10-09 NOTE — Progress Notes (Signed)
Nutrition Consult/Education Note  RD working remotely  [employer] for nutrition education regarding low sodium diet.  Reviewed patient's dietary recall. Provided examples on ways to decrease sodium intake in diet. Discouraged intake of processed foods and use of salt shaker. Encouraged fresh fruits and vegetables as well as whole grain sources of carbohydrates to maximize fiber intake.   Teach back method used. Expect fair compliance.  Body mass index is 28.26 kg/m. Pt meets criteria for Overweight based on current BMI.  Current diet order is Carbohydrate Modified, patient is consuming approximately 100% of meals at this time.   Labs and medications reviewed. No further nutrition interventions warranted at this time.  If additional nutrition issues arise, please re-consult RD.   Arthur Holms, RD, LDN Pager #: [phone] After-Hours Pager #: [phone]  This RD is on site tomorrow, 4/29. Will provide pt "Low Sodium Nutrition Therapy" handout from the Academy of Nutrition and Dietetics.

## 2018-10-09 NOTE — Progress Notes (Signed)
Admit: 10/06/2018 LOS: 2  1M with renal failure (unclear if acute / subacute), > 20y DM2 + DPR, Hypervolemia, anemia Hb 6.2 MCV 80  Subjective:  . RFP stable, K 4.2 . Hb 7.9 . 0.6L UOP reported yesterday; weight up 2kg? Bill [name] He says making much more urine, esp after AM dose today . rec Aranesp and Fereheme yesterday . Still edematous, no other c/o  04/27 0701 - 04/28 0700 In: 481.3 [P.O.:480; IV Piggyback:1.3] Out: 625 [Urine:625]  Filed Weights   10/06/18 1234 10/09/18 0500  Weight: 89.4 kg 91.9 kg    Scheduled Meds: . sodium chloride   Intravenous Once  . amLODipine  5 mg Oral Daily  . atorvastatin  80 mg Oral q1800  . insulin aspart  0-5 Units Subcutaneous QHS  . insulin aspart  0-9 Units Subcutaneous TID WC   Continuous Infusions: . furosemide 120 mg (10/09/18 0558)   PRN Meds:.  Current Labs: reviewed  Pending: ANCA SPEP negative M spike  Ref. Range 10/07/2018 19:17  Kappa, lamda light chain ratio Latest Ref Range: 0.26 - 1.65  1.74 (H)    Ref. Range 10/06/2018 23:36  Saturation Ratios Latest Ref Range: 17.9 - 39.5 % 16 (L)  Ferritin Latest Ref Range: 24 - 336 ng/mL 304     Ref. Range 10/07/2018 19:17  Anti Nuclear Antibody (ANA) Latest Ref Range: Negative  Negative     Ref. Range 10/07/2018 19:17  C3 Complement Latest Ref Range: 82 - 167 mg/dL 128  Complement C4, Body Fluid Latest Ref Range: 14 - 44 mg/dL 39   Physical Exam:  Blood pressure (!) 169/88, pulse 83, temperature 98.1 F (36.7 C), temperature source Oral, resp. rate 16, height 5\' 11"  (1.803 m), weight 91.9 kg, SpO2 95 %. NAD, young male RRR nl s1s2 no rub CTAB 4+ LEE, pitting, to knees NO rashes/lesions Nonfocal, AAO x3; no asterixus  A 1. Progressive Renal Failure over past 40mo, unclear if acute or subacute; sig proteinuria 24h U Protein in process; hematuria present; On ARB at presentation 2. Hypervolemia; high dietary Na intake 3. DM2, hx/o proliferative retinopathy, present for at least  18y 4. ANemia, mild Fe deficiency. No overt losses; 2u pRBC 4/25 5. COVID neg at admission  P . I think certainly has large, or dominant component (or single) diabetic nephropathy; hematuria and proteinuria not surprising . F/u ANCA, other serologies negative; f/u 24h protein collection . Inc lasix to 120 IV TID, need sig net neg fluid balance . RD consult for low Na diet pending . s/p aranesp 155mcg today; fereheme 510mg  IV x1 on 4/28 . Medication Issues; o Preferred narcotic agents for pain control are hydromorphone, fentanyl, and methadone. Morphine should not be used.  o Baclofen should be avoided o Avoid oral sodium phosphate and magnesium citrate based laxatives / bowel preps    Pearson Grippe MD 10/09/2018, 10:46 AM  Recent Labs  Lab 10/07/18 0625 10/08/18 0643 10/09/18 0752  NA 140 141 140  K 5.0 4.4 4.2  CL 111 112* 110  CO2 20* 21* 21*  GLUCOSE 107* 115* 119*  BUN 39* 44* 45*  CREATININE 3.55* 3.65* 3.66*  CALCIUM 8.8* 8.6* 9.0  PHOS  --   --  5.4*   Recent Labs  Lab 10/06/18 1358  10/07/18 0625 10/08/18 0643 10/09/18 0752  WBC 6.3  --  5.9 5.2 5.5  NEUTROABS 4.8  --   --   --   --   HGB 6.2*   < > 8.0*  7.6* 7.9*  HCT 20.5*   < > 25.2* 23.7* 24.6*  MCV 80.4  --  80.0 79.5* 79.9*  PLT 240  --  221 224 219   < > = values in this interval not displayed.

## 2018-10-09 NOTE — Plan of Care (Signed)
Continues iv lasix and strict intact and output monitoring.  BLE 2+edema Problem: Health Behavior/Discharge Planning: Goal: Ability to manage health-related needs will improve Outcome: Progressing   Problem: Clinical Measurements: Goal: Will remain free from infection Outcome: Progressing Goal: Respiratory complications will improve Outcome: Progressing   Problem: Coping: Goal: Level of anxiety will decrease Outcome: Progressing   Problem: Education: Goal: Ability to demonstrate management of disease process will improve Outcome: Progressing   Problem: Cardiac: Goal: Ability to achieve and maintain adequate cardiopulmonary perfusion will improve Outcome: Progressing

## 2018-10-09 NOTE — Progress Notes (Signed)
PROGRESS NOTE    Bill [name]  SEG:[id number] DOB: 1980-08-02 DOA: 10/06/2018 PCP: Patient, No Pcp Per    Brief Narrative: 38 year old male with past medical history significant for nonhemorrhagic stroke, type 2 diabetes, hypertension who initially presented to the urgent care with shortness of breath that is started in night prior to presentation.  He denies fever but he report occasional dry cough.  Patient denies melena hematochezia.  He report worsening lower extremity edema since Wednesday prior to admission.  He also reports orthopnea.  COVID 19 test was sent out from urgent care center.  Pending results at this time. With severe anemia with a hemoglobin at 6, Acute renal failure with a creatinine at 3.5.    Assessment & Plan:   Active Problems:   Acute CVA (cerebrovascular accident) ([place])   Type 2 diabetes mellitus with vascular disease ([place])   Anemia  1-COVID 19 was Rule out; Test Negative times 2, one from [facility] urgent [facility] center.  He presents with cough and SOB.   2-Anemia;  Patient denies any history of melena, hematochezia, hematemesis. Iron level normal at 46, ferritin 737, folic acid 13 vitamin B 500. No evidence of active bleeding. He will need GI evaluation as an outpatient.  Or inpatient if hemoglobin starts to drop again. His anemia could be related to renal disease. He received 2 units PRBC.  Hb today at 7.9. follow trend.  Received IV iron, and aranesp   3-AKI;  Patient present with lower extremity edema, orthopnea, worsening renal function. Creatinine 8 months ago was at 1.1. He presents with creatinine at 3.5. [place] negative for hydronephrosis, chronic medical diseases.  Nephrology has been consulted. UPEP SPEP.:  ANA negative, Kappa light chain 126, Lamda 72,  Complement normal. ANCA 5.9.  Proteinuria.  Continue with IV lasix.   4-HTN; continue with Norvasc.   5-DM type 2; Continue SSI. Hold metformin.   6-LE edema; Doppler negative for  DVT.  ECHO Diastolic dysfunction. I have made out patient referral with cardiology.   7-Acute on chronic diastolic dysfunction. IV lasix.  I made referral for out patient cardiology. [place] [facility]ology will arrange appointment.       Estimated body mass index is 28.26 kg/m as calculated from the following:   Height as of this encounter: 5\' 11"  (1.803 m).   Weight as of this encounter: 91.9 kg.   DVT prophylaxis: SCD no anticoagulation due to anemia.  Code Status: full code.  Family Communication: mother over phone Disposition Plan: remain in the hospital for treatment of renal failure, anemia.   Consultants:   Nephrology    Procedures:      Antimicrobials:   none   Subjective: He is breathing better,. Still with significant LE edema  Objective: Vitals:   10/08/18 1653 10/08/18 2358 10/09/18 0500 10/09/18 0806  BP: (!) 150/85 (!) 168/97  (!) 169/88  Pulse: 83 78  83  Resp:    16  Temp: 98.4 F (36.9 C) 98.2 F (36.8 C)  98.1 F (36.7 C)  TempSrc: Oral Oral  Oral  SpO2: 100% 97%  95%  Weight:   91.9 kg   Height:        Intake/Output Summary (Last 24 hours) at 10/09/2018 1531 Last data filed at 10/09/2018 1400 Gross per 24 hour  Intake 481.27 ml  Output 2025 ml  Net -1543.73 ml   Filed Weights   10/06/18 1234 10/09/18 0500  Weight: 89.4 kg 91.9 kg    Examination:  General exam: NAD  Respiratory system: Crackles bases.  Cardiovascular system: S 1, S 2 RRR Gastrointestinal system: BS present, soft nt Central nervous system: Non focal.  Extremities: Plus 2 edema Skin: No rashes.   Data Reviewed: I have personally reviewed following labs and imaging studies  CBC: Recent Labs  Lab 10/06/18 1358 10/06/18 2336 10/07/18 0625 10/08/18 0643 10/09/18 0752  WBC 6.3  --  5.9 5.2 5.5  NEUTROABS 4.8  --   --   --   --   HGB 6.2* 6.6* 8.0* 7.6* 7.9*  HCT 20.5* 20.6* 25.2* 23.7* 24.6*  MCV 80.4  --  80.0 79.5* 79.9*  PLT 240  --  221 224 627    Basic Metabolic Panel: Recent Labs  Lab 10/06/18 1358 10/06/18 2336 10/07/18 0625 10/08/18 0643 10/09/18 0752  NA 139 141 140 141 140  K 4.6 4.5 5.0 4.4 4.2  CL 111 112* 111 112* 110  CO2 18* 21* 20* 21* 21*  GLUCOSE 136* 142* 107* 115* 119*  BUN 37* 40* 39* 44* 45*  CREATININE 3.55* 3.85* 3.55* 3.65* 3.66*  CALCIUM 8.5* 8.8* 8.8* 8.6* 9.0  PHOS  --   --   --   --  5.4*   GFR: Estimated Creatinine Clearance: 31.4 mL/min (A) (by C-G formula based on SCr of 3.66 mg/dL (H)). Liver Function Tests: Recent Labs  Lab 10/06/18 1358 10/09/18 0752  AST 27  --   ALT 27  --   ALKPHOS 59  --   BILITOT 0.5  --   PROT 7.3  --   ALBUMIN 3.0* 2.8*   No results for input(s): LIPASE, AMYLASE in the last 168 hours. No results for input(s): AMMONIA in the last 168 hours. Coagulation Profile: No results for input(s): INR, PROTIME in the last 168 hours. Cardiac Enzymes: Recent Labs  Lab 10/06/18 1358  TROPONINI <0.03   BNP (last 3 results) No results for input(s): PROBNP in the last 8760 hours. HbA1C: Recent Labs    10/06/18 2336  HGBA1C 6.4*   CBG: Recent Labs  Lab 10/08/18 1142 10/08/18 1652 10/08/18 2153 10/09/18 0804 10/09/18 1204  GLUCAP 143* 114* 131* 111* 127*   Lipid Profile: Recent Labs    10/07/18 0625  CHOL 143  HDL 39*  LDLCALC 94  TRIG 49  CHOLHDL 3.7   Thyroid Function Tests: Recent Labs    10/07/18 0625  TSH 2.867   Anemia Panel: Recent Labs    10/06/18 2336  VITAMINB12 500  FOLATE 13.3  FERRITIN 304  TIBC 280  IRON 46  RETICCTPCT 2.0   Sepsis Labs: No results for input(s): PROCALCITON, LATICACIDVEN in the last 168 hours.  Recent Results (from the past 240 hour(s))  SARS Coronavirus 2 [facility] order, Performed in [facility] hospital lab)     Status: None   Collection Time: 10/07/18  2:32 PM  Result Value Ref Range Status   SARS Coronavirus 2 NEGATIVE NEGATIVE Final    Comment: (NOTE) If result is NEGATIVE SARS-CoV-2 target  nucleic acids are NOT DETECTED. The SARS-CoV-2 RNA is generally detectable in upper and lower  respiratory specimens during the acute phase of infection. The lowest  concentration of SARS-CoV-2 viral copies this assay can detect is 250  copies / mL. A negative result does not preclude SARS-CoV-2 infection  and should not be used as the sole basis for treatment or other  patient management decisions.  A negative result may occur with  improper specimen collection / handling, submission of specimen other  than nasopharyngeal swab, presence of viral mutation(s) within the  areas targeted by this assay, and inadequate number of viral copies  (<250 copies / mL). A negative result must be combined with clinical  observations, patient history, and epidemiological information. If result is POSITIVE SARS-CoV-2 target nucleic acids are DETECTED. The SARS-CoV-2 RNA is generally detectable in upper and lower  respiratory specimens dur ing the acute phase of infection.  Positive  results are indicative of active infection with SARS-CoV-2.  Clinical  correlation with patient history and other diagnostic information is  necessary to determine patient infection status.  Positive results do  not rule out bacterial infection or co-infection with other viruses. If result is PRESUMPTIVE POSTIVE SARS-CoV-2 nucleic acids MAY BE PRESENT.   A presumptive positive result was obtained on the submitted specimen  and confirmed on repeat testing.  While 2019 novel coronavirus  (SARS-CoV-2) nucleic acids may be present in the submitted sample  additional confirmatory testing may be necessary for epidemiological  and / or clinical management purposes  to differentiate between  SARS-CoV-2 and other Sarbecovirus currently known to infect humans.  If clinically indicated additional testing with an alternate test  methodology [phone]) is advised. The SARS-CoV-2 RNA is generally  detectable in upper and lower  respiratory sp ecimens during the acute  phase of infection. The expected result is Negative. Fact Sheet for Patients:  StrictlyIdeas.no Fact Sheet for Healthcare Providers: BankingDealers.co.za This test is not yet approved or cleared by the [place] FDA and has been authorized for detection and/or diagnosis of SARS-CoV-2 by FDA under an Emergency Use Authorization (EUA).  This EUA will remain in effect (meaning this test can be used) for the duration of the COVID-19 declaration under Section 564(b)(1) of the Act, 21 U.S.C. section 360bbb-3(b)(1), unless the authorization is terminated or revoked sooner. Performed at [facility], [place] [address], [place], [place] [address]          Radiology Studies: US Renal  Result Date: 10/07/2018 CLINICAL DATA:  Initial evaluation for acute renal injury. EXAM: RENAL / URINARY TRACT ULTRASOUND COMPLETE COMPARISON:  None available. FINDINGS: Right Kidney: Renal measurements: 11.6 x 5.7 x 7.3 cm = volume: 247.4 mL. Increased echogenicity seen within the renal parenchyma. No mass or hydronephrosis visualized. Left Kidney: Renal measurements: 11.7 x 7.6 x 6.2 cm = volume: 286.8 mL. Increased echogenicity seen within the renal parenchyma. No mass or hydronephrosis visualized. Bladder: Appears normal for degree of bladder distention. Bilateral pleural effusions noted. IMPRESSION: 1. Increased echogenicity within the renal parenchyma bilaterally, compatible with medical renal disease. No hydronephrosis. 2. Bilateral pleural effusions. Electronically Signed   By: Jeannine Boga M.D.   On: 10/07/2018 20:00   Vas [place] Lower Extremity Venous (dvt)  Result Date: 10/08/2018  Lower Venous Study Indications: Swelling.  Performing Technologist: Oliver Hum RVT  Examination Guidelines: A complete evaluation includes B-mode imaging, spectral Doppler, color Doppler, and power Doppler as needed of all  accessible portions of each vessel. Bilateral testing is considered an integral part of a complete examination. Limited examinations for reoccurring indications may be performed as noted.  +---------+---------------+---------+-----------+----------+-------+ RIGHT    CompressibilityPhasicitySpontaneityPropertiesSummary +---------+---------------+---------+-----------+----------+-------+ CFV      Full           Yes      Yes                          +---------+---------------+---------+-----------+----------+-------+ SFJ      Full                                                 +---------+---------------+---------+-----------+----------+-------+  FV Prox  Full                                                 +---------+---------------+---------+-----------+----------+-------+ FV Mid   Full                                                 +---------+---------------+---------+-----------+----------+-------+ FV DistalFull                                                 +---------+---------------+---------+-----------+----------+-------+ PFV      Full                                                 +---------+---------------+---------+-----------+----------+-------+ POP      Full           Yes      Yes                          +---------+---------------+---------+-----------+----------+-------+ PTV      Full                                                 +---------+---------------+---------+-----------+----------+-------+ PERO     Full                                                 +---------+---------------+---------+-----------+----------+-------+   +---------+---------------+---------+-----------+----------+-------+ LEFT     CompressibilityPhasicitySpontaneityPropertiesSummary +---------+---------------+---------+-----------+----------+-------+ CFV      Full           Yes      Yes                           +---------+---------------+---------+-----------+----------+-------+ SFJ      Full                                                 +---------+---------------+---------+-----------+----------+-------+ FV Prox  Full                                                 +---------+---------------+---------+-----------+----------+-------+ FV Mid   Full                                                 +---------+---------------+---------+-----------+----------+-------+ FV  DistalFull                                                 +---------+---------------+---------+-----------+----------+-------+ PFV      Full                                                 +---------+---------------+---------+-----------+----------+-------+ POP      Full           Yes      Yes                          +---------+---------------+---------+-----------+----------+-------+ PTV      Full                                                 +---------+---------------+---------+-----------+----------+-------+ PERO     Full                                                 +---------+---------------+---------+-----------+----------+-------+     Summary: Right: There is no evidence of deep vein thrombosis in the lower extremity. No cystic structure found in the popliteal fossa. Left: There is no evidence of deep vein thrombosis in the lower extremity. No cystic structure found in the popliteal fossa.  *See table(s) above for measurements and observations. Electronically signed by Ruta Hinds MD on 10/08/2018 at 5:53:38 PM.    Final         Scheduled Meds: . sodium chloride   Intravenous Once  . amLODipine  5 mg Oral Daily  . atorvastatin  80 mg Oral q1800  . insulin aspart  0-5 Units Subcutaneous QHS  . insulin aspart  0-9 Units Subcutaneous TID WC   Continuous Infusions: . furosemide 120 mg (10/09/18 1359)     LOS: 2 days    Time spent: 35 minutes.     Elmarie Shiley, MD  [facility] Pager [phone]  If 7PM-7AM, please contact night-coverage [email or web address] Password [facility] 10/09/2018, 3:31 PM

## 2018-10-10 LAB — APTT: aPTT: 36 seconds (ref 24–36)

## 2018-10-10 LAB — RENAL FUNCTION PANEL
Albumin: 3 g/dL — ABNORMAL LOW (ref 3.5–5.0)
Anion gap: 11 (ref 5–15)
BUN: 55 mg/dL — ABNORMAL HIGH (ref 6–20)
CO2: 25 mmol/L (ref 22–32)
Calcium: 9 mg/dL (ref 8.9–10.3)
Chloride: 102 mmol/L (ref 98–111)
Creatinine, Ser: 4.42 mg/dL — ABNORMAL HIGH (ref 0.61–1.24)
GFR calc Af Amer: 18 mL/min — ABNORMAL LOW (ref >60)
GFR calc non Af Amer: 16 mL/min — ABNORMAL LOW (ref >60)
Glucose, Bld: 149 mg/dL — ABNORMAL HIGH (ref 70–99)
Phosphorus: 5.6 mg/dL — ABNORMAL HIGH (ref 2.5–4.6)
Potassium: 4.1 mmol/L (ref 3.5–5.1)
Sodium: 138 mmol/L (ref 135–145)

## 2018-10-10 LAB — GLUCOSE, CAPILLARY
Glucose-Capillary: 111 mg/dL — ABNORMAL HIGH (ref 70–99)
Glucose-Capillary: 110 mg/dL — ABNORMAL HIGH (ref 70–99)
Glucose-Capillary: 160 mg/dL — ABNORMAL HIGH (ref 70–99)
Glucose-Capillary: 166 mg/dL — ABNORMAL HIGH (ref 70–99)

## 2018-10-10 LAB — CBC
HCT: 26 % — ABNORMAL LOW (ref 39.0–52.0)
Hemoglobin: 8.4 g/dL — ABNORMAL LOW (ref 13.0–17.0)
MCH: 25.4 pg — ABNORMAL LOW (ref 26.0–34.0)
MCHC: 32.3 g/dL (ref 30.0–36.0)
MCV: 78.5 fL — ABNORMAL LOW (ref 80.0–100.0)
Platelets: 250 10*3/uL (ref 150–400)
RBC: 3.31 MIL/uL — ABNORMAL LOW (ref 4.22–5.81)
RDW: 16.4 % — ABNORMAL HIGH (ref 11.5–15.5)
WBC: 5.2 10*3/uL (ref 4.0–10.5)
nRBC: 0 % (ref 0.0–0.2)

## 2018-10-10 LAB — PROTIME-INR
INR: 1.1 (ref 0.8–1.2)
Prothrombin Time: 14.3 seconds (ref 11.4–15.2)

## 2018-10-10 MED ORDER — FUROSEMIDE 10 MG/ML IJ SOLN
120.0000 mg | Freq: Two times a day (BID) | INTRAVENOUS | Status: DC
  Administered 2018-10-11: 120 mg via INTRAVENOUS
  Filled 2018-10-10: qty 10
  Filled 2018-10-10 (×2): qty 12

## 2018-10-10 NOTE — Progress Notes (Signed)
IR requested by Dr. Joelyn Oms for possible image-guided random renal biopsy.  Plan for procedure 10/11/2018 with Dr. Anselm Pancoast. Patient will be NPO at midnight. INR pending. Will consent in IR tomorrow prior to procedure.  Please call IR with questions/concerns.  Bea Graff Louk, PA-C 10/10/2018, 3:08 PM

## 2018-10-10 NOTE — Progress Notes (Signed)
Admit: 10/06/2018 LOS: 3  38M with renal failure (unclear if acute / subacute), > 20y DM2 + DPR, Hypervolemia, anemia Hb 6.2 MCV 80  Subjective:  . No AM Labs yet . PR3 ANCA weakly positive, neg 2V CXR, no petchiae/purpura . 4.5L UOP yesterday, 3.1L net neg . Still edematous but improved, no other c/o  04/28 0701 - 04/29 0700 In: 8315 [P.O.:1300; IV Piggyback:62] Out: 4500 [Urine:4500]  Filed Weights   10/06/18 1234 10/09/18 0500 10/10/18 0413  Weight: 89.4 kg 91.9 kg 90.4 kg    Scheduled Meds: . sodium chloride   Intravenous Once  . amLODipine  5 mg Oral Daily  . atorvastatin  80 mg Oral q1800  . insulin aspart  0-5 Units Subcutaneous QHS  . insulin aspart  0-9 Units Subcutaneous TID WC   Continuous Infusions: . furosemide 120 mg (10/10/18 1345)   PRN Meds:.  Current Labs: reviewed  Results for Bill [name], Bill [name] (MRN [id number]) as of 10/10/2018 13:40  Ref. Range 10/07/2018 19:17  ANCA Proteinase 3 Latest Ref Range: 0.0 - 3.5 U/mL 5.9 (H)  Myeloperoxidase Abs Latest Ref Range: 0.0 - 9.0 U/mL <9.0   SPEP negative M spike  Ref. Range 10/07/2018 19:17  Kappa, lamda light chain ratio Latest Ref Range: 0.26 - 1.65  1.74 (H)    Ref. Range 10/06/2018 23:36  Saturation Ratios Latest Ref Range: 17.9 - 39.5 % 16 (L)  Ferritin Latest Ref Range: 24 - 336 ng/mL 304     Ref. Range 10/07/2018 19:17  Anti Nuclear Antibody (ANA) Latest Ref Range: Negative  Negative     Ref. Range 10/07/2018 19:17  C3 Complement Latest Ref Range: 82 - 167 mg/dL 128  Complement C4, Body Fluid Latest Ref Range: 14 - 44 mg/dL 39   Physical Exam:  Blood pressure (!) 156/92, pulse 84, temperature 98.2 F (36.8 C), temperature source Oral, resp. rate 17, height 5\' 11"  (1.803 m), weight 90.4 kg, SpO2 100 %. NAD, young male RRR nl s1s2 no rub CTAB 3+ LEE, pitting, to knees NO rashes/lesions Nonfocal, AAO x3; no asterixus  A 1. Progressive Renal Failure over past 16mo, unclear if acute or subacute; sig  proteinuria 24h U Protein in process; hematuria present; On ARB at presentation 2. + PR3 ANCA, unclear if clinically relevant 3. Hypervolemia; high dietary Na intake; improving, responding to diuretics 4. DM2, hx/o proliferative retinopathy, present for at least 18y 5. ANemia, mild Fe deficiency. No overt losses; 2u pRBC 4/25; s/p ESA and [facility] 4/27 6. COVID neg at admission  P . 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 Diuresing well, cont TID lasix . Labs today . If Hb is < 8 would give 1u PRBC to prepare for renal biopsy . [facility] Biopsy Request form is in paper chart . Medication Issues; o Preferred narcotic agents for pain control are hydromorphone, fentanyl, and methadone. Morphine should not be used.  o Baclofen should be avoided o Avoid oral sodium phosphate and magnesium citrate based laxatives / bowel preps    Pearson Grippe MD 10/10/2018, 1:45 PM  Recent Labs  Lab 10/07/18 0625 10/08/18 0643 10/09/18 0752  NA 140 141 140  K 5.0 4.4 4.2  CL 111 112* 110  CO2 20* 21* 21*  GLUCOSE 107* 115* 119*  BUN 39* 44* 45*  CREATININE 3.55* 3.65* 3.66*  CALCIUM 8.8* 8.6* 9.0  PHOS  --   --  5.4*   Recent Labs  Lab 10/06/18 1358  10/07/18 0625 10/08/18 0643 10/09/18 0752  WBC 6.3  --  5.9 5.2 5.5  NEUTROABS 4.8  --   --   --   --   HGB 6.2*   < > 8.0* 7.6* 7.9*  HCT 20.5*   < > 25.2* 23.7* 24.6*  MCV 80.4  --  80.0 79.5* 79.9*  PLT 240  --  221 224 219   < > = values in this interval not displayed.

## 2018-10-10 NOTE — Progress Notes (Signed)
PROGRESS NOTE    Bill [name]  HKV:[id number] DOB: [birth year] DOA: 10/06/2018 PCP: Bill [name], No Pcp Per    Brief Narrative: 38 year old male with past medical history significant for nonhemorrhagic stroke, type 2 diabetes, hypertension who initially presented to the urgent care with shortness of breath that is started in night prior to presentation.  He denies fever but he report occasional dry cough.  Bill [name] denies melena hematochezia.  He report worsening lower extremity edema since Wednesday prior to admission.  He also reports orthopnea.  COVID 19 test was sent out from urgent care center.  Pending results at this time. With severe anemia with a hemoglobin at 6, Acute renal failure with a creatinine at 3.5.    Assessment & Plan:   Active Problems:   Acute CVA (cerebrovascular accident) ([place])   Type 2 diabetes mellitus with vascular disease ([place])   Anemia  1) dyspnea --- most likely due to fluid overload rather than pulmonary infection, COVID 19 was Rule out; Test Negative times 2, one from [facility] urgent care center.    2-Anemia;  Bill [name] denies any history of melena, hematochezia, hematemesis. Iron level normal at 46, ferritin 564, folic acid 13 vitamin B 500. No evidence of active bleeding. He will need GI evaluation as an outpatient.  Or inpatient if hemoglobin starts to drop again. His anemia could be related to renal disease. He received 2 units PRBC.  Hb today at 7.9. follow trend.  Received IV iron, and aranesp   3-AKI Vs acute/CKD----longstanding history of diabetes and hypertension, PR3 ANCA weakly positive at 5.9, ANA negative, scheduled for renal biopsy by IR on 10/11/2018 Bill [name] present with lower extremity edema, orthopnea, worsening renal function. Creatinine 8 months ago was at 1.1. He presents with creatinine at 3.5. [place] negative for hydronephrosis, chronic medical diseases.  Nephrology has been consulted. UPEP SPEP.:  Kappa light chain 126, Lamda 72,   Complement normal.  Proteinuria.  Continue with IV lasix.   4-HTN; continue with Norvasc.   5-DM type 2; Continue SSI. Hold metformin.   6-LE edema; Doppler negative for DVT.  ECHO Diastolic dysfunction. I have made out Bill [name] referral with cardiology.   7-Acute on chronic diastolic dysfunction. IV lasix.  I made referral for out Bill [name] cardiology. [place] [facility] will arrange appointment.    DVT prophylaxis: SCD no anticoagulation due to anemia.  Code Status: full code.  Family Communication: mother over phone Disposition Plan: remain in the hospital for treatment of renal failure, anemia-renal biopsy on 10/11/2018  Consultants:   Nephrology  IR renal biopsy  Procedures:   IR plans for renal biopsy on 10/11/2018   Antimicrobials:   none   Subjective: Voiding well, dyspnea on exertion persist, no shortness of breath at rest,   Objective: Vitals:   10/09/18 2236 10/10/18 0413 10/10/18 0722 10/10/18 1731  BP: 127/80 (!) 146/77 (!) 156/92 (!) 142/85  Pulse: 85 80 84 84  Resp: 16 17    Temp:  98.1 F (36.7 C) 98.2 F (36.8 C) 98.5 F (36.9 C)  TempSrc:  Oral Oral Oral  SpO2: 100% 100% 100% 100%  Weight:  90.4 kg    Height:        Intake/Output Summary (Last 24 hours) at 10/10/2018 1802 Last data filed at 10/10/2018 1346 Gross per 24 hour  Intake 720 ml  Output 3200 ml  Net -2480 ml   Filed Weights   10/06/18 1234 10/09/18 0500 10/10/18 0413  Weight: 89.4 kg 91.9 kg 90.4 kg  Examination:  General exam: NAD Respiratory system: Crackles bases.  Cardiovascular system: S 1, S 2 RRR Gastrointestinal system: BS present, soft nt Central nervous system: Non focal.  Extremities: Plus 2 edema Skin: No rashes.   CBC: Recent Labs  Lab 10/06/18 1358 10/06/18 2336 10/07/18 0625 10/08/18 0643 10/09/18 0752 10/10/18 1432  WBC 6.3  --  5.9 5.2 5.5 5.2  NEUTROABS 4.8  --   --   --   --   --   HGB 6.2* 6.6* 8.0* 7.6* 7.9* 8.4*  HCT 20.5* 20.6*  25.2* 23.7* 24.6* 26.0*  MCV 80.4  --  80.0 79.5* 79.9* 78.5*  PLT 240  --  221 224 219 932   Basic Metabolic Panel: Recent Labs  Lab 10/06/18 2336 10/07/18 0625 10/08/18 0643 10/09/18 0752 10/10/18 1432  NA 141 140 141 140 138  K 4.5 5.0 4.4 4.2 4.1  CL 112* 111 112* 110 102  CO2 21* 20* 21* 21* 25  GLUCOSE 142* 107* 115* 119* 149*  BUN 40* 39* 44* 45* 55*  CREATININE 3.85* 3.55* 3.65* 3.66* 4.42*  CALCIUM 8.8* 8.8* 8.6* 9.0 9.0  PHOS  --   --   --  5.4* 5.6*   GFR: Estimated Creatinine Clearance: 25.8 mL/min (A) (by C-G formula based on SCr of 4.42 mg/dL (H)). Liver Function Tests: Recent Labs  Lab 10/06/18 1358 10/09/18 0752 10/10/18 1432  AST 27  --   --   ALT 27  --   --   ALKPHOS 59  --   --   BILITOT 0.5  --   --   PROT 7.3  --   --   ALBUMIN 3.0* 2.8* 3.0*   No results for input(s): LIPASE, AMYLASE in the last 168 hours. No results for input(s): AMMONIA in the last 168 hours. Coagulation Profile: Recent Labs  Lab 10/10/18 1432  INR 1.1   Cardiac Enzymes: Recent Labs  Lab 10/06/18 1358  TROPONINI <0.03   BNP (last 3 results) No results for input(s): PROBNP in the last 8760 hours. HbA1C: No results for input(s): HGBA1C in the last 72 hours. CBG: Recent Labs  Lab 10/09/18 1557 10/09/18 2123 10/10/18 0723 10/10/18 1138 10/10/18 1732  GLUCAP 92 107* 111* 110* 160*   Lipid Profile: No results for input(s): CHOL, HDL, LDLCALC, TRIG, CHOLHDL, LDLDIRECT in the last 72 hours. Thyroid Function Tests: No results for input(s): TSH, T4TOTAL, FREET4, T3FREE, THYROIDAB in the last 72 hours. Anemia Panel: No results for input(s): VITAMINB12, FOLATE, FERRITIN, TIBC, IRON, RETICCTPCT in the last 72 hours. Sepsis Labs: No results for input(s): PROCALCITON, LATICACIDVEN in the last 168 hours.  Recent Results (from the past 240 hour(s))  SARS Coronavirus 2 [facility] order, Performed in [facility] hospital lab)     Status: None   Collection Time: 10/07/18   2:32 PM  Result Value Ref Range Status   SARS Coronavirus 2 NEGATIVE NEGATIVE Final    Comment: (NOTE) If result is NEGATIVE SARS-CoV-2 target nucleic acids are NOT DETECTED. The SARS-CoV-2 RNA is generally detectable in upper and lower  respiratory specimens during the acute phase of infection. The lowest  concentration of SARS-CoV-2 viral copies this assay can detect is 250  copies / mL. A negative result does not preclude SARS-CoV-2 infection  and should not be used as the sole basis for treatment or other  Bill [name] management decisions.  A negative result may occur with  improper specimen collection / handling, submission of specimen other  than nasopharyngeal  swab, presence of viral mutation(s) within the  areas targeted by this assay, and inadequate number of viral copies  (<250 copies / mL). A negative result must be combined with clinical  observations, Bill [name] history, and epidemiological information. If result is POSITIVE SARS-CoV-2 target nucleic acids are DETECTED. The SARS-CoV-2 RNA is generally detectable in upper and lower  respiratory specimens dur ing the acute phase of infection.  Positive  results are indicative of active infection with SARS-CoV-2.  Clinical  correlation with Bill [name] history and other diagnostic information is  necessary to determine Bill [name] infection status.  Positive results do  not rule out bacterial infection or co-infection with other viruses. If result is PRESUMPTIVE POSTIVE SARS-CoV-2 nucleic acids MAY BE PRESENT.   A presumptive positive result was obtained on the submitted specimen  and confirmed on repeat testing.  While 2019 novel coronavirus  (SARS-CoV-2) nucleic acids may be present in the submitted sample  additional confirmatory testing may be necessary for epidemiological  and / or clinical management purposes  to differentiate between  SARS-CoV-2 and other Sarbecovirus currently known to infect humans.  If clinically indicated  additional testing with an alternate test  methodology [phone]) is advised. The SARS-CoV-2 RNA is generally  detectable in upper and lower respiratory sp ecimens during the acute  phase of infection. The expected result is Negative. Fact Sheet for Patients:  StrictlyIdeas.no Fact Sheet for Healthcare Providers: BankingDealers.co.za This test is not yet approved or cleared by the [place] FDA and has been authorized for detection and/or diagnosis of SARS-CoV-2 by FDA under an Emergency Use Authorization (EUA).  This EUA will remain in effect (meaning this test can be used) for the duration of the COVID-19 declaration under Section 564(b)(1) of the Act, 21 U.S.C. section 360bbb-3(b)(1), unless the authorization is terminated or revoked sooner. Performed at [facility], [place] [address] [place], [place] [address]          Radiology Studies: No results found.      Scheduled Meds: . sodium chloride   Intravenous Once  . amLODipine  5 mg Oral Daily  . atorvastatin  80 mg Oral q1800  . insulin aspart  0-5 Units Subcutaneous QHS  . insulin aspart  0-9 Units Subcutaneous TID WC   Continuous Infusions: . [START ON 10/11/2018] furosemide       LOS: 3 days     Roxan Hockey, MD [facility]  If 7PM-7AM, please contact night-coverage [email or web address] Password [facility] 10/10/2018, 6:02 PM

## 2018-10-11 ENCOUNTER — Inpatient Hospital Stay (HOSPITAL_COMMUNITY): Payer: 59

## 2018-10-11 ENCOUNTER — Other Ambulatory Visit (HOSPITAL_COMMUNITY): Payer: 59

## 2018-10-11 DIAGNOSIS — D631 Anemia in chronic kidney disease: Secondary | ICD-10-CM

## 2018-10-11 DIAGNOSIS — I639 Cerebral infarction, unspecified: Secondary | ICD-10-CM

## 2018-10-11 DIAGNOSIS — N189 Chronic kidney disease, unspecified: Secondary | ICD-10-CM

## 2018-10-11 LAB — PROTEIN / CREATININE RATIO, URINE
Creatinine, Urine: 73.25 mg/dL
Protein Creatinine Ratio: 1.83 mg/mg{Cre} — ABNORMAL HIGH (ref 0.00–0.15)
Total Protein, Urine: 134 mg/dL

## 2018-10-11 LAB — GLUCOSE, CAPILLARY
Glucose-Capillary: 180 mg/dL — ABNORMAL HIGH (ref 70–99)
Glucose-Capillary: 100 mg/dL — ABNORMAL HIGH (ref 70–99)
Glucose-Capillary: 140 mg/dL — ABNORMAL HIGH (ref 70–99)

## 2018-10-11 LAB — CBC
HCT: 25.5 % — ABNORMAL LOW (ref 39.0–52.0)
HCT: 21.6 % — ABNORMAL LOW (ref 39.0–52.0)
HCT: 21.4 % — ABNORMAL LOW (ref 39.0–52.0)
Hemoglobin: 8.2 g/dL — ABNORMAL LOW (ref 13.0–17.0)
Hemoglobin: 6.9 g/dL — CL (ref 13.0–17.0)
Hemoglobin: 6.9 g/dL — CL (ref 13.0–17.0)
MCH: 25.5 pg — ABNORMAL LOW (ref 26.0–34.0)
MCH: 25.6 pg — ABNORMAL LOW (ref 26.0–34.0)
MCH: 25.7 pg — ABNORMAL LOW (ref 26.0–34.0)
MCHC: 32.2 g/dL (ref 30.0–36.0)
MCHC: 31.9 g/dL (ref 30.0–36.0)
MCHC: 32.2 g/dL (ref 30.0–36.0)
MCV: 79.4 fL — ABNORMAL LOW (ref 80.0–100.0)
MCV: 80 fL (ref 80.0–100.0)
MCV: 79.6 fL — ABNORMAL LOW (ref 80.0–100.0)
Platelets: 243 10*3/uL (ref 150–400)
Platelets: 221 10*3/uL (ref 150–400)
Platelets: 235 10*3/uL (ref 150–400)
RBC: 3.21 MIL/uL — ABNORMAL LOW (ref 4.22–5.81)
RBC: 2.7 MIL/uL — ABNORMAL LOW (ref 4.22–5.81)
RBC: 2.69 MIL/uL — ABNORMAL LOW (ref 4.22–5.81)
RDW: 16.6 % — ABNORMAL HIGH (ref 11.5–15.5)
RDW: 16.5 % — ABNORMAL HIGH (ref 11.5–15.5)
RDW: 16.5 % — ABNORMAL HIGH (ref 11.5–15.5)
WBC: 6.1 10*3/uL (ref 4.0–10.5)
WBC: 6 10*3/uL (ref 4.0–10.5)
WBC: 7.5 10*3/uL (ref 4.0–10.5)
nRBC: 0 % (ref 0.0–0.2)
nRBC: 0 % (ref 0.0–0.2)
nRBC: 0 % (ref 0.0–0.2)

## 2018-10-11 LAB — BASIC METABOLIC PANEL
Anion gap: 11 (ref 5–15)
BUN: 58 mg/dL — ABNORMAL HIGH (ref 6–20)
CO2: 25 mmol/L (ref 22–32)
Calcium: 9.1 mg/dL (ref 8.9–10.3)
Chloride: 103 mmol/L (ref 98–111)
Creatinine, Ser: 4.65 mg/dL — ABNORMAL HIGH (ref 0.61–1.24)
GFR calc Af Amer: 17 mL/min — ABNORMAL LOW (ref >60)
GFR calc non Af Amer: 15 mL/min — ABNORMAL LOW (ref >60)
Glucose, Bld: 118 mg/dL — ABNORMAL HIGH (ref 70–99)
Potassium: 4.3 mmol/L (ref 3.5–5.1)
Sodium: 139 mmol/L (ref 135–145)

## 2018-10-11 MED ORDER — GELATIN ABSORBABLE 12-7 MM EX MISC
CUTANEOUS | Status: AC
  Administered 2018-10-11: 09:00:00
  Filled 2018-10-11: qty 1

## 2018-10-11 MED ORDER — FENTANYL CITRATE (PF) 100 MCG/2ML IJ SOLN
INTRAMUSCULAR | Status: AC | PRN
  Administered 2018-10-11: 25 ug via INTRAVENOUS
  Administered 2018-10-11: 50 ug via INTRAVENOUS
  Administered 2018-10-11: 25 ug via INTRAVENOUS

## 2018-10-11 MED ORDER — FENTANYL CITRATE (PF) 100 MCG/2ML IJ SOLN
INTRAMUSCULAR | Status: AC
  Filled 2018-10-11: qty 2

## 2018-10-11 MED ORDER — LIDOCAINE HCL (PF) 1 % IJ SOLN
INTRAMUSCULAR | Status: AC
  Administered 2018-10-11: 08:00:00
  Filled 2018-10-11: qty 5

## 2018-10-11 MED ORDER — MIDAZOLAM HCL 2 MG/2ML IJ SOLN
INTRAMUSCULAR | Status: AC | PRN
  Administered 2018-10-11: 0.5 mg via INTRAVENOUS
  Administered 2018-10-11: 1 mg via INTRAVENOUS

## 2018-10-11 MED ORDER — MIDAZOLAM HCL 2 MG/2ML IJ SOLN
INTRAMUSCULAR | Status: AC
  Filled 2018-10-11: qty 2

## 2018-10-11 NOTE — Progress Notes (Signed)
PROGRESS NOTE    Bill [name]  GYI:[id number] DOB: [birth year] DOA: 10/06/2018 PCP: Patient, No Pcp Per    Brief Narrative: 38 year old male with past medical history significant for nonhemorrhagic stroke, type 2 diabetes, hypertension who initially presented to the urgent care with shortness of breath that is started in night prior to presentation.  He denies fever but he report occasional dry cough.  Patient denies melena hematochezia.  He report worsening lower extremity edema since Wednesday prior to admission.  He also reports orthopnea.  COVID 19 test was sent out from urgent care center.  Pending results at this time. With severe anemia with a hemoglobin at 6, Acute renal failure with a creatinine at 3.5. Status post right kidney biopsy with postbiopsy hematoma   Assessment & Plan:   Active Problems:   Acute CVA (cerebrovascular accident) ([place])   Type 2 diabetes mellitus with vascular disease ([place])   Anemia  1)Perinephric Hematoma---- s/p Renal biopsy on 3/50/09 complicated by perinephric hematoma requiring Gelfoam injection, hemoglobin is down to 6.9 from 8.2, check serial H&H and transfuse as clinically indicated, repeat renal ultrasound without evidence of active bleeding discussed with IR attending Dr. Markus Daft and Nephrologist Dr. Joelyn Oms  2)Dyspnea --- most likely due to fluid overload rather than pulmonary infection, COVID 19 was Rule out; Test Negative X 2, one from [facility] urgent care center.    3)Acute on chronic Anemia----underlying anemia of CKD compounded by acute blood loss secondary to right-sided perinephric hematoma after kidney biopsy on 10/11/2018, serial H&H, transfuse as indicated, see #1 above, previously received 2 units of packed cells, previously received IV iron and Aranesp, Iron level normal at 46, ferritin 381, folic acid 13 vitamin B 500. Patient denies any history of melena, hematochezia, hematemesis.  4)AKI Vs acute/CKD----longstanding history of  diabetes and hypertension, PR3 ANCA weakly positive at 5.9, ANA negative, s/p renal biopsy by IR on 10/11/2018 Creatinine 8 months ago was at 1.1. He presents with creatinine at 3.5, creatinine continues to trend up [place] negative for hydronephrosis, shows chronic medical diseases.  Nephrology consult appreciated  UPEP SPEP.:  Kappa light chain 126, Lamda 72,  Complement normal.  Proteinuria.  Hold IV lasix.   4-HTN-  hold Norvasc due to perinephric hematoma and risk for hemodynamic instability.  5-DM type 2; Continue SSI. Hold metformin.   6-LE edema; Doppler negative for DVT.  ECHO Diastolic dysfunction.  out patient referral with cardiology.   7-Acute on chronic diastolic dysfunction. Hold IV lasix.   referral for out patient cardiology. [place] [facility] will arrange appointment.    DVT prophylaxis: SCD no anticoagulation due to anemia.  Code Status: full code.  Family Communication: mother over phone Disposition Plan: remain in the hospital for treatment of renal failure, anemia-renal perinephric hematoma after Renal biopsy on 10/11/2018  Consultants:   Nephrology  IR renal biopsy  Procedures:   Rt renal biopsy on 10/11/2018 by IR   Antimicrobials:   none   Subjective: Resting comfortably after right kidney biopsy, no chest pains no palpitations no dizziness  Objective: Vitals:   10/11/18 1128 10/11/18 1204 10/11/18 1254 10/11/18 1408  BP: 114/64 131/78 (!) 126/93 128/80  Pulse:  80  94  Resp: 16 16 16 16   Temp:      TempSrc:      SpO2: 100% 100% 100% 100%  Weight:      Height:        Intake/Output Summary (Last 24 hours) at 10/11/2018 1417 Last data filed at 10/11/2018  0625 Gross per 24 hour  Intake 62 ml  Output 700 ml  Net -638 ml   Filed Weights   10/09/18 0500 10/10/18 0413 10/11/18 0600  Weight: 91.9 kg 90.4 kg 86.9 kg    Examination:   Physical Exam  Patient is examined daily including today on 10/11/18, exams remain the same as of  yesterday except that has changed   Gen:- Awake Alert,  In no apparent distress  HEENT:- [employer].AT, No sclera icterus Neck-Supple Neck,No JVD,.  Lungs-diminished in bases with fine bibasilar rales CV- S1, S2 normal Abd-  +ve B.Sounds, Abd Soft, No tenderness,    Extremity/Skin:-2+ pitting edema, good pedal pulses Psych-affect is appropriate, oriented x3 Neuro-no new focal deficits, no tremors MSK--- right lower back with dressing over right kidney biopsy site  CBC: Recent Labs  Lab 10/06/18 1358  10/08/18 0643 10/09/18 0752 10/10/18 1432 10/11/18 0501 10/11/18 1140  WBC 6.3   < > 5.2 5.5 5.2 6.1 6.0  NEUTROABS 4.8  --   --   --   --   --   --   HGB 6.2*   < > 7.6* 7.9* 8.4* 8.2* 6.9*  HCT 20.5*   < > 23.7* 24.6* 26.0* 25.5* 21.6*  MCV 80.4   < > 79.5* 79.9* 78.5* 79.4* 80.0  PLT 240   < > 224 219 250 243 221   < > = values in this interval not displayed.   Basic Metabolic Panel: Recent Labs  Lab 10/07/18 0625 10/08/18 0643 10/09/18 0752 10/10/18 1432 10/11/18 0501  NA 140 141 140 138 139  K 5.0 4.4 4.2 4.1 4.3  CL 111 112* 110 102 103  CO2 20* 21* 21* 25 25  GLUCOSE 107* 115* 119* 149* 118*  BUN 39* 44* 45* 55* 58*  CREATININE 3.55* 3.65* 3.66* 4.42* 4.65*  CALCIUM 8.8* 8.6* 9.0 9.0 9.1  PHOS  --   --  5.4* 5.6*  --    GFR: Estimated Creatinine Clearance: 22.7 mL/min (A) (by C-G formula based on SCr of 4.65 mg/dL (H)). Liver Function Tests: Recent Labs  Lab 10/06/18 1358 10/09/18 0752 10/10/18 1432  AST 27  --   --   ALT 27  --   --   ALKPHOS 59  --   --   BILITOT 0.5  --   --   PROT 7.3  --   --   ALBUMIN 3.0* 2.8* 3.0*   No results for input(s): LIPASE, AMYLASE in the last 168 hours. No results for input(s): AMMONIA in the last 168 hours. Coagulation Profile: Recent Labs  Lab 10/10/18 1432  INR 1.1   Cardiac Enzymes: Recent Labs  Lab 10/06/18 1358  TROPONINI <0.03   BNP (last 3 results) No results for input(s): PROBNP in the last 8760 hours.  HbA1C: No results for input(s): HGBA1C in the last 72 hours. CBG: Recent Labs  Lab 10/10/18 0723 10/10/18 1138 10/10/18 1732 10/10/18 2023 10/11/18 1101  GLUCAP 111* 110* 160* 166* 180*   Lipid Profile: No results for input(s): CHOL, HDL, LDLCALC, TRIG, CHOLHDL, LDLDIRECT in the last 72 hours. Thyroid Function Tests: No results for input(s): TSH, T4TOTAL, FREET4, T3FREE, THYROIDAB in the last 72 hours. Anemia Panel: No results for input(s): VITAMINB12, FOLATE, FERRITIN, TIBC, IRON, RETICCTPCT in the last 72 hours. Sepsis Labs: No results for input(s): PROCALCITON, LATICACIDVEN in the last 168 hours.  Recent Results (from the past 240 hour(s))  SARS Coronavirus 2 [facility] order, Performed in [facility] hospital lab)  Status: None   Collection Time: 10/07/18  2:32 PM  Result Value Ref Range Status   SARS Coronavirus 2 NEGATIVE NEGATIVE Final    Comment: (NOTE) If result is NEGATIVE SARS-CoV-2 target nucleic acids are NOT DETECTED. The SARS-CoV-2 RNA is generally detectable in upper and lower  respiratory specimens during the acute phase of infection. The lowest  concentration of SARS-CoV-2 viral copies this assay can detect is 250  copies / mL. A negative result does not preclude SARS-CoV-2 infection  and should not be used as the sole basis for treatment or other  patient management decisions.  A negative result may occur with  improper specimen collection / handling, submission of specimen other  than nasopharyngeal swab, presence of viral mutation(s) within the  areas targeted by this assay, and inadequate number of viral copies  (<250 copies / mL). A negative result must be combined with clinical  observations, patient history, and epidemiological information. If result is POSITIVE SARS-CoV-2 target nucleic acids are DETECTED. The SARS-CoV-2 RNA is generally detectable in upper and lower  respiratory specimens dur ing the acute phase of infection.  Positive   results are indicative of active infection with SARS-CoV-2.  Clinical  correlation with patient history and other diagnostic information is  necessary to determine patient infection status.  Positive results do  not rule out bacterial infection or co-infection with other viruses. If result is PRESUMPTIVE POSTIVE SARS-CoV-2 nucleic acids MAY BE PRESENT.   A presumptive positive result was obtained on the submitted specimen  and confirmed on repeat testing.  While 2019 novel coronavirus  (SARS-CoV-2) nucleic acids may be present in the submitted sample  additional confirmatory testing may be necessary for epidemiological  and / or clinical management purposes  to differentiate between  SARS-CoV-2 and other Sarbecovirus currently known to infect humans.  If clinically indicated additional testing with an alternate test  methodology [phone]) is advised. The SARS-CoV-2 RNA is generally  detectable in upper and lower respiratory sp ecimens during the acute  phase of infection. The expected result is Negative. Fact Sheet for Patients:  StrictlyIdeas.no Fact Sheet for Healthcare Providers: BankingDealers.co.za This test is not yet approved or cleared by the [place] FDA and has been authorized for detection and/or diagnosis of SARS-CoV-2 by FDA under an Emergency Use Authorization (EUA).  This EUA will remain in effect (meaning this test can be used) for the duration of the COVID-19 declaration under Section 564(b)(1) of the Act, 21 U.S.C. section 360bbb-3(b)(1), unless the authorization is terminated or revoked sooner. Performed at [facility], [place] [address], [place], [place] [address]      Radiology Studies: US Biopsy (kidney)  Result Date: 10/11/2018 INDICATION: 34 year old with acute kidney injury and request for random renal biopsy. EXAM: ULTRASOUND-GUIDED RANDOM RENAL BIOPSY MEDICATIONS: None. ANESTHESIA/SEDATION: Fentanyl  100 mcg IV; Versed 1.5 mg IV Moderate Sedation Time:  36 minutes The patient was continuously monitored during the procedure by the interventional radiology nurse under my direct supervision. FLUOROSCOPY TIME:  None COMPLICATIONS: None immediate. PROCEDURE: Informed written consent was obtained from the patient after a thorough discussion of the procedural risks, benefits and alternatives. All questions were addressed. Maximal Sterile Barrier Technique was utilized including mask, sterile gowns, sterile gloves, sterile drape, hand hygiene and skin antiseptic. A timeout was performed prior to the initiation of the procedure. Patient was placed prone. Both kidneys were evaluated with ultrasound. The right kidney was selected for biopsy. Right flank was prepped with chlorhexidine and sterile  field was created. Skin and soft tissues were anesthetized with 1% lidocaine. Using ultrasound guidance, 16 gauge core needle was directed into the right kidney lower pole from a posteromedial approach. Total of 2 core biopsies were obtained and specimens were placed in saline. Small perinephric hematoma was immediately identified following the second core biopsy. Blood flow was identified within the perinephric hematoma. Therefore, Gelfoam plugs and Gel-Foam slurry was injected into this perinephric hematoma. Bandage placed over the puncture site. Patient was very anxious after the procedure and could not tolerate lying prone. Therefore, the patient was placed on his back with head slightly elevated. FINDINGS: Negative for hydronephrosis. Two core biopsies obtained from the right kidney lower pole. Adequate specimens were obtained. Small perinephric hematoma was immediately identified following the second core biopsy. The hematoma slowly enlarging after the procedure. There was blood flow within the perinephric hematoma. Therefore, Gel-Foam was injected into the perinephric hematoma. At the end of the procedure, there was no  significant blood flow within the perinephric hematoma but evidence for a small amount of bleeding along the needle track within the right kidney lower pole. IMPRESSION: 1. Ultrasound-guided core biopsy of the right kidney lower pole. 2. Procedure was complicated by a post biopsy perinephric hematoma. Gel-Foam was injected in the perinephric hematoma. Patient was hemodynamically stable during and after the procedure. Plan for strict bed rest today and will get follow-up CBC. Procedure and plan discussed with Dr. Denton Brick. Electronically Signed   By: Markus Daft M.D.   On: 10/11/2018 10:27    Scheduled Meds: . sodium chloride   Intravenous Once  . atorvastatin  80 mg Oral q1800  . insulin aspart  0-5 Units Subcutaneous QHS  . insulin aspart  0-9 Units Subcutaneous TID WC   Continuous Infusions:    LOS: 4 days   Roxan Hockey, MD [facility]  If 7PM-7AM, please contact night-coverage [email or web address] Password [email or web address] 10/11/2018, 2:17 PM

## 2018-10-11 NOTE — Consult Note (Signed)
Chief Complaint: Patient was seen in consultation today for random renal biopsy at the request of Dr Alfonso Ellis  Referring Physician(s): Dr Maurene Capes  Supervising Physician: Markus Daft  Patient Status: [facility] - In-pt  History of Present Illness: Bill [name] is a 38 y.o. male   Renal failure (unclear if acute / subacute), > 20y DM2 + DPR, Hypervolemia, anemia Hb 6.2 MCV 80  BLE edema-- improved SOB; no fever Covid neg Anemia Progressing renal failure Proteinuria  Request for random renal biopsy per Nephrology    Past Medical History:  Diagnosis Date  . Diabetes mellitus without complication ([place])   . Diabetic retinopathy ([place])    PDR OU  . Stroke [facility])     Past Surgical History:  Procedure Laterality Date  . arm surgery      Allergies: Patient has no known allergies.  Medications: Prior to Admission medications   Medication Sig Start Date End Date Taking? Authorizing Provider  amLODipine (NORVASC) 10 MG tablet Take 1 tablet (10 mg total) by mouth daily. 01/26/18 01/26/19 Yes Nita Sells, MD  atorvastatin (LIPITOR) 80 MG tablet Take 1 tablet (80 mg total) by mouth daily at 6 PM. Patient taking differently: Take 80 mg by mouth daily.  01/26/18  Yes Nita Sells, MD  Ferrous Sulfate (IRON PO) Take 1 tablet by mouth daily.   Yes [provider]  losartan-hydrochlorothiazide (HYZAAR) 50-12.5 MG tablet Take 1 tablet by mouth daily. 07/17/18  Yes [provider]  metFORMIN (GLUCOPHAGE) 500 MG tablet Take 500 mg by mouth 2 (two) times daily with a meal.   Yes [provider]  aspirin 81 MG EC tablet Take 1 tablet (81 mg total) by mouth daily. Patient not taking: Reported on 10/06/2018 01/26/18   Nita Sells, MD  hydrochlorothiazide (HYDRODIURIL) 25 MG tablet Take 1 tablet (25 mg total) by mouth daily. Patient not taking: Reported on 10/06/2018 01/24/18 02/23/18  Long, Wonda Olds, MD     Family History  Problem Relation  Age of Onset  . Diabetes Mellitus II Mother   . Hypertension Mother   . Cataracts Mother   . Diabetes Mellitus II Father   . Stroke Father   . Hypertension Father   . Glaucoma Maternal Grandmother   . Amblyopia Neg Hx   . Blindness Neg Hx   . Macular degeneration Neg Hx   . Retinal detachment Neg Hx   . Strabismus Neg Hx   . Retinitis pigmentosa Neg Hx     Social History   Socioeconomic History  . Marital status: Married    Spouse name: Not on file  . Number of children: Not on file  . Years of education: Not on file  . Highest education level: Not on file  Occupational History  . Not on file  Social Needs  . Financial resource strain: Not on file  . Food insecurity:    Worry: Not on file    Inability: Not on file  . Transportation needs:    Medical: Not on file    Non-medical: Not on file  Tobacco Use  . Smoking status: Never Smoker  . Smokeless tobacco: Never Used  Substance and Sexual Activity  . Alcohol use: Yes    Comment: Occasionally.  . Drug use: Not on file  . Sexual activity: Not on file  Lifestyle  . Physical activity:    Days per week: Not on file    Minutes per session: Not on file  . Stress: Not on  file  Relationships  . Social connections:    Talks on phone: Not on file    Gets together: Not on file    Attends religious service: Not on file    Active member of club or organization: Not on file    Attends meetings of clubs or organizations: Not on file    Relationship status: Not on file  Other Topics Concern  . Not on file  Social History Narrative  . Not on file    Review of Systems: A 12 point ROS discussed and pertinent positives are indicated in the HPI above.  All other systems are negative.  Review of Systems  Constitutional: Positive for activity change, appetite change and fatigue. Negative for fever and unexpected weight change.  Gastrointestinal: Positive for nausea. Negative for abdominal pain.  Genitourinary: Positive for  decreased urine volume.  Musculoskeletal: Positive for back pain.  Neurological: Positive for weakness.  Psychiatric/Behavioral: Negative for behavioral problems and confusion.    Vital Signs: BP 140/85 (BP Location: Right Arm)   Pulse 82   Temp 98.3 F (36.8 C) (Oral)   Resp 17   Ht 5\' 11"  (1.803 m)   Wt 191 lb 9.3 oz (86.9 kg)   SpO2 100%   BMI 26.72 kg/m   Physical Exam Vitals signs reviewed.  Cardiovascular:     Rate and Rhythm: Normal rate and regular rhythm.     Heart sounds: Normal heart sounds.  Pulmonary:     Breath sounds: Normal breath sounds.  Abdominal:     General: Bowel sounds are normal.     Tenderness: There is no abdominal tenderness.  Musculoskeletal:     Right lower leg: Edema present.     Left lower leg: Edema present.  Skin:    General: Skin is dry.  Neurological:     Mental Status: He is alert and oriented to person, place, and time.  Psychiatric:        Mood and Affect: Mood normal.        Behavior: Behavior normal.        Thought Content: Thought content normal.        Judgment: Judgment normal.     Imaging: US Renal  Result Date: 10/07/2018 CLINICAL DATA:  Initial evaluation for acute renal injury. EXAM: RENAL / URINARY TRACT ULTRASOUND COMPLETE COMPARISON:  None available. FINDINGS: Right Kidney: Renal measurements: 11.6 x 5.7 x 7.3 cm = volume: 247.4 mL. Increased echogenicity seen within the renal parenchyma. No mass or hydronephrosis visualized. Left Kidney: Renal measurements: 11.7 x 7.6 x 6.2 cm = volume: 286.8 mL. Increased echogenicity seen within the renal parenchyma. No mass or hydronephrosis visualized. Bladder: Appears normal for degree of bladder distention. Bilateral pleural effusions noted. IMPRESSION: 1. Increased echogenicity within the renal parenchyma bilaterally, compatible with medical renal disease. No hydronephrosis. 2. Bilateral pleural effusions. Electronically Signed   By: Jeannine Boga M.D.   On: 10/07/2018  20:00   Dg Chest Port 1 View  Result Date: 10/06/2018 CLINICAL DATA:  Leg swelling and short of breath EXAM: PORTABLE CHEST 1 VIEW COMPARISON:  09/01/2005 FINDINGS: Normal heart size. Lungs are under aerated with bibasilar atelectasis. No pneumothorax or pleural effusion. IMPRESSION: Normal heart size. Lungs clear. No pneumothorax. No pleural effusion. Electronically Signed   By: Marybelle Killings M.D.   On: 10/06/2018 16:19   Vas [place] Lower Extremity Venous (dvt)  Result Date: 10/08/2018  Lower Venous Study Indications: Swelling.  Performing Technologist: Oliver Hum RVT  Examination Guidelines:  A complete evaluation includes B-mode imaging, spectral Doppler, color Doppler, and power Doppler as needed of all accessible portions of each vessel. Bilateral testing is considered an integral part of a complete examination. Limited examinations for reoccurring indications may be performed as noted.  +---------+---------------+---------+-----------+----------+-------+ RIGHT    CompressibilityPhasicitySpontaneityPropertiesSummary +---------+---------------+---------+-----------+----------+-------+ CFV      Full           Yes      Yes                          +---------+---------------+---------+-----------+----------+-------+ SFJ      Full                                                 +---------+---------------+---------+-----------+----------+-------+ FV Prox  Full                                                 +---------+---------------+---------+-----------+----------+-------+ FV Mid   Full                                                 +---------+---------------+---------+-----------+----------+-------+ FV DistalFull                                                 +---------+---------------+---------+-----------+----------+-------+ PFV      Full                                                 +---------+---------------+---------+-----------+----------+-------+  POP      Full           Yes      Yes                          +---------+---------------+---------+-----------+----------+-------+ PTV      Full                                                 +---------+---------------+---------+-----------+----------+-------+ PERO     Full                                                 +---------+---------------+---------+-----------+----------+-------+   +---------+---------------+---------+-----------+----------+-------+ LEFT     CompressibilityPhasicitySpontaneityPropertiesSummary +---------+---------------+---------+-----------+----------+-------+ CFV      Full           Yes      Yes                          +---------+---------------+---------+-----------+----------+-------+ SFJ      Full                                                 +---------+---------------+---------+-----------+----------+-------+  FV Prox  Full                                                 +---------+---------------+---------+-----------+----------+-------+ FV Mid   Full                                                 +---------+---------------+---------+-----------+----------+-------+ FV DistalFull                                                 +---------+---------------+---------+-----------+----------+-------+ PFV      Full                                                 +---------+---------------+---------+-----------+----------+-------+ POP      Full           Yes      Yes                          +---------+---------------+---------+-----------+----------+-------+ PTV      Full                                                 +---------+---------------+---------+-----------+----------+-------+ PERO     Full                                                 +---------+---------------+---------+-----------+----------+-------+     Summary: Right: There is no evidence of deep vein thrombosis in the lower  extremity. No cystic structure found in the popliteal fossa. Left: There is no evidence of deep vein thrombosis in the lower extremity. No cystic structure found in the popliteal fossa.  *See table(s) above for measurements and observations. Electronically signed by Ruta Hinds MD on 10/08/2018 at 5:53:38 PM.    Final     Labs:  CBC: Recent Labs    10/08/18 0643 10/09/18 0752 10/10/18 1432 10/11/18 0501  WBC 5.2 5.5 5.2 6.1  HGB 7.6* 7.9* 8.4* 8.2*  HCT 23.7* 24.6* 26.0* 25.5*  PLT 224 219 250 243    COAGS: Recent Labs    01/24/18 1807 10/10/18 1432  INR 0.95 1.1  APTT  --  36    BMP: Recent Labs    10/08/18 0643 10/09/18 0752 10/10/18 1432 10/11/18 0501  NA 141 140 138 139  K 4.4 4.2 4.1 4.3  CL 112* 110 102 103  CO2 21* 21* 25 25  GLUCOSE 115* 119* 149* 118*  BUN 44* 45* 55* 58*  CALCIUM 8.6* 9.0 9.0 9.1  CREATININE 3.65* 3.66* 4.42* 4.65*  GFRNONAA 20* 20* 16* 15*  GFRAA 23* 23* 18* 17*    LIVER FUNCTION TESTS: Recent Labs  01/24/18 1807 01/25/18 0400 10/06/18 1358 10/09/18 0752 10/10/18 1432  BILITOT 0.6 0.7 0.5  --   --   AST 14* 15 27  --   --   ALT 13 13 27   --   --   ALKPHOS 39 37* 59  --   --   PROT 6.4* 6.3* 7.3  --   --   ALBUMIN 3.2* 2.9* 3.0* 2.8* 3.0*    TUMOR MARKERS: No results for input(s): AFPTM, CEA, CA199, CHROMGRNA in the last 8760 hours.  Assessment and Plan:  Progressing renal failure Proteinuria Anemia  B LE edema Scheduled for random renal biopsy Risks and benefits of random renal biopsy  was discussed with the patient and/or patient's family including, but not limited to bleeding, infection, damage to adjacent structures or low yield requiring additional tests.  All of the questions were answered and there is agreement to proceed. Consent signed and in chart.   Thank you for this interesting consult.  I greatly enjoyed meeting Rynell Ciotti and look forward to participating in their care.  A copy of this  report was sent to the requesting provider on this date.  Electronically Signed: Lavonia Drafts, PA-C 10/11/2018, 8:02 AM   I spent a total of 20 Minutes    in face to face in clinical consultation, greater than 50% of which was counseling/coordinating care for random renal bx

## 2018-10-11 NOTE — Progress Notes (Signed)
Agree with IR PA note.    Risks and benefits of US guided renal biopsy was discussed with the patient and/or patient's family including, but not limited to bleeding, infection, damage to adjacent structures or low yield requiring additional tests.  All of the questions were answered and there is agreement to proceed.  Consent signed and in chart.

## 2018-10-11 NOTE — Procedures (Signed)
Interventional Radiology Procedure:   Indications: Renal failure  Procedure: US guided core biopsy of right kidney  Findings: No hydronephrosis.  2 cores from right kidney lower pole.  Immediate perinephric hematoma identified.  Hematoma was enlarging, therefore, gelfoam was injected into the expanding hematoma and blood flow in the hematoma seemed to stop at end of procedure but there was still be a small amount of bleeding in kidney along biopsy tract.  Complications: Perinephric hematoma     EBL: Less than 25 ml  Plan: Strict bedrest for 6 hours, first 2 hours prone.  Will get CBC at noon.     Jaanai Salemi R. Anselm Pancoast, MD  Pager: [phone]

## 2018-10-11 NOTE — Sedation Documentation (Signed)
Pt anxious moving around states he cant be still MD notified

## 2018-10-11 NOTE — Sedation Documentation (Signed)
Pt turned to supine position, much less anxious

## 2018-10-11 NOTE — Progress Notes (Signed)
Admit: 10/06/2018 LOS: 4  66M with renal failure (unclear if acute / subacute), > 20y DM2 + DPR, Hypervolemia, anemia Hb 6.2 MCV 80  Subjective:  . Renal biopsy this morning complicated by perinephric hematoma requiring Gelfoam injection . Prebiopsy hemoglobin 8.2 . Serum creatinine 4.65, potassium 4.3, bicarbonate 25 . Updated wife over video chat . Weight down approximately 5 kg, edema improving  04/29 0701 - 04/30 0700 In: 62 [IV Piggyback:62] Out: 1800 [Urine:1800]  Filed Weights   10/09/18 0500 10/10/18 0413 10/11/18 0600  Weight: 91.9 kg 90.4 kg 86.9 kg    Scheduled Meds: . sodium chloride   Intravenous Once  . amLODipine  5 mg Oral Daily  . atorvastatin  80 mg Oral q1800  . insulin aspart  0-5 Units Subcutaneous QHS  . insulin aspart  0-9 Units Subcutaneous TID WC   Continuous Infusions:  PRN Meds:.  Current Labs: reviewed  Results for Bill, [name] (MRN [id number]) as of 10/11/2018 11:30  Ref. Range 10/11/2018 00:44  Protein Creatinine Ratio Latest Ref Range: 0.00 - 0.15 mg/mgCre 1.83 (H)   Results for Bill, [name] (MRN [id number]) as of 10/10/2018 13:40  Ref. Range 10/07/2018 19:17  ANCA Proteinase 3 Latest Ref Range: 0.0 - 3.5 U/mL 5.9 (H)  Myeloperoxidase Abs Latest Ref Range: 0.0 - 9.0 U/mL <9.0   SPEP negative M spike  Ref. Range 10/07/2018 19:17  Kappa, lamda light chain ratio Latest Ref Range: 0.26 - 1.65  1.74 (H)    Ref. Range 10/06/2018 23:36  Saturation Ratios Latest Ref Range: 17.9 - 39.5 % 16 (L)  Ferritin Latest Ref Range: 24 - 336 ng/mL 304     Ref. Range 10/07/2018 19:17  Anti Nuclear Antibody (ANA) Latest Ref Range: Negative  Negative     Ref. Range 10/07/2018 19:17  C3 Complement Latest Ref Range: 82 - 167 mg/dL 128  Complement C4, Body Fluid Latest Ref Range: 14 - 44 mg/dL 39   Physical Exam:  Blood pressure 114/64, pulse 80, temperature 97.7 F (36.5 C), temperature source Oral, resp. rate 16, height 5\' 11"  (1.803 m), weight 86.9 kg,  SpO2 100 %. NAD, young male RRR nl s1s2 no rub CTAB 1-2+ LEE, pitting, to knees NO rashes/lesions Nonfocal, AAO x3; no asterixus  A 1. Progressive Renal Failure over past 78mo, unclear if acute or subacute; sub-nephrotic proteinuria 24h U Protein in process; hematuria present; On ARB at presentation 2. Perinephric hematoma following renal biopsy on 4/30 3. 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 Hypervolemia; high dietary Na intake; improving, responding to diuretics; holding with change in renal function 5. DM2, hx/o proliferative retinopathy, present for at least 18y 6. ANemia, mild Fe deficiency. No overt losses; 2u pRBC 4/25; s/p ESA and [facility] 4/27 7. COVID neg at admission  P . Follow/trend hemoglobins . Obtain renal ultrasound this afternoon . Hold diuretics . Medication Issues; o Preferred narcotic agents for pain control are hydromorphone, fentanyl, and methadone. Morphine should not be used.  o Baclofen should be avoided o Avoid oral sodium phosphate and magnesium citrate based laxatives / bowel preps    Bill Grippe MD 10/11/2018, 11:31 AM  Recent Labs  Lab 10/09/18 0752 10/10/18 1432 10/11/18 0501  NA 140 138 139  K 4.2 4.1 4.3  CL 110 102 103  CO2 21* 25 25  GLUCOSE 119* 149* 118*  BUN 45* 55* 58*  CREATININE 3.66* 4.42* 4.65*  CALCIUM 9.0 9.0 9.1  PHOS 5.4* 5.6*  --  Recent Labs  Lab 10/06/18 1358  10/09/18 0752 10/10/18 1432 10/11/18 0501  WBC 6.3   < > 5.5 5.2 6.1  NEUTROABS 4.8  --   --   --   --   HGB 6.2*   < > 7.9* 8.4* 8.2*  HCT 20.5*   < > 24.6* 26.0* 25.5*  MCV 80.4   < > 79.9* 78.5* 79.4*  PLT 240   < > 219 250 243   < > = values in this interval not displayed.

## 2018-10-11 NOTE — Progress Notes (Signed)
Patient ID: Bill [name], male   DOB: 1981/03/21, 38 y.o.   MRN: [id number] Patient became very anxious after the biopsy and could not tolerate lying prone.  Patient is now supine with head slightly elevated.

## 2018-10-11 NOTE — Sedation Documentation (Signed)
Pt remains anxious crying states he is scared

## 2018-10-11 NOTE — Progress Notes (Signed)
Ted hose pt wore through the night constricted his ankles and did not allow for venous return in his feet.  Feet swollen with large amount of fluid.  Discontinued wearing  the ted hose.

## 2018-10-11 NOTE — Plan of Care (Signed)
No acute events overnight. Patient NPO since midnight for renal biopsy today.

## 2018-10-12 ENCOUNTER — Inpatient Hospital Stay (HOSPITAL_COMMUNITY): Payer: 59

## 2018-10-12 LAB — GLUCOSE, CAPILLARY
Glucose-Capillary: 115 mg/dL — ABNORMAL HIGH (ref 70–99)
Glucose-Capillary: 123 mg/dL — ABNORMAL HIGH (ref 70–99)
Glucose-Capillary: 163 mg/dL — ABNORMAL HIGH (ref 70–99)
Glucose-Capillary: 168 mg/dL — ABNORMAL HIGH (ref 70–99)

## 2018-10-12 LAB — CBC
HCT: 18.5 % — ABNORMAL LOW (ref 39.0–52.0)
Hemoglobin: 5.9 g/dL — CL (ref 13.0–17.0)
MCH: 25.4 pg — ABNORMAL LOW (ref 26.0–34.0)
MCHC: 31.9 g/dL (ref 30.0–36.0)
MCV: 79.7 fL — ABNORMAL LOW (ref 80.0–100.0)
Platelets: 220 10*3/uL (ref 150–400)
RBC: 2.32 MIL/uL — ABNORMAL LOW (ref 4.22–5.81)
RDW: 16.6 % — ABNORMAL HIGH (ref 11.5–15.5)
WBC: 7.4 10*3/uL (ref 4.0–10.5)
nRBC: 0 % (ref 0.0–0.2)

## 2018-10-12 LAB — BASIC METABOLIC PANEL
Anion gap: 13 (ref 5–15)
BUN: 69 mg/dL — ABNORMAL HIGH (ref 6–20)
CO2: 22 mmol/L (ref 22–32)
Calcium: 8.5 mg/dL — ABNORMAL LOW (ref 8.9–10.3)
Chloride: 102 mmol/L (ref 98–111)
Creatinine, Ser: 5.52 mg/dL — ABNORMAL HIGH (ref 0.61–1.24)
GFR calc Af Amer: 14 mL/min — ABNORMAL LOW (ref >60)
GFR calc non Af Amer: 12 mL/min — ABNORMAL LOW (ref >60)
Glucose, Bld: 129 mg/dL — ABNORMAL HIGH (ref 70–99)
Potassium: 4.1 mmol/L (ref 3.5–5.1)
Sodium: 137 mmol/L (ref 135–145)

## 2018-10-12 LAB — PREPARE RBC (CROSSMATCH)

## 2018-10-12 MED ORDER — SODIUM CHLORIDE 0.9% IV SOLUTION
Freq: Once | INTRAVENOUS | Status: AC
  Administered 2018-10-12: 11:00:00 via INTRAVENOUS

## 2018-10-12 MED ORDER — FUROSEMIDE 10 MG/ML IJ SOLN
60.0000 mg | Freq: Once | INTRAMUSCULAR | Status: AC
  Administered 2018-10-12: 60 mg via INTRAVENOUS
  Filled 2018-10-12: qty 6

## 2018-10-12 MED ORDER — AMLODIPINE BESYLATE 5 MG PO TABS
2.5000 mg | ORAL_TABLET | Freq: Every day | ORAL | Status: DC
  Administered 2018-10-12 – 2018-10-14 (×3): 2.5 mg via ORAL
  Filled 2018-10-12 (×3): qty 1

## 2018-10-12 NOTE — Progress Notes (Signed)
PROGRESS NOTE    Bill [name]  FUX:[id number] DOB: 1981-06-08 DOA: 10/06/2018 PCP: Patient, No Pcp Per    Brief Narrative: 38 year old male with past medical history significant for nonhemorrhagic stroke, type 2 diabetes, hypertension who initially presented to the urgent care with shortness of breath that is started in night prior to presentation.  He denies fever but he report occasional dry cough.  Patient denies melena hematochezia.  He report worsening lower extremity edema since Wednesday prior to admission.  He also reports orthopnea.  COVID 19 test was sent out from urgent care center.  Pending results at this time. With severe anemia with a hemoglobin at 6, Acute renal failure with a creatinine at 3.5. Status post right kidney biopsy with postbiopsy hematoma   Assessment & Plan:   Active Problems:   Acute CVA (cerebrovascular accident) ([place])   Type 2 diabetes mellitus with vascular disease ([place])   Anemia  1)Perinephric Hematoma---- s/p Renal biopsy on 0/25/42 complicated by perinephric hematoma requiring Gelfoam injection, hemoglobin is down to 5.9 from 8.2, will transfuse 2 units of packed cells as noted below, follow-up renal ultrasound on 10/12/2018 without evidence of active bleeding discussed with IR attending Dr. Markus Daft and Nephrologist Dr. Joelyn Oms  2)Dyspnea --- most likely due to fluid overload rather than pulmonary infection, COVID 19 was Rule out; Test Negative X 2, one from [facility] urgent care center.   3)Acute on chronic Anemia----underlying anemia of CKD compounded by acute blood loss secondary to right-sided perinephric hematoma after kidney biopsy on 10/11/2018, serial H&H, transfuse as indicated, see #1 above, previously received 2 units of packed cells, previously received IV iron and Aranesp, Iron level normal at 46, ferritin 706, folic acid 13 vitamin B 500. Patient denies any history of melena, hematochezia, hematemesis. Risk, benefits and alternatives to  transfusion of blood products discussed. Indication for transfusion discussed. Consent obtained Transfuse 2 units of packed cells----each unit over 3 hours, please give Lasix 60 mg IV x1 after transfusion of 1st unit of PRBC  Patient with symptomatic anemia with hemoglobin down to 5.9 complains of fatigue and significant dizziness  4)AKI Vs acute/CKD----longstanding history of diabetes and hypertension, PR3 ANCA weakly positive at 5.9, ANA negative, s/p renal biopsy by IR on 10/11/2018 Creatinine 8 months ago was at 1.1. He presents with creatinine at 3.5, creatinine continues to trend up [place] negative for hydronephrosis, shows chronic medical diseases.  Nephrology consult appreciated  UPEP SPEP.:  Kappa light chain 126, Lamda 72,  Complement normal.  Proteinuria.  Hold IV lasix.   4-HTN-    BP higher, restart amlodipine at 2.5 mg daily due   5-DM type 2; Continue SSI. Hold metformin.   6-LE edema; Doppler negative for DVT.  ECHO Diastolic dysfunction.  out patient referral with cardiology.   7-Acute on chronic diastolic dysfunction. Hold scheduled IV lasix.   referral for out patient cardiology. [place] [facility] will arrange appointment.    DVT prophylaxis: SCD no anticoagulation due to anemia.  Code Status: full code.  Family Communication: mother over phone Disposition Plan: remain in the hospital for treatment of renal failure, anemia-renal perinephric hematoma after Renal biopsy on 10/11/2018  Consultants:   Nephrology  IR renal biopsy  Procedures:   Rt renal biopsy on 10/11/2018 by IR   Antimicrobials:   none   Subjective: No fevers, complains of fatigue, dizziness,   Objective: Vitals:   10/11/18 1736 10/12/18 0020 10/12/18 0042 10/12/18 0622  BP: 108/64 94/65 (!) 101/53   Pulse: 100  90    Resp: 17 18    Temp: 97.8 F (36.6 C) 98.2 F (36.8 C)    TempSrc: Oral Oral    SpO2: 97% 100%    Weight:    85.9 kg  Height:        Intake/Output Summary (Last 24  hours) at 10/12/2018 0754 Last data filed at 10/12/2018 0630 Gross per 24 hour  Intake --  Output 200 ml  Net -200 ml   Filed Weights   10/10/18 0413 10/11/18 0600 10/12/18 0622  Weight: 90.4 kg 86.9 kg 85.9 kg    Physical Exam  Patient is examined daily including today on 10/12/18, exams remain the same as of yesterday except that has changed   Gen:- Awake Alert,  In no apparent distress  HEENT:- [place].AT, No sclera icterus Neck-Supple Neck,No JVD,.  Lungs-diminished in bases with fine bibasilar rales CV- S1, S2 normal Abd-  +ve B.Sounds, Abd Soft, No tenderness,    Extremity/Skin:-2+ pitting edema, good pedal pulses Psych-affect is appropriate, oriented x3 Neuro-no new focal deficits, no tremors MSK--- right lower back with dressing over right kidney biopsy site  CBC: Recent Labs  Lab 10/06/18 1358  10/10/18 1432 10/11/18 0501 10/11/18 1140 10/11/18 1818 10/12/18 0419  WBC 6.3   < > 5.2 6.1 6.0 7.5 7.4  NEUTROABS 4.8  --   --   --   --   --   --   HGB 6.2*   < > 8.4* 8.2* 6.9* 6.9* 5.9*  HCT 20.5*   < > 26.0* 25.5* 21.6* 21.4* 18.5*  MCV 80.4   < > 78.5* 79.4* 80.0 79.6* 79.7*  PLT 240   < > 250 243 221 235 220   < > = values in this interval not displayed.   Basic Metabolic Panel: Recent Labs  Lab 10/08/18 0643 10/09/18 0752 10/10/18 1432 10/11/18 0501 10/12/18 0419  NA 141 140 138 139 137  K 4.4 4.2 4.1 4.3 4.1  CL 112* 110 102 103 102  CO2 21* 21* 25 25 22   GLUCOSE 115* 119* 149* 118* 129*  BUN 44* 45* 55* 58* 69*  CREATININE 3.65* 3.66* 4.42* 4.65* 5.52*  CALCIUM 8.6* 9.0 9.0 9.1 8.5*  PHOS  --  5.4* 5.6*  --   --    GFR: Estimated Creatinine Clearance: 19.1 mL/min (A) (by C-G formula based on SCr of 5.52 mg/dL (H)). Liver Function Tests: Recent Labs  Lab 10/06/18 1358 10/09/18 0752 10/10/18 1432  AST 27  --   --   ALT 27  --   --   ALKPHOS 59  --   --   BILITOT 0.5  --   --   PROT 7.3  --   --   ALBUMIN 3.0* 2.8* 3.0*   No results for  input(s): LIPASE, AMYLASE in the last 168 hours. No results for input(s): AMMONIA in the last 168 hours. Coagulation Profile: Recent Labs  Lab 10/10/18 1432  INR 1.1   Cardiac Enzymes: Recent Labs  Lab 10/06/18 1358  TROPONINI <0.03   BNP (last 3 results) No results for input(s): PROBNP in the last 8760 hours. HbA1C: No results for input(s): HGBA1C in the last 72 hours. CBG: Recent Labs  Lab 10/10/18 2023 10/11/18 1101 10/11/18 1544 10/11/18 2058 10/12/18 0040  GLUCAP 166* 180* 100* 140* 123*   Lipid Profile: No results for input(s): CHOL, HDL, LDLCALC, TRIG, CHOLHDL, LDLDIRECT in the last 72 hours. Thyroid Function Tests: No results for input(s): TSH, T4TOTAL,  FREET4, T3FREE, THYROIDAB in the last 72 hours. Anemia Panel: No results for input(s): VITAMINB12, FOLATE, FERRITIN, TIBC, IRON, RETICCTPCT in the last 72 hours. Sepsis Labs: No results for input(s): PROCALCITON, LATICACIDVEN in the last 168 hours.  Recent Results (from the past 240 hour(s))  SARS Coronavirus 2 [facility] order, Performed in [facility] hospital lab)     Status: None   Collection Time: 10/07/18  2:32 PM  Result Value Ref Range Status   SARS Coronavirus 2 NEGATIVE NEGATIVE Final    Comment: (NOTE) If result is NEGATIVE SARS-CoV-2 target nucleic acids are NOT DETECTED. The SARS-CoV-2 RNA is generally detectable in upper and lower  respiratory specimens during the acute phase of infection. The lowest  concentration of SARS-CoV-2 viral copies this assay can detect is 250  copies / mL. A negative result does not preclude SARS-CoV-2 infection  and should not be used as the sole basis for treatment or other  patient management decisions.  A negative result may occur with  improper specimen collection / handling, submission of specimen other  than nasopharyngeal swab, presence of viral mutation(s) within the  areas targeted by this assay, and inadequate number of viral copies  (<250 copies / mL). A  negative result must be combined with clinical  observations, patient history, and epidemiological information. If result is POSITIVE SARS-CoV-2 target nucleic acids are DETECTED. The SARS-CoV-2 RNA is generally detectable in upper and lower  respiratory specimens dur ing the acute phase of infection.  Positive  results are indicative of active infection with SARS-CoV-2.  Clinical  correlation with patient history and other diagnostic information is  necessary to determine patient infection status.  Positive results do  not rule out bacterial infection or co-infection with other viruses. If result is PRESUMPTIVE POSTIVE SARS-CoV-2 nucleic acids MAY BE PRESENT.   A presumptive positive result was obtained on the submitted specimen  and confirmed on repeat testing.  While 2019 novel coronavirus  (SARS-CoV-2) nucleic acids may be present in the submitted sample  additional confirmatory testing may be necessary for epidemiological  and / or clinical management purposes  to differentiate between  SARS-CoV-2 and other Sarbecovirus currently known to infect humans.  If clinically indicated additional testing with an alternate test  methodology [phone]) is advised. The SARS-CoV-2 RNA is generally  detectable in upper and lower respiratory sp ecimens during the acute  phase of infection. The expected result is Negative. Fact Sheet for Patients:  StrictlyIdeas.no Fact Sheet for Healthcare Providers: BankingDealers.co.za This test is not yet approved or cleared by the [place] FDA and has been authorized for detection and/or diagnosis of SARS-CoV-2 by FDA under an Emergency Use Authorization (EUA).  This EUA will remain in effect (meaning this test can be used) for the duration of the COVID-19 declaration under Section 564(b)(1) of the Act, 21 U.S.C. section 360bbb-3(b)(1), unless the authorization is terminated or revoked sooner. Performed  at [facility], [place] [address], [place], [place] [address]      Radiology Studies: US Renal  Result Date: 10/11/2018 CLINICAL DATA:  38 year old with renal failure and recent right renal biopsy. Patient developed a perinephric hematoma immediately following the renal biopsy. Decreased hemoglobin since the biopsy and need to follow-up perinephric hematoma. EXAM: RENAL / URINARY TRACT ULTRASOUND COMPLETE COMPARISON:  10/07/1998 20.  Biopsy images from 10/11/2018 FINDINGS: Right Kidney: Renal measurements: 12.5 x 6.1 x 7.3 cm = volume: 295 mL. Slightly increased echogenicity in right kidney is similar to the prior diagnostic  examination. There is now a small amount of fluid around the kidney. No evidence for active bleeding based on the color Doppler images. Complex collection along the inferior aspect of the right kidney is compatible with the known hematoma. There is echogenic material within this hematoma compatible with the recently injected Gel-Foam. The hematoma roughly measures 4.8 x 3.6 x 4.2 cm. Negative for right hydronephrosis. No significant compression on the right kidney. Left Kidney: Renal measurements: 11.4 x 6.4 x 5.3 cm = volume: 203 mL. Slightly increased echogenicity is similar to the previous examination. Negative for left hydronephrosis. Bladder: Appears normal for degree of bladder distention. Right ureter jet is present. IMPRESSION: 1. Small perinephric hematoma associated with the right kidney lower pole. Echogenic material within the hematoma is compatible with the a Gel-Foam injected during the biopsy procedure. No evidence for active bleeding from the right kidney lower pole. 2. Negative for hydronephrosis. 3. Stable appearance of left kidney without hydronephrosis. Electronically Signed   By: Markus Daft M.D.   On: 10/11/2018 14:59   US Biopsy (kidney)  Addendum Date: 10/11/2018   ADDENDUM REPORT: 10/11/2018 14:51 ADDENDUM: Correction to the report: Complication: Small  perinephric hematoma. Gel-Foam was injected within the perinephric hematoma. Patient was hemodynamically stable during and after the procedure. SIR level B: Nominal therapy (including overnight admission for observation), no consequence. Electronically Signed   By: Markus Daft M.D.   On: 10/11/2018 14:51   Result Date: 10/11/2018 INDICATION: 89 year old with acute kidney injury and request for random renal biopsy. EXAM: ULTRASOUND-GUIDED RANDOM RENAL BIOPSY MEDICATIONS: None. ANESTHESIA/SEDATION: Fentanyl 100 mcg IV; Versed 1.5 mg IV Moderate Sedation Time:  36 minutes The patient was continuously monitored during the procedure by the interventional radiology nurse under my direct supervision. FLUOROSCOPY TIME:  None COMPLICATIONS: None immediate. PROCEDURE: Informed written consent was obtained from the patient after a thorough discussion of the procedural risks, benefits and alternatives. All questions were addressed. Maximal Sterile Barrier Technique was utilized including mask, sterile gowns, sterile gloves, sterile drape, hand hygiene and skin antiseptic. A timeout was performed prior to the initiation of the procedure. Patient was placed prone. Both kidneys were evaluated with ultrasound. The right kidney was selected for biopsy. Right flank was prepped with chlorhexidine and sterile field was created. Skin and soft tissues were anesthetized with 1% lidocaine. Using ultrasound guidance, 16 gauge core needle was directed into the right kidney lower pole from a posteromedial approach. Total of 2 core biopsies were obtained and specimens were placed in saline. Small perinephric hematoma was immediately identified following the second core biopsy. Blood flow was identified within the perinephric hematoma. Therefore, Gelfoam plugs and Gel-Foam slurry was injected into this perinephric hematoma. Bandage placed over the puncture site. Patient was very anxious after the procedure and could not tolerate lying prone.  Therefore, the patient was placed on his back with head slightly elevated. FINDINGS: Negative for hydronephrosis. Two core biopsies obtained from the right kidney lower pole. Adequate specimens were obtained. Small perinephric hematoma was immediately identified following the second core biopsy. The hematoma slowly enlarging after the procedure. There was blood flow within the perinephric hematoma. Therefore, Gel-Foam was injected into the perinephric hematoma. At the end of the procedure, there was no significant blood flow within the perinephric hematoma but evidence for a small amount of bleeding along the needle track within the right kidney lower pole. IMPRESSION: 1. Ultrasound-guided core biopsy of the right kidney lower pole. 2. Procedure was complicated by a post biopsy perinephric  hematoma. Gel-Foam was injected in the perinephric hematoma. Patient was hemodynamically stable during and after the procedure. Plan for strict bed rest today and will get follow-up CBC. Procedure and plan discussed with Dr. Denton Brick. Electronically Signed: By: Markus Daft M.D. On: 10/11/2018 10:27    Scheduled Meds:  sodium chloride   Intravenous Once   sodium chloride   Intravenous Once   atorvastatin  80 mg Oral q1800   furosemide  60 mg Intravenous Once   insulin aspart  0-5 Units Subcutaneous QHS   insulin aspart  0-9 Units Subcutaneous TID WC   Continuous Infusions:   LOS: 5 days   Roxan Hockey, MD [facility]  If 7PM-7AM, please contact night-coverage [email or web address] Password [facility] 10/12/2018, 7:54 AM

## 2018-10-12 NOTE — Progress Notes (Signed)
Referring Physician(s): Dr Alfonso Ellis  Supervising Physician: Markus Daft  Patient Status:  [facility] - In-pt  Chief Complaint:  Random renal biopsy yesterday in Radiology Post small perinephric hemorrhage    Subjective:  [place] yesterday: IMPRESSION: 1. Small perinephric hematoma associated with the right kidney lower pole. Echogenic material within the hematoma is compatible with the a Gel-Foam injected during the biopsy procedure. No evidence for active bleeding from the right kidney lower pole. 2. Negative for hydronephrosis. 3. Stable appearance of left kidney without hydronephrosis.  Pt up in chair; ambulating in room Denies pain; sob Any issues  Hg this am 5.9 (6.9)  Allergies: Patient has no known allergies.  Medications: Prior to Admission medications   Medication Sig Start Date End Date Taking? Authorizing Provider  amLODipine (NORVASC) 10 MG tablet Take 1 tablet (10 mg total) by mouth daily. 01/26/18 01/26/19 Yes Nita Sells, MD  atorvastatin (LIPITOR) 80 MG tablet Take 1 tablet (80 mg total) by mouth daily at 6 PM. Patient taking differently: Take 80 mg by mouth daily.  01/26/18  Yes Nita Sells, MD  Ferrous Sulfate (IRON PO) Take 1 tablet by mouth daily.   Yes [provider]  losartan-hydrochlorothiazide (HYZAAR) 50-12.5 MG tablet Take 1 tablet by mouth daily. 07/17/18  Yes [provider]  metFORMIN (GLUCOPHAGE) 500 MG tablet Take 500 mg by mouth 2 (two) times daily with a meal.   Yes [provider]  aspirin 81 MG EC tablet Take 1 tablet (81 mg total) by mouth daily. Patient not taking: Reported on 10/06/2018 01/26/18   Nita Sells, MD  hydrochlorothiazide (HYDRODIURIL) 25 MG tablet Take 1 tablet (25 mg total) by mouth daily. Patient not taking: Reported on 10/06/2018 01/24/18 02/23/18  Margette Fast, MD     Vital Signs: BP (!) 144/84 (BP Location: Right Arm)    Pulse 87    Temp 98.6 F (37 C) (Oral)    Resp 18     Ht 5\' 11"  (1.803 m)    Wt 189 lb 6.4 oz (85.9 kg)    SpO2 97%    BMI 26.42 kg/m   Physical Exam Vitals signs reviewed.  Musculoskeletal: Normal range of motion.  Skin:    General: Skin is warm and dry.     Comments: Site of Bx Rt flank is NT  No bleeding No hematoma  Neurological:     Mental Status: He is alert and oriented to person, place, and time.  Psychiatric:        Behavior: Behavior normal.     Imaging: US Renal  Result Date: 10/11/2018 CLINICAL DATA:  38 year old with renal failure and recent right renal biopsy. Patient developed a perinephric hematoma immediately following the renal biopsy. Decreased hemoglobin since the biopsy and need to follow-up perinephric hematoma. EXAM: RENAL / URINARY TRACT ULTRASOUND COMPLETE COMPARISON:  10/07/1998 20.  Biopsy images from 10/11/2018 FINDINGS: Right Kidney: Renal measurements: 12.5 x 6.1 x 7.3 cm = volume: 295 mL. Slightly increased echogenicity in right kidney is similar to the prior diagnostic examination. There is now a small amount of fluid around the kidney. No evidence for active bleeding based on the color Doppler images. Complex collection along the inferior aspect of the right kidney is compatible with the known hematoma. There is echogenic material within this hematoma compatible with the recently injected Gel-Foam. The hematoma roughly measures 4.8 x 3.6 x 4.2 cm. Negative for right hydronephrosis. No significant compression on the right kidney. Left Kidney: Renal measurements:  11.4 x 6.4 x 5.3 cm = volume: 203 mL. Slightly increased echogenicity is similar to the previous examination. Negative for left hydronephrosis. Bladder: Appears normal for degree of bladder distention. Right ureter jet is present. IMPRESSION: 1. Small perinephric hematoma associated with the right kidney lower pole. Echogenic material within the hematoma is compatible with the a Gel-Foam injected during the biopsy procedure. No evidence for active bleeding  from the right kidney lower pole. 2. Negative for hydronephrosis. 3. Stable appearance of left kidney without hydronephrosis. Electronically Signed   By: Markus Daft M.D.   On: 10/11/2018 14:59   US Biopsy (kidney)  Addendum Date: 10/11/2018   ADDENDUM REPORT: 10/11/2018 14:51 ADDENDUM: Correction to the report: Complication: Small perinephric hematoma. Gel-Foam was injected within the perinephric hematoma. Patient was hemodynamically stable during and after the procedure. SIR level B: Nominal therapy (including overnight admission for observation), no consequence. Electronically Signed   By: Markus Daft M.D.   On: 10/11/2018 14:51   Result Date: 10/11/2018 INDICATION: 38 year old with acute kidney injury and request for random renal biopsy. EXAM: ULTRASOUND-GUIDED RANDOM RENAL BIOPSY MEDICATIONS: None. ANESTHESIA/SEDATION: Fentanyl 100 mcg IV; Versed 1.5 mg IV Moderate Sedation Time:  36 minutes The patient was continuously monitored during the procedure by the interventional radiology nurse under my direct supervision. FLUOROSCOPY TIME:  None COMPLICATIONS: None immediate. PROCEDURE: Informed written consent was obtained from the patient after a thorough discussion of the procedural risks, benefits and alternatives. All questions were addressed. Maximal Sterile Barrier Technique was utilized including mask, sterile gowns, sterile gloves, sterile drape, hand hygiene and skin antiseptic. A timeout was performed prior to the initiation of the procedure. Patient was placed prone. Both kidneys were evaluated with ultrasound. The right kidney was selected for biopsy. Right flank was prepped with chlorhexidine and sterile field was created. Skin and soft tissues were anesthetized with 1% lidocaine. Using ultrasound guidance, 16 gauge core needle was directed into the right kidney lower pole from a posteromedial approach. Total of 2 core biopsies were obtained and specimens were placed in saline. Small perinephric  hematoma was immediately identified following the second core biopsy. Blood flow was identified within the perinephric hematoma. Therefore, Gelfoam plugs and Gel-Foam slurry was injected into this perinephric hematoma. Bandage placed over the puncture site. Patient was very anxious after the procedure and could not tolerate lying prone. Therefore, the patient was placed on his back with head slightly elevated. FINDINGS: Negative for hydronephrosis. Two core biopsies obtained from the right kidney lower pole. Adequate specimens were obtained. Small perinephric hematoma was immediately identified following the second core biopsy. The hematoma slowly enlarging after the procedure. There was blood flow within the perinephric hematoma. Therefore, Gel-Foam was injected into the perinephric hematoma. At the end of the procedure, there was no significant blood flow within the perinephric hematoma but evidence for a small amount of bleeding along the needle track within the right kidney lower pole. IMPRESSION: 1. Ultrasound-guided core biopsy of the right kidney lower pole. 2. Procedure was complicated by a post biopsy perinephric hematoma. Gel-Foam was injected in the perinephric hematoma. Patient was hemodynamically stable during and after the procedure. Plan for strict bed rest today and will get follow-up CBC. Procedure and plan discussed with Dr. Denton Brick. Electronically Signed: By: Markus Daft M.D. On: 10/11/2018 10:27   Vas [place] Lower Extremity Venous (dvt)  Result Date: 10/08/2018  Lower Venous Study Indications: Swelling.  Performing Technologist: Oliver Hum RVT  Examination Guidelines: A complete evaluation includes B-mode  imaging, spectral Doppler, color Doppler, and power Doppler as needed of all accessible portions of each vessel. Bilateral testing is considered an integral part of a complete examination. Limited examinations for reoccurring indications may be performed as noted.   +---------+---------------+---------+-----------+----------+-------+  RIGHT     Compressibility Phasicity Spontaneity Properties Summary  +---------+---------------+---------+-----------+----------+-------+  CFV       Full            Yes       Yes                             +---------+---------------+---------+-----------+----------+-------+  SFJ       Full                                                      +---------+---------------+---------+-----------+----------+-------+  FV Prox   Full                                                      +---------+---------------+---------+-----------+----------+-------+  FV Mid    Full                                                      +---------+---------------+---------+-----------+----------+-------+  FV Distal Full                                                      +---------+---------------+---------+-----------+----------+-------+  PFV       Full                                                      +---------+---------------+---------+-----------+----------+-------+  POP       Full            Yes       Yes                             +---------+---------------+---------+-----------+----------+-------+  PTV       Full                                                      +---------+---------------+---------+-----------+----------+-------+  PERO      Full                                                      +---------+---------------+---------+-----------+----------+-------+   +---------+---------------+---------+-----------+----------+-------+  LEFT      Compressibility Phasicity Spontaneity Properties Summary  +---------+---------------+---------+-----------+----------+-------+  CFV       Full            Yes       Yes                             +---------+---------------+---------+-----------+----------+-------+  SFJ       Full                                                      +---------+---------------+---------+-----------+----------+-------+  FV Prox   Full                                                       +---------+---------------+---------+-----------+----------+-------+  FV Mid    Full                                                      +---------+---------------+---------+-----------+----------+-------+  FV Distal Full                                                      +---------+---------------+---------+-----------+----------+-------+  PFV       Full                                                      +---------+---------------+---------+-----------+----------+-------+  POP       Full            Yes       Yes                             +---------+---------------+---------+-----------+----------+-------+  PTV       Full                                                      +---------+---------------+---------+-----------+----------+-------+  PERO      Full                                                      +---------+---------------+---------+-----------+----------+-------+     Summary: Right: There is no evidence of deep vein thrombosis in the lower extremity. No cystic structure found in the popliteal fossa. Left: There is no evidence of deep vein thrombosis in the lower extremity. No cystic structure  found in the popliteal fossa.  *See table(s) above for measurements and observations. Electronically signed by Ruta Hinds MD on 10/08/2018 at 5:53:38 PM.    Final     Labs:  CBC: Recent Labs    10/11/18 0501 10/11/18 1140 10/11/18 1818 10/12/18 0419  WBC 6.1 6.0 7.5 7.4  HGB 8.2* 6.9* 6.9* 5.9*  HCT 25.5* 21.6* 21.4* 18.5*  PLT 243 221 235 220    COAGS: Recent Labs    01/24/18 1807 10/10/18 1432  INR 0.95 1.1  APTT  --  36    BMP: Recent Labs    10/09/18 0752 10/10/18 1432 10/11/18 0501 10/12/18 0419  NA 140 138 139 137  K 4.2 4.1 4.3 4.1  CL 110 102 103 102  CO2 21* 25 25 22   GLUCOSE 119* 149* 118* 129*  BUN 45* 55* 58* 69*  CALCIUM 9.0 9.0 9.1 8.5*  CREATININE 3.66* 4.42* 4.65* 5.52*  GFRNONAA 20* 16* 15* 12*   GFRAA 23* 18* 17* 14*    LIVER FUNCTION TESTS: Recent Labs    01/24/18 1807 01/25/18 0400 10/06/18 1358 10/09/18 0752 10/10/18 1432  BILITOT 0.6 0.7 0.5  --   --   AST 14* 15 27  --   --   ALT 13 13 27   --   --   ALKPHOS 39 37* 59  --   --   PROT 6.4* 6.3* 7.3  --   --   ALBUMIN 3.2* 2.9* 3.0* 2.8* 3.0*    Assessment and Plan:  Post perinephric hemorrhage post random renal biopsy 4/30 Hg 5.9 this am from 6.9 yesterday Asymptomatic pt Will follow with Dr Joelyn Oms and Dr Denton Brick Will inform Dr Anselm Pancoast  Electronically Signed: Lavonia Drafts, PA-C 10/12/2018, 8:41 AM   I spent a total of 15 Minutes at the the patient's bedside AND on the patient's hospital floor or unit, greater than 50% of which was counseling/coordinating care for random renal bx

## 2018-10-12 NOTE — Progress Notes (Signed)
Admit: 10/06/2018 LOS: 5  44M with renal failure (unclear if acute / subacute), > 20y DM2 + DPR, Hypervolemia, anemia Hb 6.2 MCV 80  Subjective:  Marland Kitchen Hb down to 5.9, given 2u PRBC . Denies flank pain, grossly bloody urine  . Urine in toilet is normal appearing . Renal US today w/o expansion of hematoma or evidence of extravasation . BP and HR stable . Edema improved/ weight down . Holding furosemide  04/30 0701 - 05/01 0700 In: -  Out: 200 [Urine:200]  Filed Weights   10/10/18 0413 10/11/18 0600 10/12/18 0622  Weight: 90.4 kg 86.9 kg 85.9 kg    Scheduled Meds: . amLODipine  2.5 mg Oral Daily  . atorvastatin  80 mg Oral q1800  . insulin aspart  0-5 Units Subcutaneous QHS  . insulin aspart  0-9 Units Subcutaneous TID WC   Continuous Infusions:  PRN Meds:.  Current Labs: reviewed  Results for DMAURI, ROSENOW (MRN [id number]) as of 10/11/2018 11:30  Ref. Range 10/11/2018 00:44  Protein Creatinine Ratio Latest Ref Range: 0.00 - 0.15 mg/mgCre 1.83 (H)   Results for HILLMAN, ATTIG (MRN [id number]) as of 10/10/2018 13:40  Ref. Range 10/07/2018 19:17  ANCA Proteinase 3 Latest Ref Range: 0.0 - 3.5 U/mL 5.9 (H)  Myeloperoxidase Abs Latest Ref Range: 0.0 - 9.0 U/mL <9.0   SPEP negative M spike  Ref. Range 10/07/2018 19:17  Kappa, lamda light chain ratio Latest Ref Range: 0.26 - 1.65  1.74 (H)    Ref. Range 10/06/2018 23:36  Saturation Ratios Latest Ref Range: 17.9 - 39.5 % 16 (L)  Ferritin Latest Ref Range: 24 - 336 ng/mL 304     Ref. Range 10/07/2018 19:17  Anti Nuclear Antibody (ANA) Latest Ref Range: Negative  Negative     Ref. Range 10/07/2018 19:17  C3 Complement Latest Ref Range: 82 - 167 mg/dL 128  Complement C4, Body Fluid Latest Ref Range: 14 - 44 mg/dL 39   Physical Exam:  Blood pressure (!) 156/93, pulse 87, temperature 98.6 F (37 C), temperature source Oral, resp. rate 16, height 5\' 11"  (1.803 m), weight 85.9 kg, SpO2 100 %. NAD, young male; lying supine appears  comfortably RRR nl s1s2 no rub CTAB 1 LEE, pitting, improved NO rashes/lesions Nonfocal, AAO x3; no asterixus  A 1. Progressive Renal Failure over past 60mo, unclear if acute or subacute; sub-nephrotic proteinuria; hematuria present; On ARB at presentation 2. Perinephric hematoma following renal biopsy on 4/30 3. 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 Anemia with ABLA and related to #1, transfused 2u pRBC today 5. Hypervolemia; high dietary Na intake; improving, responding to diuretics; holding with change in renal function 6. DM2, hx/o proliferative retinopathy, present for at least 18y 7. ANemia, mild Fe deficiency. No overt losses; 2u pRBC 4/25; s/p ESA and Ferehem 4/27 -- Redose 5/4 8. COVID neg at admission  P  . Cont Hold diuretics . Trend Hb . Daily weights, Daily Renal Panel, Strict I/Os, Avoid nephrotoxins (NSAIDs, judicious IV Contrast)  . Medication Issues; o Preferred narcotic agents for pain control are hydromorphone, fentanyl, and methadone. Morphine should not be used.  o Baclofen should be avoided o Avoid oral sodium phosphate and magnesium citrate based laxatives / bowel preps    Pearson Grippe MD 10/12/2018, 2:32 PM  Recent Labs  Lab 10/09/18 0752 10/10/18 1432 10/11/18 0501 10/12/18 0419  NA 140 138 139 137  K 4.2 4.1 4.3 4.1  CL 110 102 103 102  CO2 21* 25 25 22   GLUCOSE 119* 149* 118* 129*  BUN 45* 55* 58* 69*  CREATININE 3.66* 4.42* 4.65* 5.52*  CALCIUM 9.0 9.0 9.1 8.5*  PHOS 5.4* 5.6*  --   --    Recent Labs  Lab 10/06/18 1358  10/11/18 1140 10/11/18 1818 10/12/18 0419  WBC 6.3   < > 6.0 7.5 7.4  NEUTROABS 4.8  --   --   --   --   HGB 6.2*   < > 6.9* 6.9* 5.9*  HCT 20.5*   < > 21.6* 21.4* 18.5*  MCV 80.4   < > 80.0 79.6* 79.7*  PLT 240   < > 221 235 220   < > = values in this interval not displayed.

## 2018-10-13 DIAGNOSIS — D62 Acute posthemorrhagic anemia: Secondary | ICD-10-CM | POA: Diagnosis not present

## 2018-10-13 DIAGNOSIS — E139 Other specified diabetes mellitus without complications: Secondary | ICD-10-CM | POA: Diagnosis present

## 2018-10-13 DIAGNOSIS — S37019A Minor contusion of unspecified kidney, initial encounter: Secondary | ICD-10-CM | POA: Diagnosis not present

## 2018-10-13 DIAGNOSIS — I5030 Unspecified diastolic (congestive) heart failure: Secondary | ICD-10-CM | POA: Diagnosis present

## 2018-10-13 DIAGNOSIS — I10 Essential (primary) hypertension: Secondary | ICD-10-CM | POA: Diagnosis present

## 2018-10-13 DIAGNOSIS — N179 Acute kidney failure, unspecified: Secondary | ICD-10-CM | POA: Diagnosis present

## 2018-10-13 LAB — RENAL FUNCTION PANEL
Albumin: 2.8 g/dL — ABNORMAL LOW (ref 3.5–5.0)
Anion gap: 9 (ref 5–15)
BUN: 68 mg/dL — ABNORMAL HIGH (ref 6–20)
CO2: 23 mmol/L (ref 22–32)
Calcium: 8.8 mg/dL — ABNORMAL LOW (ref 8.9–10.3)
Chloride: 107 mmol/L (ref 98–111)
Creatinine, Ser: 4.87 mg/dL — ABNORMAL HIGH (ref 0.61–1.24)
GFR calc Af Amer: 16 mL/min — ABNORMAL LOW (ref >60)
GFR calc non Af Amer: 14 mL/min — ABNORMAL LOW (ref >60)
Glucose, Bld: 142 mg/dL — ABNORMAL HIGH (ref 70–99)
Phosphorus: 4.5 mg/dL (ref 2.5–4.6)
Potassium: 4.5 mmol/L (ref 3.5–5.1)
Sodium: 139 mmol/L (ref 135–145)

## 2018-10-13 LAB — TYPE AND SCREEN
ABO/RH(D): A POS
Antibody Screen: NEGATIVE
Unit division: 0
Unit division: 0

## 2018-10-13 LAB — CBC
HCT: 24.7 % — ABNORMAL LOW (ref 39.0–52.0)
HCT: 23.8 % — ABNORMAL LOW (ref 39.0–52.0)
HCT: 25.4 % — ABNORMAL LOW (ref 39.0–52.0)
Hemoglobin: 8.2 g/dL — ABNORMAL LOW (ref 13.0–17.0)
Hemoglobin: 7.9 g/dL — ABNORMAL LOW (ref 13.0–17.0)
Hemoglobin: 8.4 g/dL — ABNORMAL LOW (ref 13.0–17.0)
MCH: 27.2 pg (ref 26.0–34.0)
MCH: 27.1 pg (ref 26.0–34.0)
MCH: 27.4 pg (ref 26.0–34.0)
MCHC: 33.2 g/dL (ref 30.0–36.0)
MCHC: 33.2 g/dL (ref 30.0–36.0)
MCHC: 33.1 g/dL (ref 30.0–36.0)
MCV: 82.1 fL (ref 80.0–100.0)
MCV: 81.8 fL (ref 80.0–100.0)
MCV: 82.7 fL (ref 80.0–100.0)
Platelets: 217 10*3/uL (ref 150–400)
Platelets: 209 10*3/uL (ref 150–400)
Platelets: 206 10*3/uL (ref 150–400)
RBC: 3.01 MIL/uL — ABNORMAL LOW (ref 4.22–5.81)
RBC: 2.91 MIL/uL — ABNORMAL LOW (ref 4.22–5.81)
RBC: 3.07 MIL/uL — ABNORMAL LOW (ref 4.22–5.81)
RDW: 16.4 % — ABNORMAL HIGH (ref 11.5–15.5)
RDW: 16.1 % — ABNORMAL HIGH (ref 11.5–15.5)
RDW: 16.3 % — ABNORMAL HIGH (ref 11.5–15.5)
WBC: 7.4 10*3/uL (ref 4.0–10.5)
WBC: 7.3 10*3/uL (ref 4.0–10.5)
WBC: 8 10*3/uL (ref 4.0–10.5)
nRBC: 0 % (ref 0.0–0.2)
nRBC: 0 % (ref 0.0–0.2)
nRBC: 0 % (ref 0.0–0.2)

## 2018-10-13 LAB — GLUCOSE, CAPILLARY
Glucose-Capillary: 151 mg/dL — ABNORMAL HIGH (ref 70–99)
Glucose-Capillary: 108 mg/dL — ABNORMAL HIGH (ref 70–99)
Glucose-Capillary: 114 mg/dL — ABNORMAL HIGH (ref 70–99)
Glucose-Capillary: 121 mg/dL — ABNORMAL HIGH (ref 70–99)

## 2018-10-13 LAB — BPAM RBC
Blood Product Expiration Date: 202005052359
Blood Product Expiration Date: 202005052359
ISSUE DATE / TIME: 202005011025
ISSUE DATE / TIME: 202005011831
Unit Type and Rh: 6200
Unit Type and Rh: 6200

## 2018-10-13 LAB — CREATININE, SERUM
Creatinine, Ser: 5.19 mg/dL — ABNORMAL HIGH (ref 0.61–1.24)
GFR calc Af Amer: 15 mL/min — ABNORMAL LOW (ref >60)
GFR calc non Af Amer: 13 mL/min — ABNORMAL LOW (ref >60)

## 2018-10-13 MED ORDER — FUROSEMIDE 80 MG PO TABS
80.0000 mg | ORAL_TABLET | Freq: Every day | ORAL | Status: DC
  Administered 2018-10-13 – 2018-10-14 (×2): 80 mg via ORAL
  Filled 2018-10-13 (×2): qty 1

## 2018-10-13 MED ORDER — FUROSEMIDE 80 MG PO TABS: 80.0000 mg | ORAL_TABLET | Freq: Two times a day (BID) | ORAL | Status: DC

## 2018-10-13 NOTE — Progress Notes (Addendum)
Admit: 10/06/2018 LOS: 6  41M with renal failure (unclear if acute / subacute), > 20y DM2 + DPR, Hypervolemia, anemia Hb 6.2 MCV 80; + PR3 ANCA  Subjective:  Marland Kitchen Hb stable after 2u PRBC; adequate UOP, did rec 60 IV lasix with transfusions . SCr improving, K ok . Edema is 1+ . Breathing well . Prelim Renal Bx: diabetic kidney disease, severe IF/TA, no evidence of active GN or IC based process.   05/01 0701 - 05/02 0700 In: 795 [P.O.:480; Blood:315] Out: 1900 [Urine:1900]  Filed Weights   10/11/18 0600 10/12/18 0622 10/13/18 0524  Weight: 86.9 kg 85.9 kg 85 kg    Scheduled Meds: . amLODipine  2.5 mg Oral Daily  . atorvastatin  80 mg Oral q1800  . furosemide  80 mg Oral Daily  . insulin aspart  0-5 Units Subcutaneous QHS  . insulin aspart  0-9 Units Subcutaneous TID WC   Continuous Infusions:  PRN Meds:.  Current Labs: reviewed  Results for Bill, [name] (MRN [id number]) as of 10/11/2018 11:30  Ref. Range 10/11/2018 00:44  Protein Creatinine Ratio Latest Ref Range: 0.00 - 0.15 mg/mgCre 1.83 (H)   Results for Bill, [name] (MRN [id number]) as of 10/10/2018 13:40  Ref. Range 10/07/2018 19:17  ANCA Proteinase 3 Latest Ref Range: 0.0 - 3.5 U/mL 5.9 (H)  Myeloperoxidase Abs Latest Ref Range: 0.0 - 9.0 U/mL <9.0   SPEP negative M spike  Ref. Range 10/07/2018 19:17  Kappa, lamda light chain ratio Latest Ref Range: 0.26 - 1.65  1.74 (H)    Ref. Range 10/06/2018 23:36  Saturation Ratios Latest Ref Range: 17.9 - 39.5 % 16 (L)  Ferritin Latest Ref Range: 24 - 336 ng/mL 304     Ref. Range 10/07/2018 19:17  Anti Nuclear Antibody (ANA) Latest Ref Range: Negative  Negative     Ref. Range 10/07/2018 19:17  C3 Complement Latest Ref Range: 82 - 167 mg/dL 128  Complement C4, Body Fluid Latest Ref Range: 14 - 44 mg/dL 39   Physical Exam:  Blood pressure (!) 148/80, pulse 97, temperature 99.5 F (37.5 C), temperature source Oral, resp. rate 18, height 5\' 11"  (1.803 m), weight 85 kg, SpO2  99 %. NAD, young male; lying supine appears comfortably RRR nl s1s2 no rub CTAB 1 LEE, pitting, improved NO rashes/lesions Nonfocal, AAO x3; no asterixus  A 1. Progressive Renal Failure over past 16mo, unclear if acute or subacute; sub-nephrotic proteinuria; hematuria present; On ARB at presentation 2. Perinephric hematoma following renal biopsy on 4/30 3. + PR3 ANCA, unclear if clinically relevant; status post renal biopsy 4/30 and prelim is only DN, no evidence of ANCA GN or Imm Complex disease 4. Anemia with ABLA and related to #1, transfused 2u pRBC today 5. Hypervolemia; high dietary Na intake; improving, restart today 80 PO qAM lasix 6. DM2, hx/o proliferative retinopathy, present for at least 18y 7. ANemia, mild Fe deficiency. Worsened 2/2 #2. Stable in past 24h.  CTM for now.   8. COVID neg at admission  P  . Restart lasix 80 PO qAM . If stable Hb and SCr/K in AM ok for DC . Will need close f/u at [place] . No prelim renal Bx report yet  Pearson Grippe MD 10/13/2018, 11:24 AM  Recent Labs  Lab 10/09/18 0752 10/10/18 1432 10/11/18 0501 10/12/18 0419 10/13/18 0259 10/13/18 0817  NA 140 138 139 137  --  139  K 4.2 4.1 4.3 4.1  --  4.5  CL 110 102 103 102  --  107  CO2 21* 25 25 22   --  23  GLUCOSE 119* 149* 118* 129*  --  142*  BUN 45* 55* 58* 69*  --  68*  CREATININE 3.66* 4.42* 4.65* 5.52* 5.19* 4.87*  CALCIUM 9.0 9.0 9.1 8.5*  --  8.8*  PHOS 5.4* 5.6*  --   --   --  4.5   Recent Labs  Lab 10/06/18 1358  10/12/18 2355 10/13/18 0259 10/13/18 0626  WBC 6.3   < > 7.4 7.3 8.0  NEUTROABS 4.8  --   --   --   --   HGB 6.2*   < > 8.2* 7.9* 8.4*  HCT 20.5*   < > 24.7* 23.8* 25.4*  MCV 80.4   < > 82.1 81.8 82.7  PLT 240   < > 217 209 206   < > = values in this interval not displayed.

## 2018-10-13 NOTE — Progress Notes (Signed)
PROGRESS NOTE    Bill [name]  FTD:[id number] DOB: 1980/06/16 DOA: 10/06/2018 PCP: Patient, No Pcp Per    Brief Narrative:  38 year old male with past medical history significant for nonhemorrhagic stroke, type 2 diabetes, hypertension who initially presented to the urgent care with shortness of breath that is started in night prior to presentation.  He denies fever but he report occasional dry cough.  Patient denies melena hematochezia.  He report worsening lower extremity edema since Wednesday prior to admission.  He also reports orthopnea.  COVID 19 test was sent out from urgent care center.  Pending results at this time. With severe anemia with a hemoglobin at 6, Acute renal failure with a creatinine at 3.5. Status post right kidney biopsy with postbiopsy hematoma   Assessment & Plan:   Principal Problem:   AKI (acute kidney injury) ([place]) Active Problems:   Type 2 diabetes mellitus with vascular disease (HCC)   Anemia   Perinephric hematoma/Rt Sided Post Renal Biopsy   Acute on chronic blood loss anemia/ Post Renal Biopsy   Diabetes 1.5, managed as type 2 ([place])   HTN (hypertension)   (HFpEF) heart failure with preserved ejection fraction (HCC)  1)Perinephric Hematoma---- s/p Renal biopsy on 0/62/37 complicated by perinephric hematoma requiring Gelfoam injection, hemoglobin is up above 8 from 5.9 after transfusion of 2 units of packed cells on 10/12/2018,  follow-up renal ultrasound on 10/12/2018 without evidence of active bleeding discussed with IR attending Dr. Markus Daft and Nephrologist Dr. Joelyn Oms  2)HFpEF/Dyspnea --- much improved with diuretics, echo from 10/08/2018 with EF of 55 to 60%, most likely due to fluid overload rather than pulmonary infection, COVID 19 was Rule out; Test Negative X 2, one Neg CoVID test from [facility] urgent care center.  [place] [facility] will arrange outpatient follow-up.Marland KitchenMarland KitchenLE edema is improving with diuresis, lower extremity venous Dopplers negative  for DVT  3)Acute on chronic Anemia----underlying anemia of CKD compounded by acute blood loss secondary to right-sided perinephric hematoma after kidney biopsy on 10/11/2018, ,  hemoglobin is up above 8 from 5.9 after transfusion of 2 units of packed cells on 10/12/2018,  previously received IV iron and Aranesp, Iron level normal at 46, ferritin 628, folic acid 13 vitamin B 500. Patient denies any history of melena, hematochezia, hematemesis.  4)AKI Vs acute/CKD----longstanding history of diabetes and hypertension, PR3 ANCA weakly positive at 5.9, ANA negative, s/p renal biopsy by IR on 10/11/2018 Creatinine 8 months ago was at 1.1. He presents with creatinine at 3.5, creatinine continues to trend up [place] negative for hydronephrosis, shows chronic medical diseases.  Nephrology consult appreciated  UPEP SPEP.:  Kappa light chain 126, Lamda 72,  Complement normal.  Proteinuria.  Diuretics as per nephrology team  5-HTN-    BP improving, continue amlodipine  2.5 mg daily   6-DM type 2; Continue SSI.  A1c 6.4 reflecting excellent control, discontinued  metformin due to kidney concerns.   Disposition--- Patient continues to have some dyspnea on exertion and orthostatic dizziness persist, nephrologist advised additional day of diuresis as an inpatient with close monitoring of renal function and hemodynamics .... so nephrologist advised that we hold discharge on 10/13/2018 with possible discharge home on 10/14/2018   DVT prophylaxis: SCD no anticoagulation due to anemia.  Code Status: full code.  Family Communication: mother over phone Disposition Plan: remain in the hospital for treatment of renal failure, anemia-renal perinephric hematoma after Renal biopsy on 10/11/2018  Consultants:   Nephrology  IR renal biopsy  Procedures:  Rt renal biopsy on 10/11/2018 by IR   Antimicrobials:   none   Subjective: Resting comfortably, eating and drinking well, voiding okay,  Some dyspnea on exertion and  orthostatic dizziness persist   Objective: Vitals:   10/12/18 2208 10/13/18 0033 10/13/18 0524 10/13/18 0743  BP: (!) 129/111 (!) 164/84  (!) 148/80  Pulse: 96 95  97  Resp:  18    Temp: 98.7 F (37.1 C) 99.9 F (37.7 C)  99.5 F (37.5 C)  TempSrc: Oral Oral  Oral  SpO2: 100% 99%  99%  Weight:   85 kg   Height:        Intake/Output Summary (Last 24 hours) at 10/13/2018 1611 Last data filed at 10/13/2018 0526 Gross per 24 hour  Intake -  Output 1100 ml  Net -1100 ml   Filed Weights   10/11/18 0600 10/12/18 0622 10/13/18 0524  Weight: 86.9 kg 85.9 kg 85 kg    Physical Exam  Patient is examined daily including today on 10/13/18, exams remain the same as of yesterday except that has changed   Gen:- Awake Alert,  In no apparent distress  HEENT:- [place].AT, No sclera icterus Neck-Supple Neck,No JVD,.  Lungs-diminished in bases with fine bibasilar rales CV- S1, S2 normal Abd-  +ve B.Sounds, Abd Soft, No tenderness,    Extremity/Skin:- 1 + pitting edema, good pedal pulses Psych-affect is appropriate, oriented x3 Neuro-no new focal deficits, no tremors MSK--- right lower back with dressing over right kidney biopsy site  CBC: Recent Labs  Lab 10/11/18 1818 10/12/18 0419 10/12/18 2355 10/13/18 0259 10/13/18 0626  WBC 7.5 7.4 7.4 7.3 8.0  HGB 6.9* 5.9* 8.2* 7.9* 8.4*  HCT 21.4* 18.5* 24.7* 23.8* 25.4*  MCV 79.6* 79.7* 82.1 81.8 82.7  PLT 235 220 217 209 384   Basic Metabolic Panel: Recent Labs  Lab 10/09/18 0752 10/10/18 1432 10/11/18 0501 10/12/18 0419 10/13/18 0259 10/13/18 0817  NA 140 138 139 137  --  139  K 4.2 4.1 4.3 4.1  --  4.5  CL 110 102 103 102  --  107  CO2 21* 25 25 22   --  23  GLUCOSE 119* 149* 118* 129*  --  142*  BUN 45* 55* 58* 69*  --  68*  CREATININE 3.66* 4.42* 4.65* 5.52* 5.19* 4.87*  CALCIUM 9.0 9.0 9.1 8.5*  --  8.8*  PHOS 5.4* 5.6*  --   --   --  4.5   GFR: Estimated Creatinine Clearance: 21.7 mL/min (A) (by C-G formula based on SCr  of 4.87 mg/dL (H)). Liver Function Tests: Recent Labs  Lab 10/09/18 0752 10/10/18 1432 10/13/18 0817  ALBUMIN 2.8* 3.0* 2.8*   No results for input(s): LIPASE, AMYLASE in the last 168 hours. No results for input(s): AMMONIA in the last 168 hours. Coagulation Profile: Recent Labs  Lab 10/10/18 1432  INR 1.1   Cardiac Enzymes: No results for input(s): CKTOTAL, CKMB, CKMBINDEX, TROPONINI in the last 168 hours. BNP (last 3 results) No results for input(s): PROBNP in the last 8760 hours. HbA1C: No results for input(s): HGBA1C in the last 72 hours. CBG: Recent Labs  Lab 10/12/18 0850 10/12/18 1815 10/12/18 2218 10/13/18 0741 10/13/18 1141  GLUCAP 115* 163* 168* 151* 108*   Lipid Profile: No results for input(s): CHOL, HDL, LDLCALC, TRIG, CHOLHDL, LDLDIRECT in the last 72 hours. Thyroid Function Tests: No results for input(s): TSH, T4TOTAL, FREET4, T3FREE, THYROIDAB in the last 72 hours. Anemia Panel: No results for input(s):  VITAMINB12, FOLATE, FERRITIN, TIBC, IRON, RETICCTPCT in the last 72 hours. Sepsis Labs: No results for input(s): PROCALCITON, LATICACIDVEN in the last 168 hours.  Recent Results (from the past 240 hour(s))  SARS Coronavirus 2 [facility] order, Performed in [facility] hospital lab)     Status: None   Collection Time: 10/07/18  2:32 PM  Result Value Ref Range Status   SARS Coronavirus 2 NEGATIVE NEGATIVE Final    Comment: (NOTE) If result is NEGATIVE SARS-CoV-2 target nucleic acids are NOT DETECTED. The SARS-CoV-2 RNA is generally detectable in upper and lower  respiratory specimens during the acute phase of infection. The lowest  concentration of SARS-CoV-2 viral copies this assay can detect is 250  copies / mL. A negative result does not preclude SARS-CoV-2 infection  and should not be used as the sole basis for treatment or other  patient management decisions.  A negative result may occur with  improper specimen collection / handling, submission  of specimen other  than nasopharyngeal swab, presence of viral mutation(s) within the  areas targeted by this assay, and inadequate number of viral copies  (<250 copies / mL). A negative result must be combined with clinical  observations, patient history, and epidemiological information. If result is POSITIVE SARS-CoV-2 target nucleic acids are DETECTED. The SARS-CoV-2 RNA is generally detectable in upper and lower  respiratory specimens dur ing the acute phase of infection.  Positive  results are indicative of active infection with SARS-CoV-2.  Clinical  correlation with patient history and other diagnostic information is  necessary to determine patient infection status.  Positive results do  not rule out bacterial infection or co-infection with other viruses. If result is PRESUMPTIVE POSTIVE SARS-CoV-2 nucleic acids MAY BE PRESENT.   A presumptive positive result was obtained on the submitted specimen  and confirmed on repeat testing.  While 2019 novel coronavirus  (SARS-CoV-2) nucleic acids may be present in the submitted sample  additional confirmatory testing may be necessary for epidemiological  and / or clinical management purposes  to differentiate between  SARS-CoV-2 and other Sarbecovirus currently known to infect humans.  If clinically indicated additional testing with an alternate test  methodology [phone]) is advised. The SARS-CoV-2 RNA is generally  detectable in upper and lower respiratory sp ecimens during the acute  phase of infection. The expected result is Negative. Fact Sheet for Patients:  StrictlyIdeas.no Fact Sheet for Healthcare Providers: BankingDealers.co.za This test is not yet approved or cleared by the [place] FDA and has been authorized for detection and/or diagnosis of SARS-CoV-2 by FDA under an Emergency Use Authorization (EUA).  This EUA will remain in effect (meaning this test can be used) for  the duration of the COVID-19 declaration under Section 564(b)(1) of the Act, 21 U.S.C. section 360bbb-3(b)(1), unless the authorization is terminated or revoked sooner. Performed at [facility], [place] [address], [place], [place] [address]      Radiology Studies: US Renal  Result Date: 10/12/2018 CLINICAL DATA:  Hematoma right kidney after biopsy yesterday EXAM: RENAL / URINARY TRACT ULTRASOUND COMPLETE COMPARISON:  10/11/2018 FINDINGS: Bilateral echogenic renal cortex consistent with medical renal disease. Heterogeneous material about the lower pole right kidney measuring 7.8 x 3.8 x 3.6 cm. There is been no convincing change from prior when allowing for irregular shape and inconsistency of ultrasound planes. There may has also been change in the clot character. There is internal echogenic material with ring down artifact, likely air or Gelfoam. No hydronephrosis. IMPRESSION: 1. No  suspected change in the right perinephric hematoma. No new abnormality. 2. Medical renal disease. Electronically Signed   By: Monte Fantasia M.D.   On: 10/12/2018 11:57    Scheduled Meds: . amLODipine  2.5 mg Oral Daily  . atorvastatin  80 mg Oral q1800  . furosemide  80 mg Oral Daily  . insulin aspart  0-5 Units Subcutaneous QHS  . insulin aspart  0-9 Units Subcutaneous TID WC   Continuous Infusions:   LOS: 6 days   Roxan Hockey, MD [facility]  If 7PM-7AM, please contact night-coverage [email or web address] Password [email or web address] 10/13/2018, 4:11 PM

## 2018-10-13 NOTE — Progress Notes (Signed)
Referring Physician(s): Dr Alfonso Ellis  Supervising Physician: Markus Daft  Patient Status:  [facility] - In-pt  Chief Complaint:  Random renal biopsy 4/30 in Radiology Post small perinephric hemorrhage    Subjective:  [place] yesterday:  No evidence of expansion  Pt up in chair; ambulating in room Denies pain; sob  Hgb up to 8.4  Allergies: Patient has no known allergies.  Medications:  Current Facility-Administered Medications:    amLODipine (NORVASC) tablet 2.5 mg, 2.5 mg, Oral, Daily, Emokpae, Courage, MD, 2.5 mg at 10/13/18 0849   atorvastatin (LIPITOR) tablet 80 mg, 80 mg, Oral, q1800, Hosie Poisson, MD, 80 mg at 10/12/18 2225   furosemide (LASIX) tablet 80 mg, 80 mg, Oral, Daily, Sanford, Ryan B, MD   insulin aspart (novoLOG) injection 0-5 Units, 0-5 Units, Subcutaneous, QHS, Akula, Vijaya, MD   insulin aspart (novoLOG) injection 0-9 Units, 0-9 Units, Subcutaneous, TID WC, Hosie Poisson, MD, 2 Units at 10/13/18 0849    Vital Signs: BP (!) 148/80    Pulse 97    Temp 99.5 F (37.5 C) (Oral)    Resp 18    Ht 5\' 11"  (1.803 m)    Wt 85 kg    SpO2 99%    BMI 26.14 kg/m   Physical Exam Vitals signs reviewed.  Musculoskeletal: Normal range of motion.  Skin:    General: Skin is warm and dry.     Comments: Site of Bx Rt flank is NT  No bleeding No hematoma  Neurological:     Mental Status: He is alert and oriented to person, place, and time.  Psychiatric:        Behavior: Behavior normal.     Imaging: US Renal  Result Date: 10/12/2018 CLINICAL DATA:  Hematoma right kidney after biopsy yesterday EXAM: RENAL / URINARY TRACT ULTRASOUND COMPLETE COMPARISON:  10/11/2018 FINDINGS: Bilateral echogenic renal cortex consistent with medical renal disease. Heterogeneous material about the lower pole right kidney measuring 7.8 x 3.8 x 3.6 cm. There is been no convincing change from prior when allowing for irregular shape and inconsistency of ultrasound planes. There may has  also been change in the clot character. There is internal echogenic material with ring down artifact, likely air or Gelfoam. No hydronephrosis. IMPRESSION: 1. No suspected change in the right perinephric hematoma. No new abnormality. 2. Medical renal disease. Electronically Signed   By: Monte Fantasia M.D.   On: 10/12/2018 11:57   US Renal  Result Date: 10/11/2018 CLINICAL DATA:  38 year old with renal failure and recent right renal biopsy. Patient developed a perinephric hematoma immediately following the renal biopsy. Decreased hemoglobin since the biopsy and need to follow-up perinephric hematoma. EXAM: RENAL / URINARY TRACT ULTRASOUND COMPLETE COMPARISON:  10/07/1998 20.  Biopsy images from 10/11/2018 FINDINGS: Right Kidney: Renal measurements: 12.5 x 6.1 x 7.3 cm = volume: 295 mL. Slightly increased echogenicity in right kidney is similar to the prior diagnostic examination. There is now a small amount of fluid around the kidney. No evidence for active bleeding based on the color Doppler images. Complex collection along the inferior aspect of the right kidney is compatible with the known hematoma. There is echogenic material within this hematoma compatible with the recently injected Gel-Foam. The hematoma roughly measures 4.8 x 3.6 x 4.2 cm. Negative for right hydronephrosis. No significant compression on the right kidney. Left Kidney: Renal measurements: 11.4 x 6.4 x 5.3 cm = volume: 203 mL. Slightly increased echogenicity is similar to the previous examination. Negative for left  hydronephrosis. Bladder: Appears normal for degree of bladder distention. Right ureter jet is present. IMPRESSION: 1. Small perinephric hematoma associated with the right kidney lower pole. Echogenic material within the hematoma is compatible with the a Gel-Foam injected during the biopsy procedure. No evidence for active bleeding from the right kidney lower pole. 2. Negative for hydronephrosis. 3. Stable appearance of left  kidney without hydronephrosis. Electronically Signed   By: Markus Daft M.D.   On: 10/11/2018 14:59   US Biopsy (kidney)  Addendum Date: 10/11/2018   ADDENDUM REPORT: 10/11/2018 14:51 ADDENDUM: Correction to the report: Complication: Small perinephric hematoma. Gel-Foam was injected within the perinephric hematoma. Patient was hemodynamically stable during and after the procedure. SIR level B: Nominal therapy (including overnight admission for observation), no consequence. Electronically Signed   By: Markus Daft M.D.   On: 10/11/2018 14:51   Result Date: 10/11/2018 INDICATION: 19 year old with acute kidney injury and request for random renal biopsy. EXAM: ULTRASOUND-GUIDED RANDOM RENAL BIOPSY MEDICATIONS: None. ANESTHESIA/SEDATION: Fentanyl 100 mcg IV; Versed 1.5 mg IV Moderate Sedation Time:  36 minutes The patient was continuously monitored during the procedure by the interventional radiology nurse under my direct supervision. FLUOROSCOPY TIME:  None COMPLICATIONS: None immediate. PROCEDURE: Informed written consent was obtained from the patient after a thorough discussion of the procedural risks, benefits and alternatives. All questions were addressed. Maximal Sterile Barrier Technique was utilized including mask, sterile gowns, sterile gloves, sterile drape, hand hygiene and skin antiseptic. A timeout was performed prior to the initiation of the procedure. Patient was placed prone. Both kidneys were evaluated with ultrasound. The right kidney was selected for biopsy. Right flank was prepped with chlorhexidine and sterile field was created. Skin and soft tissues were anesthetized with 1% lidocaine. Using ultrasound guidance, 16 gauge core needle was directed into the right kidney lower pole from a posteromedial approach. Total of 2 core biopsies were obtained and specimens were placed in saline. Small perinephric hematoma was immediately identified following the second core biopsy. Blood flow was identified  within the perinephric hematoma. Therefore, Gelfoam plugs and Gel-Foam slurry was injected into this perinephric hematoma. Bandage placed over the puncture site. Patient was very anxious after the procedure and could not tolerate lying prone. Therefore, the patient was placed on his back with head slightly elevated. FINDINGS: Negative for hydronephrosis. Two core biopsies obtained from the right kidney lower pole. Adequate specimens were obtained. Small perinephric hematoma was immediately identified following the second core biopsy. The hematoma slowly enlarging after the procedure. There was blood flow within the perinephric hematoma. Therefore, Gel-Foam was injected into the perinephric hematoma. At the end of the procedure, there was no significant blood flow within the perinephric hematoma but evidence for a small amount of bleeding along the needle track within the right kidney lower pole. IMPRESSION: 1. Ultrasound-guided core biopsy of the right kidney lower pole. 2. Procedure was complicated by a post biopsy perinephric hematoma. Gel-Foam was injected in the perinephric hematoma. Patient was hemodynamically stable during and after the procedure. Plan for strict bed rest today and will get follow-up CBC. Procedure and plan discussed with Dr. Denton Brick. Electronically Signed: By: Markus Daft M.D. On: 10/11/2018 10:27    Labs:  CBC: Recent Labs    10/12/18 0419 10/12/18 2355 10/13/18 0259 10/13/18 0626  WBC 7.4 7.4 7.3 8.0  HGB 5.9* 8.2* 7.9* 8.4*  HCT 18.5* 24.7* 23.8* 25.4*  PLT 220 217 209 206    COAGS: Recent Labs    01/24/18 1807  10/10/18 1432  INR 0.95 1.1  APTT  --  36    BMP: Recent Labs    10/10/18 1432 10/11/18 0501 10/12/18 0419 10/13/18 0259 10/13/18 0817  NA 138 139 137  --  139  K 4.1 4.3 4.1  --  4.5  CL 102 103 102  --  107  CO2 25 25 22   --  23  GLUCOSE 149* 118* 129*  --  142*  BUN 55* 58* 69*  --  68*  CALCIUM 9.0 9.1 8.5*  --  8.8*  CREATININE 4.42*  4.65* 5.52* 5.19* 4.87*  GFRNONAA 16* 15* 12* 13* 14*  GFRAA 18* 17* 14* 15* 16*    LIVER FUNCTION TESTS: Recent Labs    01/24/18 1807 01/25/18 0400 10/06/18 1358 10/09/18 0752 10/10/18 1432 10/13/18 0817  BILITOT 0.6 0.7 0.5  --   --   --   AST 14* 15 27  --   --   --   ALT 13 13 27   --   --   --   ALKPHOS 39 37* 59  --   --   --   PROT 6.4* 6.3* 7.3  --   --   --   ALBUMIN 3.2* 2.9* 3.0* 2.8* 3.0* 2.8*    Assessment and Plan:  Post perinephric hemorrhage post random renal biopsy 4/30 Hgb up to 8.4. Hemodynamically stable, US shows no growth of hematoma. Asymptomatic pt Will follow with Dr Joelyn Oms and Dr Denton Brick Will inform Dr Anselm Pancoast  Electronically Signed: Ascencion Dike, PA-C 10/13/2018, 12:24 PM   I spent a total of 15 Minutes at the the patient's bedside AND on the patient's hospital floor or unit, greater than 50% of which was counseling/coordinating care for random renal bx

## 2018-10-14 LAB — CBC
HCT: 25.7 % — ABNORMAL LOW (ref 39.0–52.0)
Hemoglobin: 8.3 g/dL — ABNORMAL LOW (ref 13.0–17.0)
MCH: 26.5 pg (ref 26.0–34.0)
MCHC: 32.3 g/dL (ref 30.0–36.0)
MCV: 82.1 fL (ref 80.0–100.0)
Platelets: 238 10*3/uL (ref 150–400)
RBC: 3.13 MIL/uL — ABNORMAL LOW (ref 4.22–5.81)
RDW: 16.4 % — ABNORMAL HIGH (ref 11.5–15.5)
WBC: 7.1 10*3/uL (ref 4.0–10.5)
nRBC: 0 % (ref 0.0–0.2)

## 2018-10-14 LAB — BASIC METABOLIC PANEL
Anion gap: 11 (ref 5–15)
BUN: 64 mg/dL — ABNORMAL HIGH (ref 6–20)
CO2: 24 mmol/L (ref 22–32)
Calcium: 9.1 mg/dL (ref 8.9–10.3)
Chloride: 104 mmol/L (ref 98–111)
Creatinine, Ser: 4.44 mg/dL — ABNORMAL HIGH (ref 0.61–1.24)
GFR calc Af Amer: 18 mL/min — ABNORMAL LOW (ref >60)
GFR calc non Af Amer: 16 mL/min — ABNORMAL LOW (ref >60)
Glucose, Bld: 131 mg/dL — ABNORMAL HIGH (ref 70–99)
Potassium: 4.2 mmol/L (ref 3.5–5.1)
Sodium: 139 mmol/L (ref 135–145)

## 2018-10-14 LAB — GLUCOSE, CAPILLARY
Glucose-Capillary: 113 mg/dL — ABNORMAL HIGH (ref 70–99)
Glucose-Capillary: 104 mg/dL — ABNORMAL HIGH (ref 70–99)

## 2018-10-14 MED ORDER — ASPIRIN 81 MG PO TBEC: 81.0000 mg | DELAYED_RELEASE_TABLET | Freq: Every day | ORAL | 11 refills | Status: DC

## 2018-10-14 MED ORDER — FUROSEMIDE 80 MG PO TABS: 80.0000 mg | ORAL_TABLET | Freq: Every day | ORAL | 2 refills | Status: DC

## 2018-10-14 MED ORDER — AMLODIPINE BESYLATE 10 MG PO TABS: 10.0000 mg | ORAL_TABLET | Freq: Every day | ORAL | 11 refills | Status: DC

## 2018-10-14 MED ORDER — GLIPIZIDE 5 MG PO TABS: 2.5000 mg | ORAL_TABLET | Freq: Two times a day (BID) | ORAL | 2 refills | Status: DC

## 2018-10-14 MED ORDER — ATORVASTATIN CALCIUM 80 MG PO TABS: 80.0000 mg | ORAL_TABLET | Freq: Every evening | ORAL | 12 refills | Status: DC

## 2018-10-14 NOTE — Discharge Instructions (Signed)
1)Avoid ibuprofen/Advil/Aleve/Motrin/Goody Powders/Naproxen/BC powders/Meloxicam/Diclofenac/Indomethacin and other Nonsteroidal anti-inflammatory medications as these will make you more likely to bleed and can cause stomach ulcers, can also cause Kidney problems.  2)STOP Losartan and STOP HCTZ/Hydrochlorthiazide due to kidney concerns 3)STOP Metformin due to kidney concerns 4)Take Glipizide 2.5 mg twice a day with meals for Diabetes----Hold if you Skip a Meal 5)Return to work on Tuesday, 10/16/2018 6) follow-up with Dr. Royce Macadamia nephrologist at [facility] within a week 7) repeat CBC/complete blood count and BMP/kidney and electrolyte test within a week   Acute Kidney Injury, Adult  Acute kidney injury is a sudden worsening of kidney function. The kidneys are organs that have several jobs. They filter the blood to remove waste products and extra fluid. They also maintain a healthy balance of minerals and hormones in the body, which helps control blood pressure and keep bones strong. With this condition, your kidneys do not do their jobs as well as they should. This condition ranges from mild to severe. Over time it may develop into long-lasting (chronic) kidney disease. Early detection and treatment may prevent acute kidney injury from developing into a chronic condition. What are the causes? Common causes of this condition include:  A problem with blood flow to the kidneys. This may be caused by: ? Low blood pressure (hypotension) or shock. ? Blood loss. ? Heart and blood vessel (cardiovascular) disease. ? Severe burns. ? Liver disease.  Direct damage to the kidneys. This may be caused by: ? Certain medicines. ? A kidney infection. ? Poisoning. ? Being around or in contact with toxic substances. ? A surgical wound. ? A hard, direct hit to the kidney area.  A sudden blockage of urine flow. This may be caused by: ? Cancer. ? Kidney stones. ? An enlarged prostate in  males. What are the signs or symptoms? Symptoms of this condition may not be obvious until the condition becomes severe. Symptoms of this condition can include:  Tiredness (lethargy), or difficulty staying awake.  Nausea or vomiting.  Swelling (edema) of the face, legs, ankles, or feet.  Problems with urination, such as: ? Abdominal pain, or pain along the side of your stomach (flank). ? Decreased urine production. ? Decrease in the force of urine flow.  Muscle twitches and cramps, especially in the legs.  Confusion or trouble concentrating.  Loss of appetite.  Fever. How is this diagnosed? This condition may be diagnosed with tests, including:  Blood tests.  Urine tests.  Imaging tests.  A test in which a sample of tissue is removed from the kidneys to be examined under a microscope (kidney biopsy). How is this treated? Treatment for this condition depends on the cause and how severe the condition is. In mild cases, treatment may not be needed. The kidneys may heal on their own. In more severe cases, treatment will involve:  Treating the cause of the kidney injury. This may involve changing any medicines you are taking or adjusting your dosage.  Fluids. You may need specialized IV fluids to balance your body's needs.  Having a catheter placed to drain urine and prevent blockages.  Preventing problems from occurring. This may mean avoiding certain medicines or procedures that can cause further injury to the kidneys. In some cases treatment may also require:  A procedure to remove toxic wastes from the body (dialysis or continuous renal replacement therapy - CRRT).  Surgery. This may be done to repair a torn kidney, or to remove the blockage from the urinary system.  Follow these instructions at home: Medicines  Take over-the-counter and prescription medicines only as told by your health care provider.  Do not take any new medicines without your health care  provider's approval. Many medicines can worsen your kidney damage.  Do not take any vitamin and mineral supplements without your health care provider's approval. Many nutritional supplements can worsen your kidney damage. Lifestyle  If your health care provider prescribed changes to your diet, follow them. You may need to decrease the amount of protein you eat.  Achieve and maintain a healthy weight. If you need help with this, ask your health care provider.  Start or continue an exercise plan. Try to exercise at least 30 minutes a day, 5 days a week.  Do not use any tobacco products, such as cigarettes, chewing tobacco, and e-cigarettes. If you need help quitting, ask your health care provider. General instructions  Keep track of your blood pressure. Report changes in your blood pressure as told by your health care provider.  Stay up to date with immunizations. Ask your health care provider which immunizations you need.  Keep all follow-up visits as told by your health care provider. This is important. Where to find more information  American Association of Kidney Patients: BombTimer.gl  National Kidney Foundation: www.kidney.[place]: https://mathis.com/  [facility]: ? www.lifeoptions.org ? www.kidneyschool.org Contact a health care provider if:  Your symptoms get worse.  You develop new symptoms. Get help right away if:  You develop symptoms of worsening kidney disease, which include: ? Headaches. ? Abnormally dark or light skin. ? Easy bruising. ? Frequent hiccups. ? Chest pain. ? Shortness of breath. ? End of menstruation in women. ? Seizures. ? Confusion or altered mental status. ? Abdominal or back pain. ? Itchiness.  You have a fever.  Your body is producing less urine.  You have pain or bleeding when you urinate. Summary  Acute kidney injury is a sudden worsening of kidney function.  Acute kidney injury can be  caused by problems with blood flow to the kidneys, direct damage to the kidneys, and sudden blockage of urine flow.  Symptoms of this condition may not be obvious until it becomes severe. Symptoms may include edema, lethargy, confusion, nausea or vomiting, and problems passing urine.  This condition can usually be diagnosed with blood tests, urine tests, and imaging tests. Sometimes a kidney biopsy is done to diagnose this condition.  Treatment for this condition often involves treating the underlying cause. It is treated with fluids, medicines, dialysis, diet changes, or surgery. This information is not intended to replace advice given to you by your health care provider. Make sure you discuss any questions you have with your health care provider. Document Released: 12/13/2010 Document Revised: 09/29/2016 Document Reviewed: 05/20/2016 Elsevier Interactive Patient Education  2019 [place].   Anemia  Anemia is a condition in which you do not have enough red blood cells or hemoglobin. Hemoglobin is a substance in red blood cells that carries oxygen. When you do not have enough red blood cells or hemoglobin (are anemic), your body cannot get enough oxygen and your organs may not work properly. As a result, you may feel very tired or have other problems. What are the causes? Common causes of anemia include:  Excessive bleeding. Anemia can be caused by excessive bleeding inside or outside the body, including bleeding from the intestine or from periods in women.  Poor nutrition.  Long-lasting (chronic) kidney, thyroid, and liver  disease.  Bone marrow disorders.  Cancer and treatments for cancer.  HIV (human immunodeficiency virus) and AIDS (acquired immunodeficiency syndrome).  Treatments for HIV and AIDS.  Spleen problems.  Blood disorders.  Infections, medicines, and autoimmune disorders that destroy red blood cells. What are the signs or symptoms? Symptoms of this condition  include:  Minor weakness.  Dizziness.  Headache.  Feeling heartbeats that are irregular or faster than normal (palpitations).  Shortness of breath, especially with exercise.  Paleness.  Cold sensitivity.  Indigestion.  Nausea.  Difficulty sleeping.  Difficulty concentrating. Symptoms may occur suddenly or develop slowly. If your anemia is mild, you may not have symptoms. How is this diagnosed? This condition is diagnosed based on:  Blood tests.  Your medical history.  A physical exam.  Bone marrow biopsy. Your health care provider may also check your stool (feces) for blood and may do additional testing to look for the cause of your bleeding. You may also have other tests, including:  Imaging tests, such as a CT scan or MRI.  Endoscopy.  Colonoscopy. How is this treated? Treatment for this condition depends on the cause. If you continue to lose a lot of blood, you may need to be treated at a hospital. Treatment may include:  Taking supplements of iron, vitamin W86, or folic acid.  Taking a hormone medicine (erythropoietin) that can help to stimulate red blood cell growth.  Having a blood transfusion. This may be needed if you lose a lot of blood.  Making changes to your diet.  Having surgery to remove your spleen. Follow these instructions at home:  Take over-the-counter and prescription medicines only as told by your health care provider.  Take supplements only as told by your health care provider.  Follow any diet instructions that you were given.  Keep all follow-up visits as told by your health care provider. This is important. Contact a health care provider if:  You develop new bleeding anywhere in the body. Get help right away if:  You are very weak.  You are short of breath.  You have pain in your abdomen or chest.  You are dizzy or feel faint.  You have trouble concentrating.  You have bloody or black, tarry stools.  You vomit  repeatedly or you vomit up blood. Summary  Anemia is a condition in which you do not have enough red blood cells or enough of a substance in your red blood cells that carries oxygen (hemoglobin).  Symptoms may occur suddenly or develop slowly.  If your anemia is mild, you may not have symptoms.  This condition is diagnosed with blood tests as well as a medical history and physical exam. Other tests may be needed.  Treatment for this condition depends on the cause of the anemia. This information is not intended to replace advice given to you by your health care provider. Make sure you discuss any questions you have with your health care provider.  1)Avoid ibuprofen/Advil/Aleve/Motrin/Goody Powders/Naproxen/BC powders/Meloxicam/Diclofenac/Indomethacin and other Nonsteroidal anti-inflammatory medications as these will make you more likely to bleed and can cause stomach ulcers, can also cause Kidney problems.  2)STOP Losartan and STOP HCTZ/Hydrochlorthiazide due to kidney concerns 3)STOP Metformin due to kidney concerns 4)Take Glipizide 2.5 mg twice a day with meals for Diabetes----Hold if you Skip a Meal 5)Return to work on Tuesday, 10/16/2018 6) follow-up with Dr. Royce Macadamia nephrologist at [facility] within a week 7) repeat CBC/complete blood count and BMP/kidney and electrolyte test within a week  Document Released: 07/07/2004 Document Revised: 07/01/2016 Document Reviewed: 07/01/2016 Elsevier Interactive Patient Education  2019 [employer].

## 2018-10-14 NOTE — Progress Notes (Signed)
Admit: 10/06/2018 LOS: 7  60M with renal failure (unclear if acute / subacute), > 20y DM2 + DPR, Hypervolemia, anemia Hb 6.2 MCV 80; + PR3 ANCA  Subjective:   . Stable Hb . Downtrending SCr . Good UOP on qAM lasix . Swelling is stable, much improved from previous . Discussed: Prelim Renal Bx: diabetic kidney disease, severe IF/TA, no evidence of active GN or IC based process.   05/02 0701 - 05/03 0700 In: 240 [P.O.:240] Out: 1700 [Urine:1700]  Filed Weights   10/11/18 0600 10/12/18 0622 10/13/18 0524  Weight: 86.9 kg 85.9 kg 85 kg    Scheduled Meds: . amLODipine  2.5 mg Oral Daily  . atorvastatin  80 mg Oral q1800  . furosemide  80 mg Oral Daily  . insulin aspart  0-5 Units Subcutaneous QHS  . insulin aspart  0-9 Units Subcutaneous TID WC   Continuous Infusions:  PRN Meds:.  Current Labs: reviewed  Results for TALVIN, Bill [name] (MRN [id number]) as of 10/11/2018 11:30  Ref. Range 10/11/2018 00:44  Protein Creatinine Ratio Latest Ref Range: 0.00 - 0.15 mg/mgCre 1.83 (H)   Results for MAXWELL, Bill [name] (MRN [id number]) as of 10/10/2018 13:40  Ref. Range 10/07/2018 19:17  ANCA Proteinase 3 Latest Ref Range: 0.0 - 3.5 U/mL 5.9 (H)  Myeloperoxidase Abs Latest Ref Range: 0.0 - 9.0 U/mL <9.0   SPEP negative M spike  Ref. Range 10/07/2018 19:17  Kappa, lamda light chain ratio Latest Ref Range: 0.26 - 1.65  1.74 (H)    Ref. Range 10/06/2018 23:36  Saturation Ratios Latest Ref Range: 17.9 - 39.5 % 16 (L)  Ferritin Latest Ref Range: 24 - 336 ng/mL 304     Ref. Range 10/07/2018 19:17  Anti Nuclear Antibody (ANA) Latest Ref Range: Negative  Negative     Ref. Range 10/07/2018 19:17  C3 Complement Latest Ref Range: 82 - 167 mg/dL 128  Complement C4, Body Fluid Latest Ref Range: 14 - 44 mg/dL 39   Physical Exam:  Blood pressure (!) 162/87, pulse 88, temperature 98.2 F (36.8 C), temperature source Oral, resp. rate 18, height 5\' 11"  (1.803 m), weight 85 kg, SpO2 100 %. NAD, young male;  lying supine appears comfortably RRR nl s1s2 no rub CTAB 1 LEE, pitting, improved NO rashes/lesions Nonfocal, AAO x3; no asterixus  A 1. Progressive Renal Failure over past 31mo, unclear if acute or subacute; sub-nephrotic proteinuria; hematuria present; On ARB at presentation 2. Perinephric hematoma following renal biopsy on 4/30 3. + PR3 ANCA, unclear if clinically relevant; status post renal biopsy 4/30 and prelim is only DN, no evidence of ANCA GN or Imm Complex disease 4. Anemia with ABLA and related to #1, stable; s/p ESA 5. Hypervolemia; high dietary Na intake; stable, cont 80 PO qAM lasix can do 2nd dose in PM prn 6. DM2, hx/o proliferative retinopathy, present for at least 18y 7. ANemia, mild Fe deficiency. Worsened 2/2 #2. Stable in past 24h.  CTM for now.   8. COVID neg at admission  P  . Will f/u at [facility] with Dr. Royce Macadamia . DC on lasix 80 mg po qAM and optional prn 2nd dose in pM for swelling . OK for DC  Pearson Grippe MD 10/14/2018, 10:46 AM  Recent Labs  Lab 10/09/18 0752 10/10/18 1432  10/12/18 0419 10/13/18 0259 10/13/18 0817 10/14/18 0144  NA 140 138   < > 137  --  139 139  K 4.2 4.1   < > 4.1  --  4.5 4.2  CL 110 102   < > 102  --  107 104  CO2 21* 25   < > 22  --  23 24  GLUCOSE 119* 149*   < > 129*  --  142* 131*  BUN 45* 55*   < > 69*  --  68* 64*  CREATININE 3.66* 4.42*   < > 5.52* 5.19* 4.87* 4.44*  CALCIUM 9.0 9.0   < > 8.5*  --  8.8* 9.1  PHOS 5.4* 5.6*  --   --   --  4.5  --    < > = values in this interval not displayed.   Recent Labs  Lab 10/13/18 0259 10/13/18 0626 10/14/18 0144  WBC 7.3 8.0 7.1  HGB 7.9* 8.4* 8.3*  HCT 23.8* 25.4* 25.7*  MCV 81.8 82.7 82.1  PLT 209 206 238

## 2018-10-14 NOTE — Plan of Care (Signed)
Tolerating activity within room , without resp distress, VSS.   Problem: Health Behavior/Discharge Planning: Goal: Ability to manage health-related needs will improve Outcome: Progressing   Problem: Clinical Measurements: Goal: Respiratory complications will improve Outcome: Progressing   Problem: Activity: Goal: Capacity to carry out activities will improve Outcome: Progressing

## 2018-10-14 NOTE — Discharge Summary (Signed)
Bill [name], is a 38 y.o. male  DOB 07-14-1980  MRN [id number].  Admission date:  10/06/2018  Admitting Physician  Hosie Poisson, MD  Discharge Date:  10/14/2018   Primary MD  Patient, No Pcp Per  Recommendations for primary care physician for things to follow:   1)Avoid ibuprofen/Advil/Aleve/Motrin/Goody Powders/Naproxen/BC powders/Meloxicam/Diclofenac/Indomethacin and other Nonsteroidal anti-inflammatory medications as these will make you more likely to bleed and can cause stomach ulcers, can also cause Kidney problems.  2)STOP Losartan and STOP HCTZ/Hydrochlorthiazide due to kidney concerns 3)STOP Metformin due to kidney concerns 4)Take Glipizide 2.5 mg twice a day with meals for Diabetes----Hold if you Skip a Meal 5)Return to work on Tuesday, 10/16/2018 6)Follow-up with Dr. Royce Macadamia Nephrologist at [facility] within a week 7)Repeat CBC/complete blood count and BMP/kidney and electrolyte test within a week   Admission Diagnosis  AKI (acute kidney injury) ([place]) [N17.9] Acute kidney injury ([place]) [N17.9] Anemia, unspecified type [D64.9] Congestive heart failure, unspecified HF chronicity, unspecified heart failure type ([place]) [I50.9]  Discharge Diagnosis  AKI (acute kidney injury) ([place]) [N17.9] Acute kidney injury ([place]) [N17.9] Anemia, unspecified type [D64.9] Congestive heart failure, unspecified HF chronicity, unspecified heart failure type ([place]) [I50.9]   Principal Problem:   AKI (acute kidney injury) ([place]) Active Problems:   Type 2 diabetes mellitus with vascular disease ([place])   Anemia   Perinephric hematoma/Rt Sided Post Renal Biopsy   Acute on chronic blood loss anemia/ Post Renal Biopsy   Diabetes 1.5, managed as type 2 ([place])   HTN (hypertension)   (HFpEF) heart failure with preserved ejection fraction ([place])      Past Medical History:  Diagnosis Date   Diabetes mellitus  without complication ([place])    Diabetic retinopathy ([place])    PDR OU   Stroke [facility])     Past Surgical History:  Procedure Laterality Date   arm surgery       HPI  from the history and physical done on the day of admission:    Chief Complaint: sob since last night.   HPI: Keison Glendinning is a 38 y.o. male with medical history significant of nonhemorrhagic stroke, type 2 diabetes mellitus without complication, hypertension initially presented to urgent care with the shortness of breath started last night.  Patient denies any fevers or chills but has occasional dry cough since 2 days.  Patient denies any nausea, vomiting, abdominal pain, diarrhea, hematemesis or hematochezia, melena.  Patient denies any syncope, headache, dizziness.  He reports worsening lower extremity swelling since Wednesday.  He also reports some orthopnea no PND.  He denies any chest pain.  He reports non compliance to medications.  He denies any dysuria. At the urgent care center he was swabbed for COVID-19 and sent out.  The results are pending. ED Course: On arrival to ED he was afebrile heart rate of 90/min respiratory rate of 18/min and oxygen sats 96-99 on room air blood pressure 159/88.  Stool for occult blood is negative.  Labs reveal BNP of 317.3, troponin of 0.03, hemoglobin  of 6.2 hematocrit of 20.5, creatinine of 3.55 and BUN of 37 albumin of 3. His chest x-ray is clear without  acute cardiopulmonary disease    Hospital Course:   Brief Summary  38 year old male with past medical history significant for nonhemorrhagic stroke, type 2 diabetes, hypertension who initially presented to the urgent care with shortness of breath that is started in night prior to presentation.  He denies fever but he report occasional dry cough.  Patient denies melena hematochezia.  He report worsening lower extremity edema since Wednesday prior to admission.  He also reports orthopnea.  COVID 19 test was sent out from urgent care  center.  Pending results at this time. With severe anemia with a hemoglobin at 6, Acute renal failure with a creatinine at 3.5. Status post right kidney biopsy with postbiopsy hematoma  Assessment & Plan:   Principal Problem:   AKI (acute kidney injury) ([place]) Active Problems:   Type 2 diabetes mellitus with vascular disease ([facility])   Anemia   Perinephric hematoma/Rt Sided Post Renal Biopsy   Acute on chronic blood loss anemia/ Post Renal Biopsy   Diabetes 1.5, managed as type 2 ([place])   HTN (hypertension)   (HFpEF) heart failure with preserved ejection fraction ([facility])  1)Perinephric Hematoma---- s/p Renal biopsy on 02/13/39 complicated by perinephric hematoma requiring Gelfoam injection, hemoglobin is up above 8 from 5.9 after transfusion of 2 units of packed cells on 10/12/2018,  follow-up renal ultrasound on 10/12/2018 without evidence of active bleeding discussed with IR attending Dr. Markus Daft and Nephrologist Dr. Joelyn Oms  2)HFpEF/Dyspnea --- much improved with diuretics, echo from 10/08/2018 with EF of 55 to 60%, most likely due to fluid overload rather than pulmonary infection, COVID 19 was Rule out; Test Negative X 2, one Neg CoVID test from [facility] urgent care center.  [facility] will arrange outpatient follow-up.Marland KitchenMarland KitchenLE edema is improving with diuresis, lower extremity venous Dopplers negative for DVT  3)Acute on chronic Anemia----underlying anemia of CKD compounded by acute blood loss secondary to right-sided perinephric hematoma after kidney biopsy on 10/11/2018, ,  hemoglobin is up above 8 from 5.9 after transfusion of 2 units of packed cells on 10/12/2018,  previously received IV iron and Aranesp, Iron level normal at 46, ferritin 973, folic acid 13 vitamin B 500. Patient denies any history of melena, hematochezia, hematemesis.  4)AKI Vs acute/CKD----longstanding history of diabetes and hypertension, PR3 ANCA weakly positive at 5.9, ANA negative, s/p renal biopsy by IR on  10/11/2018 Creatinine 8 months ago was at 1.1. He presents with creatinine at 3.5, creatinine is down to 4.4 from a peak of 5.52 [place] negative for hydronephrosis, shows chronic medical diseases.  Nephrology consult appreciated  UPEP SPEP.:  Kappa light chain 126, Lamda 72,  Complement normal. Proteinuria. Diuretics as per nephrology team  5-HTN-    okay to increase amlodipine back to 10 mg daily for better BP control, losartan and HCTZ has been discontinued due to kidney concerns    6-DM type 2; Continue SSI.  A1c 6.4 reflecting excellent control, discontinued  metformin due to kidney concerns, may take glipizide 2.5 mg twice daily with meals  Disposition--- Home   DVT prophylaxis: SCD no anticoagulation due to anemia.  Code Status: full code.  Family Communication: mother and wife over phone Disposition Plan:  Home  Consultants:   Nephrology  IR renal biopsy  Procedures:   Rt renal biopsy on 10/11/2018 by IR   Antimicrobials:   none  Discharge Condition: stable  Follow  UP  Follow-up Information    [facility] Follow up.   Specialty:  Family Medicine Why:  please made apt  Contact information: [place] [address] [phone]        Claudia Desanctis, MD Follow up.   Specialty:  Internal Medicine Why:  we will call with appointment and labs Contact information: [place] [address] [phone]           Diet and Activity recommendation:  As advised  Discharge Instructions     Discharge Instructions    Call MD for:  difficulty breathing, headache or visual disturbances   Complete by:  As directed    Call MD for:  persistant dizziness or light-headedness   Complete by:  As directed    Call MD for:  persistant nausea and vomiting   Complete by:  As directed    Call MD for:  severe uncontrolled pain   Complete by:  As directed    Call MD for:  temperature >100.4   Complete by:  As  directed    Diet - low sodium heart healthy   Complete by:  As directed    Diet Carb Modified   Complete by:  As directed    Discharge instructions   Complete by:  As directed    1)Avoid ibuprofen/Advil/Aleve/Motrin/Goody Powders/Naproxen/BC powders/Meloxicam/Diclofenac/Indomethacin and other Nonsteroidal anti-inflammatory medications as these will make you more likely to bleed and can cause stomach ulcers, can also cause Kidney problems.  2)STOP Losartan and STOP HCTZ/Hydrochlorthiazide due to kidney concerns 3)STOP Metformin due to kidney concerns 4)Take Glipizide 2.5 mg twice a day with meals for Diabetes----Hold if you Skip a Meal 5)Return to work on Tuesday, 10/16/2018 6) follow-up with Dr. Royce Macadamia nephrologist at [facility] within a week 7) repeat CBC/complete blood count and BMP/kidney and electrolyte test within a week   Increase activity slowly   Complete by:  As directed    Place TED hose   Complete by:  As directed        Discharge Medications     Allergies as of 10/14/2018   No Known Allergies     Medication List    STOP taking these medications   hydrochlorothiazide 25 MG tablet Commonly known as:  HYDRODIURIL   losartan-hydrochlorothiazide 50-12.5 MG tablet Commonly known as:  HYZAAR   metFORMIN 500 MG tablet Commonly known as:  GLUCOPHAGE     TAKE these medications   amLODipine 10 MG tablet Commonly known as:  NORVASC Take 1 tablet (10 mg total) by mouth daily.   aspirin 81 MG EC tablet Take 1 tablet (81 mg total) by mouth daily with breakfast. What changed:  when to take this   atorvastatin 80 MG tablet Commonly known as:  LIPITOR Take 1 tablet (80 mg total) by mouth every evening. What changed:  when to take this   furosemide 80 MG tablet Commonly known as:  LASIX Take 1 tablet (80 mg total) by mouth daily. Start taking on:  Oct 15, 2018   glipiZIDE 5 MG tablet Commonly known as:  Glucotrol Take 0.5 tablets (2.5 mg total) by  mouth 2 (two) times daily before a meal. Take for Diabetes----Hold if you Skip a Meal   IRON PO Take 1 tablet by mouth daily.      Major procedures and Radiology Reports - PLEASE review detailed and final reports for all details, in brief -   US Renal  Result Date: 10/12/2018 CLINICAL DATA:  Hematoma right kidney after biopsy yesterday EXAM: RENAL / URINARY TRACT ULTRASOUND COMPLETE COMPARISON:  10/11/2018 FINDINGS: Bilateral echogenic renal cortex consistent with medical renal disease. Heterogeneous material about the lower pole right kidney measuring 7.8 x 3.8 x 3.6 cm. There is been no convincing change from prior when allowing for irregular shape and inconsistency of ultrasound planes. There may has also been change in the clot character. There is internal echogenic material with ring down artifact, likely air or Gelfoam. No hydronephrosis. IMPRESSION: 1. No suspected change in the right perinephric hematoma. No new abnormality. 2. Medical renal disease. Electronically Signed   By: Monte Fantasia M.D.   On: 10/12/2018 11:57   US Renal  Result Date: 10/11/2018 CLINICAL DATA:  38 year old with renal failure and recent right renal biopsy. Patient developed a perinephric hematoma immediately following the renal biopsy. Decreased hemoglobin since the biopsy and need to follow-up perinephric hematoma. EXAM: RENAL / URINARY TRACT ULTRASOUND COMPLETE COMPARISON:  10/07/1998 20.  Biopsy images from 10/11/2018 FINDINGS: Right Kidney: Renal measurements: 12.5 x 6.1 x 7.3 cm = volume: 295 mL. Slightly increased echogenicity in right kidney is similar to the prior diagnostic examination. There is now a small amount of fluid around the kidney. No evidence for active bleeding based on the color Doppler images. Complex collection along the inferior aspect of the right kidney is compatible with the known hematoma. There is echogenic material within this hematoma compatible with the recently injected Gel-Foam. The  hematoma roughly measures 4.8 x 3.6 x 4.2 cm. Negative for right hydronephrosis. No significant compression on the right kidney. Left Kidney: Renal measurements: 11.4 x 6.4 x 5.3 cm = volume: 203 mL. Slightly increased echogenicity is similar to the previous examination. Negative for left hydronephrosis. Bladder: Appears normal for degree of bladder distention. Right ureter jet is present. IMPRESSION: 1. Small perinephric hematoma associated with the right kidney lower pole. Echogenic material within the hematoma is compatible with the a Gel-Foam injected during the biopsy procedure. No evidence for active bleeding from the right kidney lower pole. 2. Negative for hydronephrosis. 3. Stable appearance of left kidney without hydronephrosis. Electronically Signed   By: Markus Daft M.D.   On: 10/11/2018 14:59   US Renal  Result Date: 10/07/2018 CLINICAL DATA:  Initial evaluation for acute renal injury. EXAM: RENAL / URINARY TRACT ULTRASOUND COMPLETE COMPARISON:  None available. FINDINGS: Right Kidney: Renal measurements: 11.6 x 5.7 x 7.3 cm = volume: 247.4 mL. Increased echogenicity seen within the renal parenchyma. No mass or hydronephrosis visualized. Left Kidney: Renal measurements: 11.7 x 7.6 x 6.2 cm = volume: 286.8 mL. Increased echogenicity seen within the renal parenchyma. No mass or hydronephrosis visualized. Bladder: Appears normal for degree of bladder distention. Bilateral pleural effusions noted. IMPRESSION: 1. Increased echogenicity within the renal parenchyma bilaterally, compatible with medical renal disease. No hydronephrosis. 2. Bilateral pleural effusions. Electronically Signed   By: Jeannine Boga M.D.   On: 10/07/2018 20:00   Dg Chest Port 1 View  Result Date: 10/06/2018 CLINICAL DATA:  Leg swelling and short of breath EXAM: PORTABLE CHEST 1 VIEW COMPARISON:  09/01/2005 FINDINGS: Normal heart size. Lungs are under aerated with bibasilar atelectasis. No pneumothorax or pleural effusion.  IMPRESSION: Normal heart size. Lungs clear. No pneumothorax. No pleural effusion. Electronically Signed   By: Marybelle Killings M.D.   On: 10/06/2018 16:19   US Biopsy (kidney)  Addendum Date: 10/11/2018   ADDENDUM REPORT: 10/11/2018 14:51 ADDENDUM: Correction to the  report: Complication: Small perinephric hematoma. Gel-Foam was injected within the perinephric hematoma. Patient was hemodynamically stable during and after the procedure. SIR level B: Nominal therapy (including overnight admission for observation), no consequence. Electronically Signed   By: Markus Daft M.D.   On: 10/11/2018 14:51   Result Date: 10/11/2018 INDICATION: 40 year old with acute kidney injury and request for random renal biopsy. EXAM: ULTRASOUND-GUIDED RANDOM RENAL BIOPSY MEDICATIONS: None. ANESTHESIA/SEDATION: Fentanyl 100 mcg IV; Versed 1.5 mg IV Moderate Sedation Time:  36 minutes The patient was continuously monitored during the procedure by the interventional radiology nurse under my direct supervision. FLUOROSCOPY TIME:  None COMPLICATIONS: None immediate. PROCEDURE: Informed written consent was obtained from the patient after a thorough discussion of the procedural risks, benefits and alternatives. All questions were addressed. Maximal Sterile Barrier Technique was utilized including mask, sterile gowns, sterile gloves, sterile drape, hand hygiene and skin antiseptic. A timeout was performed prior to the initiation of the procedure. Patient was placed prone. Both kidneys were evaluated with ultrasound. The right kidney was selected for biopsy. Right flank was prepped with chlorhexidine and sterile field was created. Skin and soft tissues were anesthetized with 1% lidocaine. Using ultrasound guidance, 16 gauge core needle was directed into the right kidney lower pole from a posteromedial approach. Total of 2 core biopsies were obtained and specimens were placed in saline. Small perinephric hematoma was immediately identified following  the second core biopsy. Blood flow was identified within the perinephric hematoma. Therefore, Gelfoam plugs and Gel-Foam slurry was injected into this perinephric hematoma. Bandage placed over the puncture site. Patient was very anxious after the procedure and could not tolerate lying prone. Therefore, the patient was placed on his back with head slightly elevated. FINDINGS: Negative for hydronephrosis. Two core biopsies obtained from the right kidney lower pole. Adequate specimens were obtained. Small perinephric hematoma was immediately identified following the second core biopsy. The hematoma slowly enlarging after the procedure. There was blood flow within the perinephric hematoma. Therefore, Gel-Foam was injected into the perinephric hematoma. At the end of the procedure, there was no significant blood flow within the perinephric hematoma but evidence for a small amount of bleeding along the needle track within the right kidney lower pole. IMPRESSION: 1. Ultrasound-guided core biopsy of the right kidney lower pole. 2. Procedure was complicated by a post biopsy perinephric hematoma. Gel-Foam was injected in the perinephric hematoma. Patient was hemodynamically stable during and after the procedure. Plan for strict bed rest today and will get follow-up CBC. Procedure and plan discussed with Dr. Denton Brick. Electronically Signed: By: Markus Daft M.D. On: 10/11/2018 10:27   Vas [place] Lower Extremity Venous (dvt)  Result Date: 10/08/2018  Lower Venous Study Indications: Swelling.  Performing Technologist: Oliver Hum RVT  Examination Guidelines: A complete evaluation includes B-mode imaging, spectral Doppler, color Doppler, and power Doppler as needed of all accessible portions of each vessel. Bilateral testing is considered an integral part of a complete examination. Limited examinations for reoccurring indications may be performed as noted.  +---------+---------------+---------+-----------+----------+-------+   RIGHT     Compressibility Phasicity Spontaneity Properties Summary  +---------+---------------+---------+-----------+----------+-------+  CFV       Full            Yes       Yes                             +---------+---------------+---------+-----------+----------+-------+  SFJ       Full                                                      +---------+---------------+---------+-----------+----------+-------+  FV Prox   Full                                                      +---------+---------------+---------+-----------+----------+-------+  FV Mid    Full                                                      +---------+---------------+---------+-----------+----------+-------+  FV Distal Full                                                      +---------+---------------+---------+-----------+----------+-------+  PFV       Full                                                      +---------+---------------+---------+-----------+----------+-------+  POP       Full            Yes       Yes                             +---------+---------------+---------+-----------+----------+-------+  PTV       Full                                                      +---------+---------------+---------+-----------+----------+-------+  PERO      Full                                                      +---------+---------------+---------+-----------+----------+-------+   +---------+---------------+---------+-----------+----------+-------+  LEFT      Compressibility Phasicity Spontaneity Properties Summary  +---------+---------------+---------+-----------+----------+-------+  CFV       Full            Yes       Yes                             +---------+---------------+---------+-----------+----------+-------+  SFJ       Full                                                      +---------+---------------+---------+-----------+----------+-------+  FV Prox   Full                                                       +---------+---------------+---------+-----------+----------+-------+  FV Mid    Full                                                      +---------+---------------+---------+-----------+----------+-------+  FV Distal Full                                                      +---------+---------------+---------+-----------+----------+-------+  PFV       Full                                                      +---------+---------------+---------+-----------+----------+-------+  POP       Full            Yes       Yes                             +---------+---------------+---------+-----------+----------+-------+  PTV       Full                                                      +---------+---------------+---------+-----------+----------+-------+  PERO      Full                                                      +---------+---------------+---------+-----------+----------+-------+     Summary: Right: There is no evidence of deep vein thrombosis in the lower extremity. No cystic structure found in the popliteal fossa. Left: There is no evidence of deep vein thrombosis in the lower extremity. No cystic structure found in the popliteal fossa.  *See table(s) above for measurements and observations. Electronically signed by Ruta Hinds MD on 10/08/2018 at 5:53:38 PM.    Final     Micro Results    Recent Results (from the past 240 hour(s))  SARS Coronavirus 2 [facility] order, Performed in [facility] hospital lab)     Status: None   Collection Time: 10/07/18  2:32 PM  Result Value Ref Range Status   SARS Coronavirus 2 NEGATIVE NEGATIVE Final    Comment: (NOTE) If result is NEGATIVE SARS-CoV-2 target nucleic acids are NOT DETECTED. The SARS-CoV-2 RNA is generally detectable in upper and lower  respiratory specimens during the acute phase of infection. The lowest  concentration of SARS-CoV-2 viral copies this assay can detect is 250  copies / mL. A negative result does not preclude SARS-CoV-2 infection   and should not be used as the sole basis for treatment or other  patient management decisions.  A negative result may occur with  improper specimen collection / handling, submission of specimen other  than nasopharyngeal swab, presence of viral mutation(s) within the  areas targeted by  this assay, and inadequate number of viral copies  (<250 copies / mL). A negative result must be combined with clinical  observations, patient history, and epidemiological information. If result is POSITIVE SARS-CoV-2 target nucleic acids are DETECTED. The SARS-CoV-2 RNA is generally detectable in upper and lower  respiratory specimens dur ing the acute phase of infection.  Positive  results are indicative of active infection with SARS-CoV-2.  Clinical  correlation with patient history and other diagnostic information is  necessary to determine patient infection status.  Positive results do  not rule out bacterial infection or co-infection with other viruses. If result is PRESUMPTIVE POSTIVE SARS-CoV-2 nucleic acids MAY BE PRESENT.   A presumptive positive result was obtained on the submitted specimen  and confirmed on repeat testing.  While 2019 novel coronavirus  (SARS-CoV-2) nucleic acids may be present in the submitted sample  additional confirmatory testing may be necessary for epidemiological  and / or clinical management purposes  to differentiate between  SARS-CoV-2 and other Sarbecovirus currently known to infect humans.  If clinically indicated additional testing with an alternate test  methodology [phone]) is advised. The SARS-CoV-2 RNA is generally  detectable in upper and lower respiratory sp ecimens during the acute  phase of infection. The expected result is Negative. Fact Sheet for Patients:  StrictlyIdeas.no Fact Sheet for Healthcare Providers: BankingDealers.co.za This test is not yet approved or cleared by the [place] FDA  and has been authorized for detection and/or diagnosis of SARS-CoV-2 by FDA under an Emergency Use Authorization (EUA).  This EUA will remain in effect (meaning this test can be used) for the duration of the COVID-19 declaration under Section 564(b)(1) of the Act, 21 U.S.C. section 360bbb-3(b)(1), unless the authorization is terminated or revoked sooner. Performed at [facility], [place] [address], [place], [place] [address]     Today   Subjective    Olusegun Gerstenberger today has no new concerns, eating or drinking well, no chest pains no dyspnea on exertion no dizziness ambulating, voiding okay no hematuria          Patient has been seen and examined prior to discharge   Objective   Blood pressure (!) 162/87, pulse 88, temperature 98.2 F (36.8 C), temperature source Oral, resp. rate 18, height 5\' 11"  (1.803 m), weight 85 kg, SpO2 100 %.   Intake/Output Summary (Last 24 hours) at 10/14/2018 1140 Last data filed at 10/14/2018 0500 Gross per 24 hour  Intake 240 ml  Output 1700 ml  Net -1460 ml   Exam Gen:- Awake Alert,  In no apparent distress  HEENT:- [place].AT, No sclera icterus Neck-Supple Neck,No JVD,.  Lungs-improved air movement  CV- S1, S2 normal Abd-  +ve B.Sounds, Abd Soft, No tenderness,    Extremity/Skin:- 1 + pitting edema, good pedal pulses Psych-affect is appropriate, oriented x3 Neuro-no new focal deficits, no tremors MSK--- right lower back with dressing over right kidney biopsy site   Data Review   CBC w Diff:  Lab Results  Component Value Date   WBC 7.1 10/14/2018   HGB 8.3 (L) 10/14/2018   HCT 25.7 (L) 10/14/2018   PLT 238 10/14/2018   LYMPHOPCT 15 10/06/2018   MONOPCT 6 10/06/2018   EOSPCT 2 10/06/2018   BASOPCT 0 10/06/2018    CMP:  Lab Results  Component Value Date   NA 139 10/14/2018   K 4.2 10/14/2018   CL 104 10/14/2018   CO2 24 10/14/2018   BUN 64 (H) 10/14/2018  CREATININE 4.44 (H) 10/14/2018   PROT 7.3 10/06/2018   ALBUMIN 2.8  (L) 10/13/2018   BILITOT 0.5 10/06/2018   ALKPHOS 59 10/06/2018   AST 27 10/06/2018   ALT 27 10/06/2018    Total Discharge time is about 33 minutes  Roxan Hockey M.D on 10/14/2018 at 11:40 AM  Go to [email or web address] -  for contact info  [facility] - Office  [phone]

## 2018-10-15 ENCOUNTER — Telehealth: Payer: Self-pay

## 2018-10-15 NOTE — Telephone Encounter (Signed)
Called to inform patient of Hospital Followup appt with Dr Dana Allan at [facility] Wednesday 10/17/2018 at 11:30am- provided instructions for telelvisit

## 2018-10-17 ENCOUNTER — Other Ambulatory Visit: Payer: Self-pay

## 2018-10-17 ENCOUNTER — Ambulatory Visit (INDEPENDENT_AMBULATORY_CARE_PROVIDER_SITE_OTHER): Payer: 59 | Admitting: Internal Medicine

## 2018-10-17 DIAGNOSIS — I10 Essential (primary) hypertension: Secondary | ICD-10-CM

## 2018-10-17 DIAGNOSIS — N189 Chronic kidney disease, unspecified: Secondary | ICD-10-CM

## 2018-10-17 DIAGNOSIS — I5032 Chronic diastolic (congestive) heart failure: Secondary | ICD-10-CM

## 2018-10-17 DIAGNOSIS — E1159 Type 2 diabetes mellitus with other circulatory complications: Secondary | ICD-10-CM

## 2018-10-17 DIAGNOSIS — E1169 Type 2 diabetes mellitus with other specified complication: Secondary | ICD-10-CM | POA: Insufficient documentation

## 2018-10-17 DIAGNOSIS — N179 Acute kidney failure, unspecified: Secondary | ICD-10-CM | POA: Diagnosis not present

## 2018-10-17 DIAGNOSIS — E785 Hyperlipidemia, unspecified: Secondary | ICD-10-CM

## 2018-10-17 DIAGNOSIS — S37019A Minor contusion of unspecified kidney, initial encounter: Secondary | ICD-10-CM | POA: Diagnosis not present

## 2018-10-17 DIAGNOSIS — Z09 Encounter for follow-up examination after completed treatment for conditions other than malignant neoplasm: Secondary | ICD-10-CM

## 2018-10-17 DIAGNOSIS — D631 Anemia in chronic kidney disease: Secondary | ICD-10-CM

## 2018-10-17 NOTE — Progress Notes (Signed)
Virtual Visit via Video Note  I connected with Bill [name] on 10/17/18 at 11:30 AM EDT by a video enabled telemedicine application and verified that I am speaking with the correct person using two identifiers.  Location patient: home Location provider: work office Persons participating in the virtual visit: patient, provider  I discussed the limitations of evaluation and management by telemedicine and the availability of in person appointments. The patient expressed understanding and agreed to proceed.   HPI: This visit is to establish care as a patient in our clinic in for hospital follow-up.  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  He went to the hospital due to shortness of breath and lower extremity edema.  Was found to have a hemoglobin of 6.  During that hospitalization was found to have a creatinine of 3.5 and had a kidney biopsy that was complicated by a post biopsy hematoma.  He was transfused a total of 4 units of PRBCs during that hospitalization.  I do not see results of kidney biopsy, however he has a follow-up appointment with nephrology scheduled for me the 19th.  His hemoglobin A1c was 6.4, however metformin was discontinued appropriately due to his declining kidney function and he was placed on glipizide.  He is on aspirin and on a high intensity statin, Lipitor 80 mg, however his LDL is still not at goal at 94.  He has been doing well since his discharge from the hospital 3 days ago, is no longer short of breath, states his lower extremity edema is improved on Lasix 80 mg, he  does have some blurry vision but knows that this is due to his diabetic retinopathy and he is already under the care of an ophthalmologist.  He is a never smoker and drinks alcohol only occasionally.   ROS: Constitutional: Denies fever, chills, diaphoresis, appetite change and fatigue.  HEENT: Denies photophobia, eye pain, redness, hearing loss, ear pain, congestion, sore throat, rhinorrhea, sneezing, mouth sores, trouble swallowing, neck pain, neck stiffness and tinnitus.   Respiratory: Denies SOB, DOE, cough, chest tightness,  and wheezing.   Cardiovascular: Denies chest pain, palpitations and leg swelling.  Gastrointestinal: Denies nausea, vomiting, abdominal pain, diarrhea, constipation, blood in stool and abdominal distention.  Genitourinary: Denies dysuria, urgency, frequency, hematuria, flank pain and difficulty urinating.  Endocrine: Denies: hot or cold intolerance, sweats, changes in hair or nails, polyuria, polydipsia. Musculoskeletal: Denies myalgias, back pain, joint swelling, arthralgias and gait problem.  Skin: Denies pallor, rash and wound.  Neurological: Denies dizziness, seizures, syncope, weakness, light-headedness, numbness and headaches.  Hematological: Denies adenopathy. Easy bruising, personal or family bleeding history  Psychiatric/Behavioral: Denies suicidal ideation, mood changes, confusion, nervousness, sleep disturbance and agitation   Past Medical History:  Diagnosis Date  . Diabetes mellitus without complication ([place])   . Diabetic retinopathy ([place])    PDR OU  . Stroke [facility])     Past Surgical History:  Procedure Laterality Date  . arm surgery      Family History  Problem Relation Age of Onset  . Diabetes Mellitus II Mother   . Hypertension Mother   . Cataracts Mother   . Diabetes Mellitus II Father   .  Stroke Father   . Hypertension Father   . Glaucoma Maternal Grandmother   . Amblyopia Neg Hx   . Blindness Neg Hx   . Macular degeneration Neg Hx    . Retinal detachment Neg Hx   . Strabismus Neg Hx   . Retinitis pigmentosa Neg Hx     SOCIAL HX:   reports that he has never smoked. He has never used smokeless tobacco. He reports current alcohol use. No history on file for drug.   Current Outpatient Medications:  .  amLODipine (NORVASC) 10 MG tablet, Take 1 tablet (10 mg total) by mouth daily., Disp: 30 tablet, Rfl: 11 .  aspirin 81 MG EC tablet, Take 1 tablet (81 mg total) by mouth daily with breakfast., Disp: 30 tablet, Rfl: 11 .  atorvastatin (LIPITOR) 80 MG tablet, Take 1 tablet (80 mg total) by mouth every evening., Disp: 30 tablet, Rfl: 12 .  Ferrous Sulfate (IRON PO), Take 1 tablet by mouth daily., Disp: , Rfl:  .  furosemide (LASIX) 80 MG tablet, Take 1 tablet (80 mg total) by mouth daily., Disp: 30 tablet, Rfl: 2 .  glipiZIDE (GLUCOTROL) 5 MG tablet, Take 0.5 tablets (2.5 mg total) by mouth 2 (two) times daily before a meal. Take for Diabetes----Hold if you Skip a Meal, Disp: 30 tablet, Rfl: 2  EXAM:   VITALS per patient if applicable: none reported  GENERAL: alert, oriented, appears well and in no acute distress  HEENT: atraumatic, conjunttiva clear, no obvious abnormalities on inspection of external nose and ears  NECK: normal movements of the head and neck  LUNGS: on inspection no signs of respiratory distress, breathing rate appears normal, no obvious gross increased work of breathing, gasping or wheezing  CV: no obvious cyanosis  MS: moves all visible extremities without noticeable abnormality  PSYCH/NEURO: pleasant and cooperative, no obvious depression or anxiety, speech and thought processing grossly intact  ASSESSMENT AND PLAN:   Hospital discharge follow-up  Type 2 diabetes mellitus with vascular disease (HCC) -A1c was 6.4 during his hospitalization. -Was taken off metformin due to deteriorating renal function and placed on glipizide. -Will need repeat A1c in 3 months. -He has not checked his CBGs  since hospital discharge, he does have a meter, have instructed him to take CBGs at least daily fasting in the morning.  Anemia due to chronic kidney disease, unspecified CKD stage -He is status post 4 units of PRBCs during hospitalization. -Discharge hemoglobin was 8.3.  Perinephric hematoma/Rt Sided Post Renal Biopsy -Noted, will need repeat CBC at next life visit.  AKI (acute kidney injury) ([place]) -Status post kidney biopsy during recent hospitalization, I do not see results. -Has follow-up with nephrology scheduled for later this month.  Chronic heart failure with preserved ejection fraction ([place]) -Echo in April 2020 with ejection fraction of 55 to 60% with severely increased left ventricular wall thickness.  Positive pericardial effusion but no evidence of tamponade.  Essential hypertension -Appears to have been well controlled while in the hospital, he does not check blood pressures at home. -For now continue Norvasc.  Hyperlipidemia associated with type 2 diabetes mellitus (HCC) -Continue Lipitor 80 mg, if cannot bring LDL below 70 can consider addition of ezetimibe.  May even need a PSK 9 inhibitor given his CAD risk factors.     I discussed the assessment and treatment plan with the patient. The patient was provided an opportunity to ask questions and all were answered. The patient agreed with the plan and demonstrated an  understanding of the instructions.   The patient was advised to call back or seek an in-person evaluation if the symptoms worsen or if the condition fails to improve as anticipated.    Lelon Frohlich, MD  [facility]

## 2018-10-30 ENCOUNTER — Encounter (HOSPITAL_COMMUNITY): Payer: Self-pay | Admitting: Nephrology

## 2018-10-30 MED FILL — SPS 15 GM/60 ML SUSPENSION: 15 | 1 days supply | Qty: 60 | Fill #0

## 2018-11-13 ENCOUNTER — Other Ambulatory Visit (HOSPITAL_COMMUNITY): Payer: Self-pay | Admitting: *Deleted

## 2018-11-13 ENCOUNTER — Encounter (HOSPITAL_COMMUNITY): Payer: 59

## 2018-11-13 NOTE — Discharge Instructions (Signed)
Ferumoxytol injection What is this medicine? FERUMOXYTOL is an iron complex. Iron is used to make healthy red blood cells, which carry oxygen and nutrients throughout the body. This medicine is used to treat iron deficiency anemia. This medicine may be used for other purposes; ask your health care provider or pharmacist if you have questions. COMMON BRAND NAME(S): Feraheme What should I tell my health care provider before I take this medicine? They need to know if you have any of these conditions: -anemia not caused by low iron levels -high levels of iron in the blood -magnetic resonance imaging (MRI) test scheduled -an unusual or allergic reaction to iron, other medicines, foods, dyes, or preservatives -pregnant or trying to get pregnant -breast-feeding How should I use this medicine? This medicine is for injection into a vein. It is given by a health care professional in a hospital or clinic setting. Talk to your pediatrician regarding the use of this medicine in children. Special care may be needed. Overdosage: If you think you have taken too much of this medicine contact a poison control center or emergency room at once. NOTE: This medicine is only for you. Do not share this medicine with others. What if I miss a dose? It is important not to miss your dose. Call your doctor or health care professional if you are unable to keep an appointment. What may interact with this medicine? This medicine may interact with the following medications: -other iron products This list may not describe all possible interactions. Give your health care provider a list of all the medicines, herbs, non-prescription drugs, or dietary supplements you use. Also tell them if you smoke, drink alcohol, or use illegal drugs. Some items may interact with your medicine. What should I watch for while using this medicine? Visit your doctor or healthcare professional regularly. Tell your doctor or healthcare professional  if your symptoms do not start to get better or if they get worse. You may need blood work done while you are taking this medicine. You may need to follow a special diet. Talk to your doctor. Foods that contain iron include: whole grains/cereals, dried fruits, beans, or peas, leafy green vegetables, and organ meats (liver, kidney). What side effects may I notice from receiving this medicine? Side effects that you should report to your doctor or health care professional as soon as possible: -allergic reactions like skin rash, itching or hives, swelling of the face, lips, or tongue -breathing problems -changes in blood pressure -feeling faint or lightheaded, falls -fever or chills -flushing, sweating, or hot feelings -swelling of the ankles or feet Side effects that usually do not require medical attention (report to your doctor or health care professional if they continue or are bothersome): -diarrhea -headache -nausea, vomiting -stomach pain This list may not describe all possible side effects. Call your doctor for medical advice about side effects. You may report side effects to FDA at 1-800-FDA-1088. Where should I keep my medicine? This drug is given in a hospital or clinic and will not be stored at home. NOTE: This sheet is a summary. It may not cover all possible information. If you have questions about this medicine, talk to your doctor, pharmacist, or health care provider.  2019 Elsevier/Gold Standard (2016-07-18 20:21:10)  Epoetin Alfa injection What is this medicine? EPOETIN ALFA (e POE e tin AL fa) helps your body make more red blood cells. This medicine is used to treat anemia caused by chronic kidney disease, cancer chemotherapy, or HIV-therapy. It may  also be used before surgery if you have anemia. This medicine may be used for other purposes; ask your health care provider or pharmacist if you have questions. COMMON BRAND NAME(S): Epogen, Procrit, Retacrit What should I tell  my health care provider before I take this medicine? They need to know if you have any of these conditions: -cancer -heart disease -high blood pressure -history of blood clots -history of stroke -low levels of folate, iron, or vitamin B12 in the blood -seizures -an unusual or allergic reaction to erythropoietin, albumin, benzyl alcohol, hamster proteins, other medicines, foods, dyes, or preservatives -pregnant or trying to get pregnant -breast-feeding How should I use this medicine? This medicine is for injection into a vein or under the skin. It is usually given by a health care professional in a hospital or clinic setting. If you get this medicine at home, you will be taught how to prepare and give this medicine. Use exactly as directed. Take your medicine at regular intervals. Do not take your medicine more often than directed. It is important that you put your used needles and syringes in a special sharps container. Do not put them in a trash can. If you do not have a sharps container, call your pharmacist or healthcare provider to get one. A special MedGuide will be given to you by the pharmacist with each prescription and refill. Be sure to read this information carefully each time. Talk to your pediatrician regarding the use of this medicine in children. While this drug may be prescribed for selected conditions, precautions do apply. Overdosage: If you think you have taken too much of this medicine contact a poison control center or emergency room at once. NOTE: This medicine is only for you. Do not share this medicine with others. What if I miss a dose? If you miss a dose, take it as soon as you can. If it is almost time for your next dose, take only that dose. Do not take double or extra doses. What may interact with this medicine? Interactions have not been studied. This list may not describe all possible interactions. Give your health care provider a list of all the medicines,  herbs, non-prescription drugs, or dietary supplements you use. Also tell them if you smoke, drink alcohol, or use illegal drugs. Some items may interact with your medicine. What should I watch for while using this medicine? Your condition will be monitored carefully while you are receiving this medicine. You may need blood work done while you are taking this medicine. This medicine may cause a decrease in vitamin B6. You should make sure that you get enough vitamin B6 while you are taking this medicine. Discuss the foods you eat and the vitamins you take with your health care professional. What side effects may I notice from receiving this medicine? Side effects that you should report to your doctor or health care professional as soon as possible: -allergic reactions like skin rash, itching or hives, swelling of the face, lips, or tongue -seizures -signs and symptoms of a blood clot such as breathing problems; changes in vision; chest pain; severe, sudden headache; pain, swelling, warmth in the leg; trouble speaking; sudden numbness or weakness of the face, arm or leg -signs and symptoms of a stroke like changes in vision; confusion; trouble speaking or understanding; severe headaches; sudden numbness or weakness of the face, arm or leg; trouble walking; dizziness; loss of balance or coordination Side effects that usually do not require medical attention (report to  your doctor or health care professional if they continue or are bothersome): -chills -cough -dizziness -fever -headaches -joint pain -muscle cramps -muscle pain -nausea, vomiting -pain, redness, or irritation at site where injected This list may not describe all possible side effects. Call your doctor for medical advice about side effects. You may report side effects to FDA at 1-800-FDA-1088. Where should I keep my medicine? Keep out of the reach of children. Store in a refrigerator between 2 and 8 degrees C (36 and 46 degrees F).  Do not freeze or shake. Throw away any unused portion if using a single-dose vial. Multi-dose vials can be kept in the refrigerator for up to 21 days after the initial dose. Throw away unused medicine. NOTE: This sheet is a summary. It may not cover all possible information. If you have questions about this medicine, talk to your doctor, pharmacist, or health care provider.  2019 Elsevier/Gold Standard (2017-01-06 08:35:19)

## 2018-11-14 ENCOUNTER — Other Ambulatory Visit: Payer: Self-pay

## 2018-11-14 ENCOUNTER — Ambulatory Visit (HOSPITAL_COMMUNITY)
Admission: RE | Admit: 2018-11-14 | Discharge: 2018-11-14 | Disposition: A | Discharge status: Home / Self Care | Payer: 59 | Source: Ambulatory Visit | Attending: Nephrology | Admitting: Nephrology

## 2018-11-14 VITALS — BP 173/90 | HR 78 | Temp 98.4°F | Resp 18 | Ht 71.0 in | Wt 198.0 lb

## 2018-11-14 DIAGNOSIS — N179 Acute kidney failure, unspecified: Secondary | ICD-10-CM | POA: Diagnosis not present

## 2018-11-14 DIAGNOSIS — N189 Chronic kidney disease, unspecified: Secondary | ICD-10-CM | POA: Insufficient documentation

## 2018-11-14 DIAGNOSIS — D62 Acute posthemorrhagic anemia: Secondary | ICD-10-CM

## 2018-11-14 DIAGNOSIS — D631 Anemia in chronic kidney disease: Secondary | ICD-10-CM | POA: Insufficient documentation

## 2018-11-14 LAB — POCT HEMOGLOBIN-HEMACUE: Hemoglobin: 7 g/dL — ABNORMAL LOW (ref 13.0–17.0)

## 2018-11-14 MED ORDER — SODIUM CHLORIDE 0.9 % IV SOLN
510.0000 mg | Freq: Once | INTRAVENOUS | Status: AC
  Administered 2018-11-14: 13:00:00 510 mg via INTRAVENOUS
  Filled 2018-11-14: qty 510

## 2018-11-14 MED ORDER — EPOETIN ALFA-EPBX 10000 UNIT/ML IJ SOLN
10000.0000 [IU] | INTRAMUSCULAR | Status: DC
  Administered 2018-11-14: 10000 [IU] via SUBCUTANEOUS
  Filled 2018-11-14: qty 1

## 2018-11-20 ENCOUNTER — Other Ambulatory Visit (HOSPITAL_COMMUNITY): Payer: Self-pay | Admitting: *Deleted

## 2018-11-21 ENCOUNTER — Other Ambulatory Visit: Payer: Self-pay

## 2018-11-21 ENCOUNTER — Ambulatory Visit (HOSPITAL_COMMUNITY)
Admission: RE | Admit: 2018-11-21 | Discharge: 2018-11-21 | Disposition: A | Discharge status: Home / Self Care | Payer: 59 | Source: Ambulatory Visit | Attending: Registered Nurse | Admitting: Registered Nurse

## 2018-11-21 DIAGNOSIS — D631 Anemia in chronic kidney disease: Secondary | ICD-10-CM | POA: Insufficient documentation

## 2018-11-21 DIAGNOSIS — N189 Chronic kidney disease, unspecified: Secondary | ICD-10-CM | POA: Diagnosis not present

## 2018-11-21 MED ORDER — SODIUM CHLORIDE 0.9 % IV SOLN
510.0000 mg | INTRAVENOUS | Status: DC
  Administered 2018-11-21: 510 mg via INTRAVENOUS
  Filled 2018-11-21: qty 510

## 2018-11-21 MED ORDER — SODIUM CHLORIDE 0.9 % IV SOLN
510.0000 mg | INTRAVENOUS | Status: DC
  Filled 2018-11-21: qty 17

## 2018-11-21 NOTE — Discharge Instructions (Signed)

## 2018-11-28 ENCOUNTER — Encounter (HOSPITAL_COMMUNITY): Payer: 59

## 2018-11-28 ENCOUNTER — Other Ambulatory Visit: Payer: Self-pay

## 2018-11-28 ENCOUNTER — Ambulatory Visit (HOSPITAL_COMMUNITY)
Admission: RE | Admit: 2018-11-28 | Discharge: 2018-11-28 | Disposition: A | Discharge status: Home / Self Care | Payer: 59 | Source: Ambulatory Visit | Attending: Nephrology | Admitting: Nephrology

## 2018-11-28 VITALS — BP 180/101 | HR 86 | Temp 97.9°F | Resp 18

## 2018-11-28 DIAGNOSIS — D62 Acute posthemorrhagic anemia: Secondary | ICD-10-CM | POA: Diagnosis present

## 2018-11-28 DIAGNOSIS — N189 Chronic kidney disease, unspecified: Secondary | ICD-10-CM | POA: Insufficient documentation

## 2018-11-28 DIAGNOSIS — N179 Acute kidney failure, unspecified: Secondary | ICD-10-CM | POA: Diagnosis not present

## 2018-11-28 DIAGNOSIS — D631 Anemia in chronic kidney disease: Secondary | ICD-10-CM | POA: Diagnosis present

## 2018-11-28 LAB — POCT HEMOGLOBIN-HEMACUE: Hemoglobin: 7 g/dL — ABNORMAL LOW (ref 13.0–17.0)

## 2018-11-28 MED ORDER — EPOETIN ALFA-EPBX 10000 UNIT/ML IJ SOLN
20000.0000 [IU] | INTRAMUSCULAR | Status: DC
  Administered 2018-11-28: 20000 [IU] via SUBCUTANEOUS
  Filled 2018-11-28: qty 2

## 2018-12-05 ENCOUNTER — Encounter (HOSPITAL_COMMUNITY): Payer: 59

## 2018-12-11 ENCOUNTER — Other Ambulatory Visit: Payer: Self-pay | Admitting: Nephrology

## 2018-12-11 DIAGNOSIS — S37019A Minor contusion of unspecified kidney, initial encounter: Secondary | ICD-10-CM

## 2018-12-12 ENCOUNTER — Encounter (HOSPITAL_COMMUNITY): Payer: 59

## 2018-12-19 ENCOUNTER — Other Ambulatory Visit: Payer: Self-pay

## 2018-12-19 ENCOUNTER — Ambulatory Visit
Admission: RE | Admit: 2018-12-19 | Discharge: 2018-12-19 | Disposition: A | Discharge status: Home / Self Care | Payer: 59 | Source: Ambulatory Visit | Attending: Nephrology | Admitting: Nephrology

## 2018-12-19 DIAGNOSIS — S37019A Minor contusion of unspecified kidney, initial encounter: Secondary | ICD-10-CM

## 2018-12-20 ENCOUNTER — Other Ambulatory Visit: Payer: Self-pay

## 2018-12-21 ENCOUNTER — Ambulatory Visit (HOSPITAL_COMMUNITY)
Admission: RE | Admit: 2018-12-21 | Discharge: 2018-12-21 | Disposition: A | Discharge status: Home / Self Care | Payer: 59 | Source: Ambulatory Visit | Attending: Nephrology | Admitting: Nephrology

## 2018-12-21 VITALS — BP 180/82 | HR 89 | Temp 98.1°F | Resp 18

## 2018-12-21 DIAGNOSIS — D631 Anemia in chronic kidney disease: Secondary | ICD-10-CM | POA: Diagnosis present

## 2018-12-21 DIAGNOSIS — N179 Acute kidney failure, unspecified: Secondary | ICD-10-CM | POA: Diagnosis not present

## 2018-12-21 DIAGNOSIS — N189 Chronic kidney disease, unspecified: Secondary | ICD-10-CM | POA: Insufficient documentation

## 2018-12-21 DIAGNOSIS — D62 Acute posthemorrhagic anemia: Secondary | ICD-10-CM | POA: Insufficient documentation

## 2018-12-21 LAB — IRON AND TIBC
Iron: 78 ug/dL (ref 45–182)
Saturation Ratios: 30 % (ref 17.9–39.5)
TIBC: 258 ug/dL (ref 250–450)
UIBC: 180 ug/dL

## 2018-12-21 LAB — POCT HEMOGLOBIN-HEMACUE: Hemoglobin: 6.8 g/dL — CL (ref 13.0–17.0)

## 2018-12-21 LAB — FERRITIN: Ferritin: 1072 ng/mL — ABNORMAL HIGH (ref 24–336)

## 2018-12-21 MED ORDER — EPOETIN ALFA-EPBX 10000 UNIT/ML IJ SOLN
20000.0000 [IU] | INTRAMUSCULAR | Status: DC
  Administered 2018-12-21: 15:00:00 20000 [IU] via SUBCUTANEOUS
  Filled 2018-12-21: qty 2

## 2018-12-21 NOTE — Progress Notes (Signed)
Patient's Hgb is 6.8.  Patient denies any fatigue, blood in stools, etc, states "he feels fine".  MD instructed patient to come to office next Friday to get a CBC and to keep [place] appt for two weeks.  Patient given medication injection prior to discharge.

## 2018-12-26 ENCOUNTER — Encounter (HOSPITAL_COMMUNITY): Payer: 59

## 2018-12-28 ENCOUNTER — Other Ambulatory Visit: Payer: Self-pay

## 2018-12-28 ENCOUNTER — Encounter (HOSPITAL_COMMUNITY): Payer: Self-pay | Admitting: Emergency Medicine

## 2018-12-28 ENCOUNTER — Emergency Department (HOSPITAL_COMMUNITY)
Admission: EM | Admit: 2018-12-28 | Discharge: 2018-12-28 | Disposition: A | Discharge status: Home / Self Care | Payer: 59 | Attending: Emergency Medicine | Admitting: Emergency Medicine

## 2018-12-28 DIAGNOSIS — I120 Hypertensive chronic kidney disease with stage 5 chronic kidney disease or end stage renal disease: Secondary | ICD-10-CM | POA: Diagnosis not present

## 2018-12-28 DIAGNOSIS — N186 End stage renal disease: Secondary | ICD-10-CM | POA: Insufficient documentation

## 2018-12-28 DIAGNOSIS — E875 Hyperkalemia: Secondary | ICD-10-CM

## 2018-12-28 DIAGNOSIS — E119 Type 2 diabetes mellitus without complications: Secondary | ICD-10-CM | POA: Insufficient documentation

## 2018-12-28 DIAGNOSIS — D508 Other iron deficiency anemias: Secondary | ICD-10-CM | POA: Diagnosis not present

## 2018-12-28 DIAGNOSIS — Z7984 Long term (current) use of oral hypoglycemic drugs: Secondary | ICD-10-CM | POA: Insufficient documentation

## 2018-12-28 DIAGNOSIS — R5383 Other fatigue: Secondary | ICD-10-CM | POA: Diagnosis present

## 2018-12-28 HISTORY — DX: Essential (primary) hypertension: I10

## 2018-12-28 HISTORY — DX: Disorder of kidney and ureter, unspecified: N28.9

## 2018-12-28 LAB — COMPREHENSIVE METABOLIC PANEL
ALT: 17 U/L (ref 0–44)
AST: 15 U/L (ref 15–41)
Albumin: 3.1 g/dL — ABNORMAL LOW (ref 3.5–5.0)
Alkaline Phosphatase: 40 U/L (ref 38–126)
Anion gap: 8 (ref 5–15)
BUN: 84 mg/dL — ABNORMAL HIGH (ref 6–20)
CO2: 18 mmol/L — ABNORMAL LOW (ref 22–32)
Calcium: 8.8 mg/dL — ABNORMAL LOW (ref 8.9–10.3)
Chloride: 113 mmol/L — ABNORMAL HIGH (ref 98–111)
Creatinine, Ser: 6.82 mg/dL — ABNORMAL HIGH (ref 0.61–1.24)
GFR calc Af Amer: 11 mL/min — ABNORMAL LOW (ref >60)
GFR calc non Af Amer: 9 mL/min — ABNORMAL LOW (ref >60)
Glucose, Bld: 147 mg/dL — ABNORMAL HIGH (ref 70–99)
Potassium: 6.3 mmol/L (ref 3.5–5.1)
Sodium: 139 mmol/L (ref 135–145)
Total Bilirubin: 0.3 mg/dL (ref 0.3–1.2)
Total Protein: 6.7 g/dL (ref 6.5–8.1)

## 2018-12-28 LAB — CBC
HCT: 22.4 % — ABNORMAL LOW (ref 39.0–52.0)
Hemoglobin: 6.7 g/dL — CL (ref 13.0–17.0)
MCH: 25.6 pg — ABNORMAL LOW (ref 26.0–34.0)
MCHC: 29.9 g/dL — ABNORMAL LOW (ref 30.0–36.0)
MCV: 85.5 fL (ref 80.0–100.0)
Platelets: 224 10*3/uL (ref 150–400)
RBC: 2.62 MIL/uL — ABNORMAL LOW (ref 4.22–5.81)
RDW: 17.8 % — ABNORMAL HIGH (ref 11.5–15.5)
WBC: 5.6 10*3/uL (ref 4.0–10.5)
nRBC: 0 % (ref 0.0–0.2)

## 2018-12-28 LAB — ABO/RH: ABO/RH(D): A POS

## 2018-12-28 LAB — PREPARE RBC (CROSSMATCH)

## 2018-12-28 MED ORDER — SODIUM CHLORIDE 0.9% IV SOLUTION
Freq: Once | INTRAVENOUS | Status: AC
  Administered 2018-12-28: 13:00:00 via INTRAVENOUS

## 2018-12-28 MED ORDER — FUROSEMIDE 10 MG/ML IJ SOLN
80.0000 mg | Freq: Once | INTRAMUSCULAR | Status: AC
  Administered 2018-12-28: 80 mg via INTRAVENOUS
  Filled 2018-12-28: qty 8

## 2018-12-28 MED ORDER — SODIUM ZIRCONIUM CYCLOSILICATE 10 G PO PACK
20.0000 g | PACK | Freq: Once | ORAL | Status: DC
  Filled 2018-12-28: qty 2

## 2018-12-28 MED ORDER — SODIUM ZIRCONIUM CYCLOSILICATE 10 G PO PACK
20.0000 g | PACK | Freq: Once | ORAL | Status: AC
  Administered 2018-12-28: 20 g via ORAL
  Filled 2018-12-28: qty 2

## 2018-12-28 NOTE — ED Notes (Signed)
Critical hgb 6.7

## 2018-12-28 NOTE — Discharge Instructions (Addendum)
You received 1 unit of blood today in the ED.  You were also treated for a high potassium level of 6.3.  A plan for a low potassium diet has been attached to your chart, please attempt to follow this guidelines.  You will need to obtain new labs next week with your primary care physician.  If you experience any chest pain, shortness of breath, worsening symptoms please return to the emergency department.

## 2018-12-28 NOTE — ED Triage Notes (Signed)
Pt had labs drawn the 10th, called by PCP and told to come to ED for blood transfusion for low HGB. Pt endorses fatigue. Denies blood in stool.

## 2018-12-28 NOTE — ED Notes (Signed)
Patient verbalizes understanding of discharge instructions. Opportunity for questioning and answers were provided. Armband removed by staff, pt discharged from ED.  

## 2018-12-28 NOTE — ED Provider Notes (Addendum)
[facility] EMERGENCY DEPARTMENT Provider Note   CSN: [id number] Arrival date & time: 12/28/18  1033    History   Chief Complaint No chief complaint on file.   HPI Bill [name] is a 38 y.o. male.     38 y.o male with a PMH of HTN, Renal disorder, DM presents to the ED sent in by Primary care physician. Patient has a chronic history of Anemia, has received iron injections weekly by his PCP Dr. Royce Macadamia.  Reports his last blood transfusion was April 2020.  He reports increase in fatigue, reports he feels more tired than usual.  Patient denies any rectal bleeding, vomiting, no known sources of bleeding.  He is currently on aspirin.  He denies any dizziness, shortness of breath, chest pain, fever, abdominal pain or coughs.  The history is provided by the patient and medical records.    Past Medical History:  Diagnosis Date  . Diabetes mellitus without complication ([place])   . Hypertension   . Renal disorder     There are no active problems to display for this patient.   History reviewed. No pertinent surgical history.      Home Medications    Prior to Admission medications   Medication Sig Start Date End Date Taking? Authorizing Provider  amLODipine (NORVASC) 10 MG tablet Take 10 mg by mouth daily. 12/19/18  Yes [provider]  ASPIRIN LOW DOSE 81 MG EC tablet Take 81 mg by mouth daily. 10/14/18  Yes [provider]  furosemide (LASIX) 80 MG tablet Take 80 mg by mouth every Monday, Wednesday, and Friday.  12/19/18  Yes [provider]  glipiZIDE (GLUCOTROL) 5 MG tablet Take 2.5 mg by mouth 2 (two) times a day. 10/14/18  Yes [provider]  metoprolol succinate (TOPROL-XL) 25 MG 24 hr tablet Take 25 mg by mouth daily. 12/13/18  Yes [provider]    Family History No family history on file.  Social History Social History   Tobacco Use  . Smoking status: Not on file  Substance Use Topics  . Alcohol use: Not on  file  . Drug use: Not on file     Allergies   Patient has no known allergies.   Review of Systems Review of Systems  Constitutional: Positive for fatigue. Negative for chills and fever.  HENT: Negative for ear pain and sore throat.   Eyes: Negative for pain and visual disturbance.  Respiratory: Negative for cough and shortness of breath.   Cardiovascular: Negative for chest pain and palpitations.  Gastrointestinal: Negative for abdominal pain and vomiting.  Genitourinary: Negative for dysuria and hematuria.  Musculoskeletal: Negative for arthralgias and back pain.  Skin: Negative for color change and rash.  Neurological: Negative for seizures and syncope.  All other systems reviewed and are negative.    Physical Exam Updated Vital Signs BP (!) 187/93   Pulse 78   Temp 98.1 F (36.7 C) (Oral)   Resp 13   Ht 5\' 11"  (1.803 m)   Wt 89.8 kg   SpO2 98%   BMI 27.62 kg/m   Physical Exam Vitals signs and nursing note reviewed.  Constitutional:      Appearance: He is well-developed.  HENT:     Head: Normocephalic and atraumatic.     Mouth/Throat:     Mouth: Mucous membranes are dry.  Eyes:     General: No scleral icterus.    Pupils: Pupils are equal, round, and reactive to light.  Neck:  Musculoskeletal: Normal range of motion.  Cardiovascular:     Heart sounds: Normal heart sounds.     Comments: No tachycardia.  Pulmonary:     Effort: Pulmonary effort is normal.     Breath sounds: Normal breath sounds. No wheezing.     Comments: Lungs are clear to auscultation. Chest:     Chest wall: No tenderness.     Comments: No tenderness to palpation of the chest. Abdominal:     General: Bowel sounds are normal. There is no distension.     Palpations: Abdomen is soft.     Tenderness: There is no abdominal tenderness.  Musculoskeletal:        General: No tenderness or deformity.  Skin:    General: Skin is warm and dry.     Coloration: Skin is pale.     Comments:  Scatter rash noted throughout his back, likely dry skin.  Neurological:     Mental Status: He is alert and oriented to person, place, and time.      ED Treatments / Results  Labs (all labs ordered are listed, but only abnormal results are displayed) Labs Reviewed  COMPREHENSIVE METABOLIC PANEL - Abnormal; Notable for the following components:      Result Value   Potassium 6.3 (*)    Chloride 113 (*)    CO2 18 (*)    Glucose, Bld 147 (*)    BUN 84 (*)    Creatinine, Ser 6.82 (*)    Calcium 8.8 (*)    Albumin 3.1 (*)    GFR calc non Af Amer 9 (*)    GFR calc Af Amer 11 (*)    All other components within normal limits  CBC - Abnormal; Notable for the following components:   RBC 2.62 (*)    Hemoglobin 6.7 (*)    HCT 22.4 (*)    MCH 25.6 (*)    MCHC 29.9 (*)    RDW 17.8 (*)    All other components within normal limits  POC OCCULT BLOOD, ED  TYPE AND SCREEN  ABO/RH  PREPARE RBC (CROSSMATCH)    EKG None  Radiology No results found.  Procedures .Critical Care Performed by: Janeece Fitting, PA-C Authorized by: Janeece Fitting, PA-C   Critical care provider statement:    Critical care time (minutes):  35   Critical care start time:  12/28/2018 2:00 PM   Critical care end time:  12/28/2018 2:35 PM   Critical care time was exclusive of:  Separately billable procedures and treating other patients   Critical care was necessary to treat or prevent imminent or life-threatening deterioration of the following conditions:  Circulatory failure   Critical care was time spent personally by me on the following activities:  Blood draw for specimens, development of treatment plan with patient or surrogate, discussions with consultants, evaluation of patient's response to treatment, examination of patient, obtaining history from patient or surrogate, ordering and performing treatments and interventions, ordering and review of laboratory studies, ordering and review of radiographic studies, pulse  oximetry, re-evaluation of patient's condition and review of old charts   (including critical care time)  Medications Ordered in ED Medications  sodium zirconium cyclosilicate (LOKELMA) packet 20 g (has no administration in time range)  furosemide (LASIX) injection 80 mg (has no administration in time range)  0.9 %  sodium chloride infusion (Manually program via Guardrails IV Fluids) ( Intravenous New Bag/Given 12/28/18 1309)     Initial Impression / Assessment and Plan /  ED Course  I have reviewed the triage vital signs and the nursing notes.  Pertinent labs & imaging results that were available during my care of the patient were reviewed by me and considered in my medical decision making (see chart for details).  Clinical Course as of Dec 28 1538  Fri Dec 28, 2018  1538 Potassium(!!): 6.3 [JS]    Clinical Course User Index [JS] Janeece Fitting, [place]    Patient with a past medical history of chronic anemia presents to the ED sent in by PCP for low hemoglobin.  Had blood drawn and had a hemoglobin of 6.8, patient is currently on weekly injections of iron, last transfused in April.  No known sources of bleeding.  He is currently a renal failure patient.  Will obtain screening labs, transfuse patient.  CBC today with a hemoglobin of 6.7, no prior level for comparison.CMP showed with a potassium of 6.3, hyperkalemia.  Patient is currently not on dialysis, does have a creatinine of 6.8 no prior results for comparison.  LFTs are within normal limits.  BUN is 84.  EKG showed no changes consistent with hyperkalemia.  Will transfuse patient at this time 1 unit of blood.  Due to his hyperkalemia will consult nephrology as he is currently not on any dialysis. Orthostatic vitals did not show much change.   3:27 PM Spoke to Dr. Moshe Cipro who recommend 20 mg Lokelma along with IV lasix 80 mg, will obtain repeat in labs by her PCP.   Portions of this note were generated with Theatre manager. Dictation errors may occur despite best attempts at proofreading.  Final Clinical Impressions(s) / ED Diagnoses   Final diagnoses:  Other iron deficiency anemia  Hyperkalemia    ED Discharge Orders    None       Janeece Fitting, PA-C 12/28/18 1540    Janeece Fitting, PA-C 12/28/18 1543    Sherwood Gambler, MD 01/01/19 0748    Janeece Fitting, PA-C 03/14/19 1447    Sherwood Gambler, MD 03/18/19 [phone]

## 2018-12-29 LAB — TYPE AND SCREEN
ABO/RH(D): A POS
Antibody Screen: NEGATIVE
Unit division: 0

## 2018-12-29 LAB — BPAM RBC
Blood Product Expiration Date: 202008132359
ISSUE DATE / TIME: 202007171255
Unit Type and Rh: 6200

## 2019-01-02 ENCOUNTER — Encounter (HOSPITAL_COMMUNITY): Payer: Self-pay | Admitting: Emergency Medicine

## 2019-01-03 ENCOUNTER — Other Ambulatory Visit (HOSPITAL_COMMUNITY): Payer: Self-pay

## 2019-01-04 ENCOUNTER — Other Ambulatory Visit: Payer: Self-pay

## 2019-01-04 ENCOUNTER — Ambulatory Visit (HOSPITAL_COMMUNITY)
Admission: RE | Admit: 2019-01-04 | Discharge: 2019-01-04 | Disposition: A | Discharge status: Home / Self Care | Payer: 59 | Source: Ambulatory Visit | Attending: Nephrology | Admitting: Nephrology

## 2019-01-04 VITALS — BP 172/88 | HR 70 | Temp 98.0°F | Resp 18 | Ht 71.0 in | Wt 195.0 lb

## 2019-01-04 DIAGNOSIS — N189 Chronic kidney disease, unspecified: Secondary | ICD-10-CM

## 2019-01-04 DIAGNOSIS — N179 Acute kidney failure, unspecified: Secondary | ICD-10-CM

## 2019-01-04 DIAGNOSIS — D631 Anemia in chronic kidney disease: Secondary | ICD-10-CM | POA: Diagnosis present

## 2019-01-04 DIAGNOSIS — D62 Acute posthemorrhagic anemia: Secondary | ICD-10-CM

## 2019-01-04 DIAGNOSIS — N184 Chronic kidney disease, stage 4 (severe): Secondary | ICD-10-CM | POA: Diagnosis not present

## 2019-01-04 LAB — POCT HEMOGLOBIN-HEMACUE: Hemoglobin: 8.5 g/dL — ABNORMAL LOW (ref 13.0–17.0)

## 2019-01-04 MED ORDER — EPOETIN ALFA-EPBX 10000 UNIT/ML IJ SOLN: 20000.0000 [IU] | INTRAMUSCULAR | Status: DC

## 2019-01-04 MED ORDER — EPOETIN ALFA-EPBX 40000 UNIT/ML IJ SOLN
30000.0000 [IU] | Freq: Once | INTRAMUSCULAR | Status: AC
  Administered 2019-01-04: 30000 [IU] via SUBCUTANEOUS
  Filled 2019-01-04: qty 1

## 2019-01-04 MED ORDER — SODIUM CHLORIDE 0.9 % IV SOLN
510.0000 mg | Freq: Once | INTRAVENOUS | Status: AC
  Administered 2019-01-04: 510 mg via INTRAVENOUS
  Filled 2019-01-04: qty 17

## 2019-01-08 ENCOUNTER — Telehealth: Payer: Self-pay | Admitting: Hematology

## 2019-01-08 NOTE — Telephone Encounter (Signed)
Pt returned my call to schedule a hem appt. 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 Pt has been scheduled to see Dr. Irene Limbo on 8/17 at 10am. He has been made aware to arrive 20 minutes early.

## 2019-01-17 ENCOUNTER — Other Ambulatory Visit: Payer: Self-pay

## 2019-01-17 DIAGNOSIS — N179 Acute kidney failure, unspecified: Secondary | ICD-10-CM

## 2019-01-18 ENCOUNTER — Encounter (HOSPITAL_COMMUNITY): Payer: 59

## 2019-01-18 ENCOUNTER — Telehealth (HOSPITAL_COMMUNITY): Payer: Self-pay | Admitting: Rehabilitation

## 2019-01-18 NOTE — Telephone Encounter (Signed)

## 2019-01-21 ENCOUNTER — Encounter: Payer: Self-pay | Admitting: *Deleted

## 2019-01-21 ENCOUNTER — Ambulatory Visit (INDEPENDENT_AMBULATORY_CARE_PROVIDER_SITE_OTHER)
Admission: RE | Admit: 2019-01-21 | Discharge: 2019-01-21 | Disposition: A | Discharge status: Home / Self Care | Payer: 59 | Source: Ambulatory Visit | Attending: Surgery | Admitting: Surgery

## 2019-01-21 ENCOUNTER — Ambulatory Visit (HOSPITAL_COMMUNITY)
Admission: RE | Admit: 2019-01-21 | Discharge: 2019-01-21 | Disposition: A | Discharge status: Home / Self Care | Payer: 59 | Source: Ambulatory Visit | Attending: Surgery | Admitting: Surgery

## 2019-01-21 ENCOUNTER — Other Ambulatory Visit: Payer: Self-pay | Admitting: *Deleted

## 2019-01-21 ENCOUNTER — Other Ambulatory Visit: Payer: Self-pay

## 2019-01-21 ENCOUNTER — Encounter: Payer: Self-pay | Admitting: Surgery

## 2019-01-21 ENCOUNTER — Ambulatory Visit: Payer: 59 | Admitting: Surgery

## 2019-01-21 VITALS — BP 132/80 | HR 81 | Temp 97.5°F | Resp 20 | Ht 71.0 in | Wt 175.9 lb

## 2019-01-21 DIAGNOSIS — Z992 Dependence on renal dialysis: Secondary | ICD-10-CM | POA: Diagnosis not present

## 2019-01-21 DIAGNOSIS — N179 Acute kidney failure, unspecified: Secondary | ICD-10-CM

## 2019-01-21 DIAGNOSIS — N186 End stage renal disease: Secondary | ICD-10-CM

## 2019-01-21 NOTE — H&P (View-Only) (Signed)
[facility]  Patient name: Bill [name] MRN: [id number] DOB: [birth year] Sex: male   REQUESTING PROVIDER:    Renal   REASON FOR CONSULT:    Dialysis access  HISTORY OF PRESENT ILLNESS:   Bill [name] is a 38 y.o. male, who is referred today for dialysis access.  He is now on dialysis via a catheter.  He is right-handed.  His renal failure secondary to diabetes.  He also suffers from diabetic retinopathy.  He does have a history of stroke.  He takes a statin for hyperlipidemia.  He has had congestive heart failure in the past.  PAST MEDICAL HISTORY    Past Medical History:  Diagnosis Date   Diabetes mellitus without complication (Bill [name])    Diabetic retinopathy (Bill [name])    PDR OU   Hypertension    Renal disorder    Stroke (Bill [name])      FAMILY HISTORY   Family History  Problem Relation Age of Onset   Diabetes Mellitus II Mother    Hypertension Mother    Cataracts Mother    Diabetes Mellitus II Father    Stroke Father    Hypertension Father    Glaucoma Maternal Grandmother    Amblyopia Neg Hx    Blindness Neg Hx    Macular degeneration Neg Hx    Retinal detachment Neg Hx    Strabismus Neg Hx    Retinitis pigmentosa Neg Hx     SOCIAL HISTORY:   Social History   Socioeconomic History   Marital status: Married    Spouse name: Not on file   Number of children: Not on file   Years of education: Not on file   Highest education level: Not on file  Occupational History   Not on file  Social Needs   Financial resource strain: Not on file   Food insecurity    Worry: Not on file    Inability: Not on file   Transportation needs    Medical: Not on file    Non-medical: Not on file  Tobacco Use   Smoking status: Never Smoker   Smokeless tobacco: Never Used  Substance and Sexual Activity   Alcohol use: Not Currently    Comment: Occasionally.   Drug use: Not Currently     Sexual activity: Not on file  Lifestyle   Physical activity    Days per week: Not on file    Minutes per session: Not on file   Stress: Not on file  Relationships   Social connections    Talks on phone: Not on file    Gets together: Not on file    Attends religious service: Not on file    Active member of club or organization: Not on file    Attends meetings of clubs or organizations: Not on file    Relationship status: Not on file   Intimate partner violence    Fear of current or ex partner: Not on file    Emotionally abused: Not on file    Physically abused: Not on file    Forced sexual activity: Not on file  Other Topics Concern   Not on file  Social History Narrative   ** Merged History Encounter **        ALLERGIES:    No Known Allergies  CURRENT MEDICATIONS:    Current Outpatient Medications  Medication Sig Dispense Refill   aspirin 81 MG EC tablet Take 1 tablet (81 mg total) by mouth daily  with breakfast. 30 tablet 11   ASPIRIN LOW DOSE 81 MG EC tablet Take 81 mg by mouth daily.     atorvastatin (LIPITOR) 80 MG tablet Take 1 tablet (80 mg total) by mouth every evening. 30 tablet 12   glipiZIDE (GLUCOTROL) 5 MG tablet Take 0.5 tablets (2.5 mg total) by mouth 2 (two) times daily before a meal. Take for Diabetes----Hold if you Skip a Meal 30 tablet 2   sodium bicarbonate 650 MG tablet TK 1 T PO BID     No current facility-administered medications for this visit.     REVIEW OF SYSTEMS:   [X]  denotes positive finding, [ ]  denotes negative finding Cardiac  Comments:  Chest pain or chest pressure:    Shortness of breath upon exertion:    Short of breath when lying flat:    Irregular heart rhythm:        Vascular    Pain in calf, thigh, or hip brought on by ambulation:    Pain in feet at night that wakes you up from your sleep:     Blood clot in your veins:    Leg swelling:         Pulmonary    Oxygen at home:    Productive cough:      Wheezing:         Neurologic    Sudden weakness in arms or legs:     Sudden numbness in arms or legs:     Sudden onset of difficulty speaking or slurred speech:    Temporary loss of vision in one eye:     Problems with dizziness:         Gastrointestinal    Blood in stool:      Vomited blood:         Genitourinary    Burning when urinating:     Blood in urine:        Psychiatric    Major depression:         Hematologic    Bleeding problems:    Problems with blood clotting too easily:        Skin    Rashes or ulcers:        Constitutional    Fever or chills:     PHYSICAL EXAM:   Vitals:   01/21/19 1028  BP: 132/80  Pulse: 81  Resp: 20  Temp: (!) 97.5 F (36.4 C)  TempSrc: Temporal  SpO2: 97%  Weight: 175 lb 14.8 oz (79.8 kg)  Height: 5\' 11"  (1.803 m)    GENERAL: The patient is a well-nourished male, in no acute distress. The vital signs are documented above. CARDIAC: There is a regular rate and rhythm.  VASCULAR: Palpable radial pulses bilaterally PULMONARY: Nonlabored respirations MUSCULOSKELETAL: There are no major deformities or cyanosis. NEUROLOGIC: No focal weakness or paresthesias are detected. SKIN: There are no ulcers or rashes noted. PSYCHIATRIC: The patient has a normal affect.  STUDIES:   I have ordered and reviewed the following:  Arterial:  Right: No obstruction visualized in the right upper extremity. Left: No obstruction visualized in the left upper extremity.  +-----------------+-------------+----------+--------------+  Right Cephalic    Diameter (cm) Depth (cm)    Findings     +-----------------+-------------+----------+--------------+  Shoulder              0.12                                 +-----------------+-------------+----------+--------------+  Prox upper arm                             not visualized  +-----------------+-------------+----------+--------------+  Mid upper arm                              not  visualized  +-----------------+-------------+----------+--------------+  Dist upper arm                             not visualized  +-----------------+-------------+----------+--------------+  Antecubital fossa     0.17                                 +-----------------+-------------+----------+--------------+  Prox forearm          0.24                                 +-----------------+-------------+----------+--------------+  Mid forearm           0.22                                 +-----------------+-------------+----------+--------------+  Dist forearm          0.29                                 +-----------------+-------------+----------+--------------+  +-----------------+-------------+----------+--------+  Right Basilic     Diameter (cm) Depth (cm) Findings  +-----------------+-------------+----------+--------+  Mid upper arm         0.32                           +-----------------+-------------+----------+--------+  Dist upper arm        0.29                           +-----------------+-------------+----------+--------+  Antecubital fossa     0.28                           +-----------------+-------------+----------+--------+  Prox forearm          0.24                           +-----------------+-------------+----------+--------+  +-----------------+-------------+----------+---------+  Left Cephalic     Diameter (cm) Depth (cm) Findings   +-----------------+-------------+----------+---------+  Shoulder              0.22                            +-----------------+-------------+----------+---------+  Prox upper arm        0.20                            +-----------------+-------------+----------+---------+  Mid upper arm         0.20                            +-----------------+-------------+----------+---------+  Dist upper arm        0.20                            +-----------------+-------------+----------+---------+  Antecubital fossa     0.31                             +-----------------+-------------+----------+---------+  Prox forearm          0.15                            +-----------------+-------------+----------+---------+  Mid forearm           0.21                            +-----------------+-------------+----------+---------+  Dist forearm          0.21                 branching  +-----------------+-------------+----------+---------+  +-----------------+-------------+----------+--------+  Left Basilic      Diameter (cm) Depth (cm) Findings  +-----------------+-------------+----------+--------+  Mid upper arm         0.27                           +-----------------+-------------+----------+--------+  Dist upper arm        0.24                           +-----------------+-------------+----------+--------+  Antecubital fossa     0.20                           +-----------------+-------------+----------+--------+  Prox forearm          0.18                           +-----------------+-------------+----------+--------+   ASSESSMENT and PLAN   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  His veins are small by ultrasound in the office.  I told him that I would reevaluate his veins in the operating room and hopefully he will have something that could be used to create a fistula.  We discussed the possibility of a 2 staged operation as well as the possibility of a forearm versus upper arm graft.  The risks and benefits of surgery were discussed with the patient including the risk of steal, graft failure, and the need for future interventions.  All of his questions were answered.  I am scheduling him for this Friday, August 14.  I also encouraged him to move forward with the transplant process.   [name], MD, FACS [facility] [phone] Pager [phone]

## 2019-01-21 NOTE — Progress Notes (Signed)
Vascular and Vein Specialist of [facility]  Patient name: Bill [name] MRN: [id number] DOB: [birth year] Sex: male   REQUESTING PROVIDER:    Renal   REASON FOR CONSULT:    Dialysis access  HISTORY OF PRESENT ILLNESS:   Bill [name] is a 39 y.o. male, who is referred today for dialysis access.  He is now on dialysis via a catheter.  He is right-handed.  His renal failure secondary to diabetes.  He also suffers from diabetic retinopathy.  He does have a history of stroke.  He takes a statin for hyperlipidemia.  He has had congestive heart failure in the past.  PAST MEDICAL HISTORY    Past Medical History:  Diagnosis Date   Diabetes mellitus without complication ([place])    Diabetic retinopathy ([place])    PDR OU   Hypertension    Renal disorder    Stroke ([place])      FAMILY HISTORY   Family History  Problem Relation Age of Onset   Diabetes Mellitus II Mother    Hypertension Mother    Cataracts Mother    Diabetes Mellitus II Father    Stroke Father    Hypertension Father    Glaucoma Maternal Grandmother    Amblyopia Neg Hx    Blindness Neg Hx    Macular degeneration Neg Hx    Retinal detachment Neg Hx    Strabismus Neg Hx    Retinitis pigmentosa Neg Hx     SOCIAL HISTORY:   Social History   Socioeconomic History   Marital status: Married    Spouse name: Not on file   Number of children: Not on file   Years of education: Not on file   Highest education level: Not on file  Occupational History   Not on file  Social Needs   Financial resource strain: Not on file   Food insecurity    Worry: Not on file    Inability: Not on file   Transportation needs    Medical: Not on file    Non-medical: Not on file  Tobacco Use   Smoking status: Never Smoker   Smokeless tobacco: Never Used  Substance and Sexual Activity   Alcohol use: Not Currently    Comment: Occasionally.   Drug use: Not Currently     Sexual activity: Not on file  Lifestyle   Physical activity    Days per week: Not on file    Minutes per session: Not on file   Stress: Not on file  Relationships   Social connections    Talks on phone: Not on file    Gets together: Not on file    Attends religious service: Not on file    Active member of club or organization: Not on file    Attends meetings of clubs or organizations: Not on file    Relationship status: Not on file   Intimate partner violence    Fear of current or ex partner: Not on file    Emotionally abused: Not on file    Physically abused: Not on file    Forced sexual activity: Not on file  Other Topics Concern   Not on file  Social History Narrative   ** Merged History Encounter **        ALLERGIES:    No Known Allergies  CURRENT MEDICATIONS:    Current Outpatient Medications  Medication Sig Dispense Refill   aspirin 81 MG EC tablet Take 1 tablet (81 mg total) by mouth daily  with breakfast. 30 tablet 11   ASPIRIN LOW DOSE 81 MG EC tablet Take 81 mg by mouth daily.     atorvastatin (LIPITOR) 80 MG tablet Take 1 tablet (80 mg total) by mouth every evening. 30 tablet 12   glipiZIDE (GLUCOTROL) 5 MG tablet Take 0.5 tablets (2.5 mg total) by mouth 2 (two) times daily before a meal. Take for Diabetes----Hold if you Skip a Meal 30 tablet 2   sodium bicarbonate 650 MG tablet TK 1 T PO BID     No current facility-administered medications for this visit.     REVIEW OF SYSTEMS:   [X]  denotes positive finding, [ ]  denotes negative finding Cardiac  Comments:  Chest pain or chest pressure:    Shortness of breath upon exertion:    Short of breath when lying flat:    Irregular heart rhythm:        Vascular    Pain in calf, thigh, or hip brought on by ambulation:    Pain in feet at night that wakes you up from your sleep:     Blood clot in your veins:    Leg swelling:         Pulmonary    Oxygen at home:    Productive cough:      Wheezing:         Neurologic    Sudden weakness in arms or legs:     Sudden numbness in arms or legs:     Sudden onset of difficulty speaking or slurred speech:    Temporary loss of vision in one eye:     Problems with dizziness:         Gastrointestinal    Blood in stool:      Vomited blood:         Genitourinary    Burning when urinating:     Blood in urine:        Psychiatric    Major depression:         Hematologic    Bleeding problems:    Problems with blood clotting too easily:        Skin    Rashes or ulcers:        Constitutional    Fever or chills:     PHYSICAL EXAM:   Vitals:   01/21/19 1028  BP: 132/80  Pulse: 81  Resp: 20  Temp: (!) 97.5 F (36.4 C)  TempSrc: Temporal  SpO2: 97%  Weight: 175 lb 14.8 oz (79.8 kg)  Height: 5\' 11"  (1.803 m)    GENERAL: The patient is a well-nourished male, in no acute distress. The vital signs are documented above. CARDIAC: There is a regular rate and rhythm.  VASCULAR: Palpable radial pulses bilaterally PULMONARY: Nonlabored respirations MUSCULOSKELETAL: There are no major deformities or cyanosis. NEUROLOGIC: No focal weakness or paresthesias are detected. SKIN: There are no ulcers or rashes noted. PSYCHIATRIC: The patient has a normal affect.  STUDIES:   I have ordered and reviewed the following:  Arterial:  Right: No obstruction visualized in the right upper extremity. Left: No obstruction visualized in the left upper extremity.  +-----------------+-------------+----------+--------------+  Right Cephalic    Diameter (cm) Depth (cm)    Findings     +-----------------+-------------+----------+--------------+  Shoulder              0.12                                 +-----------------+-------------+----------+--------------+  Prox upper arm                             not visualized  +-----------------+-------------+----------+--------------+  Mid upper arm                              not  visualized  +-----------------+-------------+----------+--------------+  Dist upper arm                             not visualized  +-----------------+-------------+----------+--------------+  Antecubital fossa     0.17                                 +-----------------+-------------+----------+--------------+  Prox forearm          0.24                                 +-----------------+-------------+----------+--------------+  Mid forearm           0.22                                 +-----------------+-------------+----------+--------------+  Dist forearm          0.29                                 +-----------------+-------------+----------+--------------+  +-----------------+-------------+----------+--------+  Right Basilic     Diameter (cm) Depth (cm) Findings  +-----------------+-------------+----------+--------+  Mid upper arm         0.32                           +-----------------+-------------+----------+--------+  Dist upper arm        0.29                           +-----------------+-------------+----------+--------+  Antecubital fossa     0.28                           +-----------------+-------------+----------+--------+  Prox forearm          0.24                           +-----------------+-------------+----------+--------+  +-----------------+-------------+----------+---------+  Left Cephalic     Diameter (cm) Depth (cm) Findings   +-----------------+-------------+----------+---------+  Shoulder              0.22                            +-----------------+-------------+----------+---------+  Prox upper arm        0.20                            +-----------------+-------------+----------+---------+  Mid upper arm         0.20                            +-----------------+-------------+----------+---------+  Dist upper arm        0.20                            +-----------------+-------------+----------+---------+  Antecubital fossa     0.31                             +-----------------+-------------+----------+---------+  Prox forearm          0.15                            +-----------------+-------------+----------+---------+  Mid forearm           0.21                            +-----------------+-------------+----------+---------+  Dist forearm          0.21                 branching  +-----------------+-------------+----------+---------+  +-----------------+-------------+----------+--------+  Left Basilic      Diameter (cm) Depth (cm) Findings  +-----------------+-------------+----------+--------+  Mid upper arm         0.27                           +-----------------+-------------+----------+--------+  Dist upper arm        0.24                           +-----------------+-------------+----------+--------+  Antecubital fossa     0.20                           +-----------------+-------------+----------+--------+  Prox forearm          0.18                           +-----------------+-------------+----------+--------+   ASSESSMENT and PLAN   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  His veins are small by ultrasound in the office.  I told him that I would reevaluate his veins in the operating room and hopefully he will have something that could be used to create a fistula.  We discussed the possibility of a 2 staged operation as well as the possibility of a forearm versus upper arm graft.  The risks and benefits of surgery were discussed with the patient including the risk of steal, graft failure, and the need for future interventions.  All of his questions were answered.  I am scheduling him for this Friday, August 14.  I also encouraged him to move forward with the transplant process.   [name], MD, FACS [facility] Specialists of [facility] [phone] Pager [phone]

## 2019-01-22 ENCOUNTER — Other Ambulatory Visit (HOSPITAL_COMMUNITY)
Admission: RE | Admit: 2019-01-22 | Discharge: 2019-01-22 | Disposition: A | Discharge status: Home / Self Care | Payer: 59 | Source: Ambulatory Visit | Attending: Surgery | Admitting: Surgery

## 2019-01-22 ENCOUNTER — Other Ambulatory Visit: Payer: Self-pay

## 2019-01-22 DIAGNOSIS — Z20822 Contact with and (suspected) exposure to covid-19: Secondary | ICD-10-CM

## 2019-01-22 DIAGNOSIS — Z01812 Encounter for preprocedural laboratory examination: Secondary | ICD-10-CM | POA: Insufficient documentation

## 2019-01-22 DIAGNOSIS — Z20828 Contact with and (suspected) exposure to other viral communicable diseases: Secondary | ICD-10-CM | POA: Diagnosis not present

## 2019-01-22 LAB — SARS CORONAVIRUS 2 (TAT 6-24 HRS): SARS Coronavirus 2: NEGATIVE

## 2019-01-23 ENCOUNTER — Encounter (HOSPITAL_COMMUNITY): Payer: Self-pay | Admitting: *Deleted

## 2019-01-23 ENCOUNTER — Encounter: Payer: Self-pay | Admitting: *Deleted

## 2019-01-23 ENCOUNTER — Other Ambulatory Visit: Payer: Self-pay

## 2019-01-23 ENCOUNTER — Ambulatory Visit: Payer: Self-pay | Admitting: Internal Medicine

## 2019-01-23 NOTE — Progress Notes (Signed)
Patient denies shortness of breath, fever, cough and chest pain. PCP - Dr. Lelon Frohlich Cardiologist - denies  Chest x-ray - 1 view 10/06/18 EKG - 12/28/18 Stress Test - denies ECHO - 10/08/18  ( Epic) Cardiac Cath - denies  Sleep Study - denies CPAP - n/a  Fasting Blood Sugar - 130-170 Checks Blood Sugar ( varies)  Blood Thinner Instructions: n/a Aspirin Instructions: none  Pt made aware to stop taking vitamins, fish oil and herbal medications. Do not take any NSAIDs ie: Ibuprofen, Advil, Naproxen (Aleve), Motrin, BC and Goody Powder.  Pt made aware to hold Glipizide the night before surgery and the morning of surgery.  Pt made aware to check BG every 2 hours prior to arrival to hospital on DOS. Pt made aware to treat a BG < 70 with  4 ounces of apple or cranberry juice, wait 15 minutes after intervention to recheck BG, if BG remains < 70, call Short Stay unit to speak with a nurse.  Patient verbalized understanding of all  Instructions.  PA, Anesthesiology, asked to review pt abnormal chest x ray and EKG.

## 2019-01-23 NOTE — Progress Notes (Signed)
Phone call to patient and letter faxed to [facility] with arrival time change to 9:45 am for surgery on 01/25/2019.

## 2019-01-24 ENCOUNTER — Encounter (HOSPITAL_COMMUNITY): Payer: Self-pay | Admitting: Physician Assistant

## 2019-01-24 NOTE — Anesthesia Preprocedure Evaluation (Addendum)
Anesthesia Evaluation  Patient identified by MRN, date of birth, ID band Patient awake    Reviewed: Allergy & Precautions, NPO status , Patient's Chart, lab work & pertinent test results  History of Anesthesia Complications (+) PONV and history of anesthetic complications  Airway Mallampati: II  TM Distance: >3 FB Neck ROM: Full    Dental  (+) Dental Advisory Given   Pulmonary neg pulmonary ROS,    Pulmonary exam normal breath sounds clear to auscultation       Cardiovascular hypertension, + Peripheral Vascular Disease  Normal cardiovascular exam Rhythm:Regular Rate:Normal     Neuro/Psych  Headaches, CVA negative psych ROS   GI/Hepatic negative GI ROS, Neg liver ROS,   Endo/Other  negative endocrine ROSdiabetes  Renal/GU ESRF and DialysisRenal disease     Musculoskeletal negative musculoskeletal ROS (+)   Abdominal   Peds  Hematology  (+) Blood dyscrasia, anemia ,   Anesthesia Other Findings   Reproductive/Obstetrics negative OB ROS                            Anesthesia Physical Anesthesia Plan  ASA: IV  Anesthesia Plan: MAC   Post-op Pain Management:    Induction: Intravenous  PONV Risk Score and Plan: 4 or greater and Ondansetron, Dexamethasone, Midazolam, Treatment may vary due to age or medical condition and Propofol infusion  Airway Management Planned: Mask  Additional Equipment:   Intra-op Plan:   Post-operative Plan:   Informed Consent: I have reviewed the patients History and Physical, chart, labs and discussed the procedure including the risks, benefits and alternatives for the proposed anesthesia with the patient or authorized representative who has indicated his/her understanding and acceptance.     Dental advisory given  Plan Discussed with: CRNA  Anesthesia Plan Comments: (Recently admitted 4/25-10/14/18 for AKI on CKD, anemia, and acute HFpEF. Was found to  have a hemoglobin of 6 and creatinine of 3.5 and had a kidney biopsy that was complicated by a post biopsy hematoma.  He was transfused a total of 4 units of PRBCs during that hospitalization. Echo showed normal EF with diastolic dysfunction, he was acutely fluid overloaded at the time.  He is now dialyzing via catheter T-Th-S via catheter. Continues to have significant anemia, is receiving retacrit and feraheme, followed by PCP and nephrology.  TTE 10/08/18  1. The left ventricle has normal systolic function, with an ejection fraction of 55-60%. The cavity size was normal. There is severely increased left ventricular wall thickness. Left ventricular diastolic Doppler parameters are consistent with  pseudonormalization. No evidence of left ventricular regional wall motion abnormalities. E/medial e' > 15 suggests LV end diastolic pressure at least 20 mm Hg.  2. The right ventricle has normal systolic function. The cavity was normal. There is no increase in right ventricular wall thickness.  3. Left atrial size was mild-moderately dilated.  4. Right atrial size was mildly dilated.  5. There is a small to moderate pericardial effusion. There is RA indentation but no RV diastolic collapse or respirophasic variation of the mitral inflow E velocity. The IVC is not significantly dilated. There is no evidence for tamponade.  6. Mitral valve regurgitation is moderate by color flow Doppler. No evidence of mitral valve stenosis.  7. The aortic valve is tricuspid. Aortic valve regurgitation is trivial by color flow Doppler. No stenosis of the aortic valve.  8. The aortic root and ascending aorta are normal in size and structure.  9. The IVC is normal in size. PA systolic pressure 54 mmHg.)       Anesthesia Quick Evaluation

## 2019-01-25 ENCOUNTER — Ambulatory Visit (HOSPITAL_COMMUNITY): Payer: 59 | Admitting: Physician Assistant

## 2019-01-25 ENCOUNTER — Ambulatory Visit (HOSPITAL_COMMUNITY)
Admission: RE | Admit: 2019-01-25 | Discharge: 2019-01-25 | Disposition: A | Discharge status: Home / Self Care | Payer: 59 | Source: Ambulatory Visit | Attending: Surgery | Admitting: Surgery

## 2019-01-25 ENCOUNTER — Encounter (HOSPITAL_COMMUNITY): Payer: Self-pay

## 2019-01-25 ENCOUNTER — Other Ambulatory Visit: Payer: Self-pay

## 2019-01-25 ENCOUNTER — Encounter (HOSPITAL_COMMUNITY)
Admission: RE | Disposition: A | Discharge status: Home / Self Care | Payer: Self-pay | Source: Ambulatory Visit | Attending: Surgery

## 2019-01-25 DIAGNOSIS — E1122 Type 2 diabetes mellitus with diabetic chronic kidney disease: Secondary | ICD-10-CM | POA: Insufficient documentation

## 2019-01-25 DIAGNOSIS — E113593 Type 2 diabetes mellitus with proliferative diabetic retinopathy without macular edema, bilateral: Secondary | ICD-10-CM | POA: Insufficient documentation

## 2019-01-25 DIAGNOSIS — Z992 Dependence on renal dialysis: Secondary | ICD-10-CM | POA: Insufficient documentation

## 2019-01-25 DIAGNOSIS — I509 Heart failure, unspecified: Secondary | ICD-10-CM | POA: Insufficient documentation

## 2019-01-25 DIAGNOSIS — E785 Hyperlipidemia, unspecified: Secondary | ICD-10-CM | POA: Insufficient documentation

## 2019-01-25 DIAGNOSIS — Z7982 Long term (current) use of aspirin: Secondary | ICD-10-CM | POA: Diagnosis not present

## 2019-01-25 DIAGNOSIS — I132 Hypertensive heart and chronic kidney disease with heart failure and with stage 5 chronic kidney disease, or end stage renal disease: Secondary | ICD-10-CM | POA: Diagnosis not present

## 2019-01-25 DIAGNOSIS — Z7984 Long term (current) use of oral hypoglycemic drugs: Secondary | ICD-10-CM | POA: Insufficient documentation

## 2019-01-25 DIAGNOSIS — N186 End stage renal disease: Secondary | ICD-10-CM | POA: Insufficient documentation

## 2019-01-25 DIAGNOSIS — Z8673 Personal history of transient ischemic attack (TIA), and cerebral infarction without residual deficits: Secondary | ICD-10-CM | POA: Diagnosis not present

## 2019-01-25 DIAGNOSIS — E1151 Type 2 diabetes mellitus with diabetic peripheral angiopathy without gangrene: Secondary | ICD-10-CM | POA: Insufficient documentation

## 2019-01-25 DIAGNOSIS — Z79899 Other long term (current) drug therapy: Secondary | ICD-10-CM | POA: Insufficient documentation

## 2019-01-25 HISTORY — DX: Other specified postprocedural states: Z98.890

## 2019-01-25 HISTORY — DX: Nausea with vomiting, unspecified: R11.2

## 2019-01-25 HISTORY — DX: Anemia, unspecified: D64.9

## 2019-01-25 HISTORY — PX: AV FISTULA PLACEMENT: SHX1204

## 2019-01-25 HISTORY — DX: End stage renal disease: N18.6

## 2019-01-25 LAB — POCT I-STAT 4, (NA,K, GLUC, HGB,HCT)
Glucose, Bld: 136 mg/dL — ABNORMAL HIGH (ref 70–99)
HCT: 33 % — ABNORMAL LOW (ref 39.0–52.0)
Hemoglobin: 11.2 g/dL — ABNORMAL LOW (ref 13.0–17.0)
Potassium: 5 mmol/L (ref 3.5–5.1)
Sodium: 133 mmol/L — ABNORMAL LOW (ref 135–145)

## 2019-01-25 LAB — GLUCOSE, CAPILLARY
Glucose-Capillary: 133 mg/dL — ABNORMAL HIGH (ref 70–99)
Glucose-Capillary: 117 mg/dL — ABNORMAL HIGH (ref 70–99)

## 2019-01-25 SURGERY — ARTERIOVENOUS (AV) FISTULA CREATION
Anesthesia: Monitor Anesthesia Care | Site: Arm Upper | Laterality: Left

## 2019-01-25 MED ORDER — SODIUM CHLORIDE 0.9 % IV SOLN
INTRAVENOUS | Status: DC | PRN
  Administered 2019-01-25: 25 ug/min via INTRAVENOUS

## 2019-01-25 MED ORDER — LIDOCAINE HCL (CARDIAC) PF 100 MG/5ML IV SOSY
PREFILLED_SYRINGE | INTRAVENOUS | Status: DC | PRN
  Administered 2019-01-25: 80 mg via INTRATRACHEAL

## 2019-01-25 MED ORDER — FENTANYL CITRATE (PF) 250 MCG/5ML IJ SOLN
INTRAMUSCULAR | Status: DC | PRN
  Administered 2019-01-25 (×3): 25 ug via INTRAVENOUS

## 2019-01-25 MED ORDER — ONDANSETRON HCL 4 MG/2ML IJ SOLN
INTRAMUSCULAR | Status: AC
  Filled 2019-01-25: qty 2

## 2019-01-25 MED ORDER — FENTANYL CITRATE (PF) 250 MCG/5ML IJ SOLN
INTRAMUSCULAR | Status: AC
  Filled 2019-01-25: qty 5

## 2019-01-25 MED ORDER — SODIUM CHLORIDE 0.9 % IV SOLN
INTRAVENOUS | Status: DC
  Administered 2019-01-25: 10:00:00 via INTRAVENOUS

## 2019-01-25 MED ORDER — DEXAMETHASONE SODIUM PHOSPHATE 10 MG/ML IJ SOLN
INTRAMUSCULAR | Status: DC | PRN
  Administered 2019-01-25: 5 mg via INTRAVENOUS

## 2019-01-25 MED ORDER — LIDOCAINE 2% (20 MG/ML) 5 ML SYRINGE
INTRAMUSCULAR | Status: AC
  Filled 2019-01-25: qty 5

## 2019-01-25 MED ORDER — OXYCODONE-ACETAMINOPHEN 5-325 MG PO TABS: 1.0000 | ORAL_TABLET | ORAL | 0 refills | Status: DC | PRN

## 2019-01-25 MED ORDER — CEFAZOLIN SODIUM-DEXTROSE 2-4 GM/100ML-% IV SOLN
2.0000 g | INTRAVENOUS | Status: AC
  Administered 2019-01-25: 2 g via INTRAVENOUS

## 2019-01-25 MED ORDER — OXYCODONE HCL 5 MG/5ML PO SOLN: 5.0000 mg | Freq: Once | ORAL | Status: DC | PRN

## 2019-01-25 MED ORDER — CHLORHEXIDINE GLUCONATE 4 % EX LIQD: 60.0000 mL | Freq: Once | CUTANEOUS | Status: DC

## 2019-01-25 MED ORDER — ONDANSETRON HCL 4 MG/2ML IJ SOLN
INTRAMUSCULAR | Status: DC | PRN
  Administered 2019-01-25: 4 mg via INTRAVENOUS

## 2019-01-25 MED ORDER — PROPOFOL 10 MG/ML IV BOLUS
INTRAVENOUS | Status: AC
  Filled 2019-01-25: qty 40

## 2019-01-25 MED ORDER — OXYCODONE HCL 5 MG PO TABS: 5.0000 mg | ORAL_TABLET | Freq: Once | ORAL | Status: DC | PRN

## 2019-01-25 MED ORDER — LIDOCAINE-EPINEPHRINE (PF) 1 %-1:200000 IJ SOLN
INTRAMUSCULAR | Status: DC | PRN
  Administered 2019-01-25: 30 mL

## 2019-01-25 MED ORDER — 0.9 % SODIUM CHLORIDE (POUR BTL) OPTIME
TOPICAL | Status: DC | PRN
  Administered 2019-01-25: 1000 mL

## 2019-01-25 MED ORDER — SODIUM CHLORIDE 0.9 % IV SOLN
INTRAVENOUS | Status: DC | PRN
  Administered 2019-01-25: 500 mL

## 2019-01-25 MED ORDER — HYDROMORPHONE HCL 1 MG/ML IJ SOLN: 0.2500 mg | INTRAMUSCULAR | Status: DC | PRN

## 2019-01-25 MED ORDER — MIDAZOLAM HCL 2 MG/2ML IJ SOLN
INTRAMUSCULAR | Status: AC
  Filled 2019-01-25: qty 2

## 2019-01-25 MED ORDER — PROPOFOL 500 MG/50ML IV EMUL
INTRAVENOUS | Status: DC | PRN
  Administered 2019-01-25: 75 ug/kg/min via INTRAVENOUS

## 2019-01-25 MED ORDER — PROMETHAZINE HCL 25 MG/ML IJ SOLN: 6.2500 mg | INTRAMUSCULAR | Status: DC | PRN

## 2019-01-25 MED ORDER — DEXAMETHASONE SODIUM PHOSPHATE 10 MG/ML IJ SOLN
INTRAMUSCULAR | Status: AC
  Filled 2019-01-25: qty 1

## 2019-01-25 MED ORDER — MEPERIDINE HCL 25 MG/ML IJ SOLN: 6.2500 mg | INTRAMUSCULAR | Status: DC | PRN

## 2019-01-25 MED ORDER — MIDAZOLAM HCL 2 MG/2ML IJ SOLN
INTRAMUSCULAR | Status: DC | PRN
  Administered 2019-01-25: 2 mg via INTRAVENOUS

## 2019-01-25 SURGICAL SUPPLY — 32 items
ARMBAND PINK RESTRICT EXTREMIT (MISCELLANEOUS) ×4 IMPLANT
CANISTER SUCT 3000ML PPV (MISCELLANEOUS) ×2 IMPLANT
CLIP VESOCCLUDE MED 6/CT (CLIP) ×2 IMPLANT
CLIP VESOCCLUDE SM WIDE 6/CT (CLIP) ×2 IMPLANT
COVER PROBE W GEL 5X96 (DRAPES) ×2 IMPLANT
COVER WAND RF STERILE (DRAPES) ×2 IMPLANT
DERMABOND ADVANCED (GAUZE/BANDAGES/DRESSINGS) ×1
DERMABOND ADVANCED .7 DNX12 (GAUZE/BANDAGES/DRESSINGS) ×1 IMPLANT
ELECT REM PT RETURN 9FT ADLT (ELECTROSURGICAL) ×2
ELECTRODE REM PT RTRN 9FT ADLT (ELECTROSURGICAL) ×1 IMPLANT
GLOVE BIOGEL PI IND STRL 7.5 (GLOVE) ×1 IMPLANT
GLOVE BIOGEL PI INDICATOR 7.5 (GLOVE) ×1
GLOVE SURG SS PI 7.5 STRL IVOR (GLOVE) ×2 IMPLANT
GOWN STRL REUS W/ TWL LRG LVL3 (GOWN DISPOSABLE) ×2 IMPLANT
GOWN STRL REUS W/ TWL XL LVL3 (GOWN DISPOSABLE) ×1 IMPLANT
GOWN STRL REUS W/TWL LRG LVL3 (GOWN DISPOSABLE) ×2
GOWN STRL REUS W/TWL XL LVL3 (GOWN DISPOSABLE) ×2
HEMOSTAT SNOW SURGICEL 2X4 (HEMOSTASIS) IMPLANT
KIT BASIN OR (CUSTOM PROCEDURE TRAY) ×2 IMPLANT
KIT TURNOVER KIT B (KITS) ×2 IMPLANT
NS IRRIG 1000ML POUR BTL (IV SOLUTION) ×2 IMPLANT
PACK CV ACCESS (CUSTOM PROCEDURE TRAY) ×2 IMPLANT
PAD ARMBOARD 7.5X6 YLW CONV (MISCELLANEOUS) ×4 IMPLANT
SUT PROLENE 6 0 CC (SUTURE) ×3 IMPLANT
SUT SILK 3 0 (SUTURE) ×1
SUT SILK 3-0 18XBRD TIE 12 (SUTURE) IMPLANT
SUT VIC AB 3-0 SH 27 (SUTURE) ×2
SUT VIC AB 3-0 SH 27X BRD (SUTURE) ×1 IMPLANT
SUT VICRYL 4-0 PS2 18IN ABS (SUTURE) ×2 IMPLANT
TOWEL GREEN STERILE (TOWEL DISPOSABLE) ×2 IMPLANT
UNDERPAD 30X30 (UNDERPADS AND DIAPERS) ×2 IMPLANT
WATER STERILE IRR 1000ML POUR (IV SOLUTION) ×2 IMPLANT

## 2019-01-25 NOTE — Interval H&P Note (Signed)
History and Physical Interval Note:  01/25/2019 12:59 PM  Bill [name]  has presented today for surgery, with the diagnosis of END STAGE RENAL DISEASE FOR HEMODIALYSIS ACCESS.  The various methods of treatment have been discussed with the patient and family. After consideration of risks, benefits and other options for treatment, the patient has consented to  Procedure(s): ARTERIOVENOUS (AV) FISTULA CREATION VERSUS ARTERIOVENOUS GRAFT LEFT ARM (Left) as a surgical intervention.  The patient's history has been reviewed, patient examined, no change in status, stable for surgery.  I have reviewed the patient's chart and labs.  Questions were answered to the patient's satisfaction.     Annamarie Major

## 2019-01-25 NOTE — Anesthesia Postprocedure Evaluation (Signed)
Anesthesia Post Note  Patient: Bill [name]  Procedure(s) Performed: ARTERIOVENOUS (AV) FISTULA CREATION  LEFT UPPER  ARM (Left Arm Upper)     Patient location during evaluation: PACU Anesthesia Type: MAC Level of consciousness: awake and alert Pain management: pain level controlled Vital Signs Assessment: post-procedure vital signs reviewed and stable Respiratory status: spontaneous breathing Cardiovascular status: stable Anesthetic complications: no    Last Vitals:  Vitals:   01/25/19 1515 01/25/19 1532  BP: (!) 153/76 (!) 154/98  Pulse: 81 80  Resp: 11 17  Temp: 36.4 C   SpO2: 98% 98%    Last Pain:  Vitals:   01/25/19 1532  TempSrc:   PainSc: 0-No pain                 Nolon Nations

## 2019-01-25 NOTE — Transfer of Care (Signed)
Immediate Anesthesia Transfer of Care Note  Patient: Bill [name]  Procedure(s) Performed: ARTERIOVENOUS (AV) FISTULA CREATION  LEFT UPPER  ARM (Left Arm Upper)  Patient Location: PACU  Anesthesia Type:MAC  Level of Consciousness: awake, drowsy and patient cooperative  Airway & Oxygen Therapy: Patient Spontanous Breathing  Post-op Assessment: Report given to RN and Post -op Vital signs reviewed and stable  Post vital signs: Reviewed and stable  Last Vitals:  Vitals Value Taken Time  BP 153/76 01/25/19 1515  Temp 36.4 C 01/25/19 1515  Pulse 83 01/25/19 1519  Resp 18 01/25/19 1519  SpO2 98 % 01/25/19 1519  Vitals shown include unvalidated device data.  Last Pain:  Vitals:   01/25/19 1515  TempSrc:   PainSc: 0-No pain         Complications: No apparent anesthesia complications

## 2019-01-25 NOTE — Op Note (Signed)
    Patient name: Arvind Mexicano MRN: [id number] DOB: 04/07/1981 Sex: male  01/25/2019 Pre-operative Diagnosis: ESRD Post-operative diagnosis:  Same Surgeon:  Annamarie Major Assistants:  Gaye Alken Procedure:   First Stage left basilic vein transposition Anesthesia:  MAC Blood Loss:  Minimal Specimens:  none  Findings:  67m basilic vein, 38martery  Indications: This is a [age]ear old gentleman who has recently started dialysis via catheter.  He is right-handed.  He comes in today for fistula versus graft.  Procedure:  The patient was identified in the holding area and [facility]  The patient was then placed supine on the table. MAC anesthesia was administered.  The patient was prepped and draped in the usual sterile fashion.  A time out was called and antibiotics were administered.  Ultrasound was used to evaluate the surface veins in the left arm.  The cephalic vein was diminutive.  The basilic vein appeared to be acceptable for use as a fistula measuring 3 mm.  1% lidocaine was used for local anesthesia.  An oblique incision was made just proximal to the antecubital crease.  First dissected out the brachial artery.  This was disease-free.  It is mobilized proximally distally and encircled with Vesseloops.  It measured about 3 mm.  I then dissected out the basilic vein.  Side branches were ligated between silk ties.  The vein was very quick to spasm.  Once I had fully mobilized, it was marked for orientation and ligated distally with a 3-0 silk tie.  The vein distended nicely with heparin saline to approximately 3 mm.  I elected to proceed with fistula creation.  The brachial artery was occluded.  A #11 blade was used to make an arteriotomy this was extended longitudinally with Potts scissors.  The vein was then cut to the appropriate length and spatulated to fit the size of the arteriotomy.  A running anastomosis was agreed with 6-0 Prolene.  Prior to completion the appropriate  flushing maneuvers were performed and the anastomosis was completed.  Clamps were released.  There was a palpable thrill within the fistula throughout the upper arm.  He had a palpable radial pulse.  The wound was irrigated.  Hemostasis was achieved.  The incision was closed with 2 layers of Vicryl followed by Dermabond.   Disposition: Stable to PACU   V.Theotis BurrowM.D., FAKaiser Fnd Hosp - Redwood Cityascular and Vein Specialists of [place]: [phone]:  [phone]

## 2019-01-25 NOTE — Anesthesia Procedure Notes (Signed)
Procedure Name: MAC Date/Time: 01/25/2019 1:48 PM Performed by: Kathryne Hitch, CRNA Pre-anesthesia Checklist: Patient identified, Emergency Drugs available, Suction available and Patient being monitored Patient Re-evaluated:Patient Re-evaluated prior to induction Oxygen Delivery Method: Simple face mask Preoxygenation: Pre-oxygenation with 100% oxygen Induction Type: IV induction Dental Injury: Teeth and Oropharynx as per pre-operative assessment

## 2019-01-26 ENCOUNTER — Encounter (HOSPITAL_COMMUNITY): Payer: Self-pay | Admitting: Surgery

## 2019-01-27 NOTE — Progress Notes (Signed)
HEMATOLOGY/ONCOLOGY CONSULTATION NOTE  Date of Service: 01/27/2019  Patient Care Team: Isaac Bliss, Rayford Halsted, MD as PCP - General (Internal Medicine) Isaac Bliss, Rayford Halsted, MD (Internal Medicine) Center, [facility]  CHIEF COMPLAINTS/PURPOSE OF CONSULTATION:  Refractory Anemia  HISTORY OF PRESENTING ILLNESS:   Bill [name] is a wonderful 38 y.o. male who has been referred to [place] by Harrie Jeans, FNP for evaluation and management of refractory anemia, unresponsive to Retacrit. The pt reports that he is doing well overall.  The pt reports that he has been on dialysis for about 3 weeks. He was receiving biweekly doses of erythropoietin shots, but has not had one since he started dialysis. He has had one blood transfusion on 12/28/2018. His nephrologist thinks that his CKD is caused by diabetes, which he has had since he was 38 years old. He notes that his HGB has increased since starting dialysis. The pt denies weight changes, fevers, chills, recent infections, and night sweats, though he did have night sweats when he first started dialysis.  He had a kidney biopsy in [id number] which was complicated by a small perinephric hematoma. He notes that his anemia was not an issue prior to this.  The pt had a stroke in 01/2018 and notes that his vision has been blurry occasionally since then. He did not have any symptoms associated with the stroke and it was an incidental finding.  Most recent lab results (12/28/2018) of CBC and CMP is as follows: all values are WNL except for RBC at 2.62, HGB at 6.7, HCT at 22.4, MCH at 25.6, MCHC at 29.9, RDW at 17.8, Potassium at 6.3, Chloride at 113, CO2 at 18, glucose at 147, BUN at 84, Creatinine at 6.82, Calcium at 8.8, Albumin at 3.1. He also had an I-STAT which revealed HGB at 11.2  On review of systems, pt reports no new concerns and denies weight changes, recent infections, fevers, chills, night sweats, and any other symptoms.   On PMHx  the pt reports stroke in 01/2018, diabetes, blood transfusion on 12/28/2018 On Social Hx the pt reports minimal alcohol use On Family Hx the pt reports diabetes, but no hx of blood disorders  MEDICAL HISTORY:  Past Medical History:  Diagnosis Date   Anemia    Diabetes mellitus without complication ([place])    Diabetic retinopathy ([place])    PDR OU   ESRD on dialysis ([place])    Dialyzing T-Th-S   Hypertension    PONV (postoperative nausea and vomiting)    Renal disorder    Stroke [facility])     SURGICAL HISTORY: Past Surgical History:  Procedure Laterality Date   arm surgery     AV FISTULA PLACEMENT Left 01/25/2019   Procedure: ARTERIOVENOUS (AV) FISTULA CREATION  LEFT UPPER  ARM;  Surgeon: Serafina Mitchell, MD;  Location: [facility] OR;  Service: Vascular;  Laterality: Left;   WISDOM TOOTH EXTRACTION      SOCIAL HISTORY: Social History   Socioeconomic History   Marital status: Married    Spouse name: Not on file   Number of children: Not on file   Years of education: Not on file   Highest education level: Not on file  Occupational History   Not on file  Social Needs   Financial resource strain: Not on file   Food insecurity    Worry: Not on file    Inability: Not on file   Transportation needs    Medical: Not on file    Non-medical:  Not on file  Tobacco Use   Smoking status: Never Smoker   Smokeless tobacco: Never Used  Substance and Sexual Activity   Alcohol use: Not Currently   Drug use: Not Currently   Sexual activity: Not on file  Lifestyle   Physical activity    Days per week: Not on file    Minutes per session: Not on file   Stress: Not on file  Relationships   Social connections    Talks on phone: Not on file    Gets together: Not on file    Attends religious service: Not on file    Active member of club or organization: Not on file    Attends meetings of clubs or organizations: Not on file    Relationship status: Not on file   Intimate  partner violence    Fear of current or ex partner: Not on file    Emotionally abused: Not on file    Physically abused: Not on file    Forced sexual activity: Not on file  Other Topics Concern   Not on file  Social History Narrative   ** Merged History Encounter **        FAMILY HISTORY: Family History  Problem Relation Age of Onset   Diabetes Mellitus II Mother    Hypertension Mother    Cataracts Mother    Diabetes Mellitus II Father    Stroke Father    Hypertension Father    Glaucoma Maternal Grandmother    Amblyopia Neg Hx    Blindness Neg Hx    Macular degeneration Neg Hx    Retinal detachment Neg Hx    Strabismus Neg Hx    Retinitis pigmentosa Neg Hx     ALLERGIES:  is allergic to food.  MEDICATIONS:  Current Outpatient Medications  Medication Sig Dispense Refill   aspirin EC 325 MG tablet Take 325 mg by mouth every 6 (six) hours as needed (pain).     atorvastatin (LIPITOR) 80 MG tablet Take 1 tablet (80 mg total) by mouth every evening. 30 tablet 12   glipiZIDE (GLUCOTROL) 5 MG tablet Take 0.5 tablets (2.5 mg total) by mouth 2 (two) times daily before a meal. Take for Diabetes----Hold if you Skip a Meal 30 tablet 2   oxyCODONE-acetaminophen (PERCOCET) 5-325 MG tablet Take 1 tablet by mouth every 4 (four) hours as needed for severe pain. 20 tablet 0   No current facility-administered medications for this visit.     REVIEW OF SYSTEMS:    A 10+ POINT REVIEW OF SYSTEMS WAS OBTAINED including neurology, dermatology, psychiatry, cardiac, respiratory, lymph, extremities, GI, GU, Musculoskeletal, constitutional, breasts, reproductive, HEENT.  All pertinent positives are noted in the HPI.  All others are negative.   PHYSICAL EXAMINATION: ECOG PERFORMANCE STATUS: 0 - Asymptomatic  . Vitals:   01/28/19 1039  BP: 135/84  Pulse: 95  Resp: 18  Temp: 97.8 F (36.6 C)  SpO2: 100%   Filed Weights   01/28/19 1039  Weight: 175 lb 9.6 oz (79.7 kg)    .Body mass index is 24.49 kg/m.   GENERAL:alert, in no acute distress and comfortable SKIN: no acute rashes, no significant lesions EYES: conjunctiva are pink and non-injected, sclera anicteric OROPHARYNX: MMM, no exudates, no oropharyngeal erythema or ulceration NECK: supple, no JVD LYMPH:  no palpable lymphadenopathy in the cervical, axillary or inguinal regions LUNGS: clear to auscultation b/l with normal respiratory effort HEART: regular rate & rhythm ABDOMEN:  normoactive bowel sounds , non tender,  not distended. Extremity: no pedal edema PSYCH: alert & oriented x 3 with fluent speech NEURO: no focal motor/sensory deficits  LABORATORY DATA:  I have reviewed the data as listed  . CBC Latest Ref Rng & Units 01/28/2019 01/25/2019 01/04/2019  WBC 4.0 - 10.5 K/uL 6.1 - -  Hemoglobin 13.0 - 17.0 g/dL 10.1(L) 11.2(L) 8.5(L)  Hematocrit 39.0 - 52.0 % 32.6(L) 33.0(L) -  Platelets 150 - 400 K/uL 286 - -    . CMP Latest Ref Rng & Units 01/28/2019 01/25/2019 12/28/2018  Glucose 70 - 99 mg/dL 127(H) 136(H) 147(H)  BUN 6 - 20 mg/dL 67(H) - 84(H)  Creatinine 0.61 - 1.24 mg/dL 9.62(HH) - 6.82(H)  Sodium 135 - 145 mmol/L 132(L) 133(L) 139  Potassium 3.5 - 5.1 mmol/L 5.7(H) 5.0 6.3(HH)  Chloride 98 - 111 mmol/L 91(L) - 113(H)  CO2 22 - 32 mmol/L 25 - 18(L)  Calcium 8.9 - 10.3 mg/dL 9.5 - 8.8(L)  Total Protein 6.5 - 8.1 g/dL 8.3(H) - 6.7  Total Bilirubin 0.3 - 1.2 mg/dL 0.3 - 0.3  Alkaline Phos 38 - 126 U/L 55 - 40  AST 15 - 41 U/L 14(L) - 15  ALT 0 - 44 U/L <6 - 17   Component     Latest Ref Rng & Units 01/28/2019  Iron     42 - 163 ug/dL 57  TIBC     202 - 409 ug/dL 205  Saturation Ratios     20 - 55 % 28  UIBC     117 - 376 ug/dL 148  Sed Rate     0 - 16 mm/hr 62 (H)  Haptoglobin     17 - 317 mg/dL 175  LDH     98 - 192 U/L 206 (H)  Ferritin     24 - 336 ng/mL 1,371 (H)    RADIOGRAPHIC STUDIES: I have personally reviewed the radiological images as listed and agreed  with the findings in the report. Vas [place] Upper Extremity Arterial Duplex  Result Date: 01/21/2019 UPPER EXTREMITY DUPLEX STUDY Indications: Pre-access.  Performing Technologist: Ronal Fear RVS, RCS  Examination Guidelines: A complete evaluation includes B-mode imaging, spectral Doppler, color Doppler, and power Doppler as needed of all accessible portions of each vessel. Bilateral testing is considered an integral part of a complete examination. Limited examinations for reoccurring indications may be performed as noted.  Right Pre-Dialysis Findings: +-----------------------+----------+--------------------+---------+--------+  Location                PSV (cm/s) Intralum. Diam. (cm) Waveform  Comments  +-----------------------+----------+--------------------+---------+--------+  Brachial Antecub. fossa 84         0.42                 triphasic           +-----------------------+----------+--------------------+---------+--------+  Radial Art at Wrist     76         0.26                 triphasic           +-----------------------+----------+--------------------+---------+--------+  Ulnar Art at Wrist      70         0.24                 triphasic           +-----------------------+----------+--------------------+---------+--------+ Left Pre-Dialysis Findings: +-----------------------+----------+--------------------+---------+--------+  Location                PSV (  cm/s) Intralum. Diam. (cm) Waveform  Comments  +-----------------------+----------+--------------------+---------+--------+  Brachial Antecub. fossa 119        0.37                 triphasic           +-----------------------+----------+--------------------+---------+--------+  Radial Art at Wrist     83         0.19                 triphasic           +-----------------------+----------+--------------------+---------+--------+  Ulnar Art at Wrist      101        0.21                 triphasic            +-----------------------+----------+--------------------+---------+--------+  Summary:  Right: No obstruction visualized in the right upper extremity. Left: No obstruction visualized in the left upper extremity. *See table(s) above for measurements and observations. Electronically signed by Harold Barban MD on 01/21/2019 at 11:13:13 AM.    Final    Vas [place] Upper Extremity Vein Mapping  Result Date: 01/21/2019 UPPER EXTREMITY VEIN MAPPING  Indications: Pre-access. Performing Technologist: Ronal Fear RVS, RCS  Examination Guidelines: A complete evaluation includes B-mode imaging, spectral Doppler, color Doppler, and power Doppler as needed of all accessible portions of each vessel. Bilateral testing is considered an integral part of a complete examination. Limited examinations for reoccurring indications may be performed as noted. +-----------------+-------------+----------+--------------+  Right Cephalic    Diameter (cm) Depth (cm)    Findings     +-----------------+-------------+----------+--------------+  Shoulder              0.12                                 +-----------------+-------------+----------+--------------+  Prox upper arm                             not visualized  +-----------------+-------------+----------+--------------+  Mid upper arm                              not visualized  +-----------------+-------------+----------+--------------+  Dist upper arm                             not visualized  +-----------------+-------------+----------+--------------+  Antecubital fossa     0.17                                 +-----------------+-------------+----------+--------------+  Prox forearm          0.24                                 +-----------------+-------------+----------+--------------+  Mid forearm           0.22                                 +-----------------+-------------+----------+--------------+  Dist forearm          0.29                                  +-----------------+-------------+----------+--------------+ +-----------------+-------------+----------+--------+  Right Basilic     Diameter (cm) Depth (cm) Findings  +-----------------+-------------+----------+--------+  Mid upper arm         0.32                           +-----------------+-------------+----------+--------+  Dist upper arm        0.29                           +-----------------+-------------+----------+--------+  Antecubital fossa     0.28                           +-----------------+-------------+----------+--------+  Prox forearm          0.24                           +-----------------+-------------+----------+--------+ +-----------------+-------------+----------+---------+  Left Cephalic     Diameter (cm) Depth (cm) Findings   +-----------------+-------------+----------+---------+  Shoulder              0.22                            +-----------------+-------------+----------+---------+  Prox upper arm        0.20                            +-----------------+-------------+----------+---------+  Mid upper arm         0.20                            +-----------------+-------------+----------+---------+  Dist upper arm        0.20                            +-----------------+-------------+----------+---------+  Antecubital fossa     0.31                            +-----------------+-------------+----------+---------+  Prox forearm          0.15                            +-----------------+-------------+----------+---------+  Mid forearm           0.21                            +-----------------+-------------+----------+---------+  Dist forearm          0.21                 branching  +-----------------+-------------+----------+---------+ +-----------------+-------------+----------+--------+  Left Basilic      Diameter (cm) Depth (cm) Findings  +-----------------+-------------+----------+--------+  Mid upper arm         0.27                            +-----------------+-------------+----------+--------+  Dist upper arm        0.24                           +-----------------+-------------+----------+--------+  Antecubital fossa  0.20                           +-----------------+-------------+----------+--------+  Prox forearm          0.18                           +-----------------+-------------+----------+--------+ Summary: Right: Patent and compressible cephalic and basilic veins. Left: Patent and compressible cephalic and basilic veins. *See table(s) above for measurements and observations.  Diagnosing physician: Harold Barban MD Electronically signed by Harold Barban MD on 01/21/2019 at 11:12:56 AM.    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 -Pt also has CKD and diabetes -Pt had a blood transfusion on 12/28/2018, and his 01/25/2019 HGB at 11.2 suggests that his counts are returning to normal -PLTs and WBC are normal  PLAN: -Discussed patient's most recent labs from 12/28/2018, PLT and WBC are normal, 01/25/2019 HGB at 11.2 -Discussed that his CKD is likely playing a role in his anemia because of EPO def and inflammation. -relative refractoriness to EPO was likely from Uremic BM suppression andit appears to be improving with HD and correction of uremia and optimization of electrolyte issues. -mx of secondary hyperPTH per nephrology to avoid BM fibrosis -continue to optimize HD with nephrology team. -Iron replacement appears reasonable at this time. -would recommend returning to EPO or Aranesp and optimizing dose with renal team. -Discussed that his counts indicate anemia and iron deficiency and that there are no obvious signs of a primary bone marrow problem -No indications for a BM Bx at this time. -labs today stable   Labs today  RTC with Dr Irene Limbo as needed based on labs   No orders of the defined types were placed in this encounter.  All of the patients questions were answered with apparent  satisfaction. The patient knows to call the clinic with any problems, questions or concerns.  I spent 25 minutes counseling the patient face to face. The total time spent in the appointment was 17mins and more than 50% was on counseling and direct patient cares.    Sullivan Lone MD MS AAHIVMS [facility] Hematology/Oncology Physician [facility]  (Office):       [phone] (Work cell):  [phone] (Fax):           [phone]  01/27/2019 3:28 PM  I, De Burrs, am acting as a scribe for Dr. Irene Limbo  .I have reviewed the above documentation for accuracy and completeness, and I agree with the above. Brunetta Genera MD

## 2019-01-28 ENCOUNTER — Other Ambulatory Visit: Payer: Self-pay

## 2019-01-28 ENCOUNTER — Telehealth: Payer: Self-pay | Admitting: *Deleted

## 2019-01-28 ENCOUNTER — Inpatient Hospital Stay: Payer: 59

## 2019-01-28 ENCOUNTER — Inpatient Hospital Stay: Payer: 59 | Attending: Hematology | Admitting: Hematology

## 2019-01-28 VITALS — BP 135/84 | HR 95 | Temp 97.8°F | Resp 18 | Ht 71.0 in | Wt 175.6 lb

## 2019-01-28 DIAGNOSIS — Z7984 Long term (current) use of oral hypoglycemic drugs: Secondary | ICD-10-CM | POA: Diagnosis not present

## 2019-01-28 DIAGNOSIS — E1122 Type 2 diabetes mellitus with diabetic chronic kidney disease: Secondary | ICD-10-CM | POA: Diagnosis not present

## 2019-01-28 DIAGNOSIS — Z992 Dependence on renal dialysis: Secondary | ICD-10-CM

## 2019-01-28 DIAGNOSIS — D631 Anemia in chronic kidney disease: Secondary | ICD-10-CM

## 2019-01-28 DIAGNOSIS — Z8673 Personal history of transient ischemic attack (TIA), and cerebral infarction without residual deficits: Secondary | ICD-10-CM | POA: Insufficient documentation

## 2019-01-28 DIAGNOSIS — I120 Hypertensive chronic kidney disease with stage 5 chronic kidney disease or end stage renal disease: Secondary | ICD-10-CM | POA: Insufficient documentation

## 2019-01-28 DIAGNOSIS — D509 Iron deficiency anemia, unspecified: Secondary | ICD-10-CM | POA: Insufficient documentation

## 2019-01-28 DIAGNOSIS — N189 Chronic kidney disease, unspecified: Secondary | ICD-10-CM

## 2019-01-28 DIAGNOSIS — Z79899 Other long term (current) drug therapy: Secondary | ICD-10-CM | POA: Diagnosis not present

## 2019-01-28 LAB — CMP (CANCER CENTER ONLY)
ALT: <6 U/L (ref 0–44)
AST: 14 U/L — ABNORMAL LOW (ref 15–41)
Albumin: 3.5 g/dL (ref 3.5–5.0)
Alkaline Phosphatase: 55 U/L (ref 38–126)
Anion gap: 16 — ABNORMAL HIGH (ref 5–15)
BUN: 67 mg/dL — ABNORMAL HIGH (ref 6–20)
CO2: 25 mmol/L (ref 22–32)
Calcium: 9.5 mg/dL (ref 8.9–10.3)
Chloride: 91 mmol/L — ABNORMAL LOW (ref 98–111)
Creatinine: 9.62 mg/dL (ref 0.61–1.24)
GFR, Est AFR Am: 7 mL/min — ABNORMAL LOW (ref >60)
GFR, Estimated: 6 mL/min — ABNORMAL LOW (ref >60)
Glucose, Bld: 127 mg/dL — ABNORMAL HIGH (ref 70–99)
Potassium: 5.7 mmol/L — ABNORMAL HIGH (ref 3.5–5.1)
Sodium: 132 mmol/L — ABNORMAL LOW (ref 135–145)
Total Bilirubin: 0.3 mg/dL (ref 0.3–1.2)
Total Protein: 8.3 g/dL — ABNORMAL HIGH (ref 6.5–8.1)

## 2019-01-28 LAB — CBC WITH DIFFERENTIAL/PLATELET
Abs Immature Granulocytes: 0.01 10*3/uL (ref 0.00–0.07)
Basophils Absolute: 0 10*3/uL (ref 0.0–0.1)
Basophils Relative: 1 %
Eosinophils Absolute: 0 10*3/uL (ref 0.0–0.5)
Eosinophils Relative: 1 %
HCT: 32.6 % — ABNORMAL LOW (ref 39.0–52.0)
Hemoglobin: 10.1 g/dL — ABNORMAL LOW (ref 13.0–17.0)
Immature Granulocytes: 0 %
Lymphocytes Relative: 20 %
Lymphs Abs: 1.2 10*3/uL (ref 0.7–4.0)
MCH: 25.1 pg — ABNORMAL LOW (ref 26.0–34.0)
MCHC: 31 g/dL (ref 30.0–36.0)
MCV: 81.1 fL (ref 80.0–100.0)
Monocytes Absolute: 0.6 10*3/uL (ref 0.1–1.0)
Monocytes Relative: 11 %
Neutro Abs: 4.2 10*3/uL (ref 1.7–7.7)
Neutrophils Relative %: 67 %
Platelets: 286 10*3/uL (ref 150–400)
RBC: 4.02 MIL/uL — ABNORMAL LOW (ref 4.22–5.81)
RDW: 16.3 % — ABNORMAL HIGH (ref 11.5–15.5)
WBC: 6.1 10*3/uL (ref 4.0–10.5)
nRBC: 0 % (ref 0.0–0.2)

## 2019-01-28 LAB — TYPE AND SCREEN
ABO/RH(D): A POS
Antibody Screen: NEGATIVE

## 2019-01-28 LAB — FERRITIN: Ferritin: 1371 ng/mL — ABNORMAL HIGH (ref 24–336)

## 2019-01-28 LAB — IRON AND TIBC
Iron: 57 ug/dL (ref 42–163)
Saturation Ratios: 28 % (ref 20–55)
TIBC: 205 ug/dL (ref 202–409)
UIBC: 148 ug/dL (ref 117–376)

## 2019-01-28 LAB — LACTATE DEHYDROGENASE: LDH: 206 U/L — ABNORMAL HIGH (ref 98–192)

## 2019-01-28 LAB — SEDIMENTATION RATE: Sed Rate: 62 mm/hr — ABNORMAL HIGH (ref 0–16)

## 2019-01-28 LAB — ABO/RH: ABO/RH(D): A POS

## 2019-01-28 NOTE — Patient Instructions (Signed)
Thank you for choosing [facility] to provide your oncology and hematology care.   Should you have questions after your visit to the [facility] ([facility]), please contact this office at [phone] between 8:30 AM and 4:30 PM. Voicemails left after 4:00 PM may not be returned until the following business day. Calls received after 4:30 PM will be answered by an off-site Nurse Triage Line.    Prescription Refills:  Please have your pharmacy contact us directly for most prescription requests.  Contact the office directly for refills of narcotics (pain medications). Allow 48-72 hours for refills.  Appointments: Please contact the [facility] scheduling department [phone] for questions regarding [facility] appointment scheduling.  Contact the schedulers with any scheduling changes so that your appointment can be rescheduled in a timely manner.   Central Scheduling for [place] [phone] - Call to schedule procedures such as PET scans, CT scans, MRI, Ultrasound, etc.  To afford each patient quality time with our providers, please arrive 30 minutes before your scheduled appointment time.  If you arrive late for your appointment, you may be asked to reschedule.  We strive to give you quality time with our providers, and arriving late affects you and other patients whose appointments are after yours. If you are a no show for multiple scheduled visits, you may be dismissed from the clinic at the providers discretion.     Resources: [facility] Social Workers [phone] for additional information on assistance programs --Anne Cunningham/Abigail Elmore  [place] DSS  [phone]: Information regarding food stamps, Medicaid, and utility assistance [employer] [phone]   [place] [employer]'s shared-ride transportation service for eligible riders who have a disability that prevents them from riding the fixed route bus.   Medicare Rights Center 800-333-4114 Helps people with  Medicare understand their rights and benefits, navigate the Medicare system, and secure the quality healthcare they deserve [employer] 800-227-2345 Assists patients locate various types of support and financial assistance Cancer Care: 1-800-813-HOPE (4673) Provides financial assistance, online support groups, medication/co-pay assistance.      

## 2019-01-28 NOTE — Telephone Encounter (Signed)
CRITICAL VALUE: Creatinine 9.62 DATE & TIME NOTIFIED: 01/28/2019 - 12:27 PM MESSENGER (representative from lab):Tomi Bamberger MD NOTIFIED: Dr. Irene Limbo notified 12:28 PM RESPONSE: Acknowledged

## 2019-01-29 LAB — HAPTOGLOBIN: Haptoglobin: 175 mg/dL (ref 17–317)

## 2019-01-30 ENCOUNTER — Ambulatory Visit: Payer: Self-pay | Admitting: Internal Medicine

## 2019-03-01 ENCOUNTER — Other Ambulatory Visit: Payer: Self-pay

## 2019-03-01 ENCOUNTER — Telehealth (HOSPITAL_COMMUNITY): Payer: Self-pay | Admitting: *Deleted

## 2019-03-01 DIAGNOSIS — N186 End stage renal disease: Secondary | ICD-10-CM

## 2019-03-01 DIAGNOSIS — Z992 Dependence on renal dialysis: Secondary | ICD-10-CM

## 2019-03-01 NOTE — Telephone Encounter (Signed)
The above patient or their representative was contacted and gave the following answers to these questions:         Do you have any of the following symptoms?n  Fever                    Cough                   Shortness of breath  Do  you have any of the following other symptoms? n  muscle pain         vomiting,        diarrhea        rash         weakness        red eye        abdominal pain         bruising          bruising or bleeding              joint pain           severe headache    Have you been in contact with someone who was or has been sick in the past 2 weeks?n  Yes                 Unsure                         Unable to assess   Does the person that you were in contact with have any of the following symptoms?   Cough         shortness of breath           muscle pain         vomiting,            diarrhea            rash            weakness           fever            red eye           abdominal pain           bruising  or  bleeding                joint pain                severe headache               Have you  or someone you have been in contact with traveled internationally in th last month?         If yes, which countries?   Have you  or someone you have been in contact with traveled outside [place] in th last month?         If yes, which state and city?   COMMENTS OR ACTION PLAN FOR THIS PATIENT:         q 

## 2019-03-04 ENCOUNTER — Ambulatory Visit (HOSPITAL_COMMUNITY)
Admission: RE | Admit: 2019-03-04 | Discharge: 2019-03-04 | Disposition: A | Discharge status: Home / Self Care | Payer: 59 | Source: Ambulatory Visit | Attending: Surgery | Admitting: Surgery

## 2019-03-04 ENCOUNTER — Ambulatory Visit (INDEPENDENT_AMBULATORY_CARE_PROVIDER_SITE_OTHER): Payer: Self-pay | Admitting: Physician Assistant

## 2019-03-04 ENCOUNTER — Other Ambulatory Visit: Payer: Self-pay

## 2019-03-04 ENCOUNTER — Encounter: Payer: Self-pay | Admitting: *Deleted

## 2019-03-04 ENCOUNTER — Other Ambulatory Visit: Payer: Self-pay | Admitting: *Deleted

## 2019-03-04 VITALS — BP 165/94 | HR 74 | Temp 97.9°F | Resp 14 | Ht 71.0 in | Wt 172.0 lb

## 2019-03-04 DIAGNOSIS — N186 End stage renal disease: Secondary | ICD-10-CM

## 2019-03-04 DIAGNOSIS — Z992 Dependence on renal dialysis: Secondary | ICD-10-CM

## 2019-03-04 NOTE — Progress Notes (Signed)
  POST OPERATIVE OFFICE NOTE    CC:  F/u for surgery  HPI:  This is a 38 y.o. 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 Trula Slade on 01/25/2019.  He currently dialyzes via a [place] on T/T/S at [facility] location.  He is here today for follow up.  He denies any pain or numbness in his left hand.    Allergies  Allergen Reactions  . Food     Melons-itchy/headaches    Current Outpatient Medications  Medication Sig Dispense Refill  . aspirin EC 325 MG tablet Take 325 mg by mouth every 6 (six) hours as needed (pain).    Marland Kitchen atorvastatin (LIPITOR) 80 MG tablet Take 1 tablet (80 mg total) by mouth every evening. 30 tablet 12  . glipiZIDE (GLUCOTROL) 5 MG tablet Take 0.5 tablets (2.5 mg total) by mouth 2 (two) times daily before a meal. Take for Diabetes----Hold if you Skip a Meal 30 tablet 2  . oxyCODONE-acetaminophen (PERCOCET) 5-325 MG tablet Take 1 tablet by mouth every 4 (four) hours as needed for severe pain. 20 tablet 0   No current facility-administered medications for this visit.      ROS:  See HPI  Physical Exam:  Today's Vitals   03/04/19 1238  BP: (!) 165/94  Pulse: 74  Resp: 14  Temp: 97.9 F (36.6 C)  TempSrc: Temporal  SpO2: 98%  Weight: 172 lb (78 kg)  Height: 5\' 11"  (1.803 m)   Body mass index is 23.99 kg/m.   Incision:  Healed nicely. Extremities:  Easily palpable left radial pulse; fistula is easily palpable and there is a thrill in the fistula.  Motor and sensory are in tact.   Dialysis duplex 03/04/2019: Diameter:  0.34-0.37cm Depth:  0.40-0.45  Assessment/Plan:  This is a 38 y.o. male who is s/p: 1st stage BVT by Dr. Trula Slade on 01/25/2019 here today for follow up.  -pt fistula is palpable and has a thrill, however, on duplex, this is not maturing and is only 0.37cm at its largest diameter.   -he does not have evidence of steal sx and left radial pulse is palpable and sensory and motor are in tact.  -will set pt up for fistulogram with Dr. Trula Slade on a non  dialysis day.   Leontine Locket, PA-C [facility] [phone]  Clinic MD:  Trula Slade

## 2019-03-08 ENCOUNTER — Other Ambulatory Visit (HOSPITAL_COMMUNITY)
Admission: RE | Admit: 2019-03-08 | Discharge: 2019-03-08 | Disposition: A | Discharge status: Home / Self Care | Payer: 59 | Source: Ambulatory Visit | Attending: Vascular Surgery | Admitting: Vascular Surgery

## 2019-03-08 DIAGNOSIS — Z01812 Encounter for preprocedural laboratory examination: Secondary | ICD-10-CM | POA: Diagnosis not present

## 2019-03-08 DIAGNOSIS — Z20828 Contact with and (suspected) exposure to other viral communicable diseases: Secondary | ICD-10-CM | POA: Diagnosis not present

## 2019-03-09 LAB — NOVEL CORONAVIRUS, NAA (HOSP ORDER, SEND-OUT TO REF LAB; TAT 18-24 HRS): SARS-CoV-2, NAA: NOT DETECTED

## 2019-03-11 ENCOUNTER — Encounter (HOSPITAL_COMMUNITY)
Admission: RE | Disposition: A | Discharge status: Home / Self Care | Payer: Self-pay | Source: Home / Self Care | Attending: Vascular Surgery

## 2019-03-11 ENCOUNTER — Encounter (HOSPITAL_COMMUNITY): Payer: Self-pay | Admitting: Vascular Surgery

## 2019-03-11 ENCOUNTER — Ambulatory Visit (HOSPITAL_COMMUNITY)
Admission: RE | Admit: 2019-03-11 | Discharge: 2019-03-11 | Disposition: A | Discharge status: Home / Self Care | Payer: 59 | Attending: Vascular Surgery | Admitting: Vascular Surgery

## 2019-03-11 ENCOUNTER — Other Ambulatory Visit: Payer: Self-pay

## 2019-03-11 DIAGNOSIS — N186 End stage renal disease: Secondary | ICD-10-CM | POA: Insufficient documentation

## 2019-03-11 DIAGNOSIS — E1122 Type 2 diabetes mellitus with diabetic chronic kidney disease: Secondary | ICD-10-CM | POA: Diagnosis not present

## 2019-03-11 DIAGNOSIS — I120 Hypertensive chronic kidney disease with stage 5 chronic kidney disease or end stage renal disease: Secondary | ICD-10-CM | POA: Insufficient documentation

## 2019-03-11 DIAGNOSIS — Z7982 Long term (current) use of aspirin: Secondary | ICD-10-CM | POA: Diagnosis not present

## 2019-03-11 DIAGNOSIS — Z79899 Other long term (current) drug therapy: Secondary | ICD-10-CM | POA: Insufficient documentation

## 2019-03-11 DIAGNOSIS — T82898A Other specified complication of vascular prosthetic devices, implants and grafts, initial encounter: Secondary | ICD-10-CM

## 2019-03-11 DIAGNOSIS — Z992 Dependence on renal dialysis: Secondary | ICD-10-CM

## 2019-03-11 DIAGNOSIS — Z7984 Long term (current) use of oral hypoglycemic drugs: Secondary | ICD-10-CM | POA: Insufficient documentation

## 2019-03-11 HISTORY — PX: A/V FISTULAGRAM: CATH118298

## 2019-03-11 LAB — POCT I-STAT, CHEM 8
BUN: 48 mg/dL — ABNORMAL HIGH (ref 6–20)
Calcium, Ion: 1.04 mmol/L — ABNORMAL LOW (ref 1.15–1.40)
Chloride: 103 mmol/L (ref 98–111)
Creatinine, Ser: 10.3 mg/dL — ABNORMAL HIGH (ref 0.61–1.24)
Glucose, Bld: 151 mg/dL — ABNORMAL HIGH (ref 70–99)
HCT: 37 % — ABNORMAL LOW (ref 39.0–52.0)
Hemoglobin: 12.6 g/dL — ABNORMAL LOW (ref 13.0–17.0)
Potassium: 4.7 mmol/L (ref 3.5–5.1)
Sodium: 137 mmol/L (ref 135–145)
TCO2: 25 mmol/L (ref 22–32)

## 2019-03-11 SURGERY — A/V FISTULAGRAM
Anesthesia: LOCAL

## 2019-03-11 MED ORDER — LIDOCAINE HCL (PF) 1 % IJ SOLN
INTRAMUSCULAR | Status: DC | PRN
  Administered 2019-03-11: 5 mL

## 2019-03-11 MED ORDER — IODIXANOL 320 MG/ML IV SOLN
INTRAVENOUS | Status: DC | PRN
  Administered 2019-03-11: 20 mL

## 2019-03-11 MED ORDER — HEPARIN (PORCINE) IN NACL 1000-0.9 UT/500ML-% IV SOLN
INTRAVENOUS | Status: AC
  Filled 2019-03-11: qty 500

## 2019-03-11 MED ORDER — SODIUM CHLORIDE 0.9% FLUSH: 3.0000 mL | INTRAVENOUS | Status: DC | PRN

## 2019-03-11 MED ORDER — LIDOCAINE HCL (PF) 1 % IJ SOLN
INTRAMUSCULAR | Status: AC
  Filled 2019-03-11: qty 30

## 2019-03-11 MED ORDER — HEPARIN (PORCINE) IN NACL 1000-0.9 UT/500ML-% IV SOLN
INTRAVENOUS | Status: DC | PRN
  Administered 2019-03-11: 500 mL

## 2019-03-11 SURGICAL SUPPLY — 9 items
BAG SNAP BAND KOVER 36X36 (MISCELLANEOUS) ×2 IMPLANT
COVER DOME SNAP 22 D (MISCELLANEOUS) ×2 IMPLANT
KIT MICROPUNCTURE NIT STIFF (SHEATH) ×2 IMPLANT
PROTECTION STATION PRESSURIZED (MISCELLANEOUS) ×2
SHEATH PROBE COVER 6X72 (BAG) ×2 IMPLANT
STATION PROTECTION PRESSURIZED (MISCELLANEOUS) ×1 IMPLANT
STOPCOCK MORSE 400PSI 3WAY (MISCELLANEOUS) ×2 IMPLANT
TRAY PV CATH (CUSTOM PROCEDURE TRAY) ×2 IMPLANT
TUBING CIL FLEX 10 FLL-RA (TUBING) ×2 IMPLANT

## 2019-03-11 NOTE — Op Note (Signed)
    Patient name: Bill [name] MRN: [id number] DOB: 07/11/1980 Sex: male  03/11/2019 Pre-operative Diagnosis: End-stage renal disease Post-operative diagnosis:  Same Surgeon:  Erlene Quan C. Donzetta Matters, MD Procedure Performed: 1.  Ultrasound-guided cannulation left arm AV fistula 2.  Left upper extremity shuntogram  Indications: 38 year old male underwent for stage basilic vein fistula.  By duplex ultrasound this has failed to mature.  He is now indicated for fistulogram to plan further access.  Findings: At the access site the fistula was quite diminutive.  About the mid arm does appear to be matured and measures 0.5 mm diameter at least.  There is no central obstruction.  There is a catheter in place from the right IJ.  We will schedule patient for left arm second stage basilic vein fistula creation versus graft.   Procedure:  The patient was identified in the holding area and taken to room 8.  The patient was then placed supine on the table and prepped and draped in the usual sterile fashion.  A time out was called.  Ultrasound was used to evaluate the left arm AV fistula.  The area was anesthetized 1% lidocaine.  Fistula was cannulated direct ultrasound visualization and image was saved the permanent record.  We placed a micropuncture needle followed by wire and sheath.  Fistulogram was performed as well as retrograde imaging with inflated blood pressure cuff.  With the above findings we will plan patient for further access procedure be at second stage basilic vein or graft.  Sheath was removed from the fistula suture-ligated with 4-0 Monocryl.  He tolerated this procedure well without immediate complication.  Contrast: 20cc  Bill [name] C. Donzetta Matters, MD [facility] [place] Office: [phone] Pager: [phone]

## 2019-03-11 NOTE — Discharge Instructions (Signed)

## 2019-03-11 NOTE — H&P (Signed)
   History and Physical Update  The patient was interviewed and re-examined.  The patient's previous History and Physical has been reviewed and is unchanged from recent office visit. Plan for shuntogram to plan 2nd stage bvt vs other access options.   Brandon C. Donzetta Matters, MD [facility] [place] Office: [phone] Pager: [phone]  03/11/2019, 7:12 AM

## 2019-03-12 ENCOUNTER — Encounter: Payer: Self-pay | Admitting: *Deleted

## 2019-03-12 ENCOUNTER — Telehealth: Payer: Self-pay | Admitting: *Deleted

## 2019-03-12 ENCOUNTER — Other Ambulatory Visit: Payer: Self-pay | Admitting: *Deleted

## 2019-03-12 NOTE — Telephone Encounter (Signed)
Spoke with Caren Griffins at [facility]. Fax received of pre-op instruction letter she will review with patient.

## 2019-03-12 NOTE — Progress Notes (Signed)
Call to patient to schedule surgery with Dr. Trula Slade. Instructions reviewed as per letter(see letter) that was also faxed to [place]. Patient verbalized understanding.

## 2019-03-15 ENCOUNTER — Telehealth: Payer: Self-pay | Admitting: Surgery

## 2019-03-15 NOTE — Telephone Encounter (Signed)
Per [facility] Rep, patient's insurance termed on 03/13/2019.  I called patient to ask if he has another insurance for his upcoming vascular procedure.  Left voice message.  Thurston Hole., LPN

## 2019-03-22 ENCOUNTER — Other Ambulatory Visit: Payer: Self-pay

## 2019-03-22 ENCOUNTER — Encounter (HOSPITAL_COMMUNITY): Payer: Self-pay | Admitting: *Deleted

## 2019-03-22 NOTE — Progress Notes (Signed)
Mr Chatmon denies chest pain or shortness of breath.  Mr Batz denies any symptoms of Covid 19.  Patien twill be tested for Covid on Monday, 03/25/2019.  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 Patient reports that CBG's have been running in the 170 range.  I instructed patient to not take evening dose of Glypidie on Tuesday.  I instructed patient to check CBG after awaking and every 2 hours until arrival  to the hospital.  I Instructed patient if CBG is less than 70 to take 4 Glucose Tablets.  Recheck CBG in 15 minutes then call pre- op desk at [phone] for further instructions.

## 2019-03-25 ENCOUNTER — Other Ambulatory Visit (HOSPITAL_COMMUNITY)
Admission: RE | Admit: 2019-03-25 | Discharge: 2019-03-25 | Disposition: A | Discharge status: Home / Self Care | Payer: Self-pay | Source: Ambulatory Visit | Attending: Surgery | Admitting: Surgery

## 2019-03-25 DIAGNOSIS — Z20828 Contact with and (suspected) exposure to other viral communicable diseases: Secondary | ICD-10-CM | POA: Insufficient documentation

## 2019-03-25 DIAGNOSIS — Z01812 Encounter for preprocedural laboratory examination: Secondary | ICD-10-CM | POA: Insufficient documentation

## 2019-03-25 LAB — SARS CORONAVIRUS 2 (TAT 6-24 HRS): SARS Coronavirus 2: NEGATIVE

## 2019-03-27 ENCOUNTER — Ambulatory Visit (HOSPITAL_COMMUNITY): Payer: 59 | Admitting: Certified Registered Nurse Anesthetist

## 2019-03-27 ENCOUNTER — Ambulatory Visit (HOSPITAL_COMMUNITY)
Admission: RE | Admit: 2019-03-27 | Discharge: 2019-03-27 | Disposition: A | Discharge status: Home / Self Care | Payer: 59 | Attending: Surgery | Admitting: Surgery

## 2019-03-27 ENCOUNTER — Other Ambulatory Visit: Payer: Self-pay

## 2019-03-27 ENCOUNTER — Encounter (HOSPITAL_COMMUNITY): Payer: Self-pay

## 2019-03-27 ENCOUNTER — Encounter (HOSPITAL_COMMUNITY)
Admission: RE | Disposition: A | Discharge status: Home / Self Care | Payer: Self-pay | Source: Home / Self Care | Attending: Surgery

## 2019-03-27 DIAGNOSIS — Z8673 Personal history of transient ischemic attack (TIA), and cerebral infarction without residual deficits: Secondary | ICD-10-CM | POA: Insufficient documentation

## 2019-03-27 DIAGNOSIS — E785 Hyperlipidemia, unspecified: Secondary | ICD-10-CM | POA: Insufficient documentation

## 2019-03-27 DIAGNOSIS — E13319 Other specified diabetes mellitus with unspecified diabetic retinopathy without macular edema: Secondary | ICD-10-CM | POA: Diagnosis not present

## 2019-03-27 DIAGNOSIS — Z992 Dependence on renal dialysis: Secondary | ICD-10-CM | POA: Insufficient documentation

## 2019-03-27 DIAGNOSIS — E1322 Other specified diabetes mellitus with diabetic chronic kidney disease: Secondary | ICD-10-CM | POA: Diagnosis not present

## 2019-03-27 DIAGNOSIS — Y832 Surgical operation with anastomosis, bypass or graft as the cause of abnormal reaction of the patient, or of later complication, without mention of misadventure at the time of the procedure: Secondary | ICD-10-CM | POA: Diagnosis not present

## 2019-03-27 DIAGNOSIS — N185 Chronic kidney disease, stage 5: Secondary | ICD-10-CM

## 2019-03-27 DIAGNOSIS — T82510A Breakdown (mechanical) of surgically created arteriovenous fistula, initial encounter: Secondary | ICD-10-CM | POA: Diagnosis not present

## 2019-03-27 DIAGNOSIS — E1351 Other specified diabetes mellitus with diabetic peripheral angiopathy without gangrene: Secondary | ICD-10-CM | POA: Diagnosis not present

## 2019-03-27 DIAGNOSIS — I132 Hypertensive heart and chronic kidney disease with heart failure and with stage 5 chronic kidney disease, or end stage renal disease: Secondary | ICD-10-CM | POA: Insufficient documentation

## 2019-03-27 DIAGNOSIS — I5030 Unspecified diastolic (congestive) heart failure: Secondary | ICD-10-CM | POA: Diagnosis not present

## 2019-03-27 DIAGNOSIS — N186 End stage renal disease: Secondary | ICD-10-CM | POA: Diagnosis present

## 2019-03-27 HISTORY — DX: Personal history of other medical treatment: Z92.89

## 2019-03-27 HISTORY — PX: BASCILIC VEIN TRANSPOSITION: SHX5742

## 2019-03-27 HISTORY — DX: Anxiety disorder, unspecified: F41.9

## 2019-03-27 LAB — POCT I-STAT, CHEM 8
BUN: 58 mg/dL — ABNORMAL HIGH (ref 6–20)
Calcium, Ion: 1.12 mmol/L — ABNORMAL LOW (ref 1.15–1.40)
Chloride: 105 mmol/L (ref 98–111)
Creatinine, Ser: 9.7 mg/dL — ABNORMAL HIGH (ref 0.61–1.24)
Glucose, Bld: 192 mg/dL — ABNORMAL HIGH (ref 70–99)
HCT: 34 % — ABNORMAL LOW (ref 39.0–52.0)
Hemoglobin: 11.6 g/dL — ABNORMAL LOW (ref 13.0–17.0)
Potassium: 4.6 mmol/L (ref 3.5–5.1)
Sodium: 139 mmol/L (ref 135–145)
TCO2: 24 mmol/L (ref 22–32)

## 2019-03-27 LAB — GLUCOSE, CAPILLARY
Glucose-Capillary: 169 mg/dL — ABNORMAL HIGH (ref 70–99)
Glucose-Capillary: 128 mg/dL — ABNORMAL HIGH (ref 70–99)

## 2019-03-27 SURGERY — TRANSPOSITION, VEIN, BASILIC
Anesthesia: Monitor Anesthesia Care | Site: Arm Upper | Laterality: Left

## 2019-03-27 MED ORDER — ONDANSETRON HCL 4 MG/2ML IJ SOLN: 4.0000 mg | Freq: Once | INTRAMUSCULAR | Status: DC | PRN

## 2019-03-27 MED ORDER — CHLORHEXIDINE GLUCONATE 4 % EX LIQD: 60.0000 mL | Freq: Once | CUTANEOUS | Status: DC

## 2019-03-27 MED ORDER — OXYCODONE HCL 5 MG/5ML PO SOLN: 5.0000 mg | Freq: Once | ORAL | Status: AC | PRN

## 2019-03-27 MED ORDER — MIDAZOLAM HCL 2 MG/2ML IJ SOLN: INTRAMUSCULAR | Status: DC | PRN

## 2019-03-27 MED ORDER — FENTANYL CITRATE (PF) 100 MCG/2ML IJ SOLN: 25.0000 ug | INTRAMUSCULAR | Status: DC | PRN

## 2019-03-27 MED ORDER — HEMOSTATIC AGENTS (NO CHARGE) OPTIME
TOPICAL | Status: DC | PRN
  Administered 2019-03-27: 1 via TOPICAL

## 2019-03-27 MED ORDER — SODIUM CHLORIDE 0.9 % IV SOLN
INTRAVENOUS | Status: DC | PRN
  Administered 2019-03-27: 500 mL

## 2019-03-27 MED ORDER — OXYCODONE HCL 5 MG PO TABS
5.0000 mg | ORAL_TABLET | Freq: Once | ORAL | Status: AC | PRN
  Administered 2019-03-27: 13:00:00 5 mg via ORAL

## 2019-03-27 MED ORDER — 0.9 % SODIUM CHLORIDE (POUR BTL) OPTIME
TOPICAL | Status: DC | PRN
  Administered 2019-03-27: 1000 mL

## 2019-03-27 MED ORDER — PROPOFOL 500 MG/50ML IV EMUL
INTRAVENOUS | Status: DC | PRN
  Administered 2019-03-27: 75 ug/kg/min via INTRAVENOUS

## 2019-03-27 MED ORDER — SODIUM CHLORIDE 0.9 % IV SOLN
INTRAVENOUS | Status: AC
  Filled 2019-03-27: qty 1.2

## 2019-03-27 MED ORDER — MIDAZOLAM HCL 2 MG/2ML IJ SOLN
INTRAMUSCULAR | Status: AC
  Filled 2019-03-27: qty 2

## 2019-03-27 MED ORDER — SODIUM CHLORIDE 0.9 % IV SOLN
INTRAVENOUS | Status: DC
  Administered 2019-03-27: 09:00:00 via INTRAVENOUS

## 2019-03-27 MED ORDER — SODIUM CHLORIDE 0.9 % IV SOLN
INTRAVENOUS | Status: DC | PRN
  Administered 2019-03-27: 25 ug/min via INTRAVENOUS

## 2019-03-27 MED ORDER — LIDOCAINE-EPINEPHRINE (PF) 1 %-1:200000 IJ SOLN
INTRAMUSCULAR | Status: AC
  Filled 2019-03-27: qty 30

## 2019-03-27 MED ORDER — LIDOCAINE-EPINEPHRINE (PF) 1 %-1:200000 IJ SOLN
INTRAMUSCULAR | Status: DC | PRN
  Administered 2019-03-27: 18 mL

## 2019-03-27 MED ORDER — FENTANYL CITRATE (PF) 250 MCG/5ML IJ SOLN
INTRAMUSCULAR | Status: AC
  Filled 2019-03-27: qty 5

## 2019-03-27 MED ORDER — MIDAZOLAM HCL 2 MG/2ML IJ SOLN
2.0000 mg | Freq: Once | INTRAMUSCULAR | Status: AC
  Administered 2019-03-27: 2 mg via INTRAVENOUS

## 2019-03-27 MED ORDER — CEFAZOLIN SODIUM-DEXTROSE 2-4 GM/100ML-% IV SOLN
2.0000 g | INTRAVENOUS | Status: AC
  Administered 2019-03-27: 2 g via INTRAVENOUS

## 2019-03-27 MED ORDER — MIDAZOLAM HCL 5 MG/5ML IJ SOLN
INTRAMUSCULAR | Status: DC | PRN
  Administered 2019-03-27 (×2): 1 mg via INTRAVENOUS

## 2019-03-27 MED ORDER — PROPOFOL 10 MG/ML IV BOLUS
INTRAVENOUS | Status: AC
  Filled 2019-03-27: qty 20

## 2019-03-27 MED ORDER — FENTANYL CITRATE (PF) 250 MCG/5ML IJ SOLN
INTRAMUSCULAR | Status: DC | PRN
  Administered 2019-03-27 (×2): 25 ug via INTRAVENOUS
  Administered 2019-03-27: 50 ug via INTRAVENOUS
  Administered 2019-03-27 (×2): 25 ug via INTRAVENOUS

## 2019-03-27 MED ORDER — HYDROCODONE-ACETAMINOPHEN 5-325 MG PO TABS: 1.0000 | ORAL_TABLET | Freq: Four times a day (QID) | ORAL | 0 refills | Status: DC | PRN

## 2019-03-27 MED ORDER — PROPOFOL 10 MG/ML IV BOLUS
INTRAVENOUS | Status: AC
  Filled 2019-03-27: qty 40

## 2019-03-27 MED ORDER — OXYCODONE HCL 5 MG PO TABS
ORAL_TABLET | ORAL | Status: AC
  Filled 2019-03-27: qty 1

## 2019-03-27 SURGICAL SUPPLY — 39 items
ADH SKN CLS APL DERMABOND .7 (GAUZE/BANDAGES/DRESSINGS) ×1
ARMBAND PINK RESTRICT EXTREMIT (MISCELLANEOUS) ×2 IMPLANT
CANISTER SUCT 3000ML PPV (MISCELLANEOUS) ×2 IMPLANT
CLIP VESOCCLUDE MED 24/CT (CLIP) ×2 IMPLANT
CLIP VESOCCLUDE MED 6/CT (CLIP) IMPLANT
CLIP VESOCCLUDE SM WIDE 24/CT (CLIP) ×2 IMPLANT
CLIP VESOCCLUDE SM WIDE 6/CT (CLIP) IMPLANT
COVER PROBE W GEL 5X96 (DRAPES) ×2 IMPLANT
COVER WAND RF STERILE (DRAPES) IMPLANT
DERMABOND ADVANCED (GAUZE/BANDAGES/DRESSINGS) ×1
DERMABOND ADVANCED .7 DNX12 (GAUZE/BANDAGES/DRESSINGS) ×1 IMPLANT
ELECT REM PT RETURN 9FT ADLT (ELECTROSURGICAL) ×2
ELECTRODE REM PT RTRN 9FT ADLT (ELECTROSURGICAL) ×1 IMPLANT
GLOVE BIO SURGEON STRL SZ7 (GLOVE) ×2 IMPLANT
GLOVE BIOGEL PI IND STRL 7.0 (GLOVE) ×1 IMPLANT
GLOVE BIOGEL PI IND STRL 7.5 (GLOVE) ×1 IMPLANT
GLOVE BIOGEL PI INDICATOR 7.0 (GLOVE) ×1
GLOVE BIOGEL PI INDICATOR 7.5 (GLOVE) ×1
GLOVE SURG SS PI 7.5 STRL IVOR (GLOVE) ×4 IMPLANT
GOWN STRL REUS W/ TWL LRG LVL3 (GOWN DISPOSABLE) ×3 IMPLANT
GOWN STRL REUS W/ TWL XL LVL3 (GOWN DISPOSABLE) ×2 IMPLANT
GOWN STRL REUS W/TWL LRG LVL3 (GOWN DISPOSABLE) ×3
GOWN STRL REUS W/TWL XL LVL3 (GOWN DISPOSABLE) ×2
HEMOSTAT SNOW SURGICEL 2X4 (HEMOSTASIS) ×2 IMPLANT
KIT BASIN OR (CUSTOM PROCEDURE TRAY) ×2 IMPLANT
KIT TURNOVER KIT B (KITS) ×2 IMPLANT
NS IRRIG 1000ML POUR BTL (IV SOLUTION) ×2 IMPLANT
PACK CV ACCESS (CUSTOM PROCEDURE TRAY) ×2 IMPLANT
PAD ARMBOARD 7.5X6 YLW CONV (MISCELLANEOUS) ×4 IMPLANT
SUT PROLENE 6 0 CC (SUTURE) ×2 IMPLANT
SUT SILK 2 0 SH (SUTURE) ×2 IMPLANT
SUT SILK 3 0 (SUTURE) ×1
SUT SILK 3-0 18XBRD TIE 12 (SUTURE) ×1 IMPLANT
SUT VIC AB 3-0 SH 27 (SUTURE) ×2
SUT VIC AB 3-0 SH 27X BRD (SUTURE) ×2 IMPLANT
SUT VICRYL 4-0 PS2 18IN ABS (SUTURE) ×4 IMPLANT
TOWEL GREEN STERILE (TOWEL DISPOSABLE) ×2 IMPLANT
UNDERPAD 30X30 (UNDERPADS AND DIAPERS) ×2 IMPLANT
WATER STERILE IRR 1000ML POUR (IV SOLUTION) ×2 IMPLANT

## 2019-03-27 NOTE — Anesthesia Preprocedure Evaluation (Signed)
Anesthesia Evaluation  Patient identified by MRN, date of birth, ID band Patient awake    Reviewed: Allergy & Precautions, NPO status , Patient's Chart, lab work & pertinent test results  History of Anesthesia Complications (+) PONV and history of anesthetic complications  Airway Mallampati: II  TM Distance: >3 FB Neck ROM: Full    Dental  (+) Dental Advisory Given, Teeth Intact   Pulmonary neg pulmonary ROS,    Pulmonary exam normal        Cardiovascular hypertension, + Peripheral Vascular Disease  Normal cardiovascular exam     Neuro/Psych  Headaches, CVA (2019) negative psych ROS   GI/Hepatic negative GI ROS, Neg liver ROS,   Endo/Other  diabetes  Renal/GU ESRF and DialysisRenal disease     Musculoskeletal negative musculoskeletal ROS (+)   Abdominal   Peds  Hematology  (+) Blood dyscrasia, anemia ,   Anesthesia Other Findings Pulmonary HTN (PASP 54 on echo 09/2018)  Reproductive/Obstetrics negative OB ROS                            Anesthesia Physical  Anesthesia Plan  ASA: IV  Anesthesia Plan: MAC   Post-op Pain Management:    Induction: Intravenous  PONV Risk Score and Plan: 3 and Midazolam, Treatment may vary due to age or medical condition, Propofol infusion and TIVA  Airway Management Planned: Nasal Cannula, Simple Face Mask and Natural Airway  Additional Equipment: None  Intra-op Plan:   Post-operative Plan:   Informed Consent: I have reviewed the patients History and Physical, chart, labs and discussed the procedure including the risks, benefits and alternatives for the proposed anesthesia with the patient or authorized representative who has indicated his/her understanding and acceptance.     Dental advisory given  Plan Discussed with:   Anesthesia Plan Comments: ( TTE 10/08/18  1. The left ventricle has normal systolic function, with an ejection fraction  of 55-60%. The cavity size was normal. There is severely increased left ventricular wall thickness. Left ventricular diastolic Doppler parameters are consistent with  pseudonormalization. No evidence of left ventricular regional wall motion abnormalities. E/medial e' > 15 suggests LV end diastolic pressure at least 20 mm Hg.  2. The right ventricle has normal systolic function. The cavity was normal. There is no increase in right ventricular wall thickness.  3. Left atrial size was mild-moderately dilated.  4. Right atrial size was mildly dilated.  5. There is a small to moderate pericardial effusion. There is RA indentation but no RV diastolic collapse or respirophasic variation of the mitral inflow E velocity. The IVC is not significantly dilated. There is no evidence for tamponade.  6. Mitral valve regurgitation is moderate by color flow Doppler. No evidence of mitral valve stenosis.  7. The aortic valve is tricuspid. Aortic valve regurgitation is trivial by color flow Doppler. No stenosis of the aortic valve.  8. The aortic root and ascending aorta are normal in size and structure.  9. The IVC is normal in size. PA systolic pressure 54 mmHg.)      Anesthesia Quick Evaluation

## 2019-03-27 NOTE — Transfer of Care (Signed)
Immediate Anesthesia Transfer of Care Note  Patient: Manjot Hinks  Procedure(s) Performed: SECOND STAGE BASILIC VEIN TRANSPOSITION LEFT ARM (Left Arm Upper)  Patient Location: PACU  Anesthesia Type:MAC Level of Consciousness: awake and alert   Airway & Oxygen Therapy: Patient Spontanous Breathing and Patient connected to nasal cannula oxygen  Post-op Assessment: Report given to RN and Post -op Vital signs reviewed and stable  Post vital signs: Reviewed and stable  Last Vitals:  Vitals Value Taken Time  BP 121/76 03/27/19 1258  Temp    Pulse 80 03/27/19 1300  Resp 15 03/27/19 1300  SpO2 100 % 03/27/19 1300  Vitals shown include unvalidated device data.  Last Pain:  Vitals:   03/27/19 0913  PainSc: 0-No pain         Complications: No apparent anesthesia complications

## 2019-03-27 NOTE — Anesthesia Procedure Notes (Signed)
Procedure Name: MAC Date/Time: 03/27/2019 11:06 AM Performed by: Wilburn Cornelia, CRNA Pre-anesthesia Checklist: Patient identified, Emergency Drugs available, Suction available, Patient being monitored and Timeout performed Patient Re-evaluated:Patient Re-evaluated prior to induction Oxygen Delivery Method: Simple face mask Placement Confirmation: positive ETCO2 and breath sounds checked- equal and bilateral Dental Injury: Teeth and Oropharynx as per pre-operative assessment

## 2019-03-27 NOTE — H&P (Signed)
   Patient name: Bill [name] MRN: [id number] DOB: Dec 16, 1980 Sex: male    HISTORY OF PRESENT ILLNESS:   Bill [name] is a 38 y.o. male who is here for 2nd stage BVT  CURRENT MEDICATIONS:    Current Facility-Administered Medications  Medication Dose Route Frequency Provider Last Rate Last Dose  . 0.9 %  sodium chloride infusion   Intravenous Continuous Serafina Mitchell, MD 10 mL/hr at 03/27/19 [phone]    . ceFAZolin (ANCEF) IVPB 2g/100 mL premix  2 g Intravenous 30 min Pre-Op Serafina Mitchell, MD      . chlorhexidine (HIBICLENS) 4 % liquid 4 application  60 mL Topical Once Serafina Mitchell, MD       And  . Derrill Memo ON 03/28/2019] chlorhexidine (HIBICLENS) 4 % liquid 4 application  60 mL Topical Once Serafina Mitchell, MD      . midazolam (VERSED) 2 MG/2ML injection             REVIEW OF SYSTEMS:   [X]  denotes positive finding, [ ]  denotes negative finding Cardiac  Comments:  Chest pain or chest pressure:    Shortness of breath upon exertion:    Short of breath when lying flat:    Irregular heart rhythm:    Constitutional    Fever or chills:      PHYSICAL EXAM:   Vitals:   03/27/19 0901 03/27/19 0913  BP: (!) 185/84   Pulse: 92   Resp: 17   Temp: 97.9 F (36.6 C)   SpO2: 100%   Weight:  79.8 kg  Height:  5\' 11"  (1.803 m)    GENERAL: The patient is a well-nourished male, in no acute distress. The vital signs are documented above. CARDIOVASCULAR: There is a regular rate and rhythm. PULMONARY: Non-labored respirations   STUDIES:     MEDICAL ISSUES:   Reviewed fistulogram.  Will plan of BVT but may require a graft  [name], MD, FACS [facility] [phone] Pager [phone]

## 2019-03-27 NOTE — Op Note (Signed)
    Patient name: Jamespaul Secrist MRN: [id number] DOB: 03-Sep-1980 Sex: male  03/27/2019 Pre-operative Diagnosis: ESRD Post-operative diagnosis:  Same Surgeon:  Annamarie Major Assistants: Laurence Slate Procedure:   Left second stage basilic vein fistula creation Anesthesia: MAC Blood Loss: Minimal Specimens: None  Findings: The proximal 4-5 cm of the vein were small.  I therefore elected to exclude this portion of the basilic vein and create a new arterial venous anastomosis, only using be vein that was appropriately dilated.  Indications: The patient has previously undergone for stage basilic vein fistula creation.  He is also undergone a fistulogram which shows that the first portion of the fistula has a vein that did not fully mature.  He comes in today for second stage procedure versus graft placement.  Procedure:  The patient was identified in the holding area and taken to [place] 10  The patient was then placed supine on the table. MAC anesthesia was administered.  The patient was prepped and draped in the usual sterile fashion.  A time out was called and antibiotics were administered.  Ultrasound was used to evaluate the fistula in the upper arm.  It appeared to be widely patent and easily compressible.  1% lidocaine was used for local anesthesia.  2 longitudinal incisions were made in the upper arm.  Through these incisions I exposed the fistula from the antecubital crease up to the axilla.  All side branches were ligated between silk ties.  After approximately 5 cm from the arterial venous anastomosis, the vein was significantly more dilated.  I felt the best option was to exclude the first 5 cm of the vein and create a new arterial venous anastomosis.  I therefore dissected out the brachial artery slightly more proximal to the antecubital crease.  This is a healthy artery.  I then marked the vein for orientation and then ligated it near the arterial venous anastomosis.  The vein was  brought through the previously created tunnel.  I resected approximately 5 cm of the vein and created a new anastomosis to the brachial artery in a end-to-side fashion.  Once this was completed, there was an excellent thrill within the fistula and a palpable radial pulse.  The wound was then irrigated.  Hemostasis was achieved.  The incisions were closed with 2 layers of Vicryl followed by Dermabond.  There were no immediate complications.   Disposition: To PACU stable.   Theotis Burrow, M.D., [facility] [place] Office: [phone] Pager:  [phone]

## 2019-03-27 NOTE — Discharge Instructions (Signed)
° °  [facility]  ° °Discharge Instructions ° °AV Fistula or Graft Surgery for Dialysis Access ° °Please refer to the following instructions for your post-procedure care. Your surgeon or physician assistant will discuss any changes with you. ° °Activity ° °You may drive the day following your surgery, if you are comfortable and no longer taking prescription pain medication. Resume full activity as the soreness in your incision resolves. ° °Bathing/Showering ° °You may shower after you go home. Keep your incision dry for 48 hours. Do not soak in a bathtub, hot tub, or swim until the incision heals completely. You may not shower if you have a hemodialysis catheter. ° °Incision Care ° °Clean your incision with mild soap and water after 48 hours. Pat the area dry with a clean towel. You do not need a bandage unless otherwise instructed. Do not apply any ointments or creams to your incision. You may have skin glue on your incision. Do not peel it off. It will come off on its own in about one week. Your arm may swell a bit after surgery. To reduce swelling use pillows to elevate your arm so it is above your heart. Your doctor will tell you if you need to lightly wrap your arm with an ACE bandage. ° °Diet ° °Resume your normal diet. There are not special food restrictions following this procedure. In order to heal from your surgery, it is CRITICAL to get adequate nutrition. Your body requires vitamins, minerals, and protein. Vegetables are the best source of vitamins and minerals. Vegetables also provide the perfect balance of protein. Processed food has little nutritional value, so try to avoid this. ° °Medications ° °Resume taking all of your medications. If your incision is causing pain, you may take over-the counter pain relievers such as acetaminophen (Tylenol). If you were prescribed a stronger pain medication, please be aware these medications can cause nausea and constipation. Prevent  nausea by taking the medication with a snack or meal. Avoid constipation by drinking plenty of fluids and eating foods with high amount of fiber, such as fruits, vegetables, and grains. Do not take Tylenol if you are taking prescription pain medications. ° ° ° ° °Follow up °Your surgeon may want to see you in the office following your access surgery. If so, this will be arranged at the time of your surgery. ° °Please call us immediately for any of the following conditions: ° °Increased pain, redness, drainage (pus) from your incision site °Fever of 101 degrees or higher °Severe or worsening pain at your incision site °Hand pain or numbness. ° °Reduce your risk of vascular disease: ° °Stop smoking. If you would like help, call [facility] at 1-800-QUIT-NOW (1-800-784-8669) or [place] at 336-586-4000 ° °Manage your cholesterol °Maintain a desired weight °Control your diabetes °Keep your blood pressure down ° °Dialysis ° °It will take several weeks to several months for your new dialysis access to be ready for use. Your surgeon will determine when it is OK to use it. Your nephrologist will continue to direct your dialysis. You can continue to use your Permcath until your new access is ready for use. ° °If you have any questions, please call the office at [phone]. ° °

## 2019-03-28 ENCOUNTER — Encounter (HOSPITAL_COMMUNITY): Payer: Self-pay | Admitting: Surgery

## 2019-03-28 NOTE — Anesthesia Postprocedure Evaluation (Signed)
Anesthesia Post Note  Patient: Bill [name]  Procedure(s) Performed: SECOND STAGE BASILIC VEIN TRANSPOSITION LEFT ARM (Left Arm Upper)     Patient location during evaluation: PACU Anesthesia Type: MAC Level of consciousness: awake and alert Pain management: pain level controlled Vital Signs Assessment: post-procedure vital signs reviewed and stable Respiratory status: spontaneous breathing, nonlabored ventilation, respiratory function stable and patient connected to nasal cannula oxygen Cardiovascular status: stable and blood pressure returned to baseline Postop Assessment: no apparent nausea or vomiting Anesthetic complications: no    Last Vitals:  Vitals:   03/27/19 1315 03/27/19 1330  BP: 124/83 (!) 142/80  Pulse: 81 82  Resp: 13 11  Temp:  36.4 C  SpO2: 100% 100%    Last Pain:  Vitals:   03/27/19 1330  PainSc: 3                  Effie Berkshire

## 2019-03-29 ENCOUNTER — Other Ambulatory Visit: Payer: Self-pay

## 2019-03-29 ENCOUNTER — Ambulatory Visit (HOSPITAL_COMMUNITY)
Admission: RE | Admit: 2019-03-29 | Discharge: 2019-03-29 | Disposition: A | Discharge status: Home / Self Care | Payer: 59 | Source: Ambulatory Visit | Attending: Family | Admitting: Family

## 2019-03-29 ENCOUNTER — Other Ambulatory Visit: Payer: Self-pay | Admitting: *Deleted

## 2019-03-29 ENCOUNTER — Ambulatory Visit (INDEPENDENT_AMBULATORY_CARE_PROVIDER_SITE_OTHER): Payer: Self-pay | Admitting: Vascular Surgery

## 2019-03-29 ENCOUNTER — Encounter: Payer: Self-pay | Admitting: Vascular Surgery

## 2019-03-29 VITALS — BP 114/59 | HR 100 | Temp 98.0°F | Resp 14 | Ht 71.0 in | Wt 176.0 lb

## 2019-03-29 DIAGNOSIS — N186 End stage renal disease: Secondary | ICD-10-CM

## 2019-03-29 DIAGNOSIS — Z992 Dependence on renal dialysis: Secondary | ICD-10-CM

## 2019-03-29 NOTE — Progress Notes (Signed)
    Subjective:     Patient ID: Bill [name], male   DOB: [birth year], 38 y.o.   MRN: [id number]  HPI 38 year old male underwent left neck stage basilic vein fistula creation 2 days ago.  Since that time he has had some numbness and tingling of his left hand.  Numbness now is related to his left fourth and fifth fingers only on the dorsum of the hand.  At the time of dialysis patient states he was hypotensive and his entire hand was numb and having pins-and-needles feeling.  At the time of procedure his proximal most aspect of the fistula was excluded and a new arterial anastomosis was created.  He does have complaint of hematoma in the left upper extremity.  Review of Systems Left hand numbness as above    Objective   Physical Exam Vitals:   03/29/19 1346  BP: (!) 114/59  Pulse: 100  Resp: 14  Temp: 98 F (36.7 C)  SpO2: 98%   Awake alert oriented Nonlabored respirations Left upper arm incisions healing with surrounding ecchymosis Palpable left radial pulse Very strong thrill in left upper arm on medial side  I have independently interpreted his dialysis steal study.  Pressure in the left radial artery increased from 78 to 124 mmHg with fistula compression.  This is negative for steal.    Assessment/plan:     38 year old male status post left second stage basilic vein fistula creation.  At the time of surgery a new arteriovenous anastomosis was created and there was a much stronger thrill to my recollection in the fistula at this time.  Numbness is really confined to his fourth and fifth finger is possibly associated with hematoma in the upper arm and the numbness at the time of dialysis likely associated with hypotension.  His steal study is really unremarkable he has a palpable radial pulse on exam without compression of the fistula.  I discussed with him continued nonoperative management.  Certainly if his hand is to get worse or he has motor deficits he would need ligation of  the fistula otherwise he can keep his regularly scheduled follow-up on November 4.       Abisola Carrero C. Donzetta Matters, MD [facility] [place] Office: [phone] Pager: [phone]

## 2019-04-03 ENCOUNTER — Other Ambulatory Visit: Payer: Self-pay

## 2019-04-03 ENCOUNTER — Encounter (INDEPENDENT_AMBULATORY_CARE_PROVIDER_SITE_OTHER): Payer: Self-pay | Admitting: Ophthalmology

## 2019-04-03 ENCOUNTER — Ambulatory Visit (INDEPENDENT_AMBULATORY_CARE_PROVIDER_SITE_OTHER): Payer: Self-pay | Admitting: Ophthalmology

## 2019-04-03 DIAGNOSIS — E113513 Type 2 diabetes mellitus with proliferative diabetic retinopathy with macular edema, bilateral: Secondary | ICD-10-CM

## 2019-04-03 DIAGNOSIS — I639 Cerebral infarction, unspecified: Secondary | ICD-10-CM

## 2019-04-03 DIAGNOSIS — H35033 Hypertensive retinopathy, bilateral: Secondary | ICD-10-CM

## 2019-04-03 DIAGNOSIS — Z91199 Patient's noncompliance with other medical treatment and regimen due to unspecified reason: Secondary | ICD-10-CM

## 2019-04-03 DIAGNOSIS — I10 Essential (primary) hypertension: Secondary | ICD-10-CM

## 2019-04-03 DIAGNOSIS — H3581 Retinal edema: Secondary | ICD-10-CM

## 2019-04-03 DIAGNOSIS — Z9119 Patient's noncompliance with other medical treatment and regimen: Secondary | ICD-10-CM

## 2019-04-03 MED ORDER — BEVACIZUMAB CHEMO INJECTION 1.25MG/0.05ML SYRINGE FOR KALEIDOSCOPE
1.2500 mg | INTRAVITREAL | Status: AC | PRN
  Administered 2019-04-03: 1.25 mg via INTRAVITREAL

## 2019-04-03 NOTE — Progress Notes (Signed)
[facility] Note  04/03/2019     CHIEF COMPLAINT Patient presents for Retina Evaluation   HISTORY OF PRESENT ILLNESS: Bill [name] is a 38 y.o. male who presents to the clinic today for:   HPI    Retina Evaluation    In right eye.  This started 5 days ago.  Duration of 5 days.  Associated Symptoms Floaters.  Negative for Flashes, Blind Spot, Photophobia, Scalp Tenderness, Fever, Pain, Glare, Jaw Claudication, Weight Loss, Distortion, Redness, Trauma, Shoulder/Hip pain and Fatigue.  Context:  distance vision, mid-range vision and near vision.  Treatments tried include no treatments.  I, the attending physician,  performed the HPI with the patient and updated documentation appropriately.          Comments    Patient states sudden onset of blurry vision OD. Sees things floating in vision OD. Denies flashes. BS around 170 throughout the day. Last a1c unknown. Patient is on dialysis Tuesday, Thursday, and Saturday. Patient's blood pressure has been low (97/55 this am).       Last edited by [name], MD on 04/03/2019  3:03 PM. (History)    pt states he has gone into kidney failure and been put on dialysis since his last visit here, he states Weds he had a second procedure to have his fistula inserted and on Thursday he had dialysis and everything was okay, he states Friday afternoon he noticed a decrease in vision, pt states his blood sugar has been around 170 since being on dialysis and his blood pressure has been low, he states prior to Friday's episode of decreased vision his vision had been okay  Referring physician: Isaac Bliss, Rayford Halsted, MD [address],  [place] [address]  HISTORICAL INFORMATION:   Selected notes from the [facility] Referred by Dr. [name] for concern of severe NPDR / PDR OU; LEE-  Ocular Hx-  PMH-     CURRENT MEDICATIONS: No current outpatient medications on file. (Ophthalmic Drugs)    No current facility-administered medications for this visit.  (Ophthalmic Drugs)   Current Outpatient Medications (Other)  Medication Sig  . aspirin (BAYER ASPIRIN EC LOW DOSE) 81 MG EC tablet Take 81 mg by mouth as needed.  Marland Kitchen atorvastatin (LIPITOR) 80 MG tablet Take 1 tablet (80 mg total) by mouth every evening.  [name] 1 GM 210 MG(Fe) tablet Take 210 mg by mouth 3 (three) times daily with meals.  . ferric citrate (AURYXIA) 1 GM 210 MG(Fe) tablet Take 210 mg by mouth as directed.  . fluticasone (FLONASE) 50 MCG/ACT nasal spray Place 1 spray into both nostrils daily as needed for allergies or rhinitis.  Marland Kitchen glipiZIDE (GLUCOTROL) 5 MG tablet Take 0.5 tablets (2.5 mg total) by mouth 2 (two) times daily before a meal. Take for Diabetes----Hold if you Skip a Meal  . HYDROcodone-acetaminophen (NORCO) 5-325 MG tablet Take 1 tablet by mouth every 6 (six) hours as needed for moderate pain.  . multivitamin (RENA-VIT) TABS tablet Take 1 tablet by mouth daily.   No current facility-administered medications for this visit.  (Other)      REVIEW OF SYSTEMS: ROS    Positive for: Genitourinary, Endocrine, Eyes   Negative for: Constitutional, Gastrointestinal, Neurological, Skin, Musculoskeletal, HENT, Cardiovascular, Respiratory, Psychiatric, Allergic/Imm, Heme/Lymph   Last edited by Roselee Nova D, COT on 04/03/2019 12:52 PM. (History)       ALLERGIES Allergies  Allergen Reactions  . Food  Melons-itchy/headaches    PAST MEDICAL HISTORY Past Medical History:  Diagnosis Date  . Anemia   . Anxiety    situational  . Diabetes mellitus without complication ([place])   . Diabetic retinopathy ([place])    PDR OU  . ESRD on dialysis [facility])    Dialyzing T-Th-S  . History of blood transfusion   . Hypertension   . PONV (postoperative nausea and vomiting)    03/22/2019- only 15 years ago  . Renal disorder   . Stroke [facility]) 2019   vision   Past Surgical History:  Procedure Laterality Date  .  A/V FISTULAGRAM N/A 03/11/2019   Procedure: A/V FISTULAGRAM - Left Arm;  Surgeon: Waynetta Sandy, MD;  Location: [place] [facility];  Service: Cardiovascular;  Laterality: N/A;  . arm surgery Right    Fracture  . AV FISTULA PLACEMENT Left 01/25/2019   Procedure: ARTERIOVENOUS (AV) FISTULA CREATION  LEFT UPPER  ARM;  Surgeon: Serafina Mitchell, MD;  Location: [place];  Service: Vascular;  Laterality: Left;  . BASCILIC VEIN TRANSPOSITION Left 03/27/2019   Procedure: SECOND STAGE BASILIC VEIN TRANSPOSITION LEFT ARM;  Surgeon: Serafina Mitchell, MD;  Location: [place];  Service: Vascular;  Laterality: Left;  . WISDOM TOOTH EXTRACTION      FAMILY HISTORY Family History  Problem Relation Age of Onset  . Diabetes Mellitus II Mother   . Hypertension Mother   . Cataracts Mother   . Diabetes Mellitus II Father   . Stroke Father   . Hypertension Father   . Glaucoma Maternal Grandmother   . Amblyopia Neg Hx   . Blindness Neg Hx   . Macular degeneration Neg Hx   . Retinal detachment Neg Hx   . Strabismus Neg Hx   . Retinitis pigmentosa Neg Hx     SOCIAL HISTORY Social History   Tobacco Use  . Smoking status: Never Smoker  . Smokeless tobacco: Never Used  Substance Use Topics  . Alcohol use: Not Currently  . Drug use: Not Currently         OPHTHALMIC EXAM:  Base Eye Exam    Visual Acuity (Snellen - Linear)      Right Left   Dist [place] 20/300 20/60 -2   Dist ph [place] NI 20/50 -2       Tonometry (Tonopen, 1:02 PM)      Right Left   Pressure 18 24       Pupils      Dark Light Shape React APD   Right 6 4 Round Brisk None   Left 6 4 Round Brisk None       Visual Fields (Counting fingers)      Left Right   Restrictions Partial outer superior nasal, inferior nasal deficiencies Partial outer inferior nasal deficiency       Extraocular Movement      Right Left    Full, Ortho Full, Ortho       Neuro/Psych    Oriented x3: Yes   Mood/Affect: Normal       Dilation     Both eyes: 1.0% Mydriacyl, 2.5% Phenylephrine @ 1:02 PM        Slit Lamp and Fundus Exam    Slit Lamp Exam      Right Left   Lids/Lashes Normal Normal   Conjunctiva/Sclera White and quiet White and quiet   Cornea Clear Clear   Anterior Chamber Deep and quiet Deep and quiet   Iris Round and dilated, No  NVI Round and dilated, no NVI   Lens 1+ Nuclear sclerosis, 1+ Cortical cataract 1+ Nuclear sclerosis, 1+ Cortical cataract   Vitreous diffuse subhyaloid heme temporal hemisphere; Old white VH settled inferiorly Trace, old white VH settled inferiorly       Fundus Exam      Right Left   Disc Pink and sharp, Fine NVD, mild fibrosis Mild pallor, sharp rim, fine / regressing NVD   C/D Ratio 0.1 0.2   Macula Hazy view obscured by Sub-hyaloid and vitreous heme  Flat, Blunted foveal reflex, sclerotic arterioiles, scattered focal exudate, diffuse atrophy   Vessels Vascular attenuation, Tortuous, +fibrotic NVE along ST arcades Sclerotic arterioles, Vascular attenuation, Tortuous   Periphery Attached, boat shaped heme inferior to arcades, good 360 PRP, diffuse subhyaloid heme ST quadrant and overlying macula Attached; scattered IRH; good 360 PRP in place        Refraction    Manifest Refraction      Sphere Cylinder Axis Dist VA   Right -2.00 +0.50 010 20/300   Left -1.00 +0.50 165 20/50+2          IMAGING AND PROCEDURES  Imaging and Procedures for @TODAY @  OCT, Retina - OU - Both Eyes       Right Eye Quality was good. Central Foveal Thickness: 454. Progression has worsened. Findings include abnormal foveal contour, intraretinal fluid, intraretinal hyper-reflective material, subretinal fluid, preretinal fibrosis, vitreous traction, epiretinal membrane (Interval progression of pre retinal fibrosis, scattered subhyaloid and vitreous heme).   Left Eye Quality was good. Central Foveal Thickness: 232. Progression has improved. Findings include abnormal foveal contour, intraretinal  hyper-reflective material, no SRF, inner retinal atrophy, epiretinal membrane, no IRF, preretinal fibrosis, outer retinal atrophy (Interval improvement in IRF/IRHM -- now with diffuse retinal atrophy).   Notes *Images captured and stored on drive  Diagnosis / Impression:  OD: Interval progression of pre retinal fibrosis, scattered subhyaloid and vitreous heme OS: Interval improvement in IRF/IRHM -- now with diffuse retinal atrophy   Clinical management:  See below  Abbreviations: NFP - Normal foveal profile. CME - cystoid macular edema. PED - pigment epithelial detachment. IRF - intraretinal fluid. SRF - subretinal fluid. EZ - ellipsoid zone. ERM - epiretinal membrane. ORA - outer retinal atrophy. ORT - outer retinal tubulation. SRHM - subretinal hyper-reflective material         Intravitreal Injection, Pharmacologic Agent - OD - Right Eye       Time Out 04/03/2019. 1:36 PM. Confirmed correct patient, procedure, site, and patient consented.   Anesthesia Topical anesthesia was used. Anesthetic medications included Lidocaine 2%, Proparacaine 0.5%.   Procedure Preparation included 5% betadine to ocular surface, eyelid speculum. A supplied needle was used.   Injection:  1.25 mg Bevacizumab (AVASTIN) SOLN   NDC: [id number], Lot: [id number] , Expiration date: 05/29/2019   Route: Intravitreal, Site: Right Eye, Waste: 0 mg  Post-op Post injection exam found visual acuity of at least counting fingers. The patient tolerated the procedure well. There were no complications. The patient received written and verbal post procedure care education.                 ASSESSMENT/PLAN:   ICD-10-CM   1. Proliferative diabetic retinopathy of both eyes with macular edema associated with type 2 diabetes mellitus (HCC)  06/11/2019 Intravitreal Injection, Pharmacologic Agent - OD - Right Eye    Bevacizumab (AVASTIN) SOLN 1.25 mg  2. History of noncompliance with medical treatment  Z91.19    3. Hypertensive retinopathy of both  eyes  H35.033   4. Essential hypertension  I10   5. Retinal edema  H35.81 OCT, Retina - OU - Both Eyes  6. Acute CVA (cerebrovascular accident) ([place])  I63.9      1,2. Proliferative diabetic retinopathy OU  - s/p PRP OD (10.02.19), fill-in (01.22.20), fill in OD (02.12.20)   - s/p PRP OS (05/02/2018), fill-in (02.19.20)  - s/p IVK #1 OD (01.22.20), OS (02.12.20)  - FA 8.19.19 shows leaking MA, patches of capillary nonperfusion OU  - pt has been lost to f/u since 02.19.2020  - discussed the importance of compliance with medical therapy and follow ups  - presents acutely today (10.21.2020) for decreased vision and new onset floaters OD  - today, BCVA 20/300 OD; OS 20/50  - exam shows fresh VH, preretinal and subhyaloid hemorrhage, mildly increased preretinal fibrosis OD  - OCT shows interval progression of pre retinal fibrosis, scattered subhyaloid and vitreous heme OD; OS w/ interval improvement in IRF/IRHM -- now with diffuse retinal atrophy  - recommend IVA OD #1 today, 10.21.20  - RBA of procedure discussed, questions answered; specifically discussed reported increased risk of heart attack and stroke with anti-VEGF medications -- pt verbalized understanding  - informed consent obtained and signed  - see procedure note  - VH precautions reviewed -- minimize activities, keep head elevated, avoid ASA/NSAIDs/blood thinners as able  - f/u 2-3 weeks -- DFE/OCT/ f/u VH OD  3-5. 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 [name] in mid August 2019 with a 2 wk history intractable headaches and decreased vision OU - found to have BP of 170s/90s -- sent to ED and was admitted to [facility] where SBP was up to 200 - discussed importance of tight BP control - pt reports improved compliance with BP medications and improvement in BP  6. Acute CVA secondary to uncontrolled HTN - MRI on 8.14.19: IMPRESSION: 1. Limited 2  sequence MRI head: Acute RIGHT periatrial subcentimeter nonhemorrhagic infarct, in region of optic tract. - could be limiting vision -- will check formal visual field once macular edema improves    Ophthalmic Meds Ordered this visit:  Meds ordered this encounter  Medications  . Bevacizumab (AVASTIN) SOLN 1.25 mg       Return for f/u 2-3 weeks.  There are no Patient Instructions on file for this visit.   Explained the diagnoses, plan, and follow up with the patient and they expressed understanding.  Patient expressed understanding of the importance of proper follow up care.   This document serves as a record of services personally performed by Gardiner Sleeper, MD, PhD. It was created on their behalf by Ernest Mallick, OA, an ophthalmic assistant. The creation of this record is the provider's dictation and/or activities during the visit.    Electronically signed by: Ernest Mallick, OA 10.21.2020 8:55 PM   Gardiner Sleeper, M.D., Ph.D. Diseases & Surgery of the Retina and Vitreous [facility]  I have reviewed the above documentation for accuracy and completeness, and I agree with the above. Gardiner Sleeper, M.D., Ph.D. 04/03/19 8:55 PM   Abbreviations: M myopia (nearsighted); A astigmatism; H hyperopia (farsighted); P presbyopia; Mrx spectacle prescription;  CTL contact lenses; OD right eye; OS left eye; OU both eyes  XT exotropia; ET esotropia; PEK punctate epithelial keratitis; PEE punctate epithelial erosions; DES dry eye syndrome; MGD meibomian gland dysfunction; ATs artificial tears; PFAT's preservative free artificial tears; [place] nuclear sclerotic cataract; [place]  posterior subcapsular cataract; ERM epi-retinal membrane; PVD posterior vitreous detachment; RD retinal detachment; DM diabetes mellitus; DR diabetic retinopathy; NPDR non-proliferative diabetic retinopathy; PDR proliferative diabetic retinopathy; CSME clinically significant macular edema; DME diabetic  macular edema; dbh dot blot hemorrhages; CWS cotton wool spot; POAG primary open angle glaucoma; C/D cup-to-disc ratio; HVF humphrey visual field; GVF goldmann visual field; OCT optical coherence tomography; IOP intraocular pressure; BRVO Branch retinal vein occlusion; CRVO central retinal vein occlusion; CRAO central retinal artery occlusion; BRAO branch retinal artery occlusion; RT retinal tear; SB scleral buckle; PPV pars plana vitrectomy; VH Vitreous hemorrhage; PRP panretinal laser photocoagulation; IVK intravitreal kenalog; VMT vitreomacular traction; MH Macular hole;  NVD neovascularization of the disc; NVE neovascularization elsewhere; AREDS age related eye disease study; ARMD age related macular degeneration; POAG primary open angle glaucoma; EBMD epithelial/anterior basement membrane dystrophy; ACIOL anterior chamber intraocular lens; IOL intraocular lens; PCIOL posterior chamber intraocular lens; Phaco/IOL phacoemulsification with intraocular lens placement; [place] photorefractive keratectomy; LASIK laser assisted in situ keratomileusis; HTN hypertension; DM diabetes mellitus; COPD chronic obstructive pulmonary disease

## 2019-04-17 ENCOUNTER — Ambulatory Visit: Payer: Self-pay

## 2019-04-18 ENCOUNTER — Ambulatory Visit (INDEPENDENT_AMBULATORY_CARE_PROVIDER_SITE_OTHER): Payer: Self-pay | Admitting: Family

## 2019-04-18 ENCOUNTER — Encounter: Payer: Self-pay | Admitting: Family

## 2019-04-18 ENCOUNTER — Other Ambulatory Visit: Payer: Self-pay

## 2019-04-18 VITALS — BP 78/47 | HR 96 | Temp 97.4°F | Resp 20 | Ht 71.0 in | Wt 172.0 lb

## 2019-04-18 DIAGNOSIS — N186 End stage renal disease: Secondary | ICD-10-CM

## 2019-04-18 DIAGNOSIS — Z992 Dependence on renal dialysis: Secondary | ICD-10-CM

## 2019-04-18 DIAGNOSIS — I770 Arteriovenous fistula, acquired: Secondary | ICD-10-CM

## 2019-04-18 NOTE — Progress Notes (Signed)
CC: Follow up 2nd stage basilic vein fistula creation  History of Present Illness  Bill [name] is a 38 y.o. (May 06, 1981) male who is s/p left second stage basilic vein fistula creation on 03-27-19 by Dr. Trula Slade. First stage was 01-25-19.    Since that time he has had some numbness and tingling of his left hand.  Numbness is related to his left fourth and fifth fingers only on the dorsum of the hand.  At the time of procedure his proximal most aspect of the fistula was excluded and a new arterial anastomosis was created.  He had complaint of hematoma in the left upper extremity when Dr. Donzetta Matters evaluated pt on 03-29-19.  Dr. Donzetta Matters evaluated pt on 03-29-19. At that time numbness was confined to his fourth and fifth finger and possibly associated with hematoma in the upper arm and the numbness at the time of dialysis likely associated with hypotension.  His steal study was unremarkable, he had a palpable radial pulse on exam without compression of the fistula.  Dr. Donzetta Matters discussed with him continued nonoperative management.  Certainly if his hand is to get worse or he has motor deficits he would need ligation of the fistula otherwise he was to keep his regularly scheduled follow-up on April 17 2019.  He returns today for scheduled follow up. He states that the numbness and tingling in his left 4th and 5th fingers has improved, states the numbness and tingling come and go.   He dialyzes TTS via right IJ TDC. His left upper arm access is his first permanent access.  He is right hand dominant.   He is hypotensive now but denies feeling light headed; he dialyzed this morning.   He is seeing an ophthalmologist re right eye blurriness.     Past Medical History:  Diagnosis Date  . Anemia   . Anxiety    situational  . Diabetes mellitus without complication ([place])   . Diabetic retinopathy ([place])    PDR OU  . ESRD on dialysis [facility])    Dialyzing T-Th-S  . History of blood transfusion   .  Hypertension   . PONV (postoperative nausea and vomiting)    03/22/2019- only 15 years ago  . Renal disorder   . Stroke [facility]) 2019   vision    Social History Social History   Tobacco Use  . Smoking status: Never Smoker  . Smokeless tobacco: Never Used  Substance Use Topics  . Alcohol use: Not Currently  . Drug use: Not Currently    Family History Family History  Problem Relation Age of Onset  . Diabetes Mellitus II Mother   . Hypertension Mother   . Cataracts Mother   . Diabetes Mellitus II Father   . Stroke Father   . Hypertension Father   . Glaucoma Maternal Grandmother   . Amblyopia Neg Hx   . Blindness Neg Hx   . Macular degeneration Neg Hx   . Retinal detachment Neg Hx   . Strabismus Neg Hx   . Retinitis pigmentosa Neg Hx     Surgical History Past Surgical History:  Procedure Laterality Date  . A/V FISTULAGRAM N/A 03/11/2019   Procedure: A/V FISTULAGRAM - Left Arm;  Surgeon: Waynetta Sandy, MD;  Location: [place] [facility];  Service: Cardiovascular;  Laterality: N/A;  . arm surgery Right    Fracture  . AV FISTULA PLACEMENT Left 01/25/2019   Procedure: ARTERIOVENOUS (AV) FISTULA CREATION  LEFT UPPER  ARM;  Surgeon: Trula Slade,  Butch Penny, MD;  Location: [place];  Service: Vascular;  Laterality: Left;  . BASCILIC VEIN TRANSPOSITION Left 03/27/2019   Procedure: SECOND STAGE BASILIC VEIN TRANSPOSITION LEFT ARM;  Surgeon: Serafina Mitchell, MD;  Location: [place];  Service: Vascular;  Laterality: Left;  . WISDOM TOOTH EXTRACTION      Allergies  Allergen Reactions  . Food     Melons-itchy/headaches    Current Outpatient Medications  Medication Sig Dispense Refill  . aspirin (BAYER ASPIRIN EC LOW DOSE) 81 MG EC tablet Take 81 mg by mouth as needed.    Marland Kitchen atorvastatin (LIPITOR) 80 MG tablet Take 1 tablet (80 mg total) by mouth every evening. 30 tablet 12  . AURYXIA 1 GM 210 MG(Fe) tablet Take 210 mg by mouth 3 (three) times daily with meals.    . ferric citrate  (AURYXIA) 1 GM 210 MG(Fe) tablet Take 210 mg by mouth as directed.    . fluticasone (FLONASE) 50 MCG/ACT nasal spray Place 1 spray into both nostrils daily as needed for allergies or rhinitis.    Marland Kitchen glipiZIDE (GLUCOTROL) 5 MG tablet Take 0.5 tablets (2.5 mg total) by mouth 2 (two) times daily before a meal. Take for Diabetes----Hold if you Skip a Meal 30 tablet 2  . multivitamin (RENA-VIT) TABS tablet Take 1 tablet by mouth daily.     No current facility-administered medications for this visit.      REVIEW OF SYSTEMS: see HPI for pertinent positives and negatives    PHYSICAL EXAMINATION:  Vitals:   04/18/19 1321  BP: (!) 78/47  Pulse: 96  Resp: 20  Temp: (!) 97.4 F (36.3 C)  SpO2: 99%  Weight: 172 lb (78 kg)  Height: 5\' 11"  (1.803 m)   Body mass index is 23.99 kg/m.  General: Young male in NAD HEENT:  No gross abnormalities Pulmonary: Respirations are non-labored, fair air movement in all fields. No rales, rhonchi, or wheezes.  Abdomen: Soft and non-tender with normal bowel sounds. Musculoskeletal: There are no major deformities.   Neurologic: No focal weakness or paresthesias are detected, hand grip strength: 5/5 right, 4/5 left  Skin: There are no ulcer or rashes noted. Psychiatric: The patient has normal affect. Cardiovascular: There is a regular rate and rhythm without significant murmur appreciated.  Right IJ TDC in place. Radial pulses: right is 2+, left is 1+ palpable.  Bruit and thrill present left upper arm AVF.   Non-Invasive Vascular Imaging None since 03-29-19  Medical Decision Making  Bill [name] is a 38 y.o. male who is s/p left second stage basilic vein fistula creation on 03-27-19 by Dr. Trula Slade. First stage was 01-25-19.    The steal symptoms in his left hand have improved significantly. Left radial pulse is 1+ palpable.   I discussed with Dr. Scot Dock when cannulation of his left upper arm AVF could start; may access in a month. In the meantime I  advised pt to squeeze an object in his left hand several times/day to enlarge the AVF for easier cannulation.  Follow up with [facility] as needed.    Bill Chambers, RN, MSN, FNP-C [facility] Office: [phone]  04/18/2019, 1:27 PM  Clinic MD: Scot Dock in vein procedures

## 2019-04-19 NOTE — Progress Notes (Addendum)
[facility] Note  04/26/2019     CHIEF COMPLAINT Patient presents for Retina Follow Up   HISTORY OF PRESENT ILLNESS: Bill [name] is a 38 y.o. male who presents to the clinic today for:   HPI    Retina Follow Up    Patient presents with  Diabetic Retinopathy.  In right eye.  This started 3 weeks ago.  Severity is severe.  Duration of weeks.  Since onset it is stable.  I, the attending physician,  performed the HPI with the patient and updated documentation appropriately.          Comments    BS yesterday: 164 Patient states his vision is about the same.  Patient denies eye pain or discomfort and denies any new or worsening floaters or fol OU.       Last edited by [name], MD on 04/26/2019  8:34 AM. (History)    pt feels like his left eye vision is stable, and his right eye vision may be a little better, but is being slow to clear up, he states he is still on dialysis and his blood pressure is running low   Referring physician: Monna Fam, MD [place],  [place] [id number]  HISTORICAL INFORMATION:   Selected notes from the MEDICAL RECORD NUMBER Referred by Dr. [name] for concern of severe NPDR / PDR OU; LEE-  Ocular Hx-  PMH-     CURRENT MEDICATIONS: No current outpatient medications on file. (Ophthalmic Drugs)   No current facility-administered medications for this visit.  (Ophthalmic Drugs)   Current Outpatient Medications (Other)  Medication Sig  . aspirin (BAYER ASPIRIN EC LOW DOSE) 81 MG EC tablet Take 81 mg by mouth as needed.  Marland Kitchen atorvastatin (LIPITOR) 80 MG tablet Take 1 tablet (80 mg total) by mouth every evening.  [name] 1 GM 210 MG(Fe) tablet Take 210 mg by mouth 3 (three) times daily with meals.  . ferric citrate (AURYXIA) 1 GM 210 MG(Fe) tablet Take 210 mg by mouth as directed.  . fluticasone (FLONASE) 50 MCG/ACT nasal spray Place 1 spray into both nostrils daily as needed for allergies or  rhinitis.  Marland Kitchen glipiZIDE (GLUCOTROL) 5 MG tablet Take 0.5 tablets (2.5 mg total) by mouth 2 (two) times daily before a meal. Take for Diabetes----Hold if you Skip a Meal  . multivitamin (RENA-VIT) TABS tablet Take 1 tablet by mouth daily.   No current facility-administered medications for this visit.  (Other)      REVIEW OF SYSTEMS: ROS    Positive for: Genitourinary, Endocrine, Eyes   Negative for: Constitutional, Gastrointestinal, Neurological, Skin, Musculoskeletal, HENT, Cardiovascular, Respiratory, Psychiatric, Allergic/Imm, Heme/Lymph   Last edited by Doneen Poisson on 04/26/2019  7:48 AM. (History)       ALLERGIES Allergies  Allergen Reactions  . Food     Melons-itchy/headaches    PAST MEDICAL HISTORY Past Medical History:  Diagnosis Date  . Anemia   . Anxiety    situational  . Diabetes mellitus without complication ([place])   . Diabetic retinopathy ([place])    PDR OU  . ESRD on dialysis [facility])    Dialyzing T-Th-S  . History of blood transfusion   . Hypertension   . PONV (postoperative nausea and vomiting)    03/22/2019- only 15 years ago  . Renal disorder   . Stroke [facility]) 2019   vision   Past Surgical History:  Procedure Laterality Date  . A/V FISTULAGRAM N/A  03/11/2019   Procedure: A/V FISTULAGRAM - Left Arm;  Surgeon: Waynetta Sandy, MD;  Location: [facility];  Service: Cardiovascular;  Laterality: N/A;  . arm surgery Right    Fracture  . AV FISTULA PLACEMENT Left 01/25/2019   Procedure: ARTERIOVENOUS (AV) FISTULA CREATION  LEFT UPPER  ARM;  Surgeon: Serafina Mitchell, MD;  Location: [facility];  Service: Vascular;  Laterality: Left;  . BASCILIC VEIN TRANSPOSITION Left 03/27/2019   Procedure: SECOND STAGE BASILIC VEIN TRANSPOSITION LEFT ARM;  Surgeon: Serafina Mitchell, MD;  Location: [facility];  Service: Vascular;  Laterality: Left;  . WISDOM TOOTH EXTRACTION      FAMILY HISTORY Family History  Problem Relation Age of Onset  . Diabetes Mellitus II  Mother   . Hypertension Mother   . Cataracts Mother   . Diabetes Mellitus II Father   . Stroke Father   . Hypertension Father   . Glaucoma Maternal Grandmother   . Amblyopia Neg Hx   . Blindness Neg Hx   . Macular degeneration Neg Hx   . Retinal detachment Neg Hx   . Strabismus Neg Hx   . Retinitis pigmentosa Neg Hx     SOCIAL HISTORY Social History   Tobacco Use  . Smoking status: Never Smoker  . Smokeless tobacco: Never Used  Substance Use Topics  . Alcohol use: Not Currently  . Drug use: Not Currently         OPHTHALMIC EXAM:  Base Eye Exam    Visual Acuity (Snellen - Linear)      Right Left   Dist [place] CF @ 4' 20/250 -1   Dist ph [place] NI 20/50 -1       Tonometry (Tonopen, 7:58 AM)      Right Left   Pressure 15 20       Pupils      Dark Light Shape React APD   Right 5 4 Round Brisk 0   Left 5 4 Round Brisk 0       Visual Fields      Left Right   Restrictions Partial outer superior temporal deficiency Partial outer inferior temporal, superior nasal deficiencies       Extraocular Movement      Right Left    Full Full       Neuro/Psych    Oriented x3: Yes   Mood/Affect: Normal       Dilation    Both eyes: 1.0% Mydriacyl, 2.5% Phenylephrine @ 7:58 AM        Slit Lamp and Fundus Exam    Slit Lamp Exam      Right Left   Lids/Lashes Normal Normal   Conjunctiva/Sclera White and quiet White and quiet   Cornea Clear Clear   Anterior Chamber Deep and quiet Deep and quiet   Iris Round and dilated, No NVI Round and dilated, no NVI   Lens 1+ Nuclear sclerosis, 1+ Cortical cataract 1+ Nuclear sclerosis, 1+ Cortical cataract   Vitreous diffuse subhyaloid heme temporal hemisphere; Old white VH settled inferiorly Trace, old white VH settled inferiorly       Fundus Exam      Right Left   Disc Pink and sharp, Fine NVD, mild fibrosis Mild pallor, sharp rim, fine NVD, +fibrosis   C/D Ratio 0.1 0.2   Macula Hazier view obscured by Sub-hyaloid and vitreous  heme  Flat, Blunted foveal reflex, sclerotic arterioiles, scattered focal exudate, diffuse atrophy, flat choroidal nevus superior macula  Vessels Vascular attenuation, Tortuous, +fibrotic NVE along ST arcades Sclerotic arterioles, Vascular attenuation, Tortuous   Periphery Attached, boat shaped heme inferior to arcades -- increased, good 360 PRP, diffuse subhyaloid heme ST quadrant and overlying macula Attached; scattered IRH; good 360 PRP in place        Refraction    Manifest Refraction      Sphere Cylinder Axis Dist VA   Right -2.00 +1.00 015 20/400   Left -1.50 +0.50 165 20/50-1          IMAGING AND PROCEDURES  Imaging and Procedures for @TODAY @  OCT, Retina - OU - Both Eyes       Right Eye Quality was good. Central Foveal Thickness: 454. Progression has worsened. Findings include abnormal foveal contour, intraretinal fluid, intraretinal hyper-reflective material, subretinal fluid, preretinal fibrosis, vitreous traction, epiretinal membrane (Interval progression of pre retinal fibrosis, scattered subhyaloid and vitreous heme, interval increase in vitreous opacities).   Left Eye Quality was good. Central Foveal Thickness: 238. Progression has been stable. Findings include abnormal foveal contour, intraretinal hyper-reflective material, no SRF, inner retinal atrophy, epiretinal membrane, no IRF, preretinal fibrosis, outer retinal atrophy (Diffuse atrophy, stable from prior).   Notes *Images captured and stored on drive  Diagnosis / Impression:  OD: Interval progression of pre retinal fibrosis, scattered subhyaloid, vitreous heme, and vitreous opacities [id number] atrophy, stable from prior    Clinical management:  See below  Abbreviations: NFP - Normal foveal profile. CME - cystoid macular edema. PED - pigment epithelial detachment. IRF - intraretinal fluid. SRF - subretinal fluid. EZ - ellipsoid zone. ERM - epiretinal membrane. ORA - outer retinal atrophy. ORT - outer  retinal tubulation. SRHM - subretinal hyper-reflective material                  ASSESSMENT/PLAN:   ICD-10-CM   1. Proliferative diabetic retinopathy of both eyes with macular edema associated with type 2 diabetes mellitus ([place])  [id number] CANCELED: Intravitreal Injection, Pharmacologic Agent - OD - Right Eye  2. History of noncompliance with medical treatment  Z91.19   3. Hypertensive retinopathy of both eyes  H35.033   4. Essential hypertension  I10   5. Retinal edema  H35.81 OCT, Retina - OU - Both Eyes  6. Acute CVA (cerebrovascular accident) ([place])  I63.9      1,2. Proliferative diabetic retinopathy OU  - s/p PRP OD (10.02.19), fill-in (01.22.20), fill in OD (02.12.20)   - s/p PRP OS (05/02/2018), fill-in (02.19.20)  - s/p IVK #1 OD (01.22.20), OS (02.12.20)  - s/p IVA OD #1 (10.21.20)  - FA 8.19.19 shows leaking MA, patches of capillary nonperfusion OU  - pt was lost to f/u from 02.19.2020 to 10.21.20  - discussed the importance of compliance with medical therapy and follow ups  - presented acutely (10.21.2020) for decreased vision and new onset floaters OD  - today, BCVA CF@4 ' OD; OS 20/50  - exam shows persistent VH, preretinal and subhyaloid hemorrhage, mildly increased preretinal fibrosis OD   - OCT shows interval progression of pre retinal fibrosis, scattered subhyaloid, vitreous heme and vitreous opacities, OD; OS with stable diffuse atrophy  - VH precautions reviewed -- minimize activities, keep head elevated, avoid ASA/NSAIDs/blood thinners as able  - discussed possibility of surgery being needed in the future to stabilize eye and prevent further progression of diabetic eye disease  - f/u next Wedsnesday, November 18 -- DFE/OCT/ f/u VH OD, likely injection OD  3-5. Severe hypertensive retinopathy w/ macular edema OU  -  presented to Dr. [name] in mid August 2019 with a 2 wk history intractable headaches and decreased vision OU  - found to have BP of 170s/90s --  sent to ED and was admitted to [facility] where SBP was up to 200  - discussed importance of tight BP control  - pt reports improved compliance with BP medications and improvement in BP  6. Acute CVA secondary to uncontrolled HTN  - MRI on 8.14.19:  IMPRESSION:   1. Limited 2 sequence MRI head: Acute RIGHT periatrial subcentimeter nonhemorrhagic infarct, in region of optic tract.  - could be limiting vision -- will check formal visual field once macular edema improves    Ophthalmic Meds Ordered this visit:  No orders of the defined types were placed in this encounter.      Return in about 5 days (around 05/01/2019) for f/u VH OD, DFE, OCT.  There are no Patient Instructions on file for this visit.   Explained the diagnoses, plan, and follow up with the patient and they expressed understanding.  Patient expressed understanding of the importance of proper follow up care.   This document serves as a record of services personally performed by Gardiner Sleeper, MD, PhD. It was created on their behalf by Ernest Mallick, OA, an ophthalmic assistant. The creation of this record is the provider's dictation and/or activities during the visit.    Electronically signed by: Ernest Mallick, OA 11.06.2020 10:32 PM   Gardiner Sleeper, M.D., Ph.D. Diseases & Surgery of the Retina and Vitreous [facility]  I have reviewed the above documentation for accuracy and completeness, and I agree with the above. Gardiner Sleeper, M.D., Ph.D. 04/28/19 10:32 PM    Abbreviations: M myopia (nearsighted); A astigmatism; H hyperopia (farsighted); P presbyopia; Mrx spectacle prescription;  CTL contact lenses; OD right eye; OS left eye; OU both eyes  XT exotropia; ET esotropia; PEK punctate epithelial keratitis; PEE punctate epithelial erosions; DES dry eye syndrome; MGD meibomian gland dysfunction; ATs artificial tears; PFAT's preservative free artificial tears; Boody nuclear sclerotic  cataract; PSC posterior subcapsular cataract; ERM epi-retinal membrane; PVD posterior vitreous detachment; RD retinal detachment; DM diabetes mellitus; DR diabetic retinopathy; NPDR non-proliferative diabetic retinopathy; PDR proliferative diabetic retinopathy; CSME clinically significant macular edema; DME diabetic macular edema; dbh dot blot hemorrhages; CWS cotton wool spot; POAG primary open angle glaucoma; C/D cup-to-disc ratio; HVF humphrey visual field; GVF goldmann visual field; OCT optical coherence tomography; IOP intraocular pressure; BRVO Branch retinal vein occlusion; CRVO central retinal vein occlusion; CRAO central retinal artery occlusion; BRAO branch retinal artery occlusion; RT retinal tear; SB scleral buckle; PPV pars plana vitrectomy; VH Vitreous hemorrhage; PRP panretinal laser photocoagulation; IVK intravitreal kenalog; VMT vitreomacular traction; MH Macular hole;  NVD neovascularization of the disc; NVE neovascularization elsewhere; AREDS age related eye disease study; ARMD age related macular degeneration; POAG primary open angle glaucoma; EBMD epithelial/anterior basement membrane dystrophy; ACIOL anterior chamber intraocular lens; IOL intraocular lens; PCIOL posterior chamber intraocular lens; Phaco/IOL phacoemulsification with intraocular lens placement; Schriever photorefractive keratectomy; LASIK laser assisted in situ keratomileusis; HTN hypertension; DM diabetes mellitus; COPD chronic obstructive pulmonary disease

## 2019-04-26 ENCOUNTER — Ambulatory Visit (INDEPENDENT_AMBULATORY_CARE_PROVIDER_SITE_OTHER): Payer: Self-pay | Admitting: Ophthalmology

## 2019-04-26 VITALS — BP 135/76 | HR 76

## 2019-04-26 DIAGNOSIS — I639 Cerebral infarction, unspecified: Secondary | ICD-10-CM

## 2019-04-26 DIAGNOSIS — Z9119 Patient's noncompliance with other medical treatment and regimen: Secondary | ICD-10-CM

## 2019-04-26 DIAGNOSIS — H3581 Retinal edema: Secondary | ICD-10-CM

## 2019-04-26 DIAGNOSIS — H35033 Hypertensive retinopathy, bilateral: Secondary | ICD-10-CM

## 2019-04-26 DIAGNOSIS — I10 Essential (primary) hypertension: Secondary | ICD-10-CM

## 2019-04-26 DIAGNOSIS — Z91199 Patient's noncompliance with other medical treatment and regimen due to unspecified reason: Secondary | ICD-10-CM

## 2019-04-26 DIAGNOSIS — E113513 Type 2 diabetes mellitus with proliferative diabetic retinopathy with macular edema, bilateral: Secondary | ICD-10-CM

## 2019-04-28 ENCOUNTER — Encounter (INDEPENDENT_AMBULATORY_CARE_PROVIDER_SITE_OTHER): Payer: Self-pay | Admitting: Ophthalmology

## 2019-05-01 ENCOUNTER — Encounter (INDEPENDENT_AMBULATORY_CARE_PROVIDER_SITE_OTHER): Payer: Self-pay | Admitting: Ophthalmology

## 2019-05-03 ENCOUNTER — Encounter (INDEPENDENT_AMBULATORY_CARE_PROVIDER_SITE_OTHER): Payer: Self-pay | Admitting: Ophthalmology

## 2019-05-10 ENCOUNTER — Ambulatory Visit: Payer: Self-pay | Admitting: Internal Medicine

## 2019-05-14 NOTE — Progress Notes (Signed)
[name]  05/15/2019     CHIEF COMPLAINT Patient presents for Retina Follow Up   HISTORY OF PRESENT ILLNESS: Bill [name] is a 38 y.o. male who presents to the clinic today for:   HPI    Retina Follow Up    Patient presents with  Diabetic Retinopathy.  In both eyes.  This started weeks ago.  Severity is moderate.  Duration of weeks.  Since onset it is stable.  I, the attending physician,  performed the HPI with the patient and updated documentation appropriately.          Comments    Last BS: 109 yesterday Pt states his vision is stable in both eyes.  He denies eye pain or discomfort and denies any new or worsening floaters or fol OU.       Last edited by Bill Caffey, MD on 05/15/2019  9:40 PM. (History)    pt states he does not think the vision in his right eye is getting better  Referring physician: Monna Fam, MD [place],  [place] [address]  HISTORICAL INFORMATION:   Selected notes from the MEDICAL RECORD NUMBER Referred by Dr. Hinton [name] for concern of severe NPDR / PDR OU; LEE-  Ocular Hx-  PMH-     CURRENT MEDICATIONS: No current outpatient medications on file. (Ophthalmic Drugs)   No current facility-administered medications for this visit.  (Ophthalmic Drugs)   Current Outpatient Medications (Other)  Medication Sig  . aspirin (Bill [name]) 81 MG EC tablet Take 81 mg by mouth as needed.  Marland Kitchen atorvastatin (LIPITOR) 80 MG tablet Take 1 tablet (80 mg total) by mouth every evening.  Bill [name] 1 GM 210 MG([name]) tablet Take 210 mg by mouth 3 (three) times daily with meals.  . ferric citrate (AURYXIA) 1 GM 210 MG([name]) tablet Take 210 mg by mouth as directed.  . fluticasone (FLONASE) 50 MCG/ACT nasal spray Place 1 spray into both nostrils daily as needed for allergies or rhinitis.  Marland Kitchen glipiZIDE (GLUCOTROL) 5 MG tablet Take 0.5 tablets (2.5 mg total) by mouth 2 (two) times daily before a meal.  Take for Diabetes----Hold if you Skip a Meal  . multivitamin (RENA-VIT) TABS tablet Take 1 tablet by mouth daily.   No current facility-administered medications for this visit.  (Other)      REVIEW OF SYSTEMS: ROS    Positive for: Genitourinary, Endocrine, Eyes   Negative for: Constitutional, Gastrointestinal, Neurological, Skin, Musculoskeletal, HENT, Cardiovascular, Respiratory, Psychiatric, Allergic/Imm, Heme/Lymph   Last edited by Bill [name] on 05/15/2019  1:41 PM. (History)       ALLERGIES Allergies  Allergen Reactions  . Food     Melons-itchy/headaches    PAST MEDICAL HISTORY Past Medical History:  Diagnosis Date  . Anemia   . Anxiety    situational  . Diabetes mellitus without complication (Bill [name])   . Diabetic retinopathy (Bill [name])    PDR OU  . ESRD on dialysis [name])    Dialyzing T-Th-S  . History of blood transfusion   . Hypertension   . PONV (postoperative nausea and vomiting)    03/22/2019- only 15 years ago  . Renal disorder   . Stroke [name]) 2019   vision   Past Surgical History:  Procedure Laterality Date  . A/V FISTULAGRAM N/A 03/11/2019   Procedure: A/V FISTULAGRAM - Left Arm;  Surgeon: Bill Sandy, MD;  Location: [facility];  Service: Cardiovascular;  Laterality: N/A;  . arm surgery Right    Fracture  . AV FISTULA PLACEMENT Left 01/25/2019   Procedure: ARTERIOVENOUS (AV) FISTULA CREATION  LEFT UPPER  ARM;  Surgeon: Bill Mitchell, MD;  Location: [place];  Service: Vascular;  Laterality: Left;  . BASCILIC VEIN TRANSPOSITION Left 03/27/2019   Procedure: SECOND STAGE BASILIC VEIN TRANSPOSITION LEFT ARM;  Surgeon: Bill Mitchell, MD;  Location: [place];  Service: Vascular;  Laterality: Left;  . WISDOM TOOTH EXTRACTION      FAMILY HISTORY Family History  Problem Relation Age of Onset  . Diabetes Mellitus II Mother   . Hypertension Mother   . Cataracts Mother   . Diabetes Mellitus II Father   . Stroke Father   . Hypertension  Father   . Glaucoma Maternal Grandmother   . Amblyopia Neg Hx   . Blindness Neg Hx   . Macular degeneration Neg Hx   . Retinal detachment Neg Hx   . Strabismus Neg Hx   . Retinitis pigmentosa Neg Hx     SOCIAL HISTORY Social History   Tobacco Use  . Smoking status: Never Smoker  . Smokeless tobacco: Never Used  Substance Use Topics  . Alcohol use: Not Currently  . Drug use: Not Currently         OPHTHALMIC EXAM:  Base Eye Exam    Visual Acuity (Snellen - Linear)      Right Left   Dist [place] CF @ 4' 20/200 +1   Dist ph Bill [name] 20/40 -2   Correction: Glasses       Tonometry (Tonopen, 1:47 PM)      Right Left   Pressure 19 23       Pupils      Dark Light Shape React APD   Right 4 3 Round Brisk 0   Left 4 3 Round Brisk 0       Visual Fields      Left Right   Restrictions Partial outer superior temporal deficiency Partial outer inferior temporal, superior nasal deficiencies       Extraocular Movement      Right Left    Full Full       Neuro/Psych    Oriented x3: Yes   Mood/Affect: Normal       Dilation    Both eyes: 1.0% Mydriacyl, 2.5% Phenylephrine @ 1:47 PM        Slit Lamp and Fundus Exam    Slit Lamp Exam      Right Left   Lids/Lashes Normal Normal   Conjunctiva/Sclera White and quiet White and quiet   Cornea Clear Clear   Anterior Chamber Deep and quiet Deep and quiet   Iris Round and dilated, No NVI Round and dilated, no NVI   Lens 1+ Nuclear sclerosis, 1+ Cortical cataract 1+ Nuclear sclerosis, 1+ Cortical cataract   Vitreous diffuse subhyaloid heme temporal hemisphere; Old white VH settled inferiorly Trace, old white VH settled inferiorly       Fundus Exam      Right Left   Disc Pale, sharp rim, Fine NVD, +fibrosis Mild pallor, sharp rim, +fibrosis; +fine NVD   C/D Ratio 0.1 0.2   Macula obscured by blood stained vitreous Flat, Blunted foveal reflex, sclerotic arterioiles, scattered focal exudate, diffuse atrophy, flat choroidal nevus  superior macula   Vessels Vascular attenuation, Tortuous, +fibrotic NVE along ST arcades Sclerotic arterioles, Vascular attenuation, Tortuous   Periphery Attached, nasal retina clear, large pre-retinal heme nasal to  disc, temporal hemisphere obscured by vitreous heme, 360 PRP Attached; scattered IRH; good 360 PRP in place          IMAGING AND PROCEDURES  Imaging and Procedures for @TODAY @  OCT, Retina - OU - Both Eyes       Right Eye Quality was good. Progression has worsened. Findings include abnormal foveal contour, intraretinal fluid, intraretinal hyper-reflective material, subretinal fluid, preretinal fibrosis, vitreous traction, epiretinal membrane (Persistent pre retinal fibrosis, scattered subhyaloid and vitreous heme, interval increase in vitreous opacities).   Left Eye Quality was good. Central Foveal Thickness: 236. Progression has been stable. Findings include abnormal foveal contour, intraretinal hyper-reflective material, no SRF, inner retinal atrophy, epiretinal membrane, no IRF, preretinal fibrosis, outer retinal atrophy (Diffuse atrophy, stable from prior).   Notes *Images captured and stored on drive  Diagnosis / Impression:  OD: Persistent pre-retinal fibrosis, scattered subhyaloid and vitreous heme, interval increase inn vitreous opacities OS: Diffuse atrophy, stable from prior    Clinical management:  See below  Abbreviations: NFP - Normal foveal profile. CME - cystoid macular edema. PED - pigment epithelial detachment. IRF - intraretinal fluid. SRF - subretinal fluid. EZ - ellipsoid zone. ERM - epiretinal membrane. ORA - outer retinal atrophy. ORT - outer retinal tubulation. SRHM - subretinal hyper-reflective material         Intravitreal Injection, Pharmacologic Agent - OD - Right Eye       Time Out 05/15/2019. 4:00 PM. Confirmed correct patient, procedure, site, and patient consented.   Anesthesia Topical anesthesia was used. Anesthetic medications  included Lidocaine 2%, Proparacaine 0.5%.   Procedure Preparation included 5% betadine to ocular surface, eyelid speculum. A 30 gauge needle was used.   Injection:  1.25 mg Bevacizumab (AVASTIN) SOLN   NDC: [id number], Lot: [id number] , Expiration date: 06/19/2019   Route: Intravitreal, Site: Right Eye, Waste: 0 mL  Post-op Post injection exam found visual acuity of at least counting fingers. The patient tolerated the procedure well. There were no complications. The patient received written and verbal post procedure care education.        Intravitreal Injection, Pharmacologic Agent - OS - Left Eye       Time Out 05/15/2019. 4:02 PM. Confirmed correct patient, procedure, site, and patient consented.   Anesthesia Topical anesthesia was used. Anesthetic medications included Lidocaine 2%, Proparacaine 0.5%.   Procedure Preparation included 5% betadine to ocular surface, eyelid speculum. A 30 gauge needle was used.   Injection:  1.25 mg Bevacizumab (AVASTIN) SOLN   NDC: 25/07/2018, Lot: [id number] , Expiration date: 07/19/2019   Route: Intravitreal, Site: Left Eye, Waste: 0 mL  Post-op Post injection exam found visual acuity of at least counting fingers. The patient tolerated the procedure well. There were no complications. The patient received written and verbal post procedure care education.                 ASSESSMENT/PLAN:   ICD-10-CM   1. Proliferative diabetic retinopathy of both eyes with macular edema associated with type 2 diabetes mellitus (HCC)  [id number] Intravitreal Injection, Pharmacologic Agent - OD - Right Eye    Intravitreal Injection, Pharmacologic Agent - OS - Left Eye    Bevacizumab (AVASTIN) SOLN 1.25 mg    Bevacizumab (AVASTIN) SOLN 1.25 mg  2. History of noncompliance with medical treatment  Z91.19   3. Hypertensive retinopathy of both eyes  H35.033   4. Essential hypertension  I10   5. Retinal edema  H35.81 OCT, Retina - OU - Both Eyes  6.  Acute  CVA (cerebrovascular accident) ([place])  I63.9      1,2. Proliferative diabetic retinopathy OU  - s/p PRP OD (10.02.19), fill-in (01.22.20), fill in OD (02.12.20)   - s/p PRP OS (05/02/2018), fill-in (02.19.20)  - s/p IVK #1 OD (01.22.20), OS (02.12.20)  - s/p IVA OD #1 (10.21.20)  - FA 8.19.19 shows leaking MA, patches of capillary nonperfusion OU  - pt was lost to f/u from 02.19.2020 to 10.21.20  - discussed the importance of compliance with medical therapy and follow ups  - presented acutely (10.21.2020) for decreased vision and new onset floaters OD  - today, BCVA CF@4 ' OD; OS 20/40-2  - exam shows persistent VH, preretinal and subhyaloid hemorrhage, persistent preretinal fibrosis OD   - OCT shows persistent pre retinal fibrosis, scattered subhyaloid, vitreous heme and interval increase in vitreous opacities, OD; OS with stable diffuse atrophy  - recommend IVA OU today (OD #2 and OS #1) today, 12.02.20  - pt wishes to proceed  - RBA of procedure discussed, questions answered  - informed consent obtained and signed  - see procedure [name]  - VH precautions reviewed -- minimize activities, keep head elevated, avoid ASA/NSAIDs/blood thinners as able  - discussed possibility of surgery being needed in the future to stabilize eye and prevent further progression of diabetic eye disease  - f/u 2-3 weeks -- DFE/OCT/ f/u VH OD  3-5. 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 Hinton [name] in mid August 2019 with a 2 wk history intractable headaches and decreased vision OU  - found to have BP of 170s/90s -- sent to ED and was admitted to [facility] where SBP was up to 200  - discussed importance of tight BP control  - pt reports improved compliance with BP medications and improvement in BP  6. Acute CVA secondary to uncontrolled HTN  - MRI on 8.14.19:  IMPRESSION:   1. Limited 2 sequence MRI head: Acute RIGHT periatrial subcentimeter nonhemorrhagic infarct, in  region of optic tract.  - could be limiting vision -- will check formal visual field once macular edema improves    Ophthalmic Meds Ordered this visit:  Meds ordered this encounter  Medications  . Bevacizumab (AVASTIN) SOLN 1.25 mg  . Bevacizumab (AVASTIN) SOLN 1.25 mg       Return for f/u 2-3 weeks, PDR OU, DFE, OCT.  There are no Patient Instructions on file for this visit.   Explained the diagnoses, plan, and follow up with the patient and they expressed understanding.  Patient expressed understanding of the importance of proper follow up care.   This document serves as a record of services personally performed by Gardiner Sleeper, MD, PhD. It was created on their behalf by Roselee Nova, COMT. The creation of this record is the provider's dictation and/or activities during the visit.  Electronically signed by: Roselee Nova, COMT 05/15/19 9:53 PM  Gardiner Sleeper, M.D., Ph.D. Diseases & Surgery of the Retina and [place] 05/15/2019   I have reviewed the above documentation for accuracy and completeness, and I agree with the above. Gardiner Sleeper, M.D., Ph.D. 05/15/19 9:53 PM   Abbreviations: M myopia (nearsighted); A astigmatism; H hyperopia (farsighted); P presbyopia; Mrx spectacle prescription;  CTL contact lenses; OD right eye; OS left eye; OU both eyes  XT exotropia; ET esotropia; PEK punctate epithelial keratitis; PEE punctate epithelial erosions; DES dry eye syndrome; MGD meibomian gland dysfunction; ATs artificial tears;  PFAT's preservative free artificial tears; [place] nuclear sclerotic cataract; PSC posterior subcapsular cataract; ERM epi-retinal membrane; PVD posterior vitreous detachment; RD retinal detachment; DM diabetes mellitus; DR diabetic retinopathy; NPDR non-proliferative diabetic retinopathy; PDR proliferative diabetic retinopathy; CSME clinically significant macular edema; DME diabetic macular edema; dbh dot blot hemorrhages;  CWS cotton wool spot; POAG primary open angle glaucoma; C/D cup-to-disc ratio; HVF humphrey visual field; GVF goldmann visual field; OCT optical coherence tomography; IOP intraocular pressure; BRVO Branch retinal vein occlusion; CRVO central retinal vein occlusion; CRAO central retinal artery occlusion; BRAO branch retinal artery occlusion; RT retinal tear; SB scleral buckle; PPV pars plana vitrectomy; VH Vitreous hemorrhage; PRP panretinal laser photocoagulation; IVK intravitreal kenalog; VMT vitreomacular traction; MH Macular hole;  NVD neovascularization of the disc; NVE neovascularization elsewhere; AREDS age related eye disease study; ARMD age related macular degeneration; POAG primary open angle glaucoma; EBMD epithelial/anterior basement membrane dystrophy; ACIOL anterior chamber intraocular lens; IOL intraocular lens; PCIOL posterior chamber intraocular lens; Phaco/IOL phacoemulsification with intraocular lens placement; [place] photorefractive keratectomy; LASIK laser assisted in situ keratomileusis; HTN hypertension; DM diabetes mellitus; COPD chronic obstructive pulmonary disease

## 2019-05-15 ENCOUNTER — Encounter (INDEPENDENT_AMBULATORY_CARE_PROVIDER_SITE_OTHER): Payer: Self-pay | Admitting: Ophthalmology

## 2019-05-15 ENCOUNTER — Ambulatory Visit (INDEPENDENT_AMBULATORY_CARE_PROVIDER_SITE_OTHER): Payer: Self-pay | Admitting: Ophthalmology

## 2019-05-15 VITALS — BP 170/94 | HR 100

## 2019-05-15 DIAGNOSIS — H35033 Hypertensive retinopathy, bilateral: Secondary | ICD-10-CM

## 2019-05-15 DIAGNOSIS — I10 Essential (primary) hypertension: Secondary | ICD-10-CM

## 2019-05-15 DIAGNOSIS — Z9119 Patient's noncompliance with other medical treatment and regimen: Secondary | ICD-10-CM

## 2019-05-15 DIAGNOSIS — H3581 Retinal edema: Secondary | ICD-10-CM

## 2019-05-15 DIAGNOSIS — E113513 Type 2 diabetes mellitus with proliferative diabetic retinopathy with macular edema, bilateral: Secondary | ICD-10-CM

## 2019-05-15 DIAGNOSIS — Z91199 Patient's noncompliance with other medical treatment and regimen due to unspecified reason: Secondary | ICD-10-CM

## 2019-05-15 DIAGNOSIS — I639 Cerebral infarction, unspecified: Secondary | ICD-10-CM

## 2019-05-15 MED ORDER — BEVACIZUMAB CHEMO INJECTION 1.25MG/0.05ML SYRINGE FOR KALEIDOSCOPE
1.2500 mg | INTRAVITREAL | Status: AC | PRN
  Administered 2019-05-15: 1.25 mg via INTRAVITREAL

## 2019-05-17 ENCOUNTER — Encounter: Payer: Self-pay | Admitting: Internal Medicine

## 2019-05-17 ENCOUNTER — Other Ambulatory Visit: Payer: Self-pay

## 2019-05-17 ENCOUNTER — Ambulatory Visit (INDEPENDENT_AMBULATORY_CARE_PROVIDER_SITE_OTHER): Payer: 59 | Admitting: Internal Medicine

## 2019-05-17 VITALS — BP 110/58 | HR 95 | Temp 97.3°F | Wt 175.9 lb

## 2019-05-17 DIAGNOSIS — E785 Hyperlipidemia, unspecified: Secondary | ICD-10-CM

## 2019-05-17 DIAGNOSIS — E1169 Type 2 diabetes mellitus with other specified complication: Secondary | ICD-10-CM

## 2019-05-17 DIAGNOSIS — Z23 Encounter for immunization: Secondary | ICD-10-CM | POA: Diagnosis not present

## 2019-05-17 DIAGNOSIS — E1159 Type 2 diabetes mellitus with other circulatory complications: Secondary | ICD-10-CM

## 2019-05-17 DIAGNOSIS — N5201 Erectile dysfunction due to arterial insufficiency: Secondary | ICD-10-CM

## 2019-05-17 DIAGNOSIS — I5032 Chronic diastolic (congestive) heart failure: Secondary | ICD-10-CM | POA: Diagnosis not present

## 2019-05-17 DIAGNOSIS — I10 Essential (primary) hypertension: Secondary | ICD-10-CM | POA: Diagnosis not present

## 2019-05-17 DIAGNOSIS — F411 Generalized anxiety disorder: Secondary | ICD-10-CM

## 2019-05-17 LAB — POCT GLYCOSYLATED HEMOGLOBIN (HGB A1C): Hemoglobin A1C: 5.6 % (ref 4.0–5.6)

## 2019-05-17 MED ORDER — ATORVASTATIN CALCIUM 80 MG PO TABS: 80.0000 mg | ORAL_TABLET | Freq: Every evening | ORAL | 1 refills | Status: DC

## 2019-05-17 MED ORDER — SERTRALINE HCL 50 MG PO TABS: 50.0000 mg | ORAL_TABLET | Freq: Every day | ORAL | 1 refills | Status: DC

## 2019-05-17 NOTE — Patient Instructions (Signed)
-  Nice seeing you today!!  -Tetanus vaccine today.  -Start lipitor 80 mg daily for cholesterol.  -Start Zoloft 50 mg daily for anxiety.  -Referral for urology has bene placed.  -Schedule follow up in 3 months for your physical. Please come in fasting.

## 2019-05-17 NOTE — Progress Notes (Signed)
Established Patient Office Visit     This visit occurred during the SARS-CoV-2 public health emergency.  Safety protocols were in place, including screening questions prior to the visit, additional usage of staff PPE, and extensive cleaning of exam room while observing appropriate contact time as indicated for disinfecting solutions.    CC/Reason for Visit: Follow-up chronic medical conditions  HPI: Bill [name] is a 38 y.o. male who is coming in today for the above mentioned reasons. Past Medical History is significant for: End-stage renal disease on hemodialysis on a Tuesday, Thursday, Saturday schedule, type 2 diabetes with significant retinopathy currently receiving injections by Dr. Coralyn Pear, prior history of nonhemorrhagic CVA, history of well-controlled hypertension with hypotensive events during dialysis per report, history of heart failure with preserved ejection fraction.  He is concerned because he ran out of his glipizide in September and has not been on any diabetic medications since.  He is also very concerned about anxiety.  He is overwhelmed with starting dialysis and all of his acute medical issues including the heart failure stroke and the retinopathy.  He constantly feels like he is fidgety, he gets short of breath and palpitations during dialysis because of this.  He sometimes has to "talk himself out of not going to dialysis".  He is also concerned about significant erectile dysfunction and is wondering what he can do about this.  It is almost impossible for him to obtain an erection.   Past Medical/Surgical History: Past Medical History:  Diagnosis Date  . Anemia   . Anxiety    situational  . Diabetes mellitus without complication ([place])   . Diabetic retinopathy ([place])    PDR OU  . ESRD on dialysis [facility])    Dialyzing T-Th-S  . History of blood transfusion   . Hypertension   . PONV (postoperative nausea and vomiting)    03/22/2019- only 15 years ago  . Renal  disorder   . Stroke [facility]) 2019   vision    Past Surgical History:  Procedure Laterality Date  . A/V FISTULAGRAM N/A 03/11/2019   Procedure: A/V FISTULAGRAM - Left Arm;  Surgeon: Waynetta Sandy, MD;  Location: [place] [facility];  Service: Cardiovascular;  Laterality: N/A;  . arm surgery Right    Fracture  . AV FISTULA PLACEMENT Left 01/25/2019   Procedure: ARTERIOVENOUS (AV) FISTULA CREATION  LEFT UPPER  ARM;  Surgeon: Serafina Mitchell, MD;  Location: [place];  Service: Vascular;  Laterality: Left;  . BASCILIC VEIN TRANSPOSITION Left 03/27/2019   Procedure: SECOND STAGE BASILIC VEIN TRANSPOSITION LEFT ARM;  Surgeon: Serafina Mitchell, MD;  Location: [place];  Service: Vascular;  Laterality: Left;  . WISDOM TOOTH EXTRACTION      Social History:  reports that he has never smoked. He has never used smokeless tobacco. He reports previous alcohol use. He reports previous drug use.  Allergies: Allergies  Allergen Reactions  . Food     Melons-itchy/headaches    Family History:  Family History  Problem Relation Age of Onset  . Diabetes Mellitus II Mother   . Hypertension Mother   . Cataracts Mother   . Diabetes Mellitus II Father   . Stroke Father   . Hypertension Father   . Glaucoma Maternal Grandmother   . Amblyopia Neg Hx   . Blindness Neg Hx   . Macular degeneration Neg Hx   . Retinal detachment Neg Hx   . Strabismus Neg Hx   . Retinitis pigmentosa Neg  Hx      Current Outpatient Medications:  .  aspirin (BAYER ASPIRIN EC LOW DOSE) 81 MG EC tablet, Take 81 mg by mouth as needed., Disp: , Rfl:  .  atorvastatin (LIPITOR) 80 MG tablet, Take 1 tablet (80 mg total) by mouth every evening., Disp: 90 tablet, Rfl: 1 .  AURYXIA 1 GM 210 MG(Fe) tablet, Take 210 mg by mouth 3 (three) times daily with meals., Disp: , Rfl:  .  ferric citrate (AURYXIA) 1 GM 210 MG(Fe) tablet, Take 210 mg by mouth as directed., Disp: , Rfl:  .  fluticasone (FLONASE) 50 MCG/ACT nasal spray, Place 1  spray into both nostrils daily as needed for allergies or rhinitis., Disp: , Rfl:  .  multivitamin (RENA-VIT) TABS tablet, Take 1 tablet by mouth daily., Disp: , Rfl:  .  sertraline (ZOLOFT) 50 MG tablet, Take 1 tablet (50 mg total) by mouth daily., Disp: 30 tablet, Rfl: 1  Review of Systems:  Constitutional: Denies fever, chills, diaphoresis, appetite change and fatigue.  HEENT: Denies photophobia, eye pain, redness, hearing loss, ear pain, congestion, sore throat, rhinorrhea, sneezing, mouth sores, trouble swallowing, neck pain, neck stiffness and tinnitus.   Respiratory: Denies SOB, DOE, cough, chest tightness,  and wheezing.   Cardiovascular: Denies chest pain, palpitations and leg swelling.  Gastrointestinal: Denies nausea, vomiting, abdominal pain, diarrhea, constipation, blood in stool and abdominal distention.  Genitourinary: Denies dysuria, urgency, frequency, hematuria, flank pain and difficulty urinating.  Endocrine: Denies: hot or cold intolerance, sweats, changes in hair or nails, polyuria, polydipsia. Musculoskeletal: Denies myalgias, back pain, joint swelling, arthralgias and gait problem.  Skin: Denies pallor, rash and wound.  Neurological: Denies dizziness, seizures, syncope, weakness, light-headedness, numbness and headaches.  Hematological: Denies adenopathy. Easy bruising, personal or family bleeding history  Psychiatric/Behavioral: Denies suicidal ideation positive for mood changes, nervousness and sleep disturbance.   Physical Exam: Vitals:   05/17/19 0744  BP: (!) 110/58  Pulse: 95  Temp: (!) 97.3 F (36.3 C)  TempSrc: Temporal  SpO2: 99%  Weight: 175 lb 14.4 oz (79.8 kg)    Body mass index is 24.53 kg/m.   Constitutional: NAD, calm, comfortable Eyes: PERRL, lids and conjunctivae normal ENMT: Mucous membranes are moist.  Respiratory: clear to auscultation bilaterally, no wheezing, no crackles. Normal respiratory effort. No accessory muscle use.   Cardiovascular: Regular rate and rhythm, no murmurs / rubs / gallops. No extremity edema. 2+ pedal pulses.   Abdomen: no tenderness, no masses palpated. No hepatosplenomegaly. Bowel sounds positive.  Musculoskeletal: no clubbing / cyanosis. No joint deformity upper and lower extremities. Good ROM, no contractures. Normal muscle tone.  Skin: no rashes, lesions, ulcers. No induration Neurologic: Grossly intact and nonfocal.  Psychiatric: Normal judgment and insight. Alert and oriented x 3. Normal mood.    Impression and Plan:  Type 2 diabetes mellitus with vascular disease ([place])  -Extremely well controlled with A1c of 5.6.  He has not been on glipizide since September and that has been 2-1/2 months. -I think it is okay to hold off on all diabetic medication for now and monitor with repeat A1c in 3 months.  Hyperlipidemia associated with type 2 diabetes mellitus ([place]) -LDL was 94 in April 2020, goal is less than 70. -He has not been taking any statins as of September. -Will resume Lipitor 80 mg.  Chronic heart failure with preserved ejection fraction ([place]) -Echo in April 2020 with ejection fraction of 55 to 60% with increased left ventricular wall thickness. -This is  compensated at present.  Unable to start beta-blocker or ACE inhibitor due to hypotension.  Essential hypertension -History of now with hypotension since starting dialysis, off all medications.  GAD (generalized anxiety disorder)  -Start Zoloft 50 mg daily. -Follow-up in the next 3 months.  Erectile dysfunction due to arterial insufficiency  -He is a definite vasculopath. - Plan: Ambulatory referral to Urology    Patient Instructions  -Nice seeing you today!!  -Tetanus vaccine today.  -Start lipitor 80 mg daily for cholesterol.  -Start Zoloft 50 mg daily for anxiety.  -Referral for urology has bene placed.  -Schedule follow up in 3 months for your physical. Please come in fasting.     Lelon Frohlich, MD [facility]

## 2019-05-28 NOTE — Progress Notes (Signed)
[facility] Note  05/29/2019     CHIEF COMPLAINT Patient presents for Retina Follow Up   HISTORY OF PRESENT ILLNESS: Bill [name] is a 38 y.o. male who presents to the clinic today for:   HPI    Retina Follow Up    Patient presents with  Diabetic Retinopathy.  In both eyes.  This started weeks ago.  Severity is moderate.  Duration of weeks.  Since onset it is stable.  I, the attending physician,  performed the HPI with the patient and updated documentation appropriately.          Comments    Pt states his vision is the same OU.  Patient denies eye pain or discomfort.  Patient denies any new or worsening floaters or fol OU.       Last edited by [name], MD on 05/30/2019  8:46 AM. (History)    pt states his right eye may be slightly improved in his right eye today since he received an injection, pt states they have started using the fistula in his arm for dialysis  Referring physician: Isaac Bliss, Rayford Halsted, MD [address],  [place] [address]  HISTORICAL INFORMATION:   Selected notes from the MEDICAL RECORD NUMBER Referred by Dr. [name] for concern of severe NPDR / PDR OU; LEE-  Ocular Hx-  PMH-     CURRENT MEDICATIONS: Current Outpatient Medications (Ophthalmic Drugs)  Medication Sig  . dorzolamide-timolol (COSOPT) 22.3-6.8 MG/ML ophthalmic solution Place 1 drop into the right eye 2 (two) times daily.   No current facility-administered medications for this visit. (Ophthalmic Drugs)   Current Outpatient Medications (Other)  Medication Sig  . aspirin (BAYER ASPIRIN EC LOW DOSE) 81 MG EC tablet Take 81 mg by mouth as needed.  Marland Kitchen atorvastatin (LIPITOR) 80 MG tablet Take 1 tablet (80 mg total) by mouth every evening.  [name] 1 GM 210 MG(Fe) tablet Take 210 mg by mouth 3 (three) times daily with meals.  . ferric citrate (AURYXIA) 1 GM 210 MG(Fe) tablet Take 210 mg by mouth as directed.  . fluticasone  (FLONASE) 50 MCG/ACT nasal spray Place 1 spray into both nostrils daily as needed for allergies or rhinitis.  . multivitamin (RENA-VIT) TABS tablet Take 1 tablet by mouth daily.  . sertraline (ZOLOFT) 50 MG tablet Take 1 tablet (50 mg total) by mouth daily.   No current facility-administered medications for this visit. (Other)      REVIEW OF SYSTEMS: ROS    Positive for: Genitourinary, Endocrine, Eyes   Negative for: Constitutional, Gastrointestinal, Neurological, Skin, Musculoskeletal, HENT, Cardiovascular, Respiratory, Psychiatric, Allergic/Imm, Heme/Lymph   Last edited by Doneen Poisson on 05/29/2019  2:03 PM. (History)       ALLERGIES Allergies  Allergen Reactions  . Food     Melons-itchy/headaches    PAST MEDICAL HISTORY Past Medical History:  Diagnosis Date  . Anemia   . Anxiety    situational  . Diabetes mellitus without complication ([place])   . Diabetic retinopathy ([place])    PDR OU  . ESRD on dialysis [facility])    Dialyzing T-Th-S  . History of blood transfusion   . Hypertension   . PONV (postoperative nausea and vomiting)    03/22/2019- only 15 years ago  . Renal disorder   . Stroke [facility]) 2019   vision   Past Surgical History:  Procedure Laterality Date  . A/V FISTULAGRAM N/A 03/11/2019   Procedure:  A/V FISTULAGRAM - Left Arm;  Surgeon: Waynetta Sandy, MD;  Location: [facility];  Service: Cardiovascular;  Laterality: N/A;  . arm surgery Right    Fracture  . AV FISTULA PLACEMENT Left 01/25/2019   Procedure: ARTERIOVENOUS (AV) FISTULA CREATION  LEFT UPPER  ARM;  Surgeon: Serafina Mitchell, MD;  Location: [facility];  Service: Vascular;  Laterality: Left;  . BASCILIC VEIN TRANSPOSITION Left 03/27/2019   Procedure: SECOND STAGE BASILIC VEIN TRANSPOSITION LEFT ARM;  Surgeon: Serafina Mitchell, MD;  Location: [facility];  Service: Vascular;  Laterality: Left;  . WISDOM TOOTH EXTRACTION      FAMILY HISTORY Family History  Problem Relation Age of Onset  .  Diabetes Mellitus II Mother   . Hypertension Mother   . Cataracts Mother   . Diabetes Mellitus II Father   . Stroke Father   . Hypertension Father   . Glaucoma Maternal Grandmother   . Amblyopia Neg Hx   . Blindness Neg Hx   . Macular degeneration Neg Hx   . Retinal detachment Neg Hx   . Strabismus Neg Hx   . Retinitis pigmentosa Neg Hx     SOCIAL HISTORY Social History   Tobacco Use  . Smoking status: Never Smoker  . Smokeless tobacco: Never Used  Substance Use Topics  . Alcohol use: Not Currently  . Drug use: Not Currently         OPHTHALMIC EXAM:  Base Eye Exam    Visual Acuity (Snellen - Linear)      Right Left   Dist [place] 20/CF 20/200 -2   Dist ph [place] 20/400 20/40 -2       Tonometry (Tonopen, 2:11 PM)      Right Left   Pressure 22 45       Tonometry #2 (Tonopen, 3:10 PM)      Right Left   Pressure 19 26       Tonometry Comments   TP OS: 45,45 TA OS: 45  1 gtt brimonidine and cosopt OU, given at 2:11       Gonioscopy      Right Left   Temporal  Grade 4   Nasal  Grade 4   Superior  Grade 4   Inferior  Grade 4  No NVA OS       Pupils      Dark Light Shape React APD   Right 4 3 Round Brisk 0   Left 4 3 Round Brisk 0       Visual Fields      Left Right   Restrictions Partial outer superior temporal deficiency Partial outer inferior temporal, superior nasal deficiencies       Extraocular Movement      Right Left    Full Full       Neuro/Psych    Oriented x3: Yes   Mood/Affect: Normal       Dilation    Both eyes: 1.0% Mydriacyl, 2.5% Phenylephrine @ 2:12 PM        Slit Lamp and Fundus Exam    Slit Lamp Exam      Right Left   Lids/Lashes Normal Normal   Conjunctiva/Sclera White and quiet White and quiet   Cornea Clear Clear   Anterior Chamber Deep and quiet Deep and quiet   Iris Round and dilated, No NVI Round and dilated, no NVI   Lens 1+ Nuclear sclerosis, 1+ Cortical cataract, trace Posterior subcapsular cataract 1+  Nuclear sclerosis, 1+  Cortical cataract, trace Posterior subcapsular cataract   Vitreous diffuse subhyaloid heme temporal hemisphere obscuring view; Old white VH settled inferiorly, blood clots inferiorly obscuring view Trace, old white VH settled inferiorly       Fundus Exam      Right Left   Disc Pale, sharp rim, Fine NVD, +fibrosis Mild pallor, sharp rim, +fibrosis; +fine NVD   C/D Ratio 0.1 0.2   Macula obscured by blood stained vitreous and subhyaloid heme Flat, Blunted foveal reflex, sclerotic arterioiles, scattered focal exudate, diffuse atrophy, flat choroidal nevus superior macula   Vessels Vascular attenuation, Tortuous, +fibrotic NVE along ST arcades Sclerotic arterioles, Vascular attenuation, Tortuous   Periphery Attached, nasal retina clear, large pre-retinal heme nasal to disc, temporal hemisphere obscured by vitreous heme, 360 PRP Attached; scattered IRH; good 360 PRP in place          IMAGING AND PROCEDURES  Imaging and Procedures for @TODAY @  OCT, Retina - OU - Both Eyes       Right Eye Quality was good. Central Foveal Thickness: 433. Progression has worsened. Findings include abnormal foveal contour, intraretinal fluid, intraretinal hyper-reflective material, subretinal fluid, preretinal fibrosis, vitreous traction, epiretinal membrane (Persistent pre retinal fibrosis, scattered subhyaloid and vitreous heme, interval increase in vitreous opacities).   Left Eye Quality was good. Central Foveal Thickness: 235. Progression has been stable. Findings include abnormal foveal contour, intraretinal hyper-reflective material, no SRF, inner retinal atrophy, epiretinal membrane, no IRF, preretinal fibrosis, outer retinal atrophy (Diffuse atrophy, stable from prior).   Notes *Images captured and stored on drive  Diagnosis / Impression:  OD: Persistent pre-retinal fibrosis, scattered subhyaloid and vitreous heme, interval increase in vitreous opacities OS: Diffuse atrophy,  stable from prior    Clinical management:  See below  Abbreviations: NFP - Normal foveal profile. CME - cystoid macular edema. PED - pigment epithelial detachment. IRF - intraretinal fluid. SRF - subretinal fluid. EZ - ellipsoid zone. ERM - epiretinal membrane. ORA - outer retinal atrophy. ORT - outer retinal tubulation. SRHM - subretinal hyper-reflective material                  ASSESSMENT/PLAN:   ICD-10-CM   1. Proliferative diabetic retinopathy of both eyes with macular edema associated with type 2 diabetes mellitus ([place])  [id number]   2. History of noncompliance with medical treatment  Z91.19   3. Hypertensive retinopathy of both eyes  H35.033   4. Essential hypertension  I10   5. Retinal edema  H35.81 OCT, Retina - OU - Both Eyes  6. Acute CVA (cerebrovascular accident) ([place])  I63.9   7. Ocular hypertension, bilateral  H40.053      1,2. Proliferative diabetic retinopathy OU  - s/p PRP OD (10.02.19), fill-in (01.22.20), fill in OD (02.12.20)   - s/p PRP OS (05/02/2018), fill-in (02.19.20)  - s/p IVK #1 OD (01.22.20), OS (02.12.20)  - s/p IVA OD #1 (10.21.20), #2 (12.02.20)  - s/p IVA OS #1 (12.02.20)  - FA 8.19.19 shows leaking MA, patches of capillary nonperfusion OU  - pt was lost to f/u from 02.19.2020 to 10.21.20  - discussed the importance of compliance with medical therapy and follow ups  - presented acutely (10.21.2020) for decreased vision and new onset floaters OD  - today, BCVA 20/400 OD; OS 20/40-2  - exam shows persistent VH, preretinal and subhyaloid hemorrhage, persistent preretinal fibrosis OD   - OCT shows persistent pre retinal fibrosis, scattered subhyaloid, vitreous heme and interval increase in vitreous opacities, OD; OS with  stable diffuse atrophy   - VH precautions reviewed -- minimize activities, keep head elevated, avoid ASA/NSAIDs/blood thinners as able  - discussed possibility of surgery being needed in the future to stabilize eye and prevent  further progression of diabetic eye disease  - f/u week of Jan. 4 -- DFE/OCT/ f/u VH OD  3-5. 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 [name] in mid August 2019 with a 2 wk history intractable headaches and decreased vision OU  - found to have BP of 170s/90s -- sent to ED and was admitted to [facility] where SBP was up to 200  - discussed importance of tight BP control  - pt reports improved compliance with BP medications and improvement in BP  6. Acute CVA secondary to uncontrolled HTN  - MRI on 8.14.19:  IMPRESSION:   1. Limited 2 sequence MRI head: Acute RIGHT periatrial subcentimeter nonhemorrhagic infarct, in region of optic tract.  - could be limiting vision -- will check formal visual field once macular edema improves  7. Ocular Hypertention OU  - OD 22; OS 45   - given 1 drop brim, 1 drop Cosopt in office, came down to 19 OD and 26 OS  - start Cosopt BID OU -- bottle given in clinic    Ophthalmic Meds Ordered this visit:  Meds ordered this encounter  Medications  . dorzolamide-timolol (COSOPT) 22.3-6.8 MG/ML ophthalmic solution    Sig: Place 1 drop into the right eye 2 (two) times daily.    Dispense:  10 mL    Refill:  1       Return for week of Jan 4, PDR OU, DFE, OCT.  There are no Patient Instructions on file for this visit.   Explained the diagnoses, plan, and follow up with the patient and they expressed understanding.  Patient expressed understanding of the importance of proper follow up care.   This document serves as a record of services personally performed by Gardiner Sleeper, MD, PhD. It was created on their behalf by Roselee Nova, COMT. The creation of this record is the provider's dictation and/or activities during the visit.  Electronically signed by: Roselee Nova, COMT 05/30/19 8:50 AM  Gardiner Sleeper, M.D., Ph.D. Diseases & Surgery of the Retina and [place] 05/29/2019    I have reviewed the above documentation for accuracy and completeness, and I agree with the above. Gardiner Sleeper, M.D., Ph.D. 05/30/19 8:50 AM    Abbreviations: M myopia (nearsighted); A astigmatism; H hyperopia (farsighted); P presbyopia; Mrx spectacle prescription;  CTL contact lenses; OD right eye; OS left eye; OU both eyes  XT exotropia; ET esotropia; PEK punctate epithelial keratitis; PEE punctate epithelial erosions; DES dry eye syndrome; MGD meibomian gland dysfunction; ATs artificial tears; PFAT's preservative free artificial tears; [place] nuclear sclerotic cataract; PSC posterior subcapsular cataract; ERM epi-retinal membrane; PVD posterior vitreous detachment; RD retinal detachment; DM diabetes mellitus; DR diabetic retinopathy; NPDR non-proliferative diabetic retinopathy; PDR proliferative diabetic retinopathy; CSME clinically significant macular edema; DME diabetic macular edema; dbh dot blot hemorrhages; CWS cotton wool spot; POAG primary open angle glaucoma; C/D cup-to-disc ratio; HVF humphrey visual field; GVF goldmann visual field; OCT optical coherence tomography; IOP intraocular pressure; BRVO Branch retinal vein occlusion; CRVO central retinal vein occlusion; CRAO central retinal artery occlusion; BRAO branch retinal artery occlusion; RT retinal tear; SB scleral buckle; PPV pars plana vitrectomy; VH Vitreous hemorrhage; PRP panretinal laser photocoagulation; IVK intravitreal kenalog;  VMT vitreomacular traction; MH Macular hole;  NVD neovascularization of the disc; NVE neovascularization elsewhere; AREDS age related eye disease study; ARMD age related macular degeneration; POAG primary open angle glaucoma; EBMD epithelial/anterior basement membrane dystrophy; ACIOL anterior chamber intraocular lens; IOL intraocular lens; PCIOL posterior chamber intraocular lens; Phaco/IOL phacoemulsification with intraocular lens placement; [place] photorefractive keratectomy; LASIK laser assisted in situ  keratomileusis; HTN hypertension; DM diabetes mellitus; COPD chronic obstructive pulmonary disease

## 2019-05-29 ENCOUNTER — Other Ambulatory Visit: Payer: Self-pay

## 2019-05-29 ENCOUNTER — Encounter (INDEPENDENT_AMBULATORY_CARE_PROVIDER_SITE_OTHER): Payer: Self-pay | Admitting: Ophthalmology

## 2019-05-29 ENCOUNTER — Ambulatory Visit (INDEPENDENT_AMBULATORY_CARE_PROVIDER_SITE_OTHER): Payer: 59 | Admitting: Ophthalmology

## 2019-05-29 VITALS — BP 153/84 | HR 85

## 2019-05-29 DIAGNOSIS — H40053 Ocular hypertension, bilateral: Secondary | ICD-10-CM

## 2019-05-29 DIAGNOSIS — E113513 Type 2 diabetes mellitus with proliferative diabetic retinopathy with macular edema, bilateral: Secondary | ICD-10-CM

## 2019-05-29 DIAGNOSIS — H35033 Hypertensive retinopathy, bilateral: Secondary | ICD-10-CM

## 2019-05-29 DIAGNOSIS — Z9119 Patient's noncompliance with other medical treatment and regimen: Secondary | ICD-10-CM

## 2019-05-29 DIAGNOSIS — I10 Essential (primary) hypertension: Secondary | ICD-10-CM

## 2019-05-29 DIAGNOSIS — Z91199 Patient's noncompliance with other medical treatment and regimen due to unspecified reason: Secondary | ICD-10-CM

## 2019-05-29 DIAGNOSIS — H3581 Retinal edema: Secondary | ICD-10-CM | POA: Diagnosis not present

## 2019-05-29 DIAGNOSIS — I639 Cerebral infarction, unspecified: Secondary | ICD-10-CM

## 2019-05-29 MED ORDER — DORZOLAMIDE HCL-TIMOLOL MAL 2-0.5 % OP SOLN: 1.0000 [drp] | Freq: Two times a day (BID) | OPHTHALMIC | 1 refills | Status: DC

## 2019-05-30 ENCOUNTER — Encounter (INDEPENDENT_AMBULATORY_CARE_PROVIDER_SITE_OTHER): Payer: Self-pay | Admitting: Ophthalmology

## 2019-06-19 ENCOUNTER — Encounter (INDEPENDENT_AMBULATORY_CARE_PROVIDER_SITE_OTHER): Payer: 59 | Admitting: Ophthalmology

## 2019-06-24 NOTE — Progress Notes (Signed)
[facility] Note  06/26/2019     CHIEF COMPLAINT Patient presents for Retina Follow Up   HISTORY OF PRESENT ILLNESS: Bill [name] is a 39 y.o. male who presents to the clinic today for:   HPI    Retina Follow Up    Patient presents with  Diabetic Retinopathy.  In both eyes.  This started 5 weeks ago.  Severity is moderate.  I, the attending physician,  performed the HPI with the patient and updated documentation appropriately.          Comments    Patient here for 5 weeks retina follow up for PDR OU. Patient states vision about the same. No eye pain.       Last edited by [name], MD on 06/26/2019  1:39 PM. (History)    pt states he feels like his vision is getting better, but it is very slow moving, pt states they took the port out of his chest and is using the one in his arm now, he states they want to give him heparin, but he wanted to be seen at this appt first to make sure it was okay   Referring physician: Isaac Bliss, Rayford Halsted, MD [address],  [place] [address]  HISTORICAL INFORMATION:   Selected notes from the [facility] Referred by Dr. [name] for concern of severe NPDR / PDR OU; LEE-  Ocular Hx-  PMH-     CURRENT MEDICATIONS: Current Outpatient Medications (Ophthalmic Drugs)  Medication Sig  . dorzolamide-timolol (COSOPT) 22.3-6.8 MG/ML ophthalmic solution Place 1 drop into the right eye 2 (two) times daily.   No current facility-administered medications for this visit. (Ophthalmic Drugs)   Current Outpatient Medications (Other)  Medication Sig  . aspirin (BAYER ASPIRIN EC LOW DOSE) 81 MG EC tablet Take 81 mg by mouth as needed.  Marland Kitchen atorvastatin (LIPITOR) 80 MG tablet Take 1 tablet (80 mg total) by mouth every evening.  [name] 1 GM 210 MG(Fe) tablet Take 210 mg by mouth 3 (three) times daily with meals.  . ferric citrate (AURYXIA) 1 GM 210 MG(Fe) tablet Take 210 mg by mouth as  directed.  . fluticasone (FLONASE) 50 MCG/ACT nasal spray Place 1 spray into both nostrils daily as needed for allergies or rhinitis.  . multivitamin (RENA-VIT) TABS tablet Take 1 tablet by mouth daily.  . sertraline (ZOLOFT) 50 MG tablet Take 1 tablet (50 mg total) by mouth daily.   No current facility-administered medications for this visit. (Other)      REVIEW OF SYSTEMS: ROS    Positive for: Genitourinary, Endocrine, Eyes   Negative for: Constitutional, Gastrointestinal, Neurological, Skin, Musculoskeletal, HENT, Cardiovascular, Respiratory, Psychiatric, Allergic/Imm, Heme/Lymph   Last edited by [name], COA on 06/26/2019  9:43 AM. (History)       ALLERGIES Allergies  Allergen Reactions  . Food     Melons-itchy/headaches    PAST MEDICAL HISTORY Past Medical History:  Diagnosis Date  . Anemia   . Anxiety    situational  . Diabetes mellitus without complication ([place])   . Diabetic retinopathy ([place])    PDR OU  . ESRD on dialysis [facility])    Dialyzing T-Th-S  . History of blood transfusion   . Hypertension   . PONV (postoperative nausea and vomiting)    03/22/2019- only 15 years ago  . Renal disorder   . Stroke [facility]) 2019   vision   Past Surgical  History:  Procedure Laterality Date  . A/V FISTULAGRAM N/A 03/11/2019   Procedure: A/V FISTULAGRAM - Left Arm;  Surgeon: Waynetta Sandy, MD;  Location: [facility];  Service: Cardiovascular;  Laterality: N/A;  . arm surgery Right    Fracture  . AV FISTULA PLACEMENT Left 01/25/2019   Procedure: ARTERIOVENOUS (AV) FISTULA CREATION  LEFT UPPER  ARM;  Surgeon: Serafina Mitchell, MD;  Location: [facility];  Service: Vascular;  Laterality: Left;  . BASCILIC VEIN TRANSPOSITION Left 03/27/2019   Procedure: SECOND STAGE BASILIC VEIN TRANSPOSITION LEFT ARM;  Surgeon: Serafina Mitchell, MD;  Location: [facility];  Service: Vascular;  Laterality: Left;  . WISDOM TOOTH EXTRACTION      FAMILY HISTORY Family History   Problem Relation Age of Onset  . Diabetes Mellitus II Mother   . Hypertension Mother   . Cataracts Mother   . Diabetes Mellitus II Father   . Stroke Father   . Hypertension Father   . Glaucoma Maternal Grandmother   . Amblyopia Neg Hx   . Blindness Neg Hx   . Macular degeneration Neg Hx   . Retinal detachment Neg Hx   . Strabismus Neg Hx   . Retinitis pigmentosa Neg Hx     SOCIAL HISTORY Social History   Tobacco Use  . Smoking status: Never Smoker  . Smokeless tobacco: Never Used  Substance Use Topics  . Alcohol use: Not Currently  . Drug use: Not Currently         OPHTHALMIC EXAM:  Base Eye Exam    Visual Acuity (Snellen - Linear)      Right Left   Dist [place] 20/200 -1 20/80 -1   Dist ph  20/200 +2 20/40 -1       Tonometry (Tonopen, 9:40 AM)      Right Left   Pressure 16 22       Pupils      Dark Light Shape React APD   Right 4 3 Round Brisk None   Left 4 3 Round Brisk None       Visual Fields (Counting fingers)      Left Right   Restrictions Partial outer superior temporal deficiency Partial outer inferior temporal, superior nasal deficiencies       Extraocular Movement      Right Left    Full Full       Neuro/Psych    Oriented x3: Yes   Mood/Affect: Normal       Dilation    Both eyes: 1.0% Mydriacyl, 2.5% Phenylephrine @ 9:39 AM        Slit Lamp and Fundus Exam    Slit Lamp Exam      Right Left   Lids/Lashes Normal Normal   Conjunctiva/Sclera White and quiet White and quiet   Cornea Clear Clear   Anterior Chamber Deep and quiet Deep and quiet   Iris Round and dilated, No NVI Round and dilated, no NVI   Lens 1+ Nuclear sclerosis, 1+ Cortical cataract, trace Posterior subcapsular cataract 1+ Nuclear sclerosis, 1+ Cortical cataract, trace Posterior subcapsular cataract   Vitreous Pre-retinal heme nasal to disc, diffuse subhyaloid heme temporal hemisphere; Old white VH settled inferiorly, blood clots inferiorly obscuring view Trace, old  white VH settled inferiorly       Fundus Exam      Right Left   Disc Trace Pallor, sharp rim, Fine NVD, +fibrosis Mild pallor, sharp rim, +fibrosis; +fine NVD   C/D Ratio 0.1 0.2  Macula Partially obscured by blood stained vitreous and subhyaloid heme -- improved Flat, Blunted foveal reflex, sclerotic arterioiles, scattered focal exudate, diffuse atrophy, flat choroidal nevus superior macula   Vessels Vascular attenuation, Tortuous, +fibrotic NVE along ST arcades Sclerotic arterioles, Vascular attenuation, Tortuous   Periphery Attached, nasal retina clear, large pre-retinal heme nasal to disc, temporal hemisphere obscured by vitreous heme, 360 PRP Attached; scattered IRH; good 360 PRP in place          IMAGING AND PROCEDURES  Imaging and Procedures for @TODAY @  OCT, Retina - OU - Both Eyes       Right Eye Quality was good. Central Foveal Thickness: 339. Progression has improved. Findings include abnormal foveal contour, intraretinal hyper-reflective material, preretinal fibrosis, vitreous traction, epiretinal membrane, no IRF, no SRF, macular pucker (Persistent pre retinal fibrosis, scattered subhyaloid and vitreous heme -- improved, interval improvement in vitreous opacities).   Left Eye Quality was good. Central Foveal Thickness: 235. Progression has been stable. Findings include abnormal foveal contour, intraretinal hyper-reflective material, no SRF, inner retinal atrophy, epiretinal membrane, no IRF, preretinal fibrosis, outer retinal atrophy, vitreomacular adhesion  (Diffuse atrophy, stable from prior).   Notes *Images captured and stored on drive  Diagnosis / Impression:  OD: Persistent pre retinal fibrosis, scattered subhyaloid and vitreous heme -- improved; interval improvement in vitreous opacities OS: Diffuse atrophy, stable from prior    Clinical management:  See below  Abbreviations: NFP - Normal foveal profile. CME - cystoid macular edema. PED - pigment epithelial  detachment. IRF - intraretinal fluid. SRF - subretinal fluid. EZ - ellipsoid zone. ERM - epiretinal membrane. ORA - outer retinal atrophy. ORT - outer retinal tubulation. SRHM - subretinal hyper-reflective material         Intravitreal Injection, Pharmacologic Agent - OD - Right Eye       Time Out 06/26/2019. 11:17 AM. Confirmed correct patient, procedure, site, and patient consented.   Anesthesia Topical anesthesia was used. Anesthetic medications included Lidocaine 2%, Proparacaine 0.5%.   Procedure Preparation included 5% betadine to ocular surface, eyelid speculum. A 30 gauge needle was used.   Injection:  1.25 mg Bevacizumab (AVASTIN) SOLN   NDC: [id number], Lot: [id number] , Expiration date: 09/11/2019   Route: Intravitreal, Site: Right Eye, Waste: 0 mL  Post-op Post injection exam found visual acuity of at least counting fingers. The patient tolerated the procedure well. There were no complications. The patient received written and verbal post procedure care education.                 ASSESSMENT/PLAN:   ICD-10-CM   1. Proliferative diabetic retinopathy of both eyes with macular edema associated with type 2 diabetes mellitus (HCC)  09/24/2019 Intravitreal Injection, Pharmacologic Agent - OD - Right Eye    Bevacizumab (AVASTIN) SOLN 1.25 mg  2. History of noncompliance with medical treatment  Z91.19   3. Hypertensive retinopathy of both eyes  H35.033   4. Essential hypertension  I10   5. Retinal edema  H35.81 OCT, Retina - OU - Both Eyes  6. Acute CVA (cerebrovascular accident) ([place])  I63.9   7. Ocular hypertension, bilateral  H40.053      1,2. Proliferative diabetic retinopathy OU  - s/p PRP OD (10.02.19), fill-in (01.22.20), fill in OD (02.12.20)   - s/p PRP OS (05/02/2018), fill-in (02.19.20)  - s/p IVK #1 OD (01.22.20), OS (02.12.20)  - s/p IVA OD #1 (10.21.20), #2 (12.02.20)  - s/p IVA OS #1 (12.02.20)  - FA 8.19.19 shows leaking MA,  patches of  capillary nonperfusion OU  - pt was lost to f/u from 02.19.2020 to 10.21.20  - discussed the importance of compliance with medical therapy and follow ups  - presented acutely (10.21.2020) for decreased vision and new onset floaters OD  - today, BCVA 20/200 OD (improved from 20/400); OS 20/40 (stable)  - exam shows improved VH, preretinal and subhyaloid hemorrhage, persistent preretinal fibrosis OD   - OCT shows persistent pre retinal fibrosis, scattered subhyaloid, vitreous heme and interval improvement in vitreous opacities, OD; OS with stable diffuse atrophy   - VH precautions reviewed -- minimize activities, keep head elevated, avoid ASA/NSAIDs/blood thinners as able  - specifically advised pt start heparin if needed for dialysis, despite possibility of increased bleeding OD  - discussed possibility of surgery being needed in the future to stabilize eye and prevent further progression of diabetic eye disease  - recommend IVA OD #3 today, 01.13.21  - pt wishes to proceed  - RBA of procedure discussed, questions answered  - informed consent obtained and signed  - see procedure note  - f/u 4 weeks -- DFE/OCT/ f/u VH OD  3-5. 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 [name] in mid August 2019 with a 2 wk history intractable headaches and decreased vision OU  - found to have BP of 170s/90s -- sent to ED and was [facility] to [facility] where SBP was up to 200  - discussed importance of tight BP control  - pt reports improved compliance with BP medications and improvement in BP  6. Acute CVA secondary to uncontrolled HTN  - MRI on 8.14.19:  IMPRESSION:   1. Limited 2 sequence MRI head: Acute RIGHT periatrial subcentimeter nonhemorrhagic infarct, in region of optic tract.  - could be limiting vision -- will check formal visual field once macular edema improves  7. Ocular Hypertention OU  - today, IOP OD 16, OS 22  - cont Cosopt BID OU -- bottle given  in clinic    Ophthalmic Meds Ordered this visit:  Meds ordered this encounter  Medications  . Bevacizumab (AVASTIN) SOLN 1.25 mg       Return in about 4 weeks (around 07/24/2019) for f/u PDR OU, DFE, OCT.  There are no Patient Instructions on file for this visit.   Explained the diagnoses, plan, and follow up with the patient and they expressed understanding.  Patient expressed understanding of the importance of proper follow up care.   This document serves as a record of services personally performed by Gardiner Sleeper, MD, PhD. It was created on their behalf by Roselee Nova, COMT. The creation of this record is the provider's dictation and/or activities during the visit.  Electronically signed by: Roselee Nova, COMT 06/26/19 1:45 PM  Gardiner Sleeper, M.D., Ph.D. Diseases & Surgery of the Retina and [place] 06/26/2019    I have reviewed the above documentation for accuracy and completeness, and I agree with the above. Gardiner Sleeper, M.D., Ph.D. 06/26/19 1:45 PM   Abbreviations: M myopia (nearsighted); A astigmatism; H hyperopia (farsighted); P presbyopia; Mrx spectacle prescription;  CTL contact lenses; OD right eye; OS left eye; OU both eyes  XT exotropia; ET esotropia; PEK punctate epithelial keratitis; PEE punctate epithelial erosions; DES dry eye syndrome; MGD meibomian gland dysfunction; ATs artificial tears; PFAT's preservative free artificial tears; [place] nuclear sclerotic cataract; PSC posterior subcapsular cataract; ERM epi-retinal membrane; PVD posterior vitreous detachment; RD retinal  detachment; DM diabetes mellitus; DR diabetic retinopathy; NPDR non-proliferative diabetic retinopathy; PDR proliferative diabetic retinopathy; CSME clinically significant macular edema; DME diabetic macular edema; dbh dot blot hemorrhages; CWS cotton wool spot; POAG primary open angle glaucoma; C/D cup-to-disc ratio; HVF humphrey visual field; GVF goldmann  visual field; OCT optical coherence tomography; IOP intraocular pressure; BRVO Branch retinal vein occlusion; CRVO central retinal vein occlusion; CRAO central retinal artery occlusion; BRAO branch retinal artery occlusion; RT retinal tear; SB scleral buckle; PPV pars plana vitrectomy; VH Vitreous hemorrhage; PRP panretinal laser photocoagulation; IVK intravitreal kenalog; VMT vitreomacular traction; MH Macular hole;  NVD neovascularization of the disc; NVE neovascularization elsewhere; AREDS age related eye disease study; ARMD age related macular degeneration; POAG primary open angle glaucoma; EBMD epithelial/anterior basement membrane dystrophy; ACIOL anterior chamber intraocular lens; IOL intraocular lens; PCIOL posterior chamber intraocular lens; Phaco/IOL phacoemulsification with intraocular lens placement; [place] photorefractive keratectomy; LASIK laser assisted in situ keratomileusis; HTN hypertension; DM diabetes mellitus; COPD chronic obstructive pulmonary disease

## 2019-06-26 ENCOUNTER — Encounter (INDEPENDENT_AMBULATORY_CARE_PROVIDER_SITE_OTHER): Payer: Self-pay | Admitting: Ophthalmology

## 2019-06-26 ENCOUNTER — Other Ambulatory Visit: Payer: Self-pay

## 2019-06-26 ENCOUNTER — Ambulatory Visit (INDEPENDENT_AMBULATORY_CARE_PROVIDER_SITE_OTHER): Payer: 59 | Admitting: Ophthalmology

## 2019-06-26 DIAGNOSIS — I639 Cerebral infarction, unspecified: Secondary | ICD-10-CM

## 2019-06-26 DIAGNOSIS — H35033 Hypertensive retinopathy, bilateral: Secondary | ICD-10-CM

## 2019-06-26 DIAGNOSIS — H40053 Ocular hypertension, bilateral: Secondary | ICD-10-CM

## 2019-06-26 DIAGNOSIS — Z9119 Patient's noncompliance with other medical treatment and regimen: Secondary | ICD-10-CM

## 2019-06-26 DIAGNOSIS — H3581 Retinal edema: Secondary | ICD-10-CM | POA: Diagnosis not present

## 2019-06-26 DIAGNOSIS — E113513 Type 2 diabetes mellitus with proliferative diabetic retinopathy with macular edema, bilateral: Secondary | ICD-10-CM | POA: Diagnosis not present

## 2019-06-26 DIAGNOSIS — Z91199 Patient's noncompliance with other medical treatment and regimen due to unspecified reason: Secondary | ICD-10-CM

## 2019-06-26 DIAGNOSIS — I10 Essential (primary) hypertension: Secondary | ICD-10-CM | POA: Diagnosis not present

## 2019-06-26 MED ORDER — BEVACIZUMAB CHEMO INJECTION 1.25MG/0.05ML SYRINGE FOR KALEIDOSCOPE
1.2500 mg | INTRAVITREAL | Status: AC | PRN
  Administered 2019-06-26: 1.25 mg via INTRAVITREAL

## 2019-07-18 NOTE — Progress Notes (Addendum)
[facility] Note  07/24/2019     CHIEF COMPLAINT Patient presents for Retina Follow Up   HISTORY OF PRESENT ILLNESS: Bill [name] is a 39 y.o. male who presents to the clinic today for:   HPI    Retina Follow Up    Patient presents with  Diabetic Retinopathy.  In both eyes.  Severity is moderate.  Duration of 4 weeks.  Since onset it is stable.  I, the attending physician,  performed the HPI with the patient and updated documentation appropriately.          Comments    Patient states vision improves some OU. BS was 202 yesterday am. Last a1c was 5.6, checked 2 months ago. Hasn't taken cosopt for about a month. Was using bid OU.        Last edited by [name], MD on 07/24/2019 11:27 AM. (History)    pt states he is now on dialysis and heparin and feels like his vision improved once he started taking that  Referring physician: Isaac Bliss, Rayford Halsted, MD [place],  [place] [id number]  HISTORICAL INFORMATION:   Selected notes from the MEDICAL RECORD NUMBER Referred by Dr. [name] for concern of severe NPDR / PDR OU; LEE-  Ocular Hx-  PMH-     CURRENT MEDICATIONS: Current Outpatient Medications (Ophthalmic Drugs)  Medication Sig  . dorzolamide-timolol (COSOPT) 22.3-6.8 MG/ML ophthalmic solution Place 1 drop into the right eye 2 (two) times daily. (Patient not taking: Reported on 07/24/2019)   No current facility-administered medications for this visit. (Ophthalmic Drugs)   Current Outpatient Medications (Other)  Medication Sig  . aspirin (BAYER ASPIRIN EC LOW DOSE) 81 MG EC tablet Take 81 mg by mouth as needed.  Marland Kitchen atorvastatin (LIPITOR) 80 MG tablet Take 1 tablet (80 mg total) by mouth every evening.  [name] 1 GM 210 MG(Fe) tablet Take 210 mg by mouth 3 (three) times daily with meals.  . ferric citrate (AURYXIA) 1 GM 210 MG(Fe) tablet Take 210 mg by mouth as directed.  . fluticasone (FLONASE) 50 MCG/ACT  nasal spray Place 1 spray into both nostrils daily as needed for allergies or rhinitis.  . multivitamin (RENA-VIT) TABS tablet Take 1 tablet by mouth daily.  . sertraline (ZOLOFT) 50 MG tablet Take 1 tablet (50 mg total) by mouth daily.   No current facility-administered medications for this visit. (Other)      REVIEW OF SYSTEMS: ROS    Positive for: Genitourinary, Endocrine, Eyes   Negative for: Constitutional, Gastrointestinal, Neurological, Skin, Musculoskeletal, HENT, Cardiovascular, Respiratory, Psychiatric, Allergic/Imm, Heme/Lymph   Last edited by Roselee Nova D, COT on 07/24/2019  9:03 AM. (History)       ALLERGIES Allergies  Allergen Reactions  . Food     Melons-itchy/headaches    PAST MEDICAL HISTORY Past Medical History:  Diagnosis Date  . Anemia   . Anxiety    situational  . Diabetes mellitus without complication ([place])   . Diabetic retinopathy ([place])    PDR OU  . ESRD on dialysis [facility])    Dialyzing T-Th-S  . History of blood transfusion   . Hypertension   . PONV (postoperative nausea and vomiting)    03/22/2019- only 15 years ago  . Renal disorder   . Stroke [facility]) 2019   vision   Past Surgical History:  Procedure Laterality Date  . A/V FISTULAGRAM N/A 03/11/2019   Procedure: A/V FISTULAGRAM - Left Arm;  Surgeon: Waynetta Sandy, MD;  Location: [place] [facility] [facility];  Service: Cardiovascular;  Laterality: N/A;  . arm surgery Right    Fracture  . AV FISTULA PLACEMENT Left 01/25/2019   Procedure: ARTERIOVENOUS (AV) FISTULA CREATION  LEFT UPPER  ARM;  Surgeon: Serafina Mitchell, MD;  Location: [place];  Service: Vascular;  Laterality: Left;  . BASCILIC VEIN TRANSPOSITION Left 03/27/2019   Procedure: SECOND STAGE BASILIC VEIN TRANSPOSITION LEFT ARM;  Surgeon: Serafina Mitchell, MD;  Location: [place];  Service: Vascular;  Laterality: Left;  . WISDOM TOOTH EXTRACTION      FAMILY HISTORY Family History  Problem Relation Age of Onset  . Diabetes Mellitus  II Mother   . Hypertension Mother   . Cataracts Mother   . Diabetes Mellitus II Father   . Stroke Father   . Hypertension Father   . Glaucoma Maternal Grandmother   . Amblyopia Neg Hx   . Blindness Neg Hx   . Macular degeneration Neg Hx   . Retinal detachment Neg Hx   . Strabismus Neg Hx   . Retinitis pigmentosa Neg Hx     SOCIAL HISTORY Social History   Tobacco Use  . Smoking status: Never Smoker  . Smokeless tobacco: Never Used  Substance Use Topics  . Alcohol use: Not Currently  . Drug use: Not Currently         OPHTHALMIC EXAM:  Base Eye Exam    Visual Acuity (Snellen - Linear)      Right Left   Dist [place] 20/50 20/60 +2   Dist ph [place] 20/50 +2 20/40 -1       Tonometry (Tonopen, 9:12 AM)      Right Left   Pressure 15 19       Pupils      Dark Light Shape React APD   Right 4 3 Round Brisk None   Left 4 3 Round Brisk None       Visual Fields (Counting fingers)      Left Right     Full   Restrictions Partial outer inferior nasal deficiency   No VF defects noted OD today       Extraocular Movement      Right Left    Full, Ortho Full, Ortho       Neuro/Psych    Oriented x3: Yes   Mood/Affect: Normal       Dilation    Both eyes: 1.0% Mydriacyl, 2.5% Phenylephrine @ 9:12 AM        Slit Lamp and Fundus Exam    Slit Lamp Exam      Right Left   Lids/Lashes Normal Normal   Conjunctiva/Sclera White and quiet White and quiet   Cornea Clear Clear   Anterior Chamber Deep and quiet Deep and quiet   Iris Round and dilated, No NVI Round and dilated, no NVI   Lens 1+ Nuclear sclerosis, 1+ Cortical cataract, trace Posterior subcapsular cataract 1+ Nuclear sclerosis, 1+ Cortical cataract, trace Posterior subcapsular cataract   Vitreous Pre-retinal heme nasal to disc, diffuse subhyaloid heme temporal hemisphere; VH turning white and settling inferiorly, blood clots inferiorly obscuring view Trace, old white VH settled inferiorly       Fundus Exam       Right Left   Disc Trace Pallor, sharp rim, Fine NVD, +fibrosis Mild pallor, sharp rim, +fibrosis; +fine NVD   C/D Ratio 0.1 0.2   Macula Partially obscured by blood stained vitreous and subhyaloid heme --  improved, tractional edema superior macula Flat, Blunted foveal reflex, sclerotic arterioiles, scattered focal exudate -- improved, diffuse atrophy, flat choroidal nevus superior macula   Vessels Vascular attenuation, Tortuous, +fibrotic NVE along ST arcades Sclerotic arterioles, Vascular attenuation, Tortuous   Periphery Attached, nasal retina clear, large pre-retinal heme nasal to disc, temporal hemisphere obscured by vitreous heme, 360 PRP, red pre-retinal heme along IN arcades Attached; scattered IRH; good 360 PRP in place          IMAGING AND PROCEDURES  Imaging and Procedures for @TODAY @  OCT, Retina - OU - Both Eyes       Right Eye Quality was good. Central Foveal Thickness: 285. Progression has improved. Findings include abnormal foveal contour, intraretinal hyper-reflective material, preretinal fibrosis, vitreous traction, epiretinal membrane, no SRF, macular pucker, intraretinal fluid (Interval increase in tractional edema superior macula, interval thickening of vitreous membranes, vitreous opacities -- slightly improved).   Left Eye Quality was good. Central Foveal Thickness: 242. Progression has been stable. Findings include abnormal foveal contour, intraretinal hyper-reflective material, no SRF, inner retinal atrophy, epiretinal membrane, no IRF, preretinal fibrosis, outer retinal atrophy, vitreomacular adhesion  (Diffuse atrophy, stable from prior).   Notes *Images captured and stored on drive  Diagnosis / Impression:  OD: Persistent pre retinal fibrosis, scattered subhyaloid and vitreous heme -- improved; interval improvement in vitreous opacities OS: Diffuse atrophy, stable from prior    Clinical management:  See below  Abbreviations: NFP - Normal foveal profile.  CME - cystoid macular edema. PED - pigment epithelial detachment. IRF - intraretinal fluid. SRF - subretinal fluid. EZ - ellipsoid zone. ERM - epiretinal membrane. ORA - outer retinal atrophy. ORT - outer retinal tubulation. SRHM - subretinal hyper-reflective material         Intravitreal Injection, Pharmacologic Agent - OD - Right Eye       Time Out 07/24/2019. 9:49 AM. Confirmed correct patient, procedure, site, and patient consented.   Anesthesia Topical anesthesia was used. Anesthetic medications included Lidocaine 2%, Proparacaine 0.5%.   Procedure Preparation included 5% betadine to ocular surface, eyelid speculum. A supplied needle was used.   Injection:  1.25 mg Bevacizumab (AVASTIN) SOLN   NDC: [id number], Lot: [id number] , Expiration date: 08/28/2019   Route: Intravitreal, Site: Right Eye, Waste: 0 mL  Post-op Post injection exam found visual acuity of at least counting fingers. The patient tolerated the procedure well. There were no complications. The patient received written and verbal post procedure care education.                 ASSESSMENT/PLAN:   ICD-10-CM   1. Proliferative diabetic retinopathy of both eyes with macular edema associated with type 2 diabetes mellitus (HCC)  09/10/2019 Intravitreal Injection, Pharmacologic Agent - OD - Right Eye    Bevacizumab (AVASTIN) SOLN 1.25 mg  2. Retinal edema  H35.81 OCT, Retina - OU - Both Eyes  3. Essential hypertension  I10   4. Hypertensive retinopathy of both eyes  H35.033   5. History of noncompliance with medical treatment  Z91.19   6. Acute CVA (cerebrovascular accident) ([place])  I63.9   7. Ocular hypertension, bilateral  H40.053      1,2. Proliferative diabetic retinopathy OU  - s/p PRP OD (10.02.19), fill-in (01.22.20), fill in OD (02.12.20)   - s/p PRP OS (05/02/2018), fill-in (02.19.20)  - s/p IVK #1 OD (01.22.20), OS (02.12.20)  - s/p IVA OD #1 (10.21.20), #2 (12.02.20), #3 (01.13.21)  - s/p IVA OS  #1 (12.02.20)  - FA  8.19.19 shows leaking MA, patches of capillary nonperfusion OU  - pt was lost to f/u from 02.19.2020 to 10.21.20  - discussed the importance of compliance with medical therapy and follow ups  - presented acutely (10.21.2020) for decreased vision and new onset floaters OD  - today, BCVA 20/50 OD (improved from 20/200); OS 20/40 (stable)  - exam shows improved VH, preretinal and subhyaloid hemorrhage, persistent preretinal fibrosis OD   - OCT OD Interval increase in tractional edema superior macula, interval thickening of vitreous membranes, vitreous opacities -- slightly improved; OS with stable diffuse atrophy  - VH precautions reviewed -- minimize activities, keep head elevated, avoid ASA/NSAIDs/blood thinners as able  - pt has now started heparin  - discussed possibility of surgery being needed in the future to stabilize eye and prevent further progression of diabetic eye disease  - recommend IVA OD #4 today, 02.10.21  - pt wishes to proceed  - RBA of procedure discussed, questions answered  - informed consent obtained   - Avastin informed consent form signed and scanned on 01.13.2021  - see procedure note  - f/u 4 weeks -- DFE/OCT/ f/u VH OD  3-5. 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 [name] in mid August 2019 with a 2 wk history intractable headaches and decreased vision OU  - found to have BP of 170s/90s -- sent to ED and was [facility] to [facility] where SBP was up to 200  - discussed importance of tight BP control  - pt reports improved compliance with BP medications and improvement in BP  6. Acute CVA secondary to uncontrolled HTN  - MRI on 8.14.19:  IMPRESSION:   1. Limited 2 sequence MRI head: Acute RIGHT periatrial subcentimeter nonhemorrhagic infarct, in region of optic tract.  - could be limiting vision -- will check formal visual field once macular edema improves  7. Ocular Hypertention OU  - today, IOP OD  15, OS 19  - pt reports not taking Cosopt BID OU for about a month -- IOP okay    Ophthalmic Meds Ordered this visit:  Meds ordered this encounter  Medications  . Bevacizumab (AVASTIN) SOLN 1.25 mg       Return in about 4 weeks (around 08/21/2019) for f/u PDR OU, DFE, OCT.  There are no Patient Instructions on file for this visit.   Explained the diagnoses, plan, and follow up with the patient and they expressed understanding.  Patient expressed understanding of the importance of proper follow up care.    This document serves as a record of services personally performed by Gardiner Sleeper, MD, PhD. It was created on their behalf by Ernest Mallick, OA, an ophthalmic assistant. The creation of this record is the provider's dictation and/or activities during the visit.    Electronically signed by: Ernest Mallick, OA 02.10.2021 12:55 PM   Gardiner Sleeper, M.D., Ph.D. Diseases & Surgery of the Retina and Vitreous [facility]  I have reviewed the above documentation for accuracy and completeness, and I agree with the above. Gardiner Sleeper, M.D., Ph.D. 07/24/19 12:55 PM   Abbreviations: M myopia (nearsighted); A astigmatism; H hyperopia (farsighted); P presbyopia; Mrx spectacle prescription;  CTL contact lenses; OD right eye; OS left eye; OU both eyes  XT exotropia; ET esotropia; PEK punctate epithelial keratitis; PEE punctate epithelial erosions; DES dry eye syndrome; MGD meibomian gland dysfunction; ATs artificial tears; PFAT's preservative free artificial tears; [place] nuclear sclerotic cataract;  PSC posterior subcapsular cataract; ERM epi-retinal membrane; PVD posterior vitreous detachment; RD retinal detachment; DM diabetes mellitus; DR diabetic retinopathy; NPDR non-proliferative diabetic retinopathy; PDR proliferative diabetic retinopathy; CSME clinically significant macular edema; DME diabetic macular edema; dbh dot blot hemorrhages; CWS cotton wool spot; POAG  primary open angle glaucoma; C/D cup-to-disc ratio; HVF humphrey visual field; GVF goldmann visual field; OCT optical coherence tomography; IOP intraocular pressure; BRVO Branch retinal vein occlusion; CRVO central retinal vein occlusion; CRAO central retinal artery occlusion; BRAO branch retinal artery occlusion; RT retinal tear; SB scleral buckle; PPV pars plana vitrectomy; VH Vitreous hemorrhage; PRP panretinal laser photocoagulation; IVK intravitreal kenalog; VMT vitreomacular traction; MH Macular hole;  NVD neovascularization of the disc; NVE neovascularization elsewhere; AREDS age related eye disease study; ARMD age related macular degeneration; POAG primary open angle glaucoma; EBMD epithelial/anterior basement membrane dystrophy; ACIOL anterior chamber intraocular lens; IOL intraocular lens; PCIOL posterior chamber intraocular lens; Phaco/IOL phacoemulsification with intraocular lens placement; [place] photorefractive keratectomy; LASIK laser assisted in situ keratomileusis; HTN hypertension; DM diabetes mellitus; COPD chronic obstructive pulmonary disease

## 2019-07-24 ENCOUNTER — Ambulatory Visit (INDEPENDENT_AMBULATORY_CARE_PROVIDER_SITE_OTHER): Payer: 59 | Admitting: Ophthalmology

## 2019-07-24 ENCOUNTER — Encounter (INDEPENDENT_AMBULATORY_CARE_PROVIDER_SITE_OTHER): Payer: Self-pay | Admitting: Ophthalmology

## 2019-07-24 VITALS — BP 121/79 | HR 84

## 2019-07-24 DIAGNOSIS — Z9119 Patient's noncompliance with other medical treatment and regimen: Secondary | ICD-10-CM

## 2019-07-24 DIAGNOSIS — I10 Essential (primary) hypertension: Secondary | ICD-10-CM

## 2019-07-24 DIAGNOSIS — H35033 Hypertensive retinopathy, bilateral: Secondary | ICD-10-CM

## 2019-07-24 DIAGNOSIS — H40053 Ocular hypertension, bilateral: Secondary | ICD-10-CM

## 2019-07-24 DIAGNOSIS — H3581 Retinal edema: Secondary | ICD-10-CM

## 2019-07-24 DIAGNOSIS — E113513 Type 2 diabetes mellitus with proliferative diabetic retinopathy with macular edema, bilateral: Secondary | ICD-10-CM | POA: Diagnosis not present

## 2019-07-24 DIAGNOSIS — I639 Cerebral infarction, unspecified: Secondary | ICD-10-CM

## 2019-07-24 DIAGNOSIS — Z91199 Patient's noncompliance with other medical treatment and regimen due to unspecified reason: Secondary | ICD-10-CM

## 2019-07-24 MED ORDER — BEVACIZUMAB CHEMO INJECTION 1.25MG/0.05ML SYRINGE FOR KALEIDOSCOPE
1.2500 mg | INTRAVITREAL | Status: AC | PRN
  Administered 2019-07-24: 13:00:00 1.25 mg via INTRAVITREAL

## 2019-08-05 ENCOUNTER — Telehealth: Payer: Self-pay | Admitting: Internal Medicine

## 2019-08-05 NOTE — Telephone Encounter (Signed)
Patient states he is still interested in seeing a Urologist.  He said he thinks he missed his appointment.

## 2019-08-06 NOTE — Telephone Encounter (Signed)
Telephone number to [facility] was given to the patient to schedule an appointment.

## 2019-08-16 ENCOUNTER — Encounter: Payer: Self-pay | Admitting: Internal Medicine

## 2019-08-21 ENCOUNTER — Encounter (INDEPENDENT_AMBULATORY_CARE_PROVIDER_SITE_OTHER): Payer: Self-pay

## 2019-08-21 ENCOUNTER — Encounter (INDEPENDENT_AMBULATORY_CARE_PROVIDER_SITE_OTHER): Payer: 59 | Admitting: Ophthalmology

## 2019-09-18 ENCOUNTER — Encounter (INDEPENDENT_AMBULATORY_CARE_PROVIDER_SITE_OTHER): Payer: 59 | Admitting: Ophthalmology

## 2019-09-25 NOTE — Progress Notes (Addendum)
[facility] Note  09/30/2019     CHIEF COMPLAINT Patient presents for Retina Follow Up   HISTORY OF PRESENT ILLNESS: Bill [name] is a 39 y.o. male who presents to the clinic today for:   HPI    Retina Follow Up    Patient presents with  Diabetic Retinopathy.  In both eyes.  This started weeks ago.  Severity is moderate.  Duration of weeks.  Since onset it is stable.  I, the attending physician,  performed the HPI with the patient and updated documentation appropriately.          Comments    Pt states vision is about the same OU but has more light sensitivity than normal OS.  Pt denies eye pain or discomfort.  Patient denies any new or worsening floaters or fol OU.       Last edited by [name], MD on 09/30/2019  8:04 AM. (History)    pt is currently trying to get a kidney transplant, he states the blood work has started and he starts the physical tests next week, he remains on dialysis (T, Th, Sa), he states his vision seems about the same, but it seems "really bright" in his left eye  Referring physician: Isaac Bliss, Rayford Halsted, MD [address] [place],  [place] [address]  HISTORICAL INFORMATION:   Selected notes from the [facility] Referred by Dr. [name] for concern of severe NPDR / PDR OU; LEE-  Ocular Hx-  PMH-     CURRENT MEDICATIONS: Current Outpatient Medications (Ophthalmic Drugs)  Medication Sig  . dorzolamide-timolol (COSOPT) 22.3-6.8 MG/ML ophthalmic solution Place 1 drop into the right eye 2 (two) times daily. (Patient not taking: Reported on 07/24/2019)   No current facility-administered medications for this visit. (Ophthalmic Drugs)   Current Outpatient Medications (Other)  Medication Sig  . aspirin (BAYER ASPIRIN EC LOW DOSE) 81 MG EC tablet Take 81 mg by mouth as needed.  Marland Kitchen atorvastatin (LIPITOR) 80 MG tablet Take 1 tablet (80 mg total) by mouth every evening.  [name] 1 GM 210 MG(Fe)  tablet Take 210 mg by mouth 3 (three) times daily with meals.  . ferric citrate (AURYXIA) 1 GM 210 MG(Fe) tablet Take 210 mg by mouth as directed.  . fluticasone (FLONASE) 50 MCG/ACT nasal spray Place 1 spray into both nostrils daily as needed for allergies or rhinitis.  . multivitamin (RENA-VIT) TABS tablet Take 1 tablet by mouth daily.  . sertraline (ZOLOFT) 50 MG tablet Take 1 tablet (50 mg total) by mouth daily.   No current facility-administered medications for this visit. (Other)      REVIEW OF SYSTEMS: ROS    Positive for: Genitourinary, Endocrine, Eyes   Negative for: Constitutional, Gastrointestinal, Neurological, Skin, Musculoskeletal, HENT, Cardiovascular, Respiratory, Psychiatric, Allergic/Imm, Heme/Lymph   Last edited by Doneen Poisson on 09/30/2019  7:44 AM. (History)       ALLERGIES Allergies  Allergen Reactions  . Food     Melons-itchy/headaches    PAST MEDICAL HISTORY Past Medical History:  Diagnosis Date  . Anemia   . Anxiety    situational  . Diabetes mellitus without complication ([place])   . Diabetic retinopathy ([place])    PDR OU  . ESRD on dialysis [facility])    Dialyzing T-Th-S  . History of blood transfusion   . Hypertension   . PONV (postoperative nausea and vomiting)    03/22/2019- only 15 years ago  .  Renal disorder   . Stroke [facility]) 2019   vision   Past Surgical History:  Procedure Laterality Date  . A/V FISTULAGRAM N/A 03/11/2019   Procedure: A/V FISTULAGRAM - Left Arm;  Surgeon: Waynetta Sandy, MD;  Location: [facility];  Service: Cardiovascular;  Laterality: N/A;  . arm surgery Right    Fracture  . AV FISTULA PLACEMENT Left 01/25/2019   Procedure: ARTERIOVENOUS (AV) FISTULA CREATION  LEFT UPPER  ARM;  Surgeon: Serafina Mitchell, MD;  Location: [facility];  Service: Vascular;  Laterality: Left;  . BASCILIC VEIN TRANSPOSITION Left 03/27/2019   Procedure: SECOND STAGE BASILIC VEIN TRANSPOSITION LEFT ARM;  Surgeon: Serafina Mitchell,  MD;  Location: [facility];  Service: Vascular;  Laterality: Left;  . WISDOM TOOTH EXTRACTION      FAMILY HISTORY Family History  Problem Relation Age of Onset  . Diabetes Mellitus II Mother   . Hypertension Mother   . Cataracts Mother   . Diabetes Mellitus II Father   . Stroke Father   . Hypertension Father   . Glaucoma Maternal Grandmother   . Amblyopia Neg Hx   . Blindness Neg Hx   . Macular degeneration Neg Hx   . Retinal detachment Neg Hx   . Strabismus Neg Hx   . Retinitis pigmentosa Neg Hx     SOCIAL HISTORY Social History   Tobacco Use  . Smoking status: Never Smoker  . Smokeless tobacco: Never Used  Substance Use Topics  . Alcohol use: Not Currently  . Drug use: Not Currently         OPHTHALMIC EXAM:  Base Eye Exam    Visual Acuity (Snellen - Linear)      Right Left   Dist [place] 20/80 +1 20/150 -2   Dist ph [place] 20/40 +2 20/100 +2       Tonometry (Tonopen, 7:50 AM)      Right Left   Pressure 14 14       Pupils      Dark Light Shape React APD   Right 3 2 Round Brisk 0   Left 3 2 Round Brisk 0       Visual Fields      Left Right   Restrictions Partial outer superior temporal, inferior temporal, superior nasal deficiencies Partial outer inferior nasal deficiency       Extraocular Movement      Right Left    Full Full       Neuro/Psych    Oriented x3: Yes   Mood/Affect: Normal       Dilation    Both eyes: 1.0% Mydriacyl, 2.5% Phenylephrine @ 7:50 AM        Slit Lamp and Fundus Exam    Slit Lamp Exam      Right Left   Lids/Lashes Normal Normal   Conjunctiva/Sclera White and quiet White and quiet   Cornea Trace Punctate epithelial erosions Trace Punctate epithelial erosions   Anterior Chamber Deep and quiet Deep and quiet   Iris Round and dilated, No NVI Round and dilated, no NVI   Lens 1+ Nuclear sclerosis, 1-2+ Cortical cataract, 2+Posterior subcapsular cataract 1+ Nuclear sclerosis, 1-2+ Cortical cataract, 2-3+ Posterior subcapsular  cataract   Vitreous VH improved, scattered fibrosis superior to disc syneresis       Fundus Exam      Right Left   Disc Trace Pallor, sharp rim, Fine NVD, +fibrosis Mild pallor, sharp rim, +fibrosis; +fine NVD   C/D Ratio  0.1 0.2   Macula Blunted foveal reflex, tractional fibrosis along temporal arcades Flat, good foveal reflex, diffuse atrophy, flat choroidal nevus superior macula   Vessels Vascular attenuation, Tortuous, +fibrotic NVE along ST arcades Sclerotic arterioles, Tortuous, +NV   Periphery Attached, 360 PRP, tractional fibrosis supeiror and along arcades Attached; good 360 PRP in place          IMAGING AND PROCEDURES  Imaging and Procedures for @TODAY @  OCT, Retina - OU - Both Eyes       Right Eye Quality was good. Central Foveal Thickness: 290. Progression has improved. Findings include abnormal foveal contour, intraretinal hyper-reflective material, preretinal fibrosis, vitreous traction, epiretinal membrane, no SRF, macular pucker, intraretinal fluid (Interval improvement in vitreous opacities, vitreous traction and tractional edema).   Left Eye Quality was good. Central Foveal Thickness: 250. Progression has worsened. Findings include abnormal foveal contour, intraretinal hyper-reflective material, no SRF, inner retinal atrophy, epiretinal membrane, no IRF, preretinal fibrosis, outer retinal atrophy, vitreomacular adhesion  (Interval increase in preretinal fibrosis and progression of diffuse atrophy).   Notes *Images captured and stored on drive  Diagnosis / Impression:  PDR OU OD: Interval improvement in vitreous opacities, vitreous traction and tractional edema  OS: Interval increase in preretinal fibrosis and progression of diffuse atrophy  Clinical management:  See below  Abbreviations: NFP - Normal foveal profile. CME - cystoid macular edema. PED - pigment epithelial detachment. IRF - intraretinal fluid. SRF - subretinal fluid. EZ - ellipsoid zone. ERM -  epiretinal membrane. ORA - outer retinal atrophy. ORT - outer retinal tubulation. SRHM - subretinal hyper-reflective material         Intravitreal Injection, Pharmacologic Agent - OD - Right Eye       Time Out 09/30/2019. 8:14 AM. Confirmed correct patient, procedure, site, and patient consented.   Anesthesia Topical anesthesia was used. Anesthetic medications included Lidocaine 2%, Proparacaine 0.5%.   Procedure Preparation included 5% betadine to ocular surface, eyelid speculum. A (32g) needle was used.   Injection:  1.25 mg Bevacizumab (AVASTIN) SOLN   NDC: 29528-413-24, Lot: [id number] , Expiration date: 12/26/2019   Route: Intravitreal, Site: Right Eye, Waste: 0 mL  Post-op Post injection exam found visual acuity of at least counting fingers. The patient tolerated the procedure well. There were no complications. The patient received written and verbal post procedure care education.        Intravitreal Injection, Pharmacologic Agent - OS - Left Eye       Time Out 09/30/2019. 8:32 AM. Confirmed correct patient, procedure, site, and patient consented.   Anesthesia Topical anesthesia was used. Anesthetic medications included Lidocaine 2%, Proparacaine 0.5%.   Procedure Preparation included 5% betadine to ocular surface, eyelid speculum. A (32g) needle was used.   Injection:  1.25 mg Bevacizumab (AVASTIN) SOLN   NDC: 10/13/2019, Lot: [id number] , Expiration date: 11/14/2019   Route: Intravitreal, Site: Left Eye, Waste: 0 mL  Post-op Post injection exam found visual acuity of at least counting fingers. The patient tolerated the procedure well. There were no complications. The patient received written and verbal post procedure care education.                 ASSESSMENT/PLAN:   ICD-10-CM   1. Proliferative diabetic retinopathy of both eyes with macular edema associated with type 2 diabetes mellitus (HCC)  : [id number]$ Intravitreal Injection, Pharmacologic Agent -  OD - Right Eye    Intravitreal Injection, Pharmacologic Agent - OS - Left Eye    Bevacizumab (AVASTIN) SOLN 1.25 mg  Bevacizumab (AVASTIN) SOLN 1.25 mg  2. Retinal edema  H35.81 OCT, Retina - OU - Both Eyes  3. Essential hypertension  I10   4. Hypertensive retinopathy of both eyes  H35.033   5. Acute CVA (cerebrovascular accident) ([place])  I63.9   6. Ocular hypertension, bilateral  H40.053   7. Combined forms of age-related cataract of both eyes  H25.813    1,2. Proliferative diabetic retinopathy OU  - delayed f/u from 4 wks to 9.5 wks  - s/p PRP OD (10.02.19), fill-in (01.22.20), fill in OD (02.12.20)   - s/p PRP OS (05/02/2018), fill-in (02.19.20)  - s/p IVK #1 OD (01.22.20), OS (02.12.20)  - s/p IVA OD #1 (10.21.20), #2 (12.02.20), #3 (01.13.21), #4 (02.10.21)  - s/p IVA OS #1 (12.02.20)  - FA 8.19.19 shows leaking MA, patches of capillary nonperfusion OU  - pt was lost to f/u from 02.19.2020 to 10.21.20  - discussed the importance of compliance with medical therapy and follow ups  - presented acutely (10.21.2020) for decreased vision and new onset floaters OD  - today, BCVA 20/40 OD (improved from 20/500); OS 20/100 (decreased from 20/40)  - exam shows improved VH, preretinal and subhyaloid hemorrhage, persistent preretinal fibrosis OD; OS with interval worsening of NV  - OCT OD: Interval improvement in vitreous opacities / vitreous traction and IRF; OS: Interval increase preretinal fibrosis and mild progression in diffuse atrophy   - VH precautions reviewed -- minimize activities, keep head elevated, avoid ASA/NSAIDs/blood thinners as able  - pt now on heparin  - discussed possibility of surgery being needed in the future to stabilize eye and prevent further progression of diabetic eye disease  - recommend IVA OD #5 and OS #2 today, 04.19.21  - pt wishes to proceed  - RBA of procedure discussed, questions answered  - informed consent obtained   - Avastin informed consent form  signed and scanned on 01.13.2021  - see procedure note  - f/u 4-6 weeks -- DFE/OCT, possible injection  3,4. 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 [name] in mid August 2019 with a 2 wk history intractable headaches and decreased vision OU  - found to have BP of 170s/90s -- sent to ED and was [facility] to [facility] where SBP was up to 200  - discussed importance of tight BP control  - pt reports improved compliance with BP medications and improvement in BP  5. Acute CVA secondary to uncontrolled HTN  - MRI on 8.14.19:  IMPRESSION:   1. Limited 2 sequence MRI head: Acute RIGHT periatrial subcentimeter nonhemorrhagic infarct, in region of optic tract.  - could be limiting vision -- will check formal visual field once macular edema improves  6. Ocular Hypertention OU  - today, IOP OD 15, OS 19  - pt reports not taking Cosopt BID OU for about a month -- IOP okay  7. Mixed cataract OU  - The symptoms of cataract, surgical options, and treatments and risks were discussed with patient.  - discussed diagnosis and progression  - progression of PSC OU (OS > OD)  - monitor for now, but discussed possible need for cataract referral / surgery     Ophthalmic Meds Ordered this visit:  Meds ordered this encounter  Medications  . Bevacizumab (AVASTIN) SOLN 1.25 mg  . Bevacizumab (AVASTIN) SOLN 1.25 mg       Return for f/u 4-6 weeks, PDR OU, DFE, OCT.  There are no Patient Instructions on  file for this visit.   Explained the diagnoses, plan, and follow up with the patient and they expressed understanding.  Patient expressed understanding of the importance of proper follow up care.   This document serves as a record of services personally performed by Gardiner Sleeper, MD, PhD. It was created on their behalf by Leeann Must, [place], a certified ophthalmic assistant. The creation of this record is the provider's dictation and/or activities during  the visit.    Electronically signed by: Leeann Must, COA @TODAY @ 12:57 PM   This document serves as a record of services personally performed by Gardiner Sleeper, MD, PhD. It was created on their behalf by Ernest Mallick, OA, an ophthalmic assistant. The creation of this record is the provider's dictation and/or activities during the visit.    Electronically signed by: Ernest Mallick, OA 04.19.2021 12:57 PM   Gardiner Sleeper, M.D., Ph.D. Diseases & Surgery of the Retina and Vitreous [facility]  I have reviewed the above documentation for accuracy and completeness, and I agree with the above. Gardiner Sleeper, M.D., Ph.D. 09/30/19 12:57 PM   Abbreviations: M myopia (nearsighted); A astigmatism; H hyperopia (farsighted); P presbyopia; Mrx spectacle prescription;  CTL contact lenses; OD right eye; OS left eye; OU both eyes  XT exotropia; ET esotropia; PEK punctate epithelial keratitis; PEE punctate epithelial erosions; DES dry eye syndrome; MGD meibomian gland dysfunction; ATs artificial tears; PFAT's preservative free artificial tears; [place] nuclear sclerotic cataract; PSC posterior subcapsular cataract; ERM epi-retinal membrane; PVD posterior vitreous detachment; RD retinal detachment; DM diabetes mellitus; DR diabetic retinopathy; NPDR non-proliferative diabetic retinopathy; PDR proliferative diabetic retinopathy; CSME clinically significant macular edema; DME diabetic macular edema; dbh dot blot hemorrhages; CWS cotton wool spot; POAG primary open angle glaucoma; C/D cup-to-disc ratio; HVF humphrey visual field; GVF goldmann visual field; OCT optical coherence tomography; IOP intraocular pressure; BRVO Branch retinal vein occlusion; CRVO central retinal vein occlusion; CRAO central retinal artery occlusion; BRAO branch retinal artery occlusion; RT retinal tear; SB scleral buckle; PPV pars plana vitrectomy; VH Vitreous hemorrhage; PRP panretinal laser photocoagulation; IVK  intravitreal kenalog; VMT vitreomacular traction; MH Macular hole;  NVD neovascularization of the disc; NVE neovascularization elsewhere; AREDS age related eye disease study; ARMD age related macular degeneration; POAG primary open angle glaucoma; EBMD epithelial/anterior basement membrane dystrophy; ACIOL anterior chamber intraocular lens; IOL intraocular lens; PCIOL posterior chamber intraocular lens; Phaco/IOL phacoemulsification with intraocular lens placement; [place] photorefractive keratectomy; LASIK laser assisted in situ keratomileusis; HTN hypertension; DM diabetes mellitus; COPD chronic obstructive pulmonary disease

## 2019-09-30 ENCOUNTER — Other Ambulatory Visit: Payer: Self-pay

## 2019-09-30 ENCOUNTER — Encounter (INDEPENDENT_AMBULATORY_CARE_PROVIDER_SITE_OTHER): Payer: Self-pay | Admitting: Ophthalmology

## 2019-09-30 ENCOUNTER — Ambulatory Visit (INDEPENDENT_AMBULATORY_CARE_PROVIDER_SITE_OTHER): Payer: 59 | Admitting: Ophthalmology

## 2019-09-30 VITALS — BP 153/73 | HR 66

## 2019-09-30 DIAGNOSIS — H35033 Hypertensive retinopathy, bilateral: Secondary | ICD-10-CM | POA: Diagnosis not present

## 2019-09-30 DIAGNOSIS — E113513 Type 2 diabetes mellitus with proliferative diabetic retinopathy with macular edema, bilateral: Secondary | ICD-10-CM

## 2019-09-30 DIAGNOSIS — H3581 Retinal edema: Secondary | ICD-10-CM | POA: Diagnosis not present

## 2019-09-30 DIAGNOSIS — I10 Essential (primary) hypertension: Secondary | ICD-10-CM

## 2019-09-30 DIAGNOSIS — I639 Cerebral infarction, unspecified: Secondary | ICD-10-CM

## 2019-09-30 DIAGNOSIS — H40053 Ocular hypertension, bilateral: Secondary | ICD-10-CM

## 2019-09-30 DIAGNOSIS — H25813 Combined forms of age-related cataract, bilateral: Secondary | ICD-10-CM

## 2019-09-30 MED ORDER — BEVACIZUMAB CHEMO INJECTION 1.25MG/0.05ML SYRINGE FOR KALEIDOSCOPE
1.2500 mg | INTRAVITREAL | Status: AC | PRN
  Administered 2019-09-30: 1.25 mg via INTRAVITREAL

## 2019-09-30 MED ORDER — BEVACIZUMAB CHEMO INJECTION 1.25MG/0.05ML SYRINGE FOR KALEIDOSCOPE
1.2500 mg | INTRAVITREAL | Status: AC | PRN
  Administered 2019-09-30: 13:00:00 1.25 mg via INTRAVITREAL

## 2019-10-23 ENCOUNTER — Encounter: Payer: Self-pay | Admitting: Internal Medicine

## 2019-10-25 ENCOUNTER — Telehealth (INDEPENDENT_AMBULATORY_CARE_PROVIDER_SITE_OTHER): Payer: Self-pay

## 2019-10-31 ENCOUNTER — Encounter: Payer: Self-pay | Admitting: Internal Medicine

## 2019-11-01 NOTE — Progress Notes (Signed)
[facility] Note  11/04/2019     CHIEF COMPLAINT Patient presents for Retina Follow Up   HISTORY OF PRESENT ILLNESS: Bill [name] is a 39 y.o. male who presents to the clinic today for:   HPI    Retina Follow Up    Patient presents with  Diabetic Retinopathy.  In both eyes.  This started 5 weeks ago.  Severity is moderate.  I, the attending physician,  performed the HPI with the patient and updated documentation appropriately.          Comments    Patient here for 5 weeks retina follow up for PDR OU. Patient states vision not good. Blurry both eye. Thinks may have bleeding both eyes. Started about 2 weeks ago. No eye pain.        Last edited by [name], MD on 11/04/2019  8:22 AM. (History)    pt states about 2 weeks ago he noticed a decrease in his left eye vision, he is in the process of getting on the organ transplant list to get a new kidney   Referring physician: Isaac Bliss, Rayford Halsted, MD [place],  [place] [address]  HISTORICAL INFORMATION:   Selected notes from the MEDICAL RECORD NUMBER Referred by Dr. [name] for concern of severe NPDR / PDR OU; LEE-  Ocular Hx-  PMH-     CURRENT MEDICATIONS: Current Outpatient Medications (Ophthalmic Drugs)  Medication Sig  . dorzolamide-timolol (COSOPT) 22.3-6.8 MG/ML ophthalmic solution Place 1 drop into the right eye 2 (two) times daily. (Patient not taking: Reported on 07/24/2019)   No current facility-administered medications for this visit. (Ophthalmic Drugs)   Current Outpatient Medications (Other)  Medication Sig  . aspirin (BAYER ASPIRIN EC LOW DOSE) 81 MG EC tablet Take 81 mg by mouth as needed.  Marland Kitchen atorvastatin (LIPITOR) 80 MG tablet Take 1 tablet (80 mg total) by mouth every evening.  [name] 1 GM 210 MG(Fe) tablet Take 210 mg by mouth 3 (three) times daily with meals.  . ferric citrate (AURYXIA) 1 GM 210 MG(Fe) tablet Take 210 mg by mouth as  directed.  . fluticasone (FLONASE) 50 MCG/ACT nasal spray Place 1 spray into both nostrils daily as needed for allergies or rhinitis.  . multivitamin (RENA-VIT) TABS tablet Take 1 tablet by mouth daily.  . sertraline (ZOLOFT) 50 MG tablet Take 1 tablet (50 mg total) by mouth daily.   No current facility-administered medications for this visit. (Other)      REVIEW OF SYSTEMS: ROS    Positive for: Genitourinary, Endocrine, Eyes   Negative for: Constitutional, Gastrointestinal, Neurological, Skin, Musculoskeletal, HENT, Cardiovascular, Respiratory, Psychiatric, Allergic/Imm, Heme/Lymph   Last edited by [name], COA on 11/04/2019  7:59 AM. (History)       ALLERGIES Allergies  Allergen Reactions  . Food     Melons-itchy/headaches    PAST MEDICAL HISTORY Past Medical History:  Diagnosis Date  . Anemia   . Anxiety    situational  . Diabetes mellitus without complication ([place])   . Diabetic retinopathy ([place])    PDR OU  . ESRD on dialysis [facility])    Dialyzing T-Th-S  . History of blood transfusion   . Hypertension   . PONV (postoperative nausea and vomiting)    03/22/2019- only 15 years ago  . Renal disorder   . Stroke [facility]) 2019   vision   Past Surgical History:  Procedure Laterality Date  .  A/V FISTULAGRAM N/A 03/11/2019   Procedure: A/V FISTULAGRAM - Left Arm;  Surgeon: Waynetta Sandy, MD;  Location: [facility];  Service: Cardiovascular;  Laterality: N/A;  . arm surgery Right    Fracture  . AV FISTULA PLACEMENT Left 01/25/2019   Procedure: ARTERIOVENOUS (AV) FISTULA CREATION  LEFT UPPER  ARM;  Surgeon: Serafina Mitchell, MD;  Location: [facility];  Service: Vascular;  Laterality: Left;  . BASCILIC VEIN TRANSPOSITION Left 03/27/2019   Procedure: SECOND STAGE BASILIC VEIN TRANSPOSITION LEFT ARM;  Surgeon: Serafina Mitchell, MD;  Location: [facility];  Service: Vascular;  Laterality: Left;  . WISDOM TOOTH EXTRACTION      FAMILY HISTORY Family History   Problem Relation Age of Onset  . Diabetes Mellitus II Mother   . Hypertension Mother   . Cataracts Mother   . Diabetes Mellitus II Father   . Stroke Father   . Hypertension Father   . Glaucoma Maternal Grandmother   . Amblyopia Neg Hx   . Blindness Neg Hx   . Macular degeneration Neg Hx   . Retinal detachment Neg Hx   . Strabismus Neg Hx   . Retinitis pigmentosa Neg Hx     SOCIAL HISTORY Social History   Tobacco Use  . Smoking status: Never Smoker  . Smokeless tobacco: Never Used  Substance Use Topics  . Alcohol use: Not Currently  . Drug use: Not Currently         OPHTHALMIC EXAM:  Base Eye Exam    Visual Acuity (Snellen - Linear)      Right Left   Dist [place] 20/150 20/400 +1   Dist ph [place] 20/100 -2 NI       Tonometry (Tonopen, 7:55 AM)      Right Left   Pressure 19 20       Pupils      Dark Light Shape React APD   Right 4 3 Round Brisk None   Left 4 3 Round Brisk None       Visual Fields      Left Right   Restrictions Partial outer superior temporal, inferior temporal, superior nasal deficiencies Partial outer inferior nasal deficiency       Extraocular Movement      Right Left    Full, Ortho Full, Ortho       Neuro/Psych    Oriented x3: Yes   Mood/Affect: Normal       Dilation    Both eyes: 1.0% Mydriacyl, 2.5% Phenylephrine @ 7:55 AM        Slit Lamp and Fundus Exam    Slit Lamp Exam      Right Left   Lids/Lashes Normal Normal   Conjunctiva/Sclera White and quiet White and quiet   Cornea Trace Punctate epithelial erosions Trace Punctate epithelial erosions   Anterior Chamber Deep and quiet Deep and quiet   Iris Round and dilated, No NVI Round and dilated, no NVI   Lens 1+ Nuclear sclerosis, 1-2+ Cortical cataract, 2+Posterior subcapsular cataract 1+ Nuclear sclerosis, 1-2+ Cortical cataract, 3+ Posterior subcapsular cataract   Vitreous VH improved, scattered fibrosis superior to disc, blood stained vitreous condensations centrally,  white blood stained vitreous settled inferiorly Syneresis, blood stained VH centrally       Fundus Exam      Right Left   Disc Trace Pallor, sharp rim, +heme Hazy view, +fibrosis; regressing NVD superiorly   C/D Ratio 0.1 0.2   Macula Obscured by heme, +fibrosis, +IRH  Partially obscured by VH, Flat, good foveal reflex, atrophic, flat choroidal nevus superior macula   Vessels Vascular attenuation, Tortuous, +fibrotic NVE along ST arcades Sclerotic arterioles, Tortuous, +NV   Periphery Attached, 360 PRP, tractional fibrosis supeiror and along arcades Attached; good 360 PRP in place          IMAGING AND PROCEDURES  Imaging and Procedures for @TODAY @  OCT, Retina - OU - Both Eyes       Right Eye Quality was good. Central Foveal Thickness: 291. Progression has worsened. Findings include abnormal foveal contour, intraretinal hyper-reflective material, preretinal fibrosis, vitreous traction, epiretinal membrane, no SRF, macular pucker, intraretinal fluid (Interval increase in vitreous opacities, vitreous traction and tractional edema).   Left Eye Quality was good. Central Foveal Thickness: 236. Progression has worsened. Findings include abnormal foveal contour, intraretinal hyper-reflective material, no SRF, inner retinal atrophy, epiretinal membrane, preretinal fibrosis, outer retinal atrophy, vitreomacular adhesion , intraretinal fluid (Interval development of vitreous opacities and subhylaoid heme; interval progression of vitreous traction).   Notes *Images captured and stored on drive  Diagnosis / Impression:  PDR OU OD: Interval increase in vitreous opacities, vitreous traction and tractional edema OS: Interval development of vitreous opacities and subhylaoid heme; interval progression of vitreous traction  Clinical management:  See below  Abbreviations: NFP - Normal foveal profile. CME - cystoid macular edema. PED - pigment epithelial detachment. IRF - intraretinal fluid. SRF -  subretinal fluid. EZ - ellipsoid zone. ERM - epiretinal membrane. ORA - outer retinal atrophy. ORT - outer retinal tubulation. SRHM - subretinal hyper-reflective material         Intravitreal Injection, Pharmacologic Agent - OD - Right Eye       Time Out 11/04/2019. 8:50 AM. Confirmed correct patient, procedure, site, and patient consented.   Anesthesia Topical anesthesia was used. Anesthetic medications included Lidocaine 2%, Proparacaine 0.5%.   Procedure Preparation included 5% betadine to ocular surface, eyelid speculum. A supplied needle was used.   Injection:  1.25 mg Bevacizumab (AVASTIN) SOLN   NDC: 16109-604-54, Lot: [id number] , Expiration date: 12/25/2019   Route: Intravitreal, Site: Right Eye, Waste: 0 mL  Post-op Post injection exam found visual acuity of at least counting fingers. The patient tolerated the procedure well. There were no complications. The patient received written and verbal post procedure care education.        Intravitreal Injection, Pharmacologic Agent - OS - Left Eye       Time Out 11/04/2019. 8:51 AM. Confirmed correct patient, procedure, site, and patient consented.   Anesthesia Topical anesthesia was used. Anesthetic medications included Lidocaine 2%, Proparacaine 0.5%.   Procedure Preparation included 5% betadine to ocular surface, eyelid speculum. A (32g) needle was used.   Injection:  1.25 mg Bevacizumab (AVASTIN) SOLN   NDC: 11/17/2019, Lot: [id number] , Expiration date: 01/07/2020   Route: Intravitreal, Site: Left Eye, Waste: 0 mL  Post-op Post injection exam found visual acuity of at least counting fingers. The patient tolerated the procedure well. There were no complications. The patient received written and verbal post procedure care education.                 ASSESSMENT/PLAN:   ICD-10-CM   1. Proliferative diabetic retinopathy of both eyes with macular edema associated with type 2 diabetes mellitus (HCC)   [id number] Intravitreal Injection, Pharmacologic Agent - OD - Right Eye    Intravitreal Injection, Pharmacologic Agent - OS - Left Eye    Bevacizumab (AVASTIN) SOLN 1.25 mg    Bevacizumab (AVASTIN)  SOLN 1.25 mg  2. Vitreous hemorrhage of both eyes ([place])  H43.13   3. Retinal edema  H35.81 OCT, Retina - OU - Both Eyes  4. Essential hypertension  I10   5. Hypertensive retinopathy of both eyes  H35.033   6. Acute CVA (cerebrovascular accident) ([place])  I63.9   7. Ocular hypertension, bilateral  H40.053   8. Combined forms of age-related cataract of both eyes  H25.813    1-3. Proliferative diabetic retinopathy OU  - s/p PRP OD (10.02.19), fill-in (01.22.20), fill in OD (02.12.20)   - s/p PRP OS (05/02/2018), fill-in (02.19.20)  - s/p IVK #1 OD (01.22.20), OS (02.12.20)  - s/p IVA OD #1 (10.21.20), #2 (12.02.20), #3 (01.13.21), #4 (02.10.21), #5 (04.19.21)  - s/p IVA OS #1 (12.02.20), #2 (04.19.21)  - FA 8.19.19 shows leaking MA, patches of capillary nonperfusion OU  - pt was lost to f/u from 02.19.2020 to 10.21.20  - discussed the importance of compliance with medical therapy and follow ups  - presented acutely (10.21.2020) for decreased vision and new onset floaters OD  - today, pt reports subjective decrease in vision OU -- increased floaters / heme  - BCVA 20/100 OD (dec from 20/40); OS 20/400 (decreased from 20/100)  - exam shows interval increase in VH, preretinal and subhyaloid hemorrhage, persistent preretinal fibrosis OU  - OCT OD: Interval increase in vitreous opacities / vitreous traction and IRF; OS: Interval development of vitreous opacities and subhyaloid heme  - VH precautions reviewed -- minimize activities, keep head elevated, avoid ASA/NSAIDs/blood thinners as able  - pt now on heparin  - discussed possibility of surgery being needed in the future to stabilize eye and prevent further progression of diabetic eye disease  - recommend IVA OD #6 and OS #3 today, 05.21.21  - pt  wishes to proceed  - RBA of procedure discussed, questions answered  - informed consent obtained   - Avastin informed consent form signed and scanned on 01.13.2021  - see procedure note  - f/u 4 weeks -- DFE/OCT, possible injection  4,5. 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 [name] in mid August 2019 with a 2 wk history intractable headaches and decreased vision OU  - found to have BP of 170s/90s -- sent to ED and was [facility] to [facility] where SBP was up to 200  - discussed importance of tight BP control  - pt reports improved compliance with BP medications and improvement in BP  6. Acute CVA secondary to uncontrolled HTN  - MRI on 8.14.19:  IMPRESSION:   1. Limited 2 sequence MRI head: Acute RIGHT periatrial subcentimeter nonhemorrhagic infarct, in region of optic tract.  - could be limiting vision -- will check formal visual field once macular edema improves  7. Ocular Hypertention OU  - today, IOP OD 19, OS 20  - pt reports not taking Cosopt BID OU for about a month -- IOP okay  8. Mixed cataract OU  - The symptoms of cataract, surgical options, and treatments and risks were discussed with patient.  - discussed diagnosis and progression  - progression of PSC OU (OS > OD)  - monitor for now, but discussed possible need for cataract referral / surgery     Ophthalmic Meds Ordered this visit:  Meds ordered this encounter  Medications  . Bevacizumab (AVASTIN) SOLN 1.25 mg  . Bevacizumab (AVASTIN) SOLN 1.25 mg       Return in about 4 weeks (around  12/02/2019) for f/u PDR OU, DFE, OCT.  There are no Patient Instructions on file for this visit.   Explained the diagnoses, plan, and follow up with the patient and they expressed understanding.  Patient expressed understanding of the importance of proper follow up care.   This document serves as a record of services personally performed by Gardiner Sleeper, MD, PhD. It was created  on their behalf by Ernest Mallick, OA, an ophthalmic assistant. The creation of this record is the provider's dictation and/or activities during the visit.    Electronically signed by: Ernest Mallick, OA 05.24.2021 1:00 PM  Gardiner Sleeper, M.D., Ph.D. Diseases & Surgery of the Retina and [place] 11/04/2019   I have reviewed the above documentation for accuracy and completeness, and I agree with the above. Gardiner Sleeper, M.D., Ph.D. 11/04/19 1:00 PM   Abbreviations: M myopia (nearsighted); A astigmatism; H hyperopia (farsighted); P presbyopia; Mrx spectacle prescription;  CTL contact lenses; OD right eye; OS left eye; OU both eyes  XT exotropia; ET esotropia; PEK punctate epithelial keratitis; PEE punctate epithelial erosions; DES dry eye syndrome; MGD meibomian gland dysfunction; ATs artificial tears; PFAT's preservative free artificial tears; [place] nuclear sclerotic cataract; PSC posterior subcapsular cataract; ERM epi-retinal membrane; PVD posterior vitreous detachment; RD retinal detachment; DM diabetes mellitus; DR diabetic retinopathy; NPDR non-proliferative diabetic retinopathy; PDR proliferative diabetic retinopathy; CSME clinically significant macular edema; DME diabetic macular edema; dbh dot blot hemorrhages; CWS cotton wool spot; POAG primary open angle glaucoma; C/D cup-to-disc ratio; HVF humphrey visual field; GVF goldmann visual field; OCT optical coherence tomography; IOP intraocular pressure; BRVO Branch retinal vein occlusion; CRVO central retinal vein occlusion; CRAO central retinal artery occlusion; BRAO branch retinal artery occlusion; RT retinal tear; SB scleral buckle; PPV pars plana vitrectomy; VH Vitreous hemorrhage; PRP panretinal laser photocoagulation; IVK intravitreal kenalog; VMT vitreomacular traction; MH Macular hole;  NVD neovascularization of the disc; NVE neovascularization elsewhere; AREDS age related eye disease study; ARMD age related  macular degeneration; POAG primary open angle glaucoma; EBMD epithelial/anterior basement membrane dystrophy; ACIOL anterior chamber intraocular lens; IOL intraocular lens; PCIOL posterior chamber intraocular lens; Phaco/IOL phacoemulsification with intraocular lens placement; [place] photorefractive keratectomy; LASIK laser assisted in situ keratomileusis; HTN hypertension; DM diabetes mellitus; COPD chronic obstructive pulmonary disease

## 2019-11-04 ENCOUNTER — Encounter (INDEPENDENT_AMBULATORY_CARE_PROVIDER_SITE_OTHER): Payer: Self-pay | Admitting: Ophthalmology

## 2019-11-04 ENCOUNTER — Other Ambulatory Visit: Payer: Self-pay

## 2019-11-04 ENCOUNTER — Ambulatory Visit (INDEPENDENT_AMBULATORY_CARE_PROVIDER_SITE_OTHER): Payer: 59 | Admitting: Ophthalmology

## 2019-11-04 ENCOUNTER — Telehealth: Payer: Self-pay | Admitting: Internal Medicine

## 2019-11-04 DIAGNOSIS — E113513 Type 2 diabetes mellitus with proliferative diabetic retinopathy with macular edema, bilateral: Secondary | ICD-10-CM | POA: Diagnosis not present

## 2019-11-04 DIAGNOSIS — H4313 Vitreous hemorrhage, bilateral: Secondary | ICD-10-CM

## 2019-11-04 DIAGNOSIS — H25813 Combined forms of age-related cataract, bilateral: Secondary | ICD-10-CM

## 2019-11-04 DIAGNOSIS — H3581 Retinal edema: Secondary | ICD-10-CM

## 2019-11-04 DIAGNOSIS — I639 Cerebral infarction, unspecified: Secondary | ICD-10-CM

## 2019-11-04 DIAGNOSIS — I10 Essential (primary) hypertension: Secondary | ICD-10-CM | POA: Diagnosis not present

## 2019-11-04 DIAGNOSIS — H35033 Hypertensive retinopathy, bilateral: Secondary | ICD-10-CM

## 2019-11-04 DIAGNOSIS — H40053 Ocular hypertension, bilateral: Secondary | ICD-10-CM

## 2019-11-04 MED ORDER — BEVACIZUMAB CHEMO INJECTION 1.25MG/0.05ML SYRINGE FOR KALEIDOSCOPE
1.2500 mg | INTRAVITREAL | Status: AC | PRN
  Administered 2019-11-04: 1.25 mg via INTRAVITREAL

## 2019-11-04 NOTE — Telephone Encounter (Signed)
Pt is stating that the sertraline is not working for him and thinks that he needs something stronger.  Pharm:  [facility] on [address].

## 2019-11-05 NOTE — Telephone Encounter (Signed)
Yes

## 2019-11-05 NOTE — Telephone Encounter (Signed)
Would you like a virtual visit

## 2019-11-05 NOTE — Telephone Encounter (Signed)
Left message on machine for patient to call back and schedule a virtual/telephone visit to discuss increasing/changing his medication

## 2019-11-28 NOTE — Progress Notes (Addendum)
[facility] Note  12/02/2019     CHIEF COMPLAINT Patient presents for Retina Follow Up   HISTORY OF PRESENT ILLNESS: Bill [name] is a 39 y.o. male who presents to the clinic today for:   HPI    Retina Follow Up    Patient presents with  Diabetic Retinopathy.  In both eyes.  This started weeks ago.  Severity is moderate.  Duration of weeks.  Since onset it is gradually improving.  I, the attending physician,  performed the HPI with the patient and updated documentation appropriately.          Comments    Pt states his vision is improving in his right eye.  Pt denies eye pain or discomfort.  Pt denies any new or worsening floaters or fol OU.       Last edited by [name], MD on 12/02/2019  9:14 AM. (History)    pt states vision in right eye seems to be getting better   Referring physician: Isaac Bliss, Rayford Halsted, MD [address],  [place] [address]  HISTORICAL INFORMATION:   Selected notes from the MEDICAL RECORD NUMBER Referred by Dr. [name] for concern of severe NPDR / PDR OU; LEE-  Ocular Hx-  PMH-     CURRENT MEDICATIONS: Current Outpatient Medications (Ophthalmic Drugs)  Medication Sig  . dorzolamide-timolol (COSOPT) 22.3-6.8 MG/ML ophthalmic solution Place 1 drop into the right eye 2 (two) times daily. (Patient not taking: Reported on 07/24/2019)   No current facility-administered medications for this visit. (Ophthalmic Drugs)   Current Outpatient Medications (Other)  Medication Sig  . aspirin (BAYER ASPIRIN EC LOW DOSE) 81 MG EC tablet Take 81 mg by mouth as needed.  Marland Kitchen atorvastatin (LIPITOR) 80 MG tablet Take 1 tablet (80 mg total) by mouth every evening.  [name] 1 GM 210 MG(Fe) tablet Take 210 mg by mouth 3 (three) times daily with meals.  . ferric citrate (AURYXIA) 1 GM 210 MG(Fe) tablet Take 210 mg by mouth as directed.  . fluticasone (FLONASE) 50 MCG/ACT nasal spray Place 1 spray into both  nostrils daily as needed for allergies or rhinitis.  . multivitamin (RENA-VIT) TABS tablet Take 1 tablet by mouth daily.  . sertraline (ZOLOFT) 50 MG tablet Take 1 tablet (50 mg total) by mouth daily.   No current facility-administered medications for this visit. (Other)      REVIEW OF SYSTEMS: ROS    Positive for: Genitourinary, Endocrine, Eyes   Negative for: Constitutional, Gastrointestinal, Neurological, Skin, Musculoskeletal, HENT, Cardiovascular, Respiratory, Psychiatric, Allergic/Imm, Heme/Lymph   Last edited by Doneen Poisson on 12/02/2019  7:59 AM. (History)       ALLERGIES Allergies  Allergen Reactions  . Food     Melons-itchy/headaches    PAST MEDICAL HISTORY Past Medical History:  Diagnosis Date  . Anemia   . Anxiety    situational  . Diabetes mellitus without complication ([place])   . Diabetic retinopathy ([place])    PDR OU  . ESRD on dialysis [facility])    Dialyzing T-Th-S  . History of blood transfusion   . Hypertension   . PONV (postoperative nausea and vomiting)    03/22/2019- only 15 years ago  . Renal disorder   . Stroke [facility]) 2019   vision   Past Surgical History:  Procedure Laterality Date  . A/V FISTULAGRAM N/A 03/11/2019   Procedure: A/V FISTULAGRAM - Left Arm;  Surgeon: Waynetta Sandy, MD;  Location: [facility];  Service: Cardiovascular;  Laterality: N/A;  . arm surgery Right    Fracture  . AV FISTULA PLACEMENT Left 01/25/2019   Procedure: ARTERIOVENOUS (AV) FISTULA CREATION  LEFT UPPER  ARM;  Surgeon: Serafina Mitchell, MD;  Location: [facility];  Service: Vascular;  Laterality: Left;  . BASCILIC VEIN TRANSPOSITION Left 03/27/2019   Procedure: SECOND STAGE BASILIC VEIN TRANSPOSITION LEFT ARM;  Surgeon: Serafina Mitchell, MD;  Location: [place];  Service: Vascular;  Laterality: Left;  . WISDOM TOOTH EXTRACTION      FAMILY HISTORY Family History  Problem Relation Age of Onset  . Diabetes Mellitus II Mother   . Hypertension Mother   .  Cataracts Mother   . Diabetes Mellitus II Father   . Stroke Father   . Hypertension Father   . Glaucoma Maternal Grandmother   . Amblyopia Neg Hx   . Blindness Neg Hx   . Macular degeneration Neg Hx   . Retinal detachment Neg Hx   . Strabismus Neg Hx   . Retinitis pigmentosa Neg Hx     SOCIAL HISTORY Social History   Tobacco Use  . Smoking status: Never Smoker  . Smokeless tobacco: Never Used  Vaping Use  . Vaping Use: Never used  Substance Use Topics  . Alcohol use: Not Currently  . Drug use: Not Currently         OPHTHALMIC EXAM:  Base Eye Exam    Visual Acuity (Snellen - Linear)      Right Left   Dist [place] 20/70 -2 CF @ 1'   Dist ph [place] 20/50 -2        Tonometry (Tonopen, 8:05 AM)      Right Left   Pressure 14 19       Pupils      Dark Light Shape React APD   Right 4 3 Round Brisk 0   Left 4 3 Round Brisk 0       Visual Fields      Left Right   Restrictions Partial outer superior temporal, inferior temporal, superior nasal deficiencies Partial outer superior nasal, inferior nasal deficiencies       Extraocular Movement      Right Left    Full Full       Neuro/Psych    Oriented x3: Yes   Mood/Affect: Normal       Dilation    Both eyes: 1.0% Mydriacyl, 2.5% Phenylephrine @ 8:05 AM        Slit Lamp and Fundus Exam    Slit Lamp Exam      Right Left   Lids/Lashes Normal Normal   Conjunctiva/Sclera White and quiet White and quiet   Cornea Trace Punctate epithelial erosions Trace Punctate epithelial erosions   Anterior Chamber Deep and quiet Deep and quiet   Iris Round and dilated, No NVI Round and dilated, no NVI   Lens 1+ Nuclear sclerosis, 1-2+ Cortical cataract, 2+Posterior subcapsular cataract 1+ Nuclear sclerosis, 1-2+ Cortical cataract, 3+ Posterior subcapsular cataract   Vitreous VH improved, scattered fibrosis superior to disc, blood stained vitreous condensations centrally, settling inferiorly and turning white Syneresis, blood stained  VH centrally - clearing slightly and settling inferiorly       Fundus Exam      Right Left   Disc Trace Pallor, sharp rim, +heme +fibrosis superiorly extending into arcades; regressing NVD superiorly, 2-3+ Pallor   C/D Ratio 0.1 0.2   Macula Obscured by heme, +fibrosis, +  [place] Partially obscured by VH, Flat, blunted foveal reflex, atrophic, flat choroidal nevus superior macula   Vessels Vascular attenuation, Tortuous, +fibrosis, +NV inferior to disc Sclerotic arterioles, Tortuous, +NV, fibrosis along superior arcades   Periphery Attached, 360 PRP, tractional fibrosis supeiror and along arcades Attached; good 360 PRP in place          IMAGING AND PROCEDURES  Imaging and Procedures for @TODAY @  OCT, Retina - OU - Both Eyes       Right Eye Quality was good. Central Foveal Thickness: 301. Progression has improved. Findings include abnormal foveal contour, intraretinal hyper-reflective material, preretinal fibrosis, vitreous traction, epiretinal membrane, no SRF, macular pucker, intraretinal fluid (Mild Interval improvement in vitreous opacities, IRF/edema, persistent tractional membranes).   Left Eye Quality was good. Central Foveal Thickness: 195. Progression has improved. Findings include abnormal foveal contour, intraretinal hyper-reflective material, no SRF, inner retinal atrophy, epiretinal membrane, preretinal fibrosis, outer retinal atrophy, vitreomacular adhesion , intraretinal fluid (Mild interval improvement in vitreous opacities; persistent diffuse retinal atrophy and persistent tractional membranes).   Notes *Images captured and stored on drive  Diagnosis / Impression:  PDR OU OD: Mild Interval improvement in vitreous opacities, IRF/edema, persistent tractional membranes OS: Mild interval improvement in vitreous opacities; persistent diffuse retinal atrophy and persistent tractional membranes    Clinical management:  See below  Abbreviations: NFP - Normal foveal profile.  CME - cystoid macular edema. PED - pigment epithelial detachment. IRF - intraretinal fluid. SRF - subretinal fluid. EZ - ellipsoid zone. ERM - epiretinal membrane. ORA - outer retinal atrophy. ORT - outer retinal tubulation. SRHM - subretinal hyper-reflective material         Intravitreal Injection, Pharmacologic Agent - OD - Right Eye       Time Out 12/02/2019. 8:35 AM. Confirmed correct patient, procedure, site, and patient consented.   Anesthesia Topical anesthesia was used. Anesthetic medications included Lidocaine 2%, Proparacaine 0.5%.   Procedure Preparation included 5% betadine to ocular surface, eyelid speculum. A supplied needle was used.   Injection:  1.25 mg Bevacizumab (AVASTIN) SOLN   NDC: 45409-811-91, Lot: [id number] , Expiration date: 02/05/2020   Route: Intravitreal, Site: Right Eye, Waste: 0 mL  Post-op Post injection exam found visual acuity of at least counting fingers. The patient tolerated the procedure well. There were no complications. The patient received written and verbal post procedure care education.        Intravitreal Injection, Pharmacologic Agent - OS - Left Eye       Time Out 12/02/2019. 8:35 AM. Confirmed correct patient, procedure, site, and patient consented.   Anesthesia Topical anesthesia was used. Anesthetic medications included Lidocaine 2%, Proparacaine 0.5%.   Procedure Preparation included 5% betadine to ocular surface, eyelid speculum. A supplied needle was used.   Injection:  1.25 mg Bevacizumab (AVASTIN) SOLN   NDC: 12/15/2019, Lot: [id number] , Expiration date: 01/22/2020   Route: Intravitreal, Site: Left Eye, Waste: 0 mg  Post-op Post injection exam found visual acuity of at least counting fingers. The patient tolerated the procedure well. There were no complications. The patient received written and verbal post procedure care education.                 ASSESSMENT/PLAN:   ICD-10-CM   1. Proliferative diabetic  retinopathy of both eyes with macular edema associated with type 2 diabetes mellitus (HCC)  : [id number] Intravitreal Injection, Pharmacologic Agent - OD - Right Eye    Intravitreal Injection, Pharmacologic Agent - OS - Left Eye    Bevacizumab (  AVASTIN) SOLN 1.25 mg    Bevacizumab (AVASTIN) SOLN 1.25 mg  2. Vitreous hemorrhage of both eyes ([place])  H43.13   3. Retinal edema  H35.81 OCT, Retina - OU - Both Eyes  4. Essential hypertension  I10   5. Hypertensive retinopathy of both eyes  H35.033   6. Ocular hypertension, bilateral  H40.053   7. Acute CVA (cerebrovascular accident) ([place])  I63.9   8. Combined forms of age-related cataract of both eyes  H25.813   9. History of noncompliance with medical treatment  Z91.19    1-3. Proliferative diabetic retinopathy OU  - s/p PRP OD (10.02.19), fill-in (01.22.20), fill in OD (02.12.20)   - s/p PRP OS (05/02/2018), fill-in (02.19.20)  - s/p IVK #1 OD (01.22.20), OS (02.12.20)  - s/p IVA OD #1 (10.21.20), #2 (12.02.20), #3 (01.13.21), #4 (02.10.21), #5 (04.19.21), #6 (05.21.21)  - s/p IVA OS #1 (12.02.20), #2 (04.19.21), #3 (05.21.21)  - FA 8.19.19 shows leaking MA, patches of capillary nonperfusion OU  - pt was lost to f/u from 02.19.2020 to 10.21.20  - discussed the importance of compliance with medical therapy and follow ups  - presented acutely (10.21.2020) for decreased vision and new onset floaters OD  - today, pt reports subjective improvement in vision OD -- decrease in floaters / heme  - BCVA 20/50 OD (improved from 20/100); OS CF (decreased from 20/400)  - exam shows interval decrease in VH, preretinal and subhyaloid hemorrhage, persistent preretinal fibrosis OU  - OCT OD: Mild Interval improvement in vitreous opacities, IRF/edema, persistent tractional membranes; OS: Mild interval improvement in vitreous opacities; persistent diffuse retinal atrophy and persistent tractional membranes  - VH precautions reviewed -- minimize activities, keep head  elevated, avoid ASA/NSAIDs/blood thinners as able  - pt now on heparin  - discussed possibility of surgery being needed in the future to stabilize eye and prevent further progression of diabetic eye disease  - recommend IVA OD #7 and OS #4 today, 06.21.21  - pt wishes to proceed  - RBA of procedure discussed, questions answered  - informed consent obtained   - Avastin informed consent form signed and scanned on 01.13.2021  - see procedure note  - f/u 4 weeks -- DFE/OCT, possible injection  4,5. 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 [name] in mid August 2019 with a 2 wk history intractable headaches and decreased vision OU  - found to have BP of 170s/90s -- sent to ED and was admitted to [facility] where SBP was up to 200  - discussed importance of tight BP control  - pt reports improved compliance with BP medications and improvement in BP  6. Acute CVA secondary to uncontrolled HTN  - MRI on 8.14.19:  IMPRESSION:   1. Limited 2 sequence MRI head: Acute RIGHT periatrial subcentimeter nonhemorrhagic infarct, in region of optic tract.  - could be limiting vision -- will check formal visual field once macular edema improves  7. Ocular Hypertention OU  - today, IOP OD 14, OS 19  - pt reports not taking Cosopt BID OU for about a month -- IOP okay  8. Mixed cataract OU  - The symptoms of cataract, surgical options, and treatments and risks were discussed with patient.  - discussed diagnosis and progression  - progression of PSC OU (OS > OD)  - will refer to Dr. Kathlen Mody for cataract consult     Ophthalmic Meds Ordered this visit:  Meds ordered this encounter  Medications  . Bevacizumab (AVASTIN) SOLN 1.25 mg  . Bevacizumab (AVASTIN) SOLN 1.25 mg       Return in about 4 weeks (around 12/30/2019) for f/u PDR OU, DFE, OCT.  There are no Patient Instructions on file for this visit.   Explained the diagnoses, plan, and follow up with  the patient and they expressed understanding.  Patient expressed understanding of the importance of proper follow up care.   This document serves as a record of services personally performed by Gardiner Sleeper, MD, PhD. It was created on their behalf by Leeann Must, [place], a certified ophthalmic assistant. The creation of this record is the provider's dictation and/or activities during the visit.    Electronically signed by: Leeann Must, COA @TODAY @ 9:20 AM   This document serves as a record of services personally performed by Gardiner Sleeper, MD, PhD. It was created on their behalf by Ernest Mallick, OA, an ophthalmic assistant. The creation of this record is the provider's dictation and/or activities during the visit.    Electronically signed by: Ernest Mallick, OA 06.21.2021 9:20 AM   Gardiner Sleeper, M.D., Ph.D. Diseases & Surgery of the Retina and Vitreous [facility]  I have reviewed the above documentation for accuracy and completeness, and I agree with the above. Gardiner Sleeper, M.D., Ph.D. 12/02/19 9:20 AM   Abbreviations: M myopia (nearsighted); A astigmatism; H hyperopia (farsighted); P presbyopia; Mrx spectacle prescription;  CTL contact lenses; OD right eye; OS left eye; OU both eyes  XT exotropia; ET esotropia; PEK punctate epithelial keratitis; PEE punctate epithelial erosions; DES dry eye syndrome; MGD meibomian gland dysfunction; ATs artificial tears; PFAT's preservative free artificial tears; [place] nuclear sclerotic cataract; PSC posterior subcapsular cataract; ERM epi-retinal membrane; PVD posterior vitreous detachment; RD retinal detachment; DM diabetes mellitus; DR diabetic retinopathy; NPDR non-proliferative diabetic retinopathy; PDR proliferative diabetic retinopathy; CSME clinically significant macular edema; DME diabetic macular edema; dbh dot blot hemorrhages; CWS cotton wool spot; POAG primary open angle glaucoma; C/D cup-to-disc ratio; HVF humphrey  visual field; GVF goldmann visual field; OCT optical coherence tomography; IOP intraocular pressure; BRVO Branch retinal vein occlusion; CRVO central retinal vein occlusion; CRAO central retinal artery occlusion; BRAO branch retinal artery occlusion; RT retinal tear; SB scleral buckle; PPV pars plana vitrectomy; VH Vitreous hemorrhage; PRP panretinal laser photocoagulation; IVK intravitreal kenalog; VMT vitreomacular traction; MH Macular hole;  NVD neovascularization of the disc; NVE neovascularization elsewhere; AREDS age related eye disease study; ARMD age related macular degeneration; POAG primary open angle glaucoma; EBMD epithelial/anterior basement membrane dystrophy; ACIOL anterior chamber intraocular lens; IOL intraocular lens; PCIOL posterior chamber intraocular lens; Phaco/IOL phacoemulsification with intraocular lens placement; [place] photorefractive keratectomy; LASIK laser assisted in situ keratomileusis; HTN hypertension; DM diabetes mellitus; COPD chronic obstructive pulmonary disease

## 2019-12-01 ENCOUNTER — Other Ambulatory Visit: Payer: Self-pay | Admitting: Internal Medicine

## 2019-12-01 DIAGNOSIS — F411 Generalized anxiety disorder: Secondary | ICD-10-CM

## 2019-12-02 ENCOUNTER — Other Ambulatory Visit: Payer: Self-pay

## 2019-12-02 ENCOUNTER — Ambulatory Visit (INDEPENDENT_AMBULATORY_CARE_PROVIDER_SITE_OTHER): Payer: 59 | Admitting: Ophthalmology

## 2019-12-02 ENCOUNTER — Encounter (INDEPENDENT_AMBULATORY_CARE_PROVIDER_SITE_OTHER): Payer: Self-pay | Admitting: Ophthalmology

## 2019-12-02 DIAGNOSIS — H3581 Retinal edema: Secondary | ICD-10-CM | POA: Diagnosis not present

## 2019-12-02 DIAGNOSIS — Z91199 Patient's noncompliance with other medical treatment and regimen due to unspecified reason: Secondary | ICD-10-CM

## 2019-12-02 DIAGNOSIS — Z9119 Patient's noncompliance with other medical treatment and regimen: Secondary | ICD-10-CM

## 2019-12-02 DIAGNOSIS — I10 Essential (primary) hypertension: Secondary | ICD-10-CM | POA: Diagnosis not present

## 2019-12-02 DIAGNOSIS — E113513 Type 2 diabetes mellitus with proliferative diabetic retinopathy with macular edema, bilateral: Secondary | ICD-10-CM

## 2019-12-02 DIAGNOSIS — I639 Cerebral infarction, unspecified: Secondary | ICD-10-CM

## 2019-12-02 DIAGNOSIS — H40053 Ocular hypertension, bilateral: Secondary | ICD-10-CM

## 2019-12-02 DIAGNOSIS — H25813 Combined forms of age-related cataract, bilateral: Secondary | ICD-10-CM

## 2019-12-02 DIAGNOSIS — H4313 Vitreous hemorrhage, bilateral: Secondary | ICD-10-CM

## 2019-12-02 DIAGNOSIS — H35033 Hypertensive retinopathy, bilateral: Secondary | ICD-10-CM

## 2019-12-02 MED ORDER — BEVACIZUMAB CHEMO INJECTION 1.25MG/0.05ML SYRINGE FOR KALEIDOSCOPE
1.2500 mg | INTRAVITREAL | Status: AC | PRN
  Administered 2019-12-02: 1.25 mg via INTRAVITREAL

## 2019-12-20 LAB — HM DIABETES EYE EXAM

## 2019-12-24 ENCOUNTER — Encounter: Payer: Self-pay | Admitting: Internal Medicine

## 2020-01-01 ENCOUNTER — Encounter (INDEPENDENT_AMBULATORY_CARE_PROVIDER_SITE_OTHER): Payer: 59 | Admitting: Ophthalmology

## 2020-01-01 ENCOUNTER — Encounter (INDEPENDENT_AMBULATORY_CARE_PROVIDER_SITE_OTHER): Payer: Self-pay

## 2020-01-17 ENCOUNTER — Encounter (INDEPENDENT_AMBULATORY_CARE_PROVIDER_SITE_OTHER): Payer: 59 | Admitting: Ophthalmology

## 2020-01-29 NOTE — Progress Notes (Signed)
[facility] Note  01/30/2020     CHIEF COMPLAINT Patient presents for Retina Follow Up   HISTORY OF PRESENT ILLNESS: Bill [name] is a 39 y.o. male who presents to the clinic today for:   HPI    Retina Follow Up    Patient presents with  Diabetic Retinopathy.  In both eyes.  Duration of 8 weeks.  Since onset it is gradually improving.  I, the attending physician,  performed the HPI with the patient and updated documentation appropriately.          Comments    8 week follow up [place] is better.  He had cataract sx OS 3 weeks ago with Dr. Kathlen Mody (he has a follow up appt today).  Today was his last day of post op drops unless he changes it this afternoon.  Seems like something is covering his eye OS making it hard to see out of it. BS: 158 yesterday A1C: unsure       Last edited by [name], MD on 01/30/2020 11:54 AM. (History)    pt had cataract sx with Dr. Kathlen Mody 3 weeks ago, he has a f/u appt with him this afternoon, today was his last day of post op drops, he got put on the kidney donor transplant list 2 weeks ago   Referring physician: [name], MD [place],  [place] [id number]  HISTORICAL INFORMATION:   Selected notes from the [facility] Referred by Dr. [name] for concern of severe NPDR / PDR OU; LEE-  Ocular Hx-  PMH-     CURRENT MEDICATIONS: Current Outpatient Medications (Ophthalmic Drugs)  Medication Sig   dorzolamide-timolol (COSOPT) 22.3-6.8 MG/ML ophthalmic solution Place 1 drop into the right eye 2 (two) times daily. (Patient not taking: Reported on 07/24/2019)   No current facility-administered medications for this visit. (Ophthalmic Drugs)   Current Outpatient Medications (Other)  Medication Sig   aspirin (BAYER ASPIRIN EC LOW DOSE) 81 MG EC tablet Take 81 mg by mouth as needed.   atorvastatin (LIPITOR) 80 MG tablet Take 1 tablet (80 mg total) by mouth every  evening.   AURYXIA 1 GM 210 MG(Fe) tablet Take 210 mg by mouth 3 (three) times daily with meals.   ferric citrate (AURYXIA) 1 GM 210 MG(Fe) tablet Take 210 mg by mouth as directed.   fluticasone (FLONASE) 50 MCG/ACT nasal spray Place 1 spray into both nostrils daily as needed for allergies or rhinitis.   multivitamin (RENA-VIT) TABS tablet Take 1 tablet by mouth daily.   sertraline (ZOLOFT) 50 MG tablet TAKE 1 TABLET(50 MG) BY MOUTH DAILY   No current facility-administered medications for this visit. (Other)      REVIEW OF SYSTEMS: ROS    Positive for: Genitourinary, Endocrine, Eyes, Psychiatric   Negative for: Constitutional, Gastrointestinal, Neurological, Skin, Musculoskeletal, HENT, Cardiovascular, Respiratory, Allergic/Imm, Heme/Lymph   Last edited by Leonie Douglas, COA on 01/30/2020 10:09 AM. (History)       ALLERGIES Allergies  Allergen Reactions   Food     Melons-itchy/headaches    PAST MEDICAL HISTORY Past Medical History:  Diagnosis Date   Anemia    Anxiety    situational   Diabetes mellitus without complication ([place])    Diabetic retinopathy ([place])    PDR OU   ESRD on dialysis ([place])    Dialyzing T-Th-S   History of blood transfusion    Hypertension    PONV (postoperative nausea  and vomiting)    03/22/2019- only 15 years ago   Renal disorder    Stroke [facility]) 2019   vision   Past Surgical History:  Procedure Laterality Date   A/V FISTULAGRAM N/A 03/11/2019   Procedure: A/V FISTULAGRAM - Left Arm;  Surgeon: Waynetta Sandy, MD;  Location: [facility];  Service: Cardiovascular;  Laterality: N/A;   arm surgery Right    Fracture   AV FISTULA PLACEMENT Left 01/25/2019   Procedure: ARTERIOVENOUS (AV) FISTULA CREATION  LEFT UPPER  ARM;  Surgeon: Serafina Mitchell, MD;  Location: [facility];  Service: Vascular;  Laterality: Left;   [place] Left 03/27/2019   Procedure: SECOND STAGE BASILIC VEIN TRANSPOSITION LEFT ARM;   Surgeon: Serafina Mitchell, MD;  Location: [facility] OR;  Service: Vascular;  Laterality: Left;   WISDOM TOOTH EXTRACTION      FAMILY HISTORY Family History  Problem Relation Age of Onset   Diabetes Mellitus II Mother    Hypertension Mother    Cataracts Mother    Diabetes Mellitus II Father    Stroke Father    Hypertension Father    Glaucoma Maternal Grandmother    Amblyopia Neg Hx    Blindness Neg Hx    Macular degeneration Neg Hx    Retinal detachment Neg Hx    Strabismus Neg Hx    Retinitis pigmentosa Neg Hx     SOCIAL HISTORY Social History   Tobacco Use   Smoking status: Never Smoker   Smokeless tobacco: Never Used  Vaping Use   Vaping Use: Never used  Substance Use Topics   Alcohol use: Not Currently   Drug use: Not Currently         OPHTHALMIC EXAM:  Base Eye Exam    Visual Acuity (Snellen - Linear)      Right Left   Dist [place] 20/40 20/50 -1   Dist ph [place] 20/40 +2 NI  OS- moving head around       Tonometry (Tonopen, 10:18 AM)      Right Left   Pressure 17 15       Pupils      Dark Light Shape React APD   Right 4 3 Round Brisk None   Left 3 2 Round Brisk None       Visual Fields (Counting fingers)      Left Right    Full Full       Extraocular Movement      Right Left    Full Full       Neuro/Psych    Oriented x3: Yes   Mood/Affect: Normal       Dilation    Both eyes: 1.0% Mydriacyl, 2.5% Phenylephrine @ 10:18 AM        Slit Lamp and Fundus Exam    Slit Lamp Exam      Right Left   Lids/Lashes Normal Normal   Conjunctiva/Sclera White and quiet White and quiet   Cornea Trace Punctate epithelial erosions Trace Punctate epithelial erosions   Anterior Chamber Deep and quiet Deep and quiet   Iris Round and dilated, No NVI Round and dilated, no NVI   Lens 1+ Nuclear sclerosis, 1-2+ Cortical cataract, 2+Posterior subcapsular cataract PC IOL in good position   Vitreous VH improved, scattered fibrosis superior to disc,  blood stained vitreous condensations centrally, old VH settling inferiorly  Syneresis, blood stained VH centrally - clearing slightly and settling inferiorly       Fundus  Exam      Right Left   Disc Trace Pallor, sharp rim, +heme, +fibrosis Mild Pallor, Sharp rim, Compact, +fibrosis/regressing NVD, +heme   C/D Ratio 0.1 0.2   Macula Blunted foveal reflex, +fibrosis, +pre-retinal heme, +MA Flat, blunted foveal reflex, atrophic, flat choroidal nevus superior macula, pre-retinal heme inferior macula   Vessels Vascular attenuation, Tortuous, +fibrosis, +NV inferior to disc Sclerotic arterioles, Tortuous, +NV, fibrosis along superior arcades, severe Vascular attenuation   Periphery Attached, 360 PRP, tractional fibrosis superior and along arcades, greatest above disc Attached; good 360 PRP in place, scattered tractional fibrosis          IMAGING AND PROCEDURES  Imaging and Procedures for @TODAY @  OCT, Retina - OU - Both Eyes       Right Eye Quality was good. Central Foveal Thickness: 321. Progression has improved. Findings include abnormal foveal contour, intraretinal hyper-reflective material, preretinal fibrosis, vitreous traction, epiretinal membrane, no SRF, macular pucker, intraretinal fluid (Mild Interval improvement in vitreous opacities and IRF/edema, persistent tractional membranes).   Left Eye Quality was good. Central Foveal Thickness: 203. Progression has improved. Findings include abnormal foveal contour, intraretinal hyper-reflective material, no SRF, inner retinal atrophy, epiretinal membrane, preretinal fibrosis, outer retinal atrophy, vitreomacular adhesion , intraretinal fluid (Mild interval improvement in vitreous opacities and IRF, persistent diffuse retinal atrophy and persistent tractional membranes).   Notes *Images captured and stored on drive  Diagnosis / Impression:  PDR OU OD: Mild Interval improvement in vitreous opacities and IRF/edema, persistent tractional  membranes OS: Mild interval improvement in vitreous opacities and IRF; persistent diffuse retinal atrophy and persistent tractional membranes    Clinical management:  See below  Abbreviations: NFP - Normal foveal profile. CME - cystoid macular edema. PED - pigment epithelial detachment. IRF - intraretinal fluid. SRF - subretinal fluid. EZ - ellipsoid zone. ERM - epiretinal membrane. ORA - outer retinal atrophy. ORT - outer retinal tubulation. SRHM - subretinal hyper-reflective material         Intravitreal Injection, Pharmacologic Agent - OD - Right Eye       Time Out 01/30/2020. 10:49 AM. Confirmed correct patient, procedure, site, and patient consented.   Anesthesia Topical anesthesia was used. Anesthetic medications included Lidocaine 2%, Proparacaine 0.5%.   Procedure Preparation included 5% betadine to ocular surface, eyelid speculum. A supplied needle was used.   Injection:  1.25 mg Bevacizumab (AVASTIN) SOLN   NDC: 95284-132-44, Lot: [id number] , Expiration date: 02/20/2020   Route: Intravitreal, Site: Right Eye, Waste: 0 mL  Post-op Post injection exam found visual acuity of at least counting fingers. The patient tolerated the procedure well. There were no complications. The patient received written and verbal post procedure care education.        Intravitreal Injection, Pharmacologic Agent - OS - Left Eye       Time Out 01/30/2020. 10:49 AM. Confirmed correct patient, procedure, site, and patient consented.   Anesthesia Topical anesthesia was used. Anesthetic medications included Lidocaine 2%, Proparacaine 0.5%.   Procedure Preparation included 5% betadine to ocular surface, eyelid speculum. A (32g) needle was used.   Injection:  1.25 mg Bevacizumab (AVASTIN) SOLN   NDC: 02/12/2020, Lot: [id number], Expiration date: 11/24/2019   Route: Intravitreal, Site: Left Eye, Waste: 0.05 mL  Post-op Post injection exam found visual acuity of at least counting fingers. The  patient tolerated the procedure well. There were no complications. The patient received written and verbal post procedure care education.  ASSESSMENT/PLAN:   ICD-10-CM   1. Proliferative diabetic retinopathy of both eyes with macular edema associated with type 2 diabetes mellitus (HCC)  T73.2202 Intravitreal Injection, Pharmacologic Agent - OD - Right Eye    Intravitreal Injection, Pharmacologic Agent - OS - Left Eye    Bevacizumab (AVASTIN) SOLN 1.25 mg    Bevacizumab (AVASTIN) SOLN 1.25 mg  2. Vitreous hemorrhage of both eyes ([place])  H43.13   3. Retinal edema  H35.81 OCT, Retina - OU - Both Eyes  4. Essential hypertension  I10   5. Hypertensive retinopathy of both eyes  H35.033   6. Ocular hypertension, bilateral  H40.053   7. Acute CVA (cerebrovascular accident) ([place])  I63.9   8. Combined forms of age-related cataract of right eye  H25.811   9. Pseudophakia  Z96.1    1-3. Proliferative diabetic retinopathy OU  - s/p PRP OD (10.02.19), fill-in (01.22.20), fill in OD (02.12.20)   - s/p PRP OS (05/02/2018), fill-in (02.19.20)  - s/p IVK #1 OD (01.22.20), OS (02.12.20)  - s/p IVA OD #1 (10.21.20), #2 (12.02.20), #3 (01.13.21), #4 (02.10.21), #5 (04.19.21), #6 (05.21.21), #7 (06.21.21)  - s/p IVA OS #1 (12.02.20), #2 (04.19.21), #3 (05.21.21), #4 (06.21.21)  - FA 8.19.19 shows leaking MA, patches of capillary nonperfusion OU  - pt was lost to f/u from 02.19.2020 to 10.21.20  - discussed the importance of compliance with medical therapy and follow ups  - presented acutely (10.21.2020) for decreased vision and new onset floaters OD  - today, pt reports subjective improvement in vision OU -- had cataract surgery OS w/ Dr. Kathlen Mody  - BCVA 20/40 OD (improved from 20/50); OS 20/50 (improved after cataract sx)  - exam shows interval decrease in VH, preretinal and subhyaloid hemorrhage, persistent preretinal fibrosis OU  - OCT OD: Mild Interval improvement in vitreous  opacities and IRF/edema, persistent tractional membranes; OS: Mild interval improvement in vitreous opacities and IRF; persistent diffuse retinal atrophy and persistent tractional membranes  - VH precautions reviewed -- minimize activities, keep head elevated, avoid ASA/NSAIDs/blood thinners as able  - pt now on heparin  - discussed possibility of surgery being needed in the future to stabilize eye and prevent further progression of diabetic eye disease  - recommend IVA OD #8 and OS #5 today, 08.19.21  - pt wishes to proceed  - RBA of procedure discussed, questions answered  - informed consent obtained   - Avastin informed consent form signed and scanned on 01.13.2021  - see procedure note  - f/u 4 weeks -- DFE/OCT, possible injection  4,5. 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 [name] in mid August 2019 with a 2 wk history intractable headaches and decreased vision OU  - found to have BP of 170s/90s -- sent to ED and was admitted to [facility] where SBP was up to 200  - discussed importance of tight BP control  - pt reports improved compliance with BP medications and improvement in BP  6. 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 Limited 2 sequence MRI head: Acute RIGHT periatrial subcentimeter  nonhemorrhagic infarct, in region of optic tract.  - could be limiting vision -- will check formal visual field once macular edema improves  7. Ocular Hypertention OU  - today, IOP OD 17, OS 15  - pt reports not taking Cosopt BID OU for about a month -- IOP okay  8. Mixed cataract OD -  The symptoms of cataract, surgical options, and treatments and risks were discussed with patient. - discussed diagnosis and progression - not yet visually significant - monitor for now - under the expert management of Dr. Kathlen Mody  9. Pseudophakia OS  - s/p CE/IOL (07.29.21, Dr. Kathlen Mody)  - IOL in good position, doing well -  monitor   Ophthalmic Meds Ordered this visit:  Meds ordered this encounter  Medications   Bevacizumab (AVASTIN) SOLN 1.25 mg   Bevacizumab (AVASTIN) SOLN 1.25 mg       Return in about 4 weeks (around 02/27/2020) for f/u PDR OU, DFE, OCT.  There are no Patient Instructions on file for this visit.   Explained the diagnoses, plan, and follow up with the patient and they expressed understanding.  Patient expressed understanding of the importance of proper follow up care.   This document serves as a record of services personally performed by Gardiner Sleeper, MD, PhD. It was created on their behalf by Leonie Douglas, an ophthalmic technician. The creation of this record is the provider's dictation and/or activities during the visit.    Electronically signed by: Leonie Douglas COA, 01/30/20  12:11 PM   [facility] Sleeper, M.D., Ph.D. Diseases & Surgery of the Retina and Vitreous [facility]  I have reviewed the above documentation for accuracy and completeness, and I agree with the above. [place] Sleeper, M.D., Ph.D. 01/30/20 12:11 PM   Abbreviations: M myopia (nearsighted); A astigmatism; H hyperopia (farsighted); P presbyopia; Mrx spectacle prescription;  CTL contact lenses; OD right eye; OS left eye; OU both eyes  XT exotropia; ET esotropia; PEK punctate epithelial keratitis; PEE punctate epithelial erosions; DES dry eye syndrome; MGD meibomian gland dysfunction; ATs artificial tears; PFAT's preservative free artificial tears; [place] nuclear sclerotic cataract; PSC posterior subcapsular cataract; ERM epi-retinal membrane; PVD posterior vitreous detachment; RD retinal detachment; DM diabetes mellitus; DR diabetic retinopathy; NPDR non-proliferative diabetic retinopathy; PDR proliferative diabetic retinopathy; CSME clinically significant macular edema; DME diabetic macular edema; dbh dot blot hemorrhages; CWS cotton wool spot; POAG primary open angle glaucoma; C/D cup-to-disc  ratio; HVF humphrey visual field; GVF goldmann visual field; OCT optical coherence tomography; IOP intraocular pressure; BRVO Branch retinal vein occlusion; CRVO central retinal vein occlusion; CRAO central retinal artery occlusion; BRAO branch retinal artery occlusion; RT retinal tear; SB scleral buckle; PPV pars plana vitrectomy; VH Vitreous hemorrhage; PRP panretinal laser photocoagulation; IVK intravitreal kenalog; VMT vitreomacular traction; MH Macular hole;  NVD neovascularization of the disc; NVE neovascularization elsewhere; AREDS age related eye disease study; ARMD age related macular degeneration; POAG primary open angle glaucoma; EBMD epithelial/anterior basement membrane dystrophy; ACIOL anterior chamber intraocular lens; IOL intraocular lens; PCIOL posterior chamber intraocular lens; Phaco/IOL phacoemulsification with intraocular lens placement; [place] photorefractive keratectomy; LASIK laser assisted in situ keratomileusis; HTN hypertension; DM diabetes mellitus; COPD chronic obstructive pulmonary disease

## 2020-01-30 ENCOUNTER — Other Ambulatory Visit: Payer: Self-pay

## 2020-01-30 ENCOUNTER — Ambulatory Visit (INDEPENDENT_AMBULATORY_CARE_PROVIDER_SITE_OTHER): Payer: 59 | Admitting: Ophthalmology

## 2020-01-30 ENCOUNTER — Encounter (INDEPENDENT_AMBULATORY_CARE_PROVIDER_SITE_OTHER): Payer: Self-pay | Admitting: Ophthalmology

## 2020-01-30 DIAGNOSIS — E113513 Type 2 diabetes mellitus with proliferative diabetic retinopathy with macular edema, bilateral: Secondary | ICD-10-CM

## 2020-01-30 DIAGNOSIS — H35033 Hypertensive retinopathy, bilateral: Secondary | ICD-10-CM

## 2020-01-30 DIAGNOSIS — Z9119 Patient's noncompliance with other medical treatment and regimen: Secondary | ICD-10-CM

## 2020-01-30 DIAGNOSIS — I10 Essential (primary) hypertension: Secondary | ICD-10-CM | POA: Diagnosis not present

## 2020-01-30 DIAGNOSIS — H3581 Retinal edema: Secondary | ICD-10-CM

## 2020-01-30 DIAGNOSIS — I639 Cerebral infarction, unspecified: Secondary | ICD-10-CM

## 2020-01-30 DIAGNOSIS — H25811 Combined forms of age-related cataract, right eye: Secondary | ICD-10-CM

## 2020-01-30 DIAGNOSIS — H40053 Ocular hypertension, bilateral: Secondary | ICD-10-CM

## 2020-01-30 DIAGNOSIS — H4313 Vitreous hemorrhage, bilateral: Secondary | ICD-10-CM

## 2020-01-30 DIAGNOSIS — H25813 Combined forms of age-related cataract, bilateral: Secondary | ICD-10-CM

## 2020-01-30 DIAGNOSIS — Z961 Presence of intraocular lens: Secondary | ICD-10-CM

## 2020-01-30 MED ORDER — BEVACIZUMAB CHEMO INJECTION 1.25MG/0.05ML SYRINGE FOR KALEIDOSCOPE
1.2500 mg | INTRAVITREAL | Status: AC | PRN
  Administered 2020-01-30: 1.25 mg via INTRAVITREAL

## 2020-02-04 ENCOUNTER — Other Ambulatory Visit: Payer: Self-pay | Admitting: Internal Medicine

## 2020-02-04 DIAGNOSIS — F411 Generalized anxiety disorder: Secondary | ICD-10-CM

## 2020-02-06 LAB — HM DIABETES EYE EXAM

## 2020-02-11 ENCOUNTER — Encounter: Payer: Self-pay | Admitting: Internal Medicine

## 2020-02-14 ENCOUNTER — Other Ambulatory Visit: Payer: Self-pay

## 2020-02-14 DIAGNOSIS — Z20822 Contact with and (suspected) exposure to covid-19: Secondary | ICD-10-CM

## 2020-02-15 LAB — NOVEL CORONAVIRUS, NAA: SARS-CoV-2, NAA: NOT DETECTED

## 2020-02-19 ENCOUNTER — Encounter: Payer: Self-pay | Admitting: Internal Medicine

## 2020-02-26 ENCOUNTER — Encounter (INDEPENDENT_AMBULATORY_CARE_PROVIDER_SITE_OTHER): Payer: 59 | Admitting: Ophthalmology

## 2020-03-19 NOTE — Progress Notes (Signed)
[facility] Note  03/23/2020     CHIEF COMPLAINT Patient presents for Retina Follow Up   HISTORY OF PRESENT ILLNESS: Bill [name] is a 39 y.o. male who presents to the clinic today for:   HPI    Retina Follow Up    Patient presents with  Diabetic Retinopathy.  In both eyes.  This started 4 weeks ago.  I, the attending physician,  performed the HPI with the patient and updated documentation appropriately.          Comments    Patient here for 4 weeks retina follow up for PDR OU. Patient states vision about the same. No eye pain.        Last edited by Bill Caffey, MD on 03/23/2020  1:53 PM. (History)    pt has had cataract sx OU, he states he feels like it helped his vision  Referring physician: [name], MD [address],  [place] [id number]  HISTORICAL INFORMATION:   Selected notes from the [place] Referred by Dr. Hinton [name] for concern of severe NPDR / PDR OU; LEE-  Ocular Hx-  PMH-     CURRENT MEDICATIONS: Current Outpatient Medications (Ophthalmic Drugs)  Medication Sig  . dorzolamide-timolol (COSOPT) 22.3-6.8 MG/ML ophthalmic solution Place 1 drop into the right eye 2 (two) times daily. (Patient not taking: Reported on 07/24/2019)   No current facility-administered medications for this visit. (Ophthalmic Drugs)   Current Outpatient Medications (Other)  Medication Sig  . aspirin (BAYER ASPIRIN EC LOW DOSE) 81 MG EC tablet Take 81 mg by mouth as needed.  Marland Kitchen atorvastatin (LIPITOR) 80 MG tablet Take 1 tablet (80 mg total) by mouth every evening.  [name] 1 GM 210 MG(Fe) tablet Take 210 mg by mouth 3 (three) times daily with meals.  . ferric citrate (AURYXIA) 1 GM 210 MG(Fe) tablet Take 210 mg by mouth as directed.  . fluticasone (FLONASE) 50 MCG/ACT nasal spray Place 1 spray into both nostrils daily as needed for allergies or rhinitis.  . multivitamin (RENA-VIT) TABS tablet Take 1 tablet by  mouth daily.  . sertraline (ZOLOFT) 50 MG tablet TAKE 1 TABLET(50 MG) BY MOUTH DAILY   No current facility-administered medications for this visit. (Other)      REVIEW OF SYSTEMS: ROS    Positive for: Genitourinary, Endocrine, Eyes, Psychiatric   Negative for: Constitutional, Gastrointestinal, Neurological, Skin, Musculoskeletal, HENT, Cardiovascular, Respiratory, Allergic/Imm, Heme/Lymph   Last edited by Bill [name], COA on 03/23/2020  8:50 AM. (History)       ALLERGIES Allergies  Allergen Reactions  . Food     Melons-itchy/headaches    PAST MEDICAL HISTORY Past Medical History:  Diagnosis Date  . Anemia   . Anxiety    situational  . Diabetes mellitus without complication ([place])   . Diabetic retinopathy ([place])    PDR OU  . ESRD on dialysis [facility])    Dialyzing T-Th-S  . History of blood transfusion   . Hypertension   . PONV (postoperative nausea and vomiting)    03/22/2019- only 15 years ago  . Renal disorder   . Stroke [facility]) 2019   vision   Past Surgical History:  Procedure Laterality Date  . A/V FISTULAGRAM N/A 03/11/2019   Procedure: A/V FISTULAGRAM - Left Arm;  Surgeon: Waynetta Sandy, MD;  Location: [place] [facility];  Service: Cardiovascular;  Laterality: N/A;  . arm surgery Right    Fracture  .  AV FISTULA PLACEMENT Left 01/25/2019   Procedure: ARTERIOVENOUS (AV) FISTULA CREATION  LEFT UPPER  ARM;  Surgeon: Serafina Mitchell, MD;  Location: [place];  Service: Vascular;  Laterality: Left;  . BASCILIC VEIN TRANSPOSITION Left 03/27/2019   Procedure: SECOND STAGE BASILIC VEIN TRANSPOSITION LEFT ARM;  Surgeon: Serafina Mitchell, MD;  Location: [place];  Service: Vascular;  Laterality: Left;  . WISDOM TOOTH EXTRACTION      FAMILY HISTORY Family History  Problem Relation Age of Onset  . Diabetes Mellitus II Mother   . Hypertension Mother   . Cataracts Mother   . Diabetes Mellitus II Father   . Stroke Father   . Hypertension Father   . Glaucoma  Maternal Grandmother   . Amblyopia Neg Hx   . Blindness Neg Hx   . Macular degeneration Neg Hx   . Retinal detachment Neg Hx   . Strabismus Neg Hx   . Retinitis pigmentosa Neg Hx     SOCIAL HISTORY Social History   Tobacco Use  . Smoking status: Never Smoker  . Smokeless tobacco: Never Used  Vaping Use  . Vaping Use: Never used  Substance Use Topics  . Alcohol use: Not Currently  . Drug use: Not Currently         OPHTHALMIC EXAM:  Base Eye Exam    Visual Acuity (Snellen - Linear)      Right Left   Dist [place] 20/40 -1 20/50 -2   Dist ph [place] NI 20/50 +2       Tonometry (Tonopen, 8:47 AM)      Right Left   Pressure 14 15       Pupils      Dark Light Shape React APD   Right 4 3 Round Brisk None   Left 4 3 Round Brisk None       Visual Fields (Counting fingers)      Left Right    Full Full       Extraocular Movement      Right Left    Full Full       Neuro/Psych    Oriented x3: Yes   Mood/Affect: Normal       Dilation    Both eyes: 1.0% Mydriacyl, 2.5% Phenylephrine @ 8:47 AM        Slit Lamp and Fundus Exam    Slit Lamp Exam      Right Left   Lids/Lashes Normal Normal   Conjunctiva/Sclera White and quiet White and quiet   Cornea Trace Punctate epithelial erosions Trace Punctate epithelial erosions   Anterior Chamber Deep and quiet Deep and quiet   Iris Round and dilated, No NVI Round and dilated, no NVI   Lens 1+ Nuclear sclerosis, 1-2+ Cortical cataract, 2+Posterior subcapsular cataract PC IOL in good position   Vitreous VH improved, scattered fibrosis superior to disc, blood stained vitreous condensations turning white Syneresis, blood stained vitreous condensations turning white       Fundus Exam      Right Left   Disc 2-3+Pallor, sharp rim, fibrosis Mild Pallor, Sharp rim, Compact, +fibrosis/regressing NVD, +heme   C/D Ratio 0.1 0.2   Macula Blunted foveal reflex, +fibrosis, +pre-retinal heme, +MA Flat, blunted foveal reflex, atrophic, flat  choroidal nevus superior macula, pre-retinal heme inferior macula   Vessels Vascular attenuation, Tortuous, +fibrosis, +NV inferior to disc Sclerotic arterioles, Tortuous, +NV, fibrosis along superior arcades, severe Vascular attenuation   Periphery Attached, 360 PRP, tractional fibrosis superior and along arcades,  greatest above disc Attached; good 360 PRP in place, scattered tractional fibrosis          IMAGING AND PROCEDURES  Imaging and Procedures for @TODAY @  OCT, Retina - OU - Both Eyes       Right Eye Quality was good. Central Foveal Thickness: 351. Progression has improved. Findings include abnormal foveal contour, intraretinal hyper-reflective material, preretinal fibrosis, vitreous traction, epiretinal membrane, no SRF, macular pucker, intraretinal fluid, inner retinal atrophy (Mild Interval improvement in vitreous opacities ).   Left Eye Quality was good. Central Foveal Thickness: 231. Progression has improved. Findings include abnormal foveal contour, intraretinal hyper-reflective material, no SRF, inner retinal atrophy, epiretinal membrane, preretinal fibrosis, outer retinal atrophy, vitreomacular adhesion , intraretinal fluid (Mild interval improvement in vitreous opacities ).   Notes *Images captured and stored on drive  Diagnosis / Impression:  PDR w/ tractional fibrosis OU OD: Mild Interval improvement in vitreous opacities OS: Mild interval improvement in vitreous opacities    Clinical management:  See below  Abbreviations: NFP - Normal foveal profile. CME - cystoid macular edema. PED - pigment epithelial detachment. IRF - intraretinal fluid. SRF - subretinal fluid. EZ - ellipsoid zone. ERM - epiretinal membrane. ORA - outer retinal atrophy. ORT - outer retinal tubulation. SRHM - subretinal hyper-reflective material         Fluorescein Angiography Optos (Transit OD)       Right Eye   Progression has no prior data. Early phase findings include staining,  vascular perfusion defect. Mid/Late phase findings include staining, vascular perfusion defect, retinal neovascularization, leakage.   Left Eye   Progression has no prior data. Early phase findings include neovascularization disc, retinal neovascularization, vascular perfusion defect. Mid/Late phase findings include neovascularization disc, retinal neovascularization, vascular perfusion defect.   Notes **Images stored on drive**  Impression: PDR OU Large patches of vascular non-perfusion OU (OS>OD) +NV OU Late, leaking MA OU        Intravitreal Injection, Pharmacologic Agent - OD - Right Eye       Time Out 03/23/2020. 10:18 AM. Confirmed correct patient, procedure, site, and patient consented.   Anesthesia Topical anesthesia was used. Anesthetic medications included Lidocaine 2%, Proparacaine 0.5%.   Procedure Preparation included 5% betadine to ocular surface, eyelid speculum. A supplied needle was used.   Injection:  1.25 mg Bevacizumab (AVASTIN) SOLN   NDC: 29476-546-50, Lot: [id number] , Expiration date: 04/21/2020   Route: Intravitreal, Site: Right Eye, Waste: 0 mL  Post-op Post injection exam found visual acuity of at least counting fingers. The patient tolerated the procedure well. There were no complications. The patient received written and verbal post procedure care education.        Intravitreal Injection, Pharmacologic Agent - OS - Left Eye       Time Out 03/23/2020. 10:19 AM. Confirmed correct patient, procedure, site, and patient consented.   Anesthesia Topical anesthesia was used. Anesthetic medications included Lidocaine 2%, Proparacaine 0.5%.   Procedure Preparation included 5% betadine to ocular surface, eyelid speculum. A (32g) needle was used.   Injection:  1.25 mg Bevacizumab (AVASTIN) SOLN   NDC: 23/04/2020, Lot: [id number] , Expiration date: 04/21/2020   Route: Intravitreal, Site: Left Eye, Waste: 0 mL  Post-op Post injection exam  found visual acuity of at least counting fingers. The patient tolerated the procedure well. There were no complications. The patient received written and verbal post procedure care education.                 ASSESSMENT/PLAN:  ICD-10-CM   1. Proliferative diabetic retinopathy of both eyes with macular edema associated with type 2 diabetes mellitus (HCC)  H84.6962 Intravitreal Injection, Pharmacologic Agent - OD - Right Eye    Intravitreal Injection, Pharmacologic Agent - OS - Left Eye    Bevacizumab (AVASTIN) SOLN 1.25 mg    Bevacizumab (AVASTIN) SOLN 1.25 mg  2. Vitreous hemorrhage of both eyes ([place])  H43.13   3. Retinal edema  H35.81 OCT, Retina - OU - Both Eyes  4. Essential hypertension  I10   5. Hypertensive retinopathy of both eyes  H35.033 Fluorescein Angiography Optos (Transit OD)  6. Ocular hypertension, bilateral  H40.053   7. Acute CVA (cerebrovascular accident) ([place])  I63.9   8. Combined forms of age-related cataract of right eye  H25.811   9. Pseudophakia  Z96.1    1-3. Proliferative diabetic retinopathy OU  - delayed f/u from 4 wks to 8 wks  - s/p PRP OD (10.02.19), fill-in (01.22.20), fill in OD (02.12.20)   - s/p PRP OS (05/02/2018), fill-in (02.19.20)  - s/p IVK #1 OD (01.22.20), OS (02.12.20)  - s/p IVA OD #1 (10.21.20), #2 (12.02.20), #3 (01.13.21), #4 (02.10.21), #5 (04.19.21), #6 (05.21.21), #7 (06.21.21), #8 (08.19.21)  - s/p IVA OS #1 (12.02.20), #2 (04.19.21), #3 (05.21.21), #4 (06.21.21), #5 (08.19.21)  - FA 8.19.19 shows leaking MA, patches of capillary nonperfusion OU  - pt was lost to f/u from 02.19.2020 to 10.21.20  - discussed the importance of compliance with medical therapy and follow ups  - presented acutely (10.21.2020) for decreased vision and new onset floaters OD  - today, pt reports subjective improvement in vision OU -- had cataract surgery OS w/ Dr. Kathlen Mody  - BCVA stable at 20/40 OD; OS stable at 20/50   - exam shows interval decrease in  VH, preretinal and subhyaloid hemorrhage, persistent preretinal fibrosis OU  - OCT shows Mild Interval improvement in vitreous opacities OU  - repeat FA 10.11.21 shows persistent NV OU, large patches of vascular nonperfusion OU (OS>OD) -- would benefit from some fill in PRP  - VH precautions reviewed -- minimize activities, keep head elevated, avoid ASA/NSAIDs/blood thinners as able  - pt now on heparin  - discussed possibility of surgery being needed in the future to stabilize eye and prevent further progression of diabetic eye disease  - recommend IVA OD #9 and OS #6 today, 10.11.21  - pt wishes to proceed  - RBA of procedure discussed, questions answered  - informed consent obtained   - Avastin informed consent form signed and scanned on 01.13.2021  - see procedure note  - f/u 4 weeks -- DFE/OCT, possible injection  4,5. 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 Hinton [name] in mid August 2019 with a 2 wk history intractable headaches and decreased vision OU  - found to have BP of 170s/90s -- sent to ED and was admitted to [facility] where SBP was up to 200  - discussed importance of tight BP control  - pt reports improved compliance with BP medications and improvement in BP  6. 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 Limited 2 sequence MRI head: Acute RIGHT periatrial subcentimeter  nonhemorrhagic infarct, in region of optic tract.  - could be limiting vision -- will check formal visual field once macular edema improves  7. Ocular Hypertention OU  - today, IOP OD 14, OS 15  - pt reports not taking  Cosopt BID OU for about a month -- IOP okay  8. Mixed cataract OD - The symptoms of cataract, surgical options, and treatments and risks were discussed with patient. - discussed diagnosis and progression - under the expert management of Dr. Kathlen Mody  9. Pseudophakia OS  - s/p CE/IOL (07.29.21, Dr. Kathlen Mody)  - IOL  in good position, doing well - monitor   Ophthalmic Meds Ordered this visit:  Meds ordered this encounter  Medications  . Bevacizumab (AVASTIN) SOLN 1.25 mg  . Bevacizumab (AVASTIN) SOLN 1.25 mg       Return in about 4 weeks (around 04/20/2020) for Dilated Exam, OCT.  There are no Patient Instructions on file for this visit.  This document serves as a record of services personally performed by Gardiner Sleeper, MD, PhD. It was created on their behalf by Leeann Must, [place], an ophthalmic technician. The creation of this record is the provider's dictation and/or activities during the visit.    Electronically signed by: Leeann Must, [place] 10.07.2021 2:06 PM   This document serves as a record of services personally performed by Gardiner Sleeper, MD, PhD. It was created on their behalf by [place] Jet[place]. Owens Shark, OA an ophthalmic technician. The creation of this record is the provider's dictation and/or activities during the visit.    Electronically signed by: San Jetty. Owens Shark, [place] 10.11.2021 2:06 PM  Gardiner Sleeper, M.D., Ph.D. Diseases & Surgery of the Retina and [place] 03/23/2020   I have reviewed the above documentation for accuracy and completeness, and I agree with the above. Gardiner Sleeper, M.D., Ph.D. 03/23/20 2:06 PM   Abbreviations: M myopia (nearsighted); A astigmatism; H hyperopia (farsighted); P presbyopia; Mrx spectacle prescription;  CTL contact lenses; OD right eye; OS left eye; OU both eyes  XT exotropia; ET esotropia; PEK punctate epithelial keratitis; PEE punctate epithelial erosions; DES dry eye syndrome; MGD meibomian gland dysfunction; ATs artificial tears; PFAT's preservative free artificial tears; [place] nuclear sclerotic cataract; PSC posterior subcapsular cataract; ERM epi-retinal membrane; PVD posterior vitreous detachment; RD retinal detachment; DM diabetes mellitus; DR diabetic retinopathy; NPDR non-proliferative diabetic retinopathy;  PDR proliferative diabetic retinopathy; CSME clinically significant macular edema; DME diabetic macular edema; dbh dot blot hemorrhages; CWS cotton wool spot; POAG primary open angle glaucoma; C/D cup-to-disc ratio; HVF humphrey visual field; GVF goldmann visual field; OCT optical coherence tomography; IOP intraocular pressure; BRVO Branch retinal vein occlusion; CRVO central retinal vein occlusion; CRAO central retinal artery occlusion; BRAO branch retinal artery occlusion; RT retinal tear; SB scleral buckle; PPV pars plana vitrectomy; VH Vitreous hemorrhage; PRP panretinal laser photocoagulation; IVK intravitreal kenalog; VMT vitreomacular traction; MH Macular hole;  NVD neovascularization of the disc; NVE neovascularization elsewhere; AREDS age related eye disease study; ARMD age related macular degeneration; POAG primary open angle glaucoma; EBMD epithelial/anterior basement membrane dystrophy; ACIOL anterior chamber intraocular lens; IOL intraocular lens; PCIOL posterior chamber intraocular lens; Phaco/IOL phacoemulsification with intraocular lens placement; [place] photorefractive keratectomy; LASIK laser assisted in situ keratomileusis; HTN hypertension; DM diabetes mellitus; COPD chronic obstructive pulmonary disease

## 2020-03-23 ENCOUNTER — Ambulatory Visit (INDEPENDENT_AMBULATORY_CARE_PROVIDER_SITE_OTHER): Payer: 59 | Admitting: Ophthalmology

## 2020-03-23 ENCOUNTER — Other Ambulatory Visit: Payer: Self-pay

## 2020-03-23 ENCOUNTER — Encounter (INDEPENDENT_AMBULATORY_CARE_PROVIDER_SITE_OTHER): Payer: Self-pay | Admitting: Ophthalmology

## 2020-03-23 DIAGNOSIS — H4313 Vitreous hemorrhage, bilateral: Secondary | ICD-10-CM | POA: Diagnosis not present

## 2020-03-23 DIAGNOSIS — I639 Cerebral infarction, unspecified: Secondary | ICD-10-CM

## 2020-03-23 DIAGNOSIS — I10 Essential (primary) hypertension: Secondary | ICD-10-CM

## 2020-03-23 DIAGNOSIS — E113513 Type 2 diabetes mellitus with proliferative diabetic retinopathy with macular edema, bilateral: Secondary | ICD-10-CM | POA: Diagnosis not present

## 2020-03-23 DIAGNOSIS — H40053 Ocular hypertension, bilateral: Secondary | ICD-10-CM

## 2020-03-23 DIAGNOSIS — H35033 Hypertensive retinopathy, bilateral: Secondary | ICD-10-CM | POA: Diagnosis not present

## 2020-03-23 DIAGNOSIS — H3581 Retinal edema: Secondary | ICD-10-CM

## 2020-03-23 DIAGNOSIS — H25811 Combined forms of age-related cataract, right eye: Secondary | ICD-10-CM

## 2020-03-23 DIAGNOSIS — Z961 Presence of intraocular lens: Secondary | ICD-10-CM

## 2020-03-23 MED ORDER — BEVACIZUMAB CHEMO INJECTION 1.25MG/0.05ML SYRINGE FOR KALEIDOSCOPE
1.2500 mg | INTRAVITREAL | Status: AC | PRN
  Administered 2020-03-23: 1.25 mg via INTRAVITREAL

## 2020-03-27 ENCOUNTER — Encounter: Payer: Self-pay | Admitting: Internal Medicine

## 2020-03-27 ENCOUNTER — Ambulatory Visit (INDEPENDENT_AMBULATORY_CARE_PROVIDER_SITE_OTHER): Payer: 59 | Admitting: Internal Medicine

## 2020-03-27 ENCOUNTER — Other Ambulatory Visit: Payer: Self-pay

## 2020-03-27 VITALS — BP 120/60 | HR 82 | Temp 98.7°F | Ht 71.0 in | Wt 186.0 lb

## 2020-03-27 DIAGNOSIS — E1169 Type 2 diabetes mellitus with other specified complication: Secondary | ICD-10-CM | POA: Diagnosis not present

## 2020-03-27 DIAGNOSIS — E785 Hyperlipidemia, unspecified: Secondary | ICD-10-CM

## 2020-03-27 DIAGNOSIS — E1159 Type 2 diabetes mellitus with other circulatory complications: Secondary | ICD-10-CM | POA: Diagnosis not present

## 2020-03-27 DIAGNOSIS — I10 Essential (primary) hypertension: Secondary | ICD-10-CM | POA: Diagnosis not present

## 2020-03-27 DIAGNOSIS — I5032 Chronic diastolic (congestive) heart failure: Secondary | ICD-10-CM

## 2020-03-27 LAB — POCT GLYCOSYLATED HEMOGLOBIN (HGB A1C): Hemoglobin A1C: 6.1 % — AB (ref 4.0–5.6)

## 2020-03-27 NOTE — Patient Instructions (Signed)
-  Nice seeing you today!!  -Come back for your physical as scheduled for beginning of the year.

## 2020-03-27 NOTE — Progress Notes (Signed)
Established Patient Office Visit     This visit occurred during the SARS-CoV-2 public health emergency.  Safety protocols were in place, including screening questions prior to the visit, additional usage of staff PPE, and extensive cleaning of exam room while observing appropriate contact time as indicated for disinfecting solutions.    CC/Reason for Visit: Discuss chronic conditions  HPI: Bill [name] is a 39 y.o. male who is coming in today for the above mentioned reasons. Past Medical History is significant for:   End-stage renal disease on hemodialysis on a Tuesday, Thursday, Saturday schedule, type 2 diabetes with significant retinopathy currently receiving injections by Dr. Coralyn Pear, prior history of nonhemorrhagic CVA, history of well-controlled hypertension with hypotensive events during dialysis per report, history of heart failure with preserved ejection fraction.  He has been off all blood pressure and diabetes medication due to hypotension and hypoglycemic episodes.  He was started on Zoloft for anxiety last year and is only taking it as needed.  He still has significant anxiety symptoms.  He continues to have issues with erectile dysfunction.  He was referred to urology who recommended penile injections which he has not done.  He received his flu vaccine and his Covid booster last month at the dialysis center.   Past Medical/Surgical History: Past Medical History:  Diagnosis Date  . Anemia   . Anxiety    situational  . Diabetes mellitus without complication ([place])   . Diabetic retinopathy ([place])    PDR OU  . ESRD on dialysis [facility])    Dialyzing T-Th-S  . History of blood transfusion   . Hypertension   . PONV (postoperative nausea and vomiting)    03/22/2019- only 15 years ago  . Renal disorder   . Stroke [facility]) 2019   vision    Past Surgical History:  Procedure Laterality Date  . A/V FISTULAGRAM N/A 03/11/2019   Procedure: A/V FISTULAGRAM - Left Arm;  Surgeon:  Waynetta Sandy, MD;  Location: [place] [facility];  Service: Cardiovascular;  Laterality: N/A;  . arm surgery Right    Fracture  . AV FISTULA PLACEMENT Left 01/25/2019   Procedure: ARTERIOVENOUS (AV) FISTULA CREATION  LEFT UPPER  ARM;  Surgeon: Serafina Mitchell, MD;  Location: [place];  Service: Vascular;  Laterality: Left;  . BASCILIC VEIN TRANSPOSITION Left 03/27/2019   Procedure: SECOND STAGE BASILIC VEIN TRANSPOSITION LEFT ARM;  Surgeon: Serafina Mitchell, MD;  Location: [place];  Service: Vascular;  Laterality: Left;  . WISDOM TOOTH EXTRACTION      Social History:  reports that he has never smoked. He has never used smokeless tobacco. He reports previous alcohol use. He reports previous drug use.  Allergies: Allergies  Allergen Reactions  . Food     Melons-itchy/headaches    Family History:  Family History  Problem Relation Age of Onset  . Diabetes Mellitus II Mother   . Hypertension Mother   . Cataracts Mother   . Diabetes Mellitus II Father   . Stroke Father   . Hypertension Father   . Glaucoma Maternal Grandmother   . Amblyopia Neg Hx   . Blindness Neg Hx   . Macular degeneration Neg Hx   . Retinal detachment Neg Hx   . Strabismus Neg Hx   . Retinitis pigmentosa Neg Hx      Current Outpatient Medications:  .  aspirin (BAYER ASPIRIN EC LOW DOSE) 81 MG EC tablet, Take 81 mg by mouth as needed., Disp: , Rfl:  .  AURYXIA 1 GM 210 MG(Fe) tablet, Take 210 mg by mouth 3 (three) times daily with meals., Disp: , Rfl:  .  ferric citrate (AURYXIA) 1 GM 210 MG(Fe) tablet, Take 210 mg by mouth as directed., Disp: , Rfl:  .  fluticasone (FLONASE) 50 MCG/ACT nasal spray, Place 1 spray into both nostrils daily as needed for allergies or rhinitis., Disp: , Rfl:  .  multivitamin (RENA-VIT) TABS tablet, Take 1 tablet by mouth daily., Disp: , Rfl:  .  sertraline (ZOLOFT) 50 MG tablet, TAKE 1 TABLET(50 MG) BY MOUTH DAILY, Disp: 30 tablet, Rfl: 0  Review of Systems:    Constitutional: Denies fever, chills, diaphoresis, appetite change and fatigue.  HEENT: Denies photophobia, eye pain, redness, hearing loss, ear pain, congestion, sore throat, rhinorrhea, sneezing, mouth sores, trouble swallowing, neck pain, neck stiffness and tinnitus.   Respiratory: Denies SOB, DOE, cough, chest tightness,  and wheezing.   Cardiovascular: Denies chest pain, palpitations and leg swelling.  Gastrointestinal: Denies nausea, vomiting, abdominal pain, diarrhea, constipation, blood in stool and abdominal distention.  Genitourinary: Denies dysuria, urgency, frequency, hematuria, flank pain and difficulty urinating.  Endocrine: Denies: hot or cold intolerance, sweats, changes in hair or nails, polyuria, polydipsia. Musculoskeletal: Denies myalgias, back pain, joint swelling, arthralgias and gait problem.  Skin: Denies pallor, rash and wound.  Neurological: Denies dizziness, seizures, syncope, weakness, light-headedness, numbness and headaches.  Hematological: Denies adenopathy. Easy bruising, personal or family bleeding history  Psychiatric/Behavioral: Denies suicidal ideation, mood changes, confusion, nervousness, sleep disturbance and agitation    Physical Exam: Vitals:   03/27/20 0938  BP: 120/60  Pulse: 82  Temp: 98.7 F (37.1 C)  TempSrc: Oral  SpO2: 99%  Weight: 186 lb (84.4 kg)  Height: 5\' 11"  (1.803 m)    Body mass index is 25.94 kg/m.   Constitutional: NAD, calm, comfortable Eyes: PERRL, lids and conjunctivae normal ENMT: Mucous membranes are moist.  Respiratory: clear to auscultation bilaterally, no wheezing, no crackles. Normal respiratory effort. No accessory muscle use.  Cardiovascular: Regular rate and rhythm, no murmurs / rubs / gallops. No extremity edema.   Neurologic: Grossly intact and nonfocal Psychiatric: Normal judgment and insight. Alert and oriented x 3. Normal mood.    Impression and Plan:  Hyperlipidemia associated with type 2 diabetes  mellitus ([place]) -Last LDL was 94 in April 2020, he is on max dose atorvastatin. -Check lipids when he comes back fasting in 2 months.  Chronic heart failure with preserved ejection fraction (HCC) -Compensated, on aspirin, statin. -No beta-blocker or ACE inhibitor due to hypotension with dialysis.  Primary hypertension -Blood pressures well controlled today, not on medications as he has a history of low blood pressures with hemodialysis.  Type 2 diabetes mellitus with vascular disease (HCC) -A1c today is 6.1, okay to observe off medications given history of hypoglycemia.    Patient Instructions  -Nice seeing you today!!  -Come back for your physical as scheduled for beginning of the year.     Lelon Frohlich, MD [facility]

## 2020-04-20 ENCOUNTER — Encounter (INDEPENDENT_AMBULATORY_CARE_PROVIDER_SITE_OTHER): Payer: 59 | Admitting: Ophthalmology

## 2020-04-20 DIAGNOSIS — Z9119 Patient's noncompliance with other medical treatment and regimen: Secondary | ICD-10-CM

## 2020-04-20 DIAGNOSIS — H4313 Vitreous hemorrhage, bilateral: Secondary | ICD-10-CM

## 2020-04-20 DIAGNOSIS — H25811 Combined forms of age-related cataract, right eye: Secondary | ICD-10-CM

## 2020-04-20 DIAGNOSIS — H35033 Hypertensive retinopathy, bilateral: Secondary | ICD-10-CM

## 2020-04-20 DIAGNOSIS — Z961 Presence of intraocular lens: Secondary | ICD-10-CM

## 2020-04-20 DIAGNOSIS — E113412 Type 2 diabetes mellitus with severe nonproliferative diabetic retinopathy with macular edema, left eye: Secondary | ICD-10-CM

## 2020-04-20 DIAGNOSIS — E113511 Type 2 diabetes mellitus with proliferative diabetic retinopathy with macular edema, right eye: Secondary | ICD-10-CM

## 2020-04-20 DIAGNOSIS — H25813 Combined forms of age-related cataract, bilateral: Secondary | ICD-10-CM

## 2020-04-20 DIAGNOSIS — I639 Cerebral infarction, unspecified: Secondary | ICD-10-CM

## 2020-04-20 DIAGNOSIS — I10 Essential (primary) hypertension: Secondary | ICD-10-CM

## 2020-04-20 DIAGNOSIS — E113513 Type 2 diabetes mellitus with proliferative diabetic retinopathy with macular edema, bilateral: Secondary | ICD-10-CM

## 2020-04-20 DIAGNOSIS — H40053 Ocular hypertension, bilateral: Secondary | ICD-10-CM

## 2020-04-20 DIAGNOSIS — H3581 Retinal edema: Secondary | ICD-10-CM

## 2020-04-20 DIAGNOSIS — E113313 Type 2 diabetes mellitus with moderate nonproliferative diabetic retinopathy with macular edema, bilateral: Secondary | ICD-10-CM

## 2020-04-30 NOTE — Progress Notes (Signed)
[facility] Note  05/11/2020     CHIEF COMPLAINT Patient presents for Retina Follow Up   HISTORY OF PRESENT ILLNESS: Bill [name] is a 39 y.o. male who presents to the clinic today for:   HPI    Retina Follow Up    Patient presents with  Diabetic Retinopathy.  In both eyes.  Duration of 7 weeks.  Since onset it is stable.  I, the attending physician,  performed the HPI with the patient and updated documentation appropriately.          Comments    7 week follow up PDR OU-  Vision stable OU. BS: 170's when at dialysis Saturday A1C: 6.1       Last edited by [name], MD on 05/11/2020 12:12 PM. (History)    pt  Referring physician: [name], MD [place],  [place] [id number]  HISTORICAL INFORMATION:   Selected notes from the MEDICAL RECORD NUMBER Referred by Dr. [name] for concern of severe NPDR / PDR OU; LEE-  Ocular Hx-  PMH-     CURRENT MEDICATIONS: No current outpatient medications on file. (Ophthalmic Drugs)   No current facility-administered medications for this visit. (Ophthalmic Drugs)   Current Outpatient Medications (Other)  Medication Sig  . aspirin (BAYER ASPIRIN EC LOW DOSE) 81 MG EC tablet Take 81 mg by mouth as needed.  [name] 1 GM 210 MG(Fe) tablet Take 210 mg by mouth 3 (three) times daily with meals.  . ferric citrate (AURYXIA) 1 GM 210 MG(Fe) tablet Take 210 mg by mouth as directed.  . fluticasone (FLONASE) 50 MCG/ACT nasal spray Place 1 spray into both nostrils daily as needed for allergies or rhinitis.  . multivitamin (RENA-VIT) TABS tablet Take 1 tablet by mouth daily.  . sertraline (ZOLOFT) 50 MG tablet TAKE 1 TABLET(50 MG) BY MOUTH DAILY   No current facility-administered medications for this visit. (Other)      REVIEW OF SYSTEMS: ROS    Positive for: Genitourinary, Endocrine, Eyes, Psychiatric   Negative for: Constitutional, Gastrointestinal, Neurological, Skin,  Musculoskeletal, HENT, Cardiovascular, Respiratory, Allergic/Imm, Heme/Lymph   Last edited by Leonie Douglas, COA on 05/11/2020  9:38 AM. (History)       ALLERGIES Allergies  Allergen Reactions  . Food     Melons-itchy/headaches    PAST MEDICAL HISTORY Past Medical History:  Diagnosis Date  . Anemia   . Anxiety    situational  . Diabetes mellitus without complication ([place])   . Diabetic retinopathy ([place])    PDR OU  . ESRD on dialysis [facility])    Dialyzing T-Th-S  . History of blood transfusion   . Hypertension   . PONV (postoperative nausea and vomiting)    03/22/2019- only 15 years ago  . Renal disorder   . Stroke [facility]) 2019   vision   Past Surgical History:  Procedure Laterality Date  . A/V FISTULAGRAM N/A 03/11/2019   Procedure: A/V FISTULAGRAM - Left Arm;  Surgeon: Waynetta Sandy, MD;  Location: [place] [facility];  Service: Cardiovascular;  Laterality: N/A;  . arm surgery Right    Fracture  . AV FISTULA PLACEMENT Left 01/25/2019   Procedure: ARTERIOVENOUS (AV) FISTULA CREATION  LEFT UPPER  ARM;  Surgeon: Serafina Mitchell, MD;  Location: [place];  Service: Vascular;  Laterality: Left;  . BASCILIC VEIN TRANSPOSITION Left 03/27/2019   Procedure: SECOND STAGE BASILIC VEIN TRANSPOSITION LEFT ARM;  Surgeon: Serafina Mitchell,  MD;  Location: [place];  Service: Vascular;  Laterality: Left;  . WISDOM TOOTH EXTRACTION      FAMILY HISTORY Family History  Problem Relation Age of Onset  . Diabetes Mellitus II Mother   . Hypertension Mother   . Cataracts Mother   . Diabetes Mellitus II Father   . Stroke Father   . Hypertension Father   . Glaucoma Maternal Grandmother   . Amblyopia Neg Hx   . Blindness Neg Hx   . Macular degeneration Neg Hx   . Retinal detachment Neg Hx   . Strabismus Neg Hx   . Retinitis pigmentosa Neg Hx     SOCIAL HISTORY Social History   Tobacco Use  . Smoking status: Never Smoker  . Smokeless tobacco: Never Used  Vaping Use  . Vaping  Use: Never used  Substance Use Topics  . Alcohol use: Not Currently  . Drug use: Not Currently         OPHTHALMIC EXAM:  Base Eye Exam    Visual Acuity (Snellen - Linear)      Right Left   Dist [place] 20/60 -2 20/50 +2   Dist ph [place] 20/50 NI       Tonometry (Tonopen, 9:47 AM)      Right Left   Pressure 16 15       Pupils      Dark Light Shape React APD   Right 4 3 Round Brisk None   Left 4 3 Round Brisk None       Visual Fields (Counting fingers)      Left Right    Full    Restrictions  Partial outer inferior nasal deficiency       Extraocular Movement      Right Left    Full Full       Neuro/Psych    Oriented x3: Yes   Mood/Affect: Normal       Dilation    Both eyes: 1.0% Mydriacyl, 2.5% Phenylephrine @ 9:47 AM        Slit Lamp and Fundus Exam    Slit Lamp Exam      Right Left   Lids/Lashes Normal Normal   Conjunctiva/Sclera White and quiet White and quiet   Cornea Trace Punctate epithelial erosions Trace Punctate epithelial erosions   Anterior Chamber Deep and quiet Deep and quiet   Iris Round and dilated, No NVI Round and dilated, no NVI   Lens 1+ Nuclear sclerosis, 1-2+ Cortical cataract, 2+Posterior subcapsular cataract PC IOL in good position   Vitreous VH improved, scattered fibrosis superior to disc, blood stained vitreous condensations turning white, old blood clots settled inferiorly and turning white Syneresis, blood stained vitreous condensations turning white, blood clots settled inferiorly -- still red       Fundus Exam      Right Left   Disc 2-3+Pallor, sharp rim, fibrosis 2-3+Pallor, Sharp rim, Compact, +fibrosis/regressing NVD, +heme   C/D Ratio 0.1 0.2   Macula Blunted foveal reflex, +fibrosis, +exudates/IRH, +MA Flat, blunted foveal reflex, atrophic, flat choroidal nevus superior macula, pre-retinal heme inferior macula   Vessels Severe attenuation, Tortuous, +fibrosis, +NV inferior to disc Sclerotic arterioles, Tortuous, +NV, fibrosis  along superior arcades, severe Vascular attenuation   Periphery Attached, 360 PRP, tractional fibrosis superior and along arcades, greatest above disc Attached; good 360 PRP in place, scattered tractional fibrosis          IMAGING AND PROCEDURES  Imaging and Procedures for @TODAY @  OCT, Retina -  OU - Both Eyes       Right Eye Quality was good. Central Foveal Thickness: 366. Progression has worsened. Findings include abnormal foveal contour, intraretinal hyper-reflective material, preretinal fibrosis, vitreous traction, epiretinal membrane, no SRF, macular pucker, intraretinal fluid, inner retinal atrophy (Mild Interval increase in IRF/cystic changes).   Left Eye Quality was good. Central Foveal Thickness: 227. Progression has been stable. Findings include abnormal foveal contour, intraretinal hyper-reflective material, no SRF, inner retinal atrophy, epiretinal membrane, preretinal fibrosis, outer retinal atrophy, vitreomacular adhesion , no IRF, vitreous traction (Mild interval improvement in vitreous opacities ).   Notes *Images captured and stored on drive  Diagnosis / Impression:  PDR w/ tractional fibrosis OU OD: Mild Interval increase in IRF/cystic changes OS: Mild interval improvement in vitreous opacities    Clinical management:  See below  Abbreviations: NFP - Normal foveal profile. CME - cystoid macular edema. PED - pigment epithelial detachment. IRF - intraretinal fluid. SRF - subretinal fluid. EZ - ellipsoid zone. ERM - epiretinal membrane. ORA - outer retinal atrophy. ORT - outer retinal tubulation. SRHM - subretinal hyper-reflective material                  ASSESSMENT/PLAN:   ICD-10-CM   1. Proliferative diabetic retinopathy of both eyes with macular edema associated with type 2 diabetes mellitus ([place])  R42.7062   2. Vitreous hemorrhage of both eyes ([place])  H43.13   3. Retinal edema  H35.81 OCT, Retina - OU - Both Eyes  4. Essential hypertension  I10   5.  Hypertensive retinopathy of both eyes  H35.033   6. Ocular hypertension, bilateral  H40.053   7. Acute CVA (cerebrovascular accident) ([place])  I63.9   8. Combined forms of age-related cataract of right eye  H25.811   9. Pseudophakia  Z96.1    1-3. Proliferative diabetic retinopathy OU  - delayed f/u from 4 wks to 8 wks  - s/p PRP OD (10.02.19), fill-in (01.22.20), fill in OD (02.12.20)   - s/p PRP OS (05/02/2018), fill-in (02.19.20)  - s/p IVK #1 OD (01.22.20), OS (02.12.20)  - s/p IVA OD #1 (10.21.20), #2 (12.02.20), #3 (01.13.21), #4 (02.10.21), #5 (04.19.21), #6 (05.21.21), #7 (06.21.21), #8 (08.19.21), #9 (10.11.21)  - s/p IVA OS #1 (12.02.20), #2 (04.19.21), #3 (05.21.21), #4 (06.21.21), #5 (08.19.21), #6 (10.11.21)  - FA 8.19.19 shows leaking MA, patches of capillary nonperfusion OU  - pt was lost to f/u from 02.19.2020 to 10.21.20  - discussed the importance of compliance with medical therapy and follow ups  - presented acutely (10.21.2020) for decreased vision and new onset floaters OD  - BCVA 20/50 OU  - exam shows interval decrease in VH, preretinal and subhyaloid hemorrhage, persistent preretinal fibrosis OU  - OCT shows OD: Mild Interval increase in IRF/cystic changes; OS: Mild interval improvement in vitreous opacities  - repeat FA 10.11.21 shows persistent NV OU, large patches of vascular nonperfusion OU (OS>OD) -- would benefit from some fill in PRP  - VH precautions reviewed -- minimize activities, keep head elevated, avoid ASA/NSAIDs/blood thinners as able  - pt now on heparin  - discussed possibility of surgery being needed in the future to stabilize eye and prevent further progression of diabetic eye disease  - recommend IVA OD #10 and OS #7 today, 11.29.21 -- pt unable to receive injections today  - pt wishes to come back on Wednesday for injections due to transportation  - [place] informed consent form signed and scanned on 01.13.2021  - f/u  Wednesday, December 1 -- IVA  OU  4,5. 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 [name] in mid August 2019 with a 2 wk history intractable headaches and decreased vision OU  - found to have BP of 170s/90s -- sent to ED and was admitted to [facility] where SBP was up to 200  - discussed importance of tight BP control  - pt reports improved compliance with BP medications and improvement in BP  6. 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 Limited 2 sequence MRI head: Acute RIGHT periatrial subcentimeter  nonhemorrhagic infarct, in region of optic tract.  - could be limiting vision -- will check formal visual field once macular edema improves  7. Ocular Hypertention OU  - today, IOP OD 16, OS 15  - pt reports not taking Cosopt BID OU for about a month -- IOP okay  8. Mixed cataract OD - The symptoms of cataract, surgical options, and treatments and risks were discussed with patient. - discussed diagnosis and progression - under the expert management of Dr. Kathlen Mody - clear from a retina standpoint to proceed with cataract surgery when pt and surgeon are ready   9. Pseudophakia OS  - s/p CE/IOL (07.29.21, Dr. Kathlen Mody)  - IOL in good position, doing well - monitor   Ophthalmic Meds Ordered this visit:  No orders of the defined types were placed in this encounter.      Return in about 2 days (around 05/13/2020) for f/u PDR OU, DFE, OCT.  There are no Patient Instructions on file for this visit.  This document serves as a record of services personally performed by Gardiner Sleeper, MD, PhD. It was created on their behalf by Leeann Must, [place], an ophthalmic technician. The creation of this record is the provider's dictation and/or activities during the visit.    Electronically signed by: Leeann Must, COA @TODAY @ 12:16 PM  Gardiner Sleeper, M.D., Ph.D. Diseases & Surgery of the Retina and [place] 05/11/2020   I have reviewed the above documentation for accuracy and completeness, and I agree with the above. Gardiner Sleeper, M.D., Ph.D. 05/11/20 12:16 PM   Abbreviations: M myopia (nearsighted); A astigmatism; H hyperopia (farsighted); P presbyopia; Mrx spectacle prescription;  CTL contact lenses; OD right eye; OS left eye; OU both eyes  XT exotropia; ET esotropia; PEK punctate epithelial keratitis; PEE punctate epithelial erosions; DES dry eye syndrome; MGD meibomian gland dysfunction; ATs artificial tears; PFAT's preservative free artificial tears; [place] nuclear sclerotic cataract; PSC posterior subcapsular cataract; ERM epi-retinal membrane; PVD posterior vitreous detachment; RD retinal detachment; DM diabetes mellitus; DR diabetic retinopathy; NPDR non-proliferative diabetic retinopathy; PDR proliferative diabetic retinopathy; CSME clinically significant macular edema; DME diabetic macular edema; dbh dot blot hemorrhages; CWS cotton wool spot; POAG primary open angle glaucoma; C/D cup-to-disc ratio; HVF humphrey visual field; GVF goldmann visual field; OCT optical coherence tomography; IOP intraocular pressure; BRVO Branch retinal vein occlusion; CRVO central retinal vein occlusion; CRAO central retinal artery occlusion; BRAO branch retinal artery occlusion; RT retinal tear; SB scleral buckle; PPV pars plana vitrectomy; VH Vitreous hemorrhage; PRP panretinal laser photocoagulation; IVK intravitreal kenalog; VMT vitreomacular traction; MH Macular hole;  NVD neovascularization of the disc; NVE neovascularization elsewhere; AREDS age related eye disease study; ARMD age related macular degeneration; POAG primary open angle glaucoma; EBMD epithelial/anterior basement membrane dystrophy; ACIOL anterior chamber intraocular lens; IOL intraocular lens;  PCIOL posterior chamber intraocular lens; Phaco/IOL phacoemulsification with intraocular lens placement; [place] photorefractive keratectomy; LASIK laser  assisted in situ keratomileusis; HTN hypertension; DM diabetes mellitus; COPD chronic obstructive pulmonary disease

## 2020-05-11 ENCOUNTER — Ambulatory Visit (INDEPENDENT_AMBULATORY_CARE_PROVIDER_SITE_OTHER): Payer: 59 | Admitting: Ophthalmology

## 2020-05-11 ENCOUNTER — Other Ambulatory Visit: Payer: Self-pay

## 2020-05-11 ENCOUNTER — Encounter (INDEPENDENT_AMBULATORY_CARE_PROVIDER_SITE_OTHER): Payer: Self-pay | Admitting: Ophthalmology

## 2020-05-11 DIAGNOSIS — H4313 Vitreous hemorrhage, bilateral: Secondary | ICD-10-CM

## 2020-05-11 DIAGNOSIS — E113513 Type 2 diabetes mellitus with proliferative diabetic retinopathy with macular edema, bilateral: Secondary | ICD-10-CM

## 2020-05-11 DIAGNOSIS — H35033 Hypertensive retinopathy, bilateral: Secondary | ICD-10-CM

## 2020-05-11 DIAGNOSIS — I10 Essential (primary) hypertension: Secondary | ICD-10-CM | POA: Diagnosis not present

## 2020-05-11 DIAGNOSIS — H3581 Retinal edema: Secondary | ICD-10-CM

## 2020-05-11 DIAGNOSIS — I639 Cerebral infarction, unspecified: Secondary | ICD-10-CM

## 2020-05-11 DIAGNOSIS — Z9119 Patient's noncompliance with other medical treatment and regimen: Secondary | ICD-10-CM

## 2020-05-11 DIAGNOSIS — Z961 Presence of intraocular lens: Secondary | ICD-10-CM

## 2020-05-11 DIAGNOSIS — H40053 Ocular hypertension, bilateral: Secondary | ICD-10-CM

## 2020-05-11 DIAGNOSIS — E113511 Type 2 diabetes mellitus with proliferative diabetic retinopathy with macular edema, right eye: Secondary | ICD-10-CM

## 2020-05-11 DIAGNOSIS — H25811 Combined forms of age-related cataract, right eye: Secondary | ICD-10-CM

## 2020-05-11 DIAGNOSIS — H25813 Combined forms of age-related cataract, bilateral: Secondary | ICD-10-CM

## 2020-05-12 NOTE — Progress Notes (Signed)
[facility] Note  05/13/2020     CHIEF COMPLAINT Patient presents for Retina Follow Up   HISTORY OF PRESENT ILLNESS: Bill [name] is a 39 y.o. male who presents to the clinic today for:   HPI    Retina Follow Up    Patient presents with  Diabetic Retinopathy.  In both eyes.  This started weeks ago.  Severity is moderate.  Duration of weeks.  Since onset it is stable.  I, the attending physician,  performed the HPI with the patient and updated documentation appropriately.          Comments    Pt states vision is the same since Monday, 05/11/20.  Pt denies eye pain or discomfort and denies any new or worsening floaters or fol OU.       Last edited by Bill Caffey, MD on 05/13/2020  4:41 PM. (History)    pt is here for IVA OU today  Referring physician: Isaac [name], Bill Halsted, MD [place],  [place] [address]  HISTORICAL INFORMATION:   Selected notes from the MEDICAL RECORD NUMBER Referred by Dr. Hinton [name] for concern of severe NPDR / PDR OU; LEE-  Ocular Hx-  PMH-     CURRENT MEDICATIONS: No current outpatient medications on file. (Ophthalmic Drugs)   No current facility-administered medications for this visit. (Ophthalmic Drugs)   Current Outpatient Medications (Other)  Medication Sig  . aspirin (BAYER ASPIRIN EC LOW DOSE) 81 MG EC tablet Take 81 mg by mouth as needed.  [name] 1 GM 210 MG(Fe) tablet Take 210 mg by mouth 3 (three) times daily with meals.  . ferric citrate (AURYXIA) 1 GM 210 MG(Fe) tablet Take 210 mg by mouth as directed.  . fluticasone (FLONASE) 50 MCG/ACT nasal spray Place 1 spray into both nostrils daily as needed for allergies or rhinitis.  . multivitamin (RENA-VIT) TABS tablet Take 1 tablet by mouth daily.  . sertraline (ZOLOFT) 50 MG tablet TAKE 1 TABLET(50 MG) BY MOUTH DAILY   No current facility-administered medications for this visit. (Other)      REVIEW OF SYSTEMS: ROS     Positive for: Genitourinary, Endocrine, Eyes, Psychiatric   Negative for: Constitutional, Gastrointestinal, Neurological, Skin, Musculoskeletal, HENT, Cardiovascular, Respiratory, Allergic/Imm, Heme/Lymph   Last edited by Bill [name] on 05/13/2020  8:58 AM. (History)       ALLERGIES Allergies  Allergen Reactions  . Food     Melons-itchy/headaches    PAST MEDICAL HISTORY Past Medical History:  Diagnosis Date  . Anemia   . Anxiety    situational  . Diabetes mellitus without complication ([place])   . Diabetic retinopathy ([place])    PDR OU  . ESRD on dialysis [facility])    Dialyzing T-Th-S  . History of blood transfusion   . Hypertension   . PONV (postoperative nausea and vomiting)    03/22/2019- only 15 years ago  . Renal disorder   . Stroke [facility]) 2019   vision   Past Surgical History:  Procedure Laterality Date  . A/V FISTULAGRAM N/A 03/11/2019   Procedure: A/V FISTULAGRAM - Left Arm;  Surgeon: Waynetta Sandy, MD;  Location: [place] [facility];  Service: Cardiovascular;  Laterality: N/A;  . arm surgery Right    Fracture  . AV FISTULA PLACEMENT Left 01/25/2019   Procedure: ARTERIOVENOUS (AV) FISTULA CREATION  LEFT UPPER  ARM;  Surgeon: Serafina Mitchell, MD;  Location: [place];  Service:  Vascular;  Laterality: Left;  . BASCILIC VEIN TRANSPOSITION Left 03/27/2019   Procedure: SECOND STAGE BASILIC VEIN TRANSPOSITION LEFT ARM;  Surgeon: Serafina Mitchell, MD;  Location: [place];  Service: Vascular;  Laterality: Left;  . WISDOM TOOTH EXTRACTION      FAMILY HISTORY Family History  Problem Relation Age of Onset  . Diabetes Mellitus II Mother   . Hypertension Mother   . Cataracts Mother   . Diabetes Mellitus II Father   . Stroke Father   . Hypertension Father   . Glaucoma Maternal Grandmother   . Amblyopia Neg Hx   . Blindness Neg Hx   . Macular degeneration Neg Hx   . Retinal detachment Neg Hx   . Strabismus Neg Hx   . Retinitis pigmentosa Neg Hx     SOCIAL  HISTORY Social History   Tobacco Use  . Smoking status: Never Smoker  . Smokeless tobacco: Never Used  Vaping Use  . Vaping Use: Never used  Substance Use Topics  . Alcohol use: Not Currently  . Drug use: Not Currently         OPHTHALMIC EXAM:  Base Eye Exam    Visual Acuity (Snellen - Linear)      Right Left   Dist Rote 20/60 +1 20/50 +2   Dist ph [place] 20/50 -1    Correction: Glasses       Tonometry (Tonopen, 9:04 AM)      Right Left   Pressure 17 16       Extraocular Movement      Right Left    Full Full       Neuro/Psych    Oriented x3: Yes   Mood/Affect: Normal       Dilation    Both eyes: 1.0% Mydriacyl, 2.5% Phenylephrine @ 9:04 AM        Slit Lamp and Fundus Exam    Slit Lamp Exam      Right Left   Lids/Lashes Normal Normal   Conjunctiva/Sclera White and quiet White and quiet   Cornea Trace Punctate epithelial erosions Trace Punctate epithelial erosions   Anterior Chamber Deep and quiet Deep and quiet   Iris Round and dilated, No NVI Round and dilated, no NVI   Lens 1+ Nuclear sclerosis, 1-2+ Cortical cataract, 2+Posterior subcapsular cataract PC IOL in good position   Vitreous VH improved, scattered fibrosis superior to disc, blood stained vitreous condensations turning white, old blood clots settled inferiorly and turning white Syneresis, blood stained vitreous condensations turning white, blood clots settled inferiorly -- still red       Fundus Exam      Right Left   Disc 2-3+Pallor, sharp rim, fibrosis 2-3+Pallor, Sharp rim, Compact, +fibrosis/regressing NVD, +heme   C/D Ratio 0.1 0.2   Macula Blunted foveal reflex, +fibrosis, +exudates/IRH, +MA Flat, blunted foveal reflex, atrophic, flat choroidal nevus superior macula, pre-retinal heme inferior macula   Vessels Severe attenuation, Tortuous, +fibrosis, +NV inferior to disc Sclerotic arterioles, Tortuous, +NV, fibrosis along superior arcades, severe Vascular attenuation   Periphery Attached, 360  PRP, tractional fibrosis superior and along arcades, greatest above disc Attached; good 360 PRP in place, scattered tractional fibrosis          IMAGING AND PROCEDURES  Imaging and Procedures for @TODAY @  OCT, Retina - OU - Both Eyes       Right Eye Quality was good. Central Foveal Thickness: 366. Progression has been stable. Findings include abnormal foveal contour, intraretinal hyper-reflective material, preretinal fibrosis,  vitreous traction, epiretinal membrane, no SRF, macular pucker, intraretinal fluid, inner retinal atrophy (Mild Interval increase in IRF/cystic changes).   Left Eye Quality was good. Central Foveal Thickness: 227. Progression has been stable. Findings include abnormal foveal contour, intraretinal hyper-reflective material, no SRF, inner retinal atrophy, epiretinal membrane, preretinal fibrosis, outer retinal atrophy, vitreomacular adhesion , no IRF, vitreous traction (Mild interval improvement in vitreous opacities ).   Notes *Images captured and stored on drive  Diagnosis / Impression:  PDR w/ tractional fibrosis OU OD: Mild Interval increase in IRF/cystic changes OS: Mild interval improvement in vitreous opacities    Clinical management:  See below  Abbreviations: NFP - Normal foveal profile. CME - cystoid macular edema. PED - pigment epithelial detachment. IRF - intraretinal fluid. SRF - subretinal fluid. EZ - ellipsoid zone. ERM - epiretinal membrane. ORA - outer retinal atrophy. ORT - outer retinal tubulation. SRHM - subretinal hyper-reflective material         Intravitreal Injection, Pharmacologic Agent - OD - Right Eye       Time Out 05/13/2020. 9:37 AM. Confirmed correct patient, procedure, site, and patient consented.   Anesthesia Topical anesthesia was used. Anesthetic medications included Lidocaine 2%, Proparacaine 0.5%.   Procedure Preparation included 5% betadine to ocular surface, eyelid speculum. A supplied needle was used.    Injection:  1.25 mg Bevacizumab (AVASTIN) 1.25mg /0.56mL SOLN   NDC: 30865-784-69, Lot: [id number] , Expiration date: 06/09/2020   Route: Intravitreal, Site: Right Eye, Waste: 0 mL  Post-op Post injection exam found visual acuity of at least counting fingers. The patient tolerated the procedure well. There were no complications. The patient received written and verbal post procedure care education. Post injection medications were not given.        Intravitreal Injection, Pharmacologic Agent - OS - Left Eye       Time Out 05/13/2020. 9:37 AM. Confirmed correct patient, procedure, site, and patient consented.   Anesthesia Topical anesthesia was used. Anesthetic medications included Lidocaine 2%, Proparacaine 0.5%.   Procedure Preparation included 5% betadine to ocular surface, eyelid speculum. A (32g) needle was used.   Injection:  1.25 mg Bevacizumab (AVASTIN) 1.25mg /0.26mL SOLN   NDC: 80m, [id number], Expiration date: 06/04/2020   Route: Intravitreal, Site: Left Eye, Waste: 0.05 mL  Post-op Post injection exam found visual acuity of at least counting fingers. The patient tolerated the procedure well. There were no complications. The patient received written and verbal post procedure care education. Post injection medications were not given.                 ASSESSMENT/PLAN:   ICD-10-CM   1. Proliferative diabetic retinopathy of both eyes with macular edema associated with type 2 diabetes mellitus (HCC)  06/17/2020 Intravitreal Injection, Pharmacologic Agent - OD - Right Eye    Intravitreal Injection, Pharmacologic Agent - OS - Left Eye    Bevacizumab (AVASTIN) SOLN 1.25 mg    Bevacizumab (AVASTIN) SOLN 1.25 mg    Bevacizumab (AVASTIN) SOLN 1.25 mg    Bevacizumab (AVASTIN) SOLN 1.25 mg  2. Vitreous hemorrhage of both eyes ([place])  H43.13   3. Retinal edema  H35.81 OCT, Retina - OU - Both Eyes  4. Essential hypertension  I10   5. Hypertensive retinopathy of both  eyes  H35.033   6. Ocular hypertension, bilateral  H40.053   7. Acute CVA (cerebrovascular accident) ([place])  I63.9   8. Combined forms of age-related cataract of right eye  H25.811   9. Pseudophakia  Z96.1  1-3. Proliferative diabetic retinopathy OU  - s/p PRP OD (10.02.19), fill-in (01.22.20), fill in OD (02.12.20)   - s/p PRP OS (05/02/2018), fill-in (02.19.20)  - s/p IVK #1 OD (01.22.20), OS (02.12.20)  - s/p IVA OD #1 (10.21.20), #2 (12.02.20), #3 (01.13.21), #4 (02.10.21), #5 (04.19.21), #6 (05.21.21), #7 (06.21.21), #8 (08.19.21), #9 (10.11.21)  - s/p IVA OS #1 (12.02.20), #2 (04.19.21), #3 (05.21.21), #4 (06.21.21), #5 (08.19.21), #6 (10.11.21)  - FA 8.19.19 shows leaking MA, patches of capillary nonperfusion OU  - repeat FA 10.11.21 shows persistent NV OU, large patches of vascular nonperfusion OU (OS>OD) -- would benefit from some fill in PRP  - discussed the importance of compliance with medical therapy and follow ups  - BCVA 20/50 OU  - exam shows interval decrease in VH, preretinal and subhyaloid hemorrhage, persistent preretinal fibrosis OU  - OCT shows OD: Mild Interval increase in IRF/cystic changes; OS: Mild interval improvement in vitreous opacities  - VH precautions reviewed -- minimize activities, keep head elevated, avoid ASA/NSAIDs/blood thinners as able  - pt now on heparin  - discussed possibility of surgery being needed in the future to stabilize eye and prevent further progression of diabetic eye disease  - recommend IVA OD #10 and OS #7 today, 12.01.21  - Avastin informed consent form signed and scanned on 01.13.2021  - f/u 4 wks -- DFE/OCT, possible injections  4,5. 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 Hinton [name] in mid August 2019 with a 2 wk history intractable headaches and decreased vision OU  - found to have BP of 170s/90s -- sent to ED and was admitted to [facility] where SBP was up to 200  - discussed importance  of tight BP control  - pt reports improved compliance with BP medications and improvement in BP  6. 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 Limited 2 sequence MRI head: Acute RIGHT periatrial subcentimeter  nonhemorrhagic infarct, in region of optic tract.  - could be limiting vision -- will check formal visual field once macular edema improves  7. Ocular Hypertention OU  - today, IOP OD 17, OS 16  - patient reports not taking cosopt bid OU for about a month--IOP OK OU  8. Mixed cataract OD - The symptoms of cataract, surgical options, and treatments and risks were discussed with patient. - discussed diagnosis and progression - under the expert management of Dr. Kathlen Mody - clear from a retina standpoint to proceed with cataract surgery when patient and surgeon are ready  9. Pseudophakia OS  - s/p CE/IOL (07.29.21, Dr. Kathlen Mody)  - IOL in good position, doing well - monitor   Ophthalmic Meds Ordered this visit:  Meds ordered this encounter  Medications  . Bevacizumab (AVASTIN) SOLN 1.25 mg  . Bevacizumab (AVASTIN) SOLN 1.25 mg  . Bevacizumab (AVASTIN) SOLN 1.25 mg  . Bevacizumab (AVASTIN) SOLN 1.25 mg       Return in about 4 weeks (around 06/10/2020) for PDR w/ DME OU -- , Dilated Exam, OCT, Possible Injxn.  There are no Patient Instructions on file for this visit.  This document serves as a record of services personally performed by Gardiner Sleeper, MD, PhD. It was created on their behalf by Roselee Nova, COMT. The creation of this record is the provider's dictation and/or activities during the visit.  Electronically signed by: Roselee Nova, COMT 05/13/20 4:48 PM  Gardiner Sleeper, M.D., Ph.D. Diseases &  Surgery of the Retina and Vitreous [facility]  I have reviewed the above documentation for accuracy and completeness, and I agree with the above. Gardiner Sleeper, M.D., Ph.D. 05/13/20 4:48  PM   Abbreviations: M myopia (nearsighted); A astigmatism; H hyperopia (farsighted); P presbyopia; Mrx spectacle prescription;  CTL contact lenses; OD right eye; OS left eye; OU both eyes  XT exotropia; ET esotropia; PEK punctate epithelial keratitis; PEE punctate epithelial erosions; DES dry eye syndrome; MGD meibomian gland dysfunction; ATs artificial tears; PFAT's preservative free artificial tears; Titanic nuclear sclerotic cataract; PSC posterior subcapsular cataract; ERM epi-retinal membrane; PVD posterior vitreous detachment; RD retinal detachment; DM diabetes mellitus; DR diabetic retinopathy; NPDR non-proliferative diabetic retinopathy; PDR proliferative diabetic retinopathy; CSME clinically significant macular edema; DME diabetic macular edema; dbh dot blot hemorrhages; CWS cotton wool spot; POAG primary open angle glaucoma; C/D cup-to-disc ratio; HVF humphrey visual field; GVF goldmann visual field; OCT optical coherence tomography; IOP intraocular pressure; BRVO Branch retinal vein occlusion; CRVO central retinal vein occlusion; CRAO central retinal artery occlusion; BRAO branch retinal artery occlusion; RT retinal tear; SB scleral buckle; PPV pars plana vitrectomy; VH Vitreous hemorrhage; PRP panretinal laser photocoagulation; IVK intravitreal kenalog; VMT vitreomacular traction; MH Macular hole;  NVD neovascularization of the disc; NVE neovascularization elsewhere; AREDS age related eye disease study; ARMD age related macular degeneration; POAG primary open angle glaucoma; EBMD epithelial/anterior basement membrane dystrophy; ACIOL anterior chamber intraocular lens; IOL intraocular lens; PCIOL posterior chamber intraocular lens; Phaco/IOL phacoemulsification with intraocular lens placement; [place]irt photorefractive keratectomy; LASIK laser assisted in situ keratomileusis; HTN hypertension; DM diabetes mellitus; COPD chronic obstructive pulmonary disease

## 2020-05-13 ENCOUNTER — Other Ambulatory Visit: Payer: Self-pay

## 2020-05-13 ENCOUNTER — Encounter (INDEPENDENT_AMBULATORY_CARE_PROVIDER_SITE_OTHER): Payer: Self-pay | Admitting: Ophthalmology

## 2020-05-13 ENCOUNTER — Ambulatory Visit (INDEPENDENT_AMBULATORY_CARE_PROVIDER_SITE_OTHER): Payer: 59 | Admitting: Ophthalmology

## 2020-05-13 DIAGNOSIS — E113513 Type 2 diabetes mellitus with proliferative diabetic retinopathy with macular edema, bilateral: Secondary | ICD-10-CM

## 2020-05-13 DIAGNOSIS — H35033 Hypertensive retinopathy, bilateral: Secondary | ICD-10-CM

## 2020-05-13 DIAGNOSIS — I10 Essential (primary) hypertension: Secondary | ICD-10-CM

## 2020-05-13 DIAGNOSIS — H4313 Vitreous hemorrhage, bilateral: Secondary | ICD-10-CM

## 2020-05-13 DIAGNOSIS — H3581 Retinal edema: Secondary | ICD-10-CM | POA: Diagnosis not present

## 2020-05-13 DIAGNOSIS — H40053 Ocular hypertension, bilateral: Secondary | ICD-10-CM

## 2020-05-13 DIAGNOSIS — H25811 Combined forms of age-related cataract, right eye: Secondary | ICD-10-CM

## 2020-05-13 DIAGNOSIS — Z961 Presence of intraocular lens: Secondary | ICD-10-CM

## 2020-05-13 DIAGNOSIS — I639 Cerebral infarction, unspecified: Secondary | ICD-10-CM

## 2020-05-13 MED ORDER — BEVACIZUMAB CHEMO INJECTION 1.25MG/0.05ML SYRINGE FOR KALEIDOSCOPE
1.2500 mg | INTRAVITREAL | Status: AC | PRN
  Administered 2020-05-13: 1.25 mg via INTRAVITREAL

## 2020-06-11 ENCOUNTER — Encounter (INDEPENDENT_AMBULATORY_CARE_PROVIDER_SITE_OTHER): Payer: 59 | Admitting: Ophthalmology

## 2020-06-25 ENCOUNTER — Encounter: Payer: Self-pay | Admitting: Internal Medicine

## 2020-06-25 ENCOUNTER — Ambulatory Visit (INDEPENDENT_AMBULATORY_CARE_PROVIDER_SITE_OTHER): Payer: 59 | Admitting: Internal Medicine

## 2020-06-25 ENCOUNTER — Other Ambulatory Visit: Payer: Self-pay

## 2020-06-25 VITALS — BP 102/64 | HR 95 | Temp 98.2°F | Ht 71.0 in | Wt 190.7 lb

## 2020-06-25 DIAGNOSIS — Z23 Encounter for immunization: Secondary | ICD-10-CM

## 2020-06-25 DIAGNOSIS — E785 Hyperlipidemia, unspecified: Secondary | ICD-10-CM | POA: Diagnosis not present

## 2020-06-25 DIAGNOSIS — N186 End stage renal disease: Secondary | ICD-10-CM

## 2020-06-25 DIAGNOSIS — E1159 Type 2 diabetes mellitus with other circulatory complications: Secondary | ICD-10-CM

## 2020-06-25 DIAGNOSIS — E1169 Type 2 diabetes mellitus with other specified complication: Secondary | ICD-10-CM | POA: Diagnosis not present

## 2020-06-25 DIAGNOSIS — I10 Essential (primary) hypertension: Secondary | ICD-10-CM

## 2020-06-25 DIAGNOSIS — I5032 Chronic diastolic (congestive) heart failure: Secondary | ICD-10-CM

## 2020-06-25 DIAGNOSIS — Z0000 Encounter for general adult medical examination without abnormal findings: Secondary | ICD-10-CM

## 2020-06-25 DIAGNOSIS — Z992 Dependence on renal dialysis: Secondary | ICD-10-CM

## 2020-06-25 LAB — LIPID PANEL
Cholesterol: 193 mg/dL (ref 0–200)
HDL: 31.3 mg/dL — ABNORMAL LOW (ref >39.00)
NonHDL: 162.12
Total CHOL/HDL Ratio: 6
Triglycerides: 201 mg/dL — ABNORMAL HIGH (ref 0.0–149.0)
VLDL: 40.2 mg/dL — ABNORMAL HIGH (ref 0.0–40.0)

## 2020-06-25 LAB — TSH: TSH: 1.57 u[IU]/mL (ref 0.35–4.50)

## 2020-06-25 LAB — COMPREHENSIVE METABOLIC PANEL
ALT: 13 U/L (ref 0–53)
AST: 11 U/L (ref 0–37)
Albumin: 4.3 g/dL (ref 3.5–5.2)
Alkaline Phosphatase: 41 U/L (ref 39–117)
BUN: 35 mg/dL — ABNORMAL HIGH (ref 6–23)
CO2: 34 mEq/L — ABNORMAL HIGH (ref 19–32)
Calcium: 9.6 mg/dL (ref 8.4–10.5)
Chloride: 94 mEq/L — ABNORMAL LOW (ref 96–112)
Creatinine, Ser: 7.76 mg/dL (ref 0.40–1.50)
GFR: 8.02 mL/min — CL (ref >60.00)
Glucose, Bld: 186 mg/dL — ABNORMAL HIGH (ref 70–99)
Potassium: 4.2 mEq/L (ref 3.5–5.1)
Sodium: 135 mEq/L (ref 135–145)
Total Bilirubin: 0.5 mg/dL (ref 0.2–1.2)
Total Protein: 7.3 g/dL (ref 6.0–8.3)

## 2020-06-25 LAB — CBC WITH DIFFERENTIAL/PLATELET
Basophils Absolute: 0 10*3/uL (ref 0.0–0.1)
Basophils Relative: 0.5 % (ref 0.0–3.0)
Eosinophils Absolute: 0 10*3/uL (ref 0.0–0.7)
Eosinophils Relative: 1.1 % (ref 0.0–5.0)
HCT: 35.9 % — ABNORMAL LOW (ref 39.0–52.0)
Hemoglobin: 11.8 g/dL — ABNORMAL LOW (ref 13.0–17.0)
Lymphocytes Relative: 12.3 % (ref 12.0–46.0)
Lymphs Abs: 0.5 10*3/uL — ABNORMAL LOW (ref 0.7–4.0)
MCHC: 32.8 g/dL (ref 30.0–36.0)
MCV: 83.8 fl (ref 78.0–100.0)
Monocytes Absolute: 0.4 10*3/uL (ref 0.1–1.0)
Monocytes Relative: 10.5 % (ref 3.0–12.0)
Neutro Abs: 3.2 10*3/uL (ref 1.4–7.7)
Neutrophils Relative %: 75.6 % (ref 43.0–77.0)
Platelets: 110 10*3/uL — ABNORMAL LOW (ref 150.0–400.0)
RBC: 4.29 Mil/uL (ref 4.22–5.81)
RDW: 17.6 % — ABNORMAL HIGH (ref 11.5–15.5)
WBC: 4.2 10*3/uL (ref 4.0–10.5)

## 2020-06-25 LAB — PSA: PSA: 0.26 ng/mL (ref 0.10–4.00)

## 2020-06-25 LAB — LDL CHOLESTEROL, DIRECT: Direct LDL: 112 mg/dL

## 2020-06-25 LAB — HEMOGLOBIN A1C: Hgb A1c MFr Bld: 7.2 % — ABNORMAL HIGH (ref 4.6–6.5)

## 2020-06-25 LAB — VITAMIN B12: Vitamin B-12: 456 pg/mL (ref 211–911)

## 2020-06-25 NOTE — Addendum Note (Signed)
Addended by: Westley Hummer B on: 06/25/2020 05:32 PM   Modules accepted: Orders

## 2020-06-25 NOTE — Progress Notes (Signed)
Established Patient Office Visit     This visit occurred during the SARS-CoV-2 public health emergency.  Safety protocols were in place, including screening questions prior to the visit, additional usage of staff PPE, and extensive cleaning of exam room while observing appropriate contact time as indicated for disinfecting solutions.    CC/Reason for Visit: Annual preventive exam  HPI: Bill [name] is a 40 y.o. male who is coming in today for the above mentioned reasons. Past Medical History is significant for: End-stage renal disease on hemodialysis Monday, Wednesday, Friday, type 2 diabetes with significant diabetic retinopathy currently receiving intravitreal injections, prior history of nonhemorrhagic CVA, well-controlled hypertension, history of heart failure with preserved ejection fraction.  He takes no medications other than vitamins and binders.  He has been doing well since we last spoke.  He has had all COVID vaccines and flu vaccine, he is due for his pneumonia vaccine.  He has routine eye care, no dental care, does not exercise routinely.   Past Medical/Surgical History: Past Medical History:  Diagnosis Date  . Anemia   . Anxiety    situational  . Diabetes mellitus without complication ([place])   . Diabetic retinopathy ([place])    PDR OU  . ESRD on dialysis [facility])    Dialyzing T[place]Th-S  . History of blood transfusion   . Hypertension   . PONV (postoperative nausea and vomiting)    03/22/2019- only 15 years ago  . Renal disorder   . Stroke [facility]) 2019   vision    Past Surgical History:  Procedure Laterality Date  . A/V FISTULAGRAM N/A 03/11/2019   Procedure: A/V FISTULAGRAM - Left Arm;  Surgeon: Waynetta Sandy, MD;  Location: [place] [facility];  Service: Cardiovascular;  Laterality: N/A;  . arm surgery Right    Fracture  . AV FISTULA PLACEMENT Left 01/25/2019   Procedure: ARTERIOVENOUS (AV) FISTULA CREATION  LEFT UPPER  ARM;  Surgeon: Serafina Mitchell,  MD;  Location: [place];  Service: Vascular;  Laterality: Left;  . BASCILIC VEIN TRANSPOSITION Left 03/27/2019   Procedure: SECOND STAGE BASILIC VEIN TRANSPOSITION LEFT ARM;  Surgeon: Serafina Mitchell, MD;  Location: [place];  Service: Vascular;  Laterality: Left;  . WISDOM TOOTH EXTRACTION      Social History:  reports that he has never smoked. He has never used smokeless tobacco. He reports previous alcohol use. He reports previous drug use.  Allergies: Allergies  Allergen Reactions  . Food     Melons-itchy/headaches    Family History:  Family History  Problem Relation Age of Onset  . Diabetes Mellitus II Mother   . Hypertension Mother   . Cataracts Mother   . Diabetes Mellitus II Father   . Stroke Father   . Hypertension Father   . Glaucoma Maternal Grandmother   . Amblyopia Neg Hx   . Blindness Neg Hx   . Macular degeneration Neg Hx   . Retinal detachment Neg Hx   . Strabismus Neg Hx   . Retinitis pigmentosa Neg Hx      Current Outpatient Medications:  .  aspirin 81 MG EC tablet, Take 81 mg by mouth as needed., Disp: , Rfl:  .  AURYXIA 1 GM 210 MG(Fe) tablet, Take 210 mg by mouth 3 (three) times daily with meals., Disp: , Rfl:  .  ferric citrate (AURYXIA) 1 GM 210 MG(Fe) tablet, Take 210 mg by mouth as directed., Disp: , Rfl:  .  fluticasone (FLONASE) 50 MCG/ACT  nasal spray, Place 1 spray into both nostrils daily as needed for allergies or rhinitis., Disp: , Rfl:  .  multivitamin (RENA-VIT) TABS tablet, Take 1 tablet by mouth daily., Disp: , Rfl:  .  sertraline (ZOLOFT) 50 MG tablet, TAKE 1 TABLET(50 MG) BY MOUTH DAILY, Disp: 30 tablet, Rfl: 0  Review of Systems:  Constitutional: Denies fever, chills, diaphoresis, appetite change and fatigue.  HEENT: Denies photophobia, eye pain, redness, hearing loss, ear pain, congestion, sore throat, rhinorrhea, sneezing, mouth sores, trouble swallowing, neck pain, neck stiffness and tinnitus.   Respiratory: Denies SOB, DOE, cough,  chest tightness,  and wheezing.   Cardiovascular: Denies chest pain, palpitations and leg swelling.  Gastrointestinal: Denies nausea, vomiting, abdominal pain, diarrhea, constipation, blood in stool and abdominal distention.  Genitourinary: Denies dysuria, urgency, frequency, hematuria, flank pain and difficulty urinating.  Endocrine: Denies: hot or cold intolerance, sweats, changes in hair or nails, polyuria, polydipsia. Musculoskeletal: Denies myalgias, back pain, joint swelling, arthralgias and gait problem.  Skin: Denies pallor, rash and wound.  Neurological: Denies dizziness, seizures, syncope, weakness, light-headedness, numbness and headaches.  Hematological: Denies adenopathy. Easy bruising, personal or family bleeding history  Psychiatric/Behavioral: Denies suicidal ideation, mood changes, confusion, nervousness, sleep disturbance and agitation    Physical Exam: Vitals:   06/25/20 0737  BP: 102/64  Pulse: 95  Temp: 98.2 F (36.8 C)  TempSrc: Oral  SpO2: 99%  Weight: 190 lb 11.2 oz (86.5 kg)  Height: 5\' 11"  (1.803 m)    Body mass index is 26.6 kg/m.   Constitutional: NAD, calm, comfortable Eyes: PERRL, lids and conjunctivae normal ENMT: Mucous membranes are moist. Posterior pharynx clear of any exudate or lesions. Normal dentition. Tympanic membrane is pearly white, no erythema or bulging. Neck: normal, supple, no masses, no thyromegaly Respiratory: clear to auscultation bilaterally, no wheezing, no crackles. Normal respiratory effort. No accessory muscle use.  Cardiovascular: Regular rate and rhythm, no murmurs / rubs / gallops. No extremity edema. 2+ pedal pulses. No carotid bruits.  Abdomen: no tenderness, no masses palpated. No hepatosplenomegaly. Bowel sounds positive.  Musculoskeletal: no clubbing / cyanosis. No joint deformity upper and lower extremities. Good ROM, no contractures. Normal muscle tone.  Skin: no rashes, lesions, ulcers. No induration Neurologic:  CN 2-12 grossly intact. Sensation intact, DTR normal. Strength 5/5 in all 4.  Psychiatric: Normal judgment and insight. Alert and oriented x 3. Normal mood.    Impression and Plan:  Encounter for preventive health examination  -He has routine eye care, have advised routine dental care. -Pneumonia vaccine today, he has had his flu, COVID and tetanus updated. -Screening labs today. -Healthy lifestyle discussed in detail. -PSA today for prostate cancer screening. -Commence routine colon cancer screening age 1.  Hyperlipidemia associated with type 2 diabetes mellitus ([place])  - Plan: Lipid panel -Last LDL was 94 in 2020, goal would be less than 70.  Consider statin therapy pending lipid results.  Type 2 diabetes mellitus with vascular disease ([place])  -Not on medications, his last A1c was 6.1, recheck A1c today.  He has had significant hypoglycemia in the past that led to cessation of therapy.  ESRD (end stage renal disease) on dialysis ([place]) -On hemodialysis 3 times a week.  Chronic heart failure with preserved ejection fraction ([facility]) -Stable, no chest pain, not on any medications due to hypotension with dialysis.  Primary hypertension -Well-controlled, not currently on medications.  Need for vaccination against Streptococcus pneumoniae -PPSV23 administered today.    Patient Instructions   -Nice seeing  you today!!  -Lab work today; will notify you once results are available.  -Pneumonia vaccine today.  -Schedule follow up in 3 months.   Preventive Care 68-70 Years Old, Male Preventive care refers to lifestyle choices and visits with your health care provider that can promote health and wellness. This includes:  A yearly physical exam. This is also called an annual wellness visit.  Regular dental and eye exams.  Immunizations.  Screening for certain conditions.  Healthy lifestyle choices, such as: ? Eating a healthy diet. ? Getting regular exercise. ? Not using  drugs or products that contain nicotine and tobacco. ? Limiting alcohol use. What can I expect for my preventive care visit? Physical exam Your health care provider will check your:  Height and weight. These may be used to calculate your BMI (body mass index). BMI is a measurement that tells if you are at a healthy weight.  Heart rate and blood pressure.  Body temperature.  Skin for abnormal spots. Counseling Your health care provider may ask you questions about your:  Past medical problems.  Family's medical history.  Alcohol, tobacco, and drug use.  Emotional well-being.  Home life and relationship well-being.  Sexual activity.  Diet, exercise, and sleep habits.  Work and work Statistician.  Access to firearms. What immunizations do I need? Vaccines are usually given at various ages, according to a schedule. Your health care provider will recommend vaccines for you based on your age, medical history, and lifestyle or other factors, such as travel or where you work.   What tests do I need? Blood tests  Lipid and cholesterol levels. These may be checked every 5 years, or more often if you are over 58 years old.  Hepatitis C test.  Hepatitis B test. Screening  Lung cancer screening. You may have this screening every year starting at age 47 if you have a 30-pack-year history of smoking and currently smoke or have quit within the past 15 years.  Prostate cancer screening. Recommendations will vary depending on your family history and other risks.  Genital exam to check for testicular cancer or hernias.  Colorectal cancer screening. ? All adults should have this screening starting at age 76 and continuing until age 61. ? Your health care provider may recommend screening at age 41 if you are at increased risk. ? You will have tests every 1-10 years, depending on your results and the type of screening test.  Diabetes screening. ? This is done by checking your blood  sugar (glucose) after you have not eaten for a while (fasting). ? You may have this done every 1-3 years.  STD (sexually transmitted disease) testing, if you are at risk. Follow these instructions at home: Eating and drinking  Eat a diet that includes fresh fruits and vegetables, whole grains, lean protein, and low-fat dairy products.  Take vitamin and mineral supplements as recommended by your health care provider.  Do not drink alcohol if your health care provider tells you not to drink.  If you drink alcohol: ? Limit how much you have to 0-2 drinks a day. ? Be aware of how much alcohol is in your drink. In the [place] one drink equals one 12 oz bottle of beer (355 mL), one 5 oz glass of wine (148 mL), or one 1 oz glass of hard liquor (44 mL).   Lifestyle  Take daily care of your teeth and gums. Brush your teeth every morning and night with fluoride toothpaste. Floss one time  each day.  Stay active. Exercise for at least 30 minutes 5 or more days each week.  Do not use any products that contain nicotine or tobacco, such as cigarettes, e-cigarettes, and chewing tobacco. If you need help quitting, ask your health care provider.  Do not use drugs.  If you are sexually active, practice safe sex. Use a condom or other form of protection to prevent STIs (sexually transmitted infections).  If told by your health care provider, take low-dose aspirin daily starting at age 83.  Find healthy ways to cope with stress, such as: ? Meditation, yoga, or listening to music. ? Journaling. ? Talking to a trusted person. ? Spending time with friends and family. Safety  Always wear your seat belt while driving or riding in a vehicle.  Do not drive: ? If you have been drinking alcohol. Do not ride with someone who has been drinking. ? When you are tired or distracted. ? While texting.  Wear a helmet and other protective equipment during sports activities.  If you have firearms in your house,  make sure you follow all gun safety procedures. What's next?  Go to your health care provider once a year for an annual wellness visit.  Ask your health care provider how often you should have your eyes and teeth checked.  Stay up to date on all vaccines. This information is not intended to replace advice given to you by your health care provider. Make sure you discuss any questions you have with your health care provider. Document Revised: 02/26/2019 Document Reviewed: 05/24/2018 Elsevier Patient Education  2021 [place], MD [facility]

## 2020-06-25 NOTE — Patient Instructions (Signed)
-Nice seeing you today!!  -Lab work today; will notify you once results are available.  -Pneumonia vaccine today.  -Schedule follow up in 3 months.   Preventive Care 15-42 Years Old, Male Preventive care refers to lifestyle choices and visits with your health care provider that can promote health and wellness. This includes:  A yearly physical exam. This is also called an annual wellness visit.  Regular dental and eye exams.  Immunizations.  Screening for certain conditions.  Healthy lifestyle choices, such as: ? Eating a healthy diet. ? Getting regular exercise. ? Not using drugs or products that contain nicotine and tobacco. ? Limiting alcohol use. What can I expect for my preventive care visit? Physical exam Your health care provider will check your:  Height and weight. These may be used to calculate your BMI (body mass index). BMI is a measurement that tells if you are at a healthy weight.  Heart rate and blood pressure.  Body temperature.  Skin for abnormal spots. Counseling Your health care provider may ask you questions about your:  Past medical problems.  Family's medical history.  Alcohol, tobacco, and drug use.  Emotional well-being.  Home life and relationship well-being.  Sexual activity.  Diet, exercise, and sleep habits.  Work and work Statistician.  Access to firearms. What immunizations do I need? Vaccines are usually given at various ages, according to a schedule. Your health care provider will recommend vaccines for you based on your age, medical history, and lifestyle or other factors, such as travel or where you work.   What tests do I need? Blood tests  Lipid and cholesterol levels. These may be checked every 5 years, or more often if you are over 65 years old.  Hepatitis C test.  Hepatitis B test. Screening  Lung cancer screening. You may have this screening every year starting at age 47 if you have a 30-pack-year history of  smoking and currently smoke or have quit within the past 15 years.  Prostate cancer screening. Recommendations will vary depending on your family history and other risks.  Genital exam to check for testicular cancer or hernias.  Colorectal cancer screening. ? All adults should have this screening starting at age 65 and continuing until age 34. ? Your health care provider may recommend screening at age 51 if you are at increased risk. ? You will have tests every 1-10 years, depending on your results and the type of screening test.  Diabetes screening. ? This is done by checking your blood sugar (glucose) after you have not eaten for a while (fasting). ? You may have this done every 1-3 years.  STD (sexually transmitted disease) testing, if you are at risk. Follow these instructions at home: Eating and drinking  Eat a diet that includes fresh fruits and vegetables, whole grains, lean protein, and low-fat dairy products.  Take vitamin and mineral supplements as recommended by your health care provider.  Do not drink alcohol if your health care provider tells you not to drink.  If you drink alcohol: ? Limit how much you have to 0-2 drinks a day. ? Be aware of how much alcohol is in your drink. In the [place] one drink equals one 12 oz bottle of beer (355 mL), one 5 oz glass of wine (148 mL), or one 1 oz glass of hard liquor (44 mL).   Lifestyle  Take daily care of your teeth and gums. Brush your teeth every morning and night with fluoride toothpaste. Floss one  time each day.  Stay active. Exercise for at least 30 minutes 5 or more days each week.  Do not use any products that contain nicotine or tobacco, such as cigarettes, e-cigarettes, and chewing tobacco. If you need help quitting, ask your health care provider.  Do not use drugs.  If you are sexually active, practice safe sex. Use a condom or other form of protection to prevent STIs (sexually transmitted infections).  If told  by your health care provider, take low-dose aspirin daily starting at age 50.  Find healthy ways to cope with stress, such as: ? Meditation, yoga, or listening to music. ? Journaling. ? Talking to a trusted person. ? Spending time with friends and family. Safety  Always wear your seat belt while driving or riding in a vehicle.  Do not drive: ? If you have been drinking alcohol. Do not ride with someone who has been drinking. ? When you are tired or distracted. ? While texting.  Wear a helmet and other protective equipment during sports activities.  If you have firearms in your house, make sure you follow all gun safety procedures. What's next?  Go to your health care provider once a year for an annual wellness visit.  Ask your health care provider how often you should have your eyes and teeth checked.  Stay up to date on all vaccines. This information is not intended to replace advice given to you by your health care provider. Make sure you discuss any questions you have with your health care provider. Document Revised: 02/26/2019 Document Reviewed: 05/24/2018 Elsevier Patient Education  2021 [employer].

## 2020-06-26 ENCOUNTER — Other Ambulatory Visit: Payer: Self-pay | Admitting: Internal Medicine

## 2020-06-26 DIAGNOSIS — E785 Hyperlipidemia, unspecified: Secondary | ICD-10-CM

## 2020-06-26 DIAGNOSIS — E1159 Type 2 diabetes mellitus with other circulatory complications: Secondary | ICD-10-CM

## 2020-06-26 DIAGNOSIS — E1169 Type 2 diabetes mellitus with other specified complication: Secondary | ICD-10-CM

## 2020-06-26 MED ORDER — ATORVASTATIN CALCIUM 10 MG PO TABS: 10.0000 mg | ORAL_TABLET | Freq: Every day | ORAL | 1 refills | Status: DC

## 2020-06-26 MED ORDER — TRULICITY 0.75 MG/0.5ML ~~LOC~~ SOAJ: 0.7500 mg | SUBCUTANEOUS | 3 refills | Status: DC

## 2020-07-01 ENCOUNTER — Other Ambulatory Visit: Payer: Self-pay | Admitting: Internal Medicine

## 2020-07-01 DIAGNOSIS — E1159 Type 2 diabetes mellitus with other circulatory complications: Secondary | ICD-10-CM

## 2020-07-02 NOTE — Progress Notes (Addendum)
[facility] Note  07/07/2020     CHIEF COMPLAINT Patient presents for Retina Follow Up   HISTORY OF PRESENT ILLNESS: Bill [name] is a 40 y.o. male who presents to the clinic today for:   HPI    Retina Follow Up    Patient presents with  Diabetic Retinopathy.  In both eyes.  This started 4.  Duration of weeks.  I, the attending physician,  performed the HPI with the patient and updated documentation appropriately.          Comments    Patient here for 4 weeks retina follow up for PDR OU. Patient states vision about the same. No changes. No eye pain. Was put back on cholesterol med.       Last edited by [name], MD on 07/07/2020 12:11 PM. (History)    pt states vision is continuing to improve  Referring physician: Isaac Bliss, Rayford Halsted, MD [address] [place],  [place] [address]  HISTORICAL INFORMATION:   Selected notes from the MEDICAL RECORD NUMBER Referred by Dr. [name] for concern of severe NPDR / PDR OU; LEE-  Ocular Hx-  PMH-     CURRENT MEDICATIONS: No current outpatient medications on file. (Ophthalmic Drugs)   No current facility-administered medications for this visit. (Ophthalmic Drugs)   Current Outpatient Medications (Other)  Medication Sig  . aspirin 81 MG EC tablet Take 81 mg by mouth as needed.  Marland Kitchen atorvastatin (LIPITOR) 10 MG tablet Take 1 tablet (10 mg total) by mouth daily.  [name] 1 GM 210 MG(Fe) tablet Take 210 mg by mouth 3 (three) times daily with meals.  . Dulaglutide (TRULICITY) [id number] 0000000 SOPN Inject 0.75 mg into the skin once a week.  . ferric citrate (AURYXIA) 1 GM 210 MG(Fe) tablet Take 210 mg by mouth as directed.  . fluticasone (FLONASE) 50 MCG/ACT nasal spray Place 1 spray into both nostrils daily as needed for allergies or rhinitis.  . multivitamin (RENA-VIT) TABS tablet Take 1 tablet by mouth daily.  . sertraline (ZOLOFT) 50 MG tablet TAKE 1 TABLET(50 MG) BY MOUTH DAILY    No current facility-administered medications for this visit. (Other)      REVIEW OF SYSTEMS: ROS    Positive for: Genitourinary, Endocrine, Eyes, Psychiatric   Negative for: Constitutional, Gastrointestinal, Neurological, Skin, Musculoskeletal, HENT, Cardiovascular, Respiratory, Allergic/Imm, Heme/Lymph   Last edited by [name], COA on 07/07/2020 10:13 AM. (History)       ALLERGIES Allergies  Allergen Reactions  . Food     Melons-itchy/headaches    PAST MEDICAL HISTORY Past Medical History:  Diagnosis Date  . Anemia   . Anxiety    situational  . Diabetes mellitus without complication ([place])   . Diabetic retinopathy ([place])    PDR OU  . ESRD on dialysis [facility])    Dialyzing T-Th-S  . History of blood transfusion   . Hypertension   . PONV (postoperative nausea and vomiting)    03/22/2019- only 15 years ago  . Renal disorder   . Stroke [facility]) 2019   vision   Past Surgical History:  Procedure Laterality Date  . A/V FISTULAGRAM N/A 03/11/2019   Procedure: A/V FISTULAGRAM - Left Arm;  Surgeon: Waynetta Sandy, MD;  Location: [facility];  Service: Cardiovascular;  Laterality: N/A;  . arm surgery Right    Fracture  . AV FISTULA PLACEMENT Left 01/25/2019   Procedure: ARTERIOVENOUS (AV) FISTULA CREATION  LEFT UPPER  ARM;  Surgeon: Serafina Mitchell, MD;  Location: [facility] OR;  Service: Vascular;  Laterality: Left;  . BASCILIC VEIN TRANSPOSITION Left 03/27/2019   Procedure: SECOND STAGE BASILIC VEIN TRANSPOSITION LEFT ARM;  Surgeon: Serafina Mitchell, MD;  Location: [facility];  Service: Vascular;  Laterality: Left;  . WISDOM TOOTH EXTRACTION      FAMILY HISTORY Family History  Problem Relation Age of Onset  . Diabetes Mellitus II Mother   . Hypertension Mother   . Cataracts Mother   . Diabetes Mellitus II Father   . Stroke Father   . Hypertension Father   . Glaucoma Maternal Grandmother   . Amblyopia Neg Hx   . Blindness Neg Hx   . Macular degeneration  Neg Hx   . Retinal detachment Neg Hx   . Strabismus Neg Hx   . Retinitis pigmentosa Neg Hx     SOCIAL HISTORY Social History   Tobacco Use  . Smoking status: Never Smoker  . Smokeless tobacco: Never Used  Vaping Use  . Vaping Use: Never used  Substance Use Topics  . Alcohol use: Not Currently  . Drug use: Not Currently         OPHTHALMIC EXAM:  Base Eye Exam    Visual Acuity (Snellen - Linear)      Right Left   Dist [place] 20/40 -2 20/40   Dist ph [place] NI NI       Tonometry (Tonopen, 10:10 AM)      Right Left   Pressure 15 18       Pupils      Dark Light Shape React APD   Right 4 3 Round Brisk None   Left 4 3 Round Brisk None       Visual Fields (Counting fingers)      Left Right    Full    Restrictions  Partial outer inferior nasal deficiency       Extraocular Movement      Right Left    Full Full       Neuro/Psych    Oriented x3: Yes   Mood/Affect: Normal       Dilation    Both eyes: 1.0% Mydriacyl, 2.5% Phenylephrine @ 10:10 AM        Slit Lamp and Fundus Exam    Slit Lamp Exam      Right Left   Lids/Lashes Normal Normal   Conjunctiva/Sclera White and quiet White and quiet   Cornea Trace Punctate epithelial erosions Trace Punctate epithelial erosions   Anterior Chamber Deep and quiet Deep and quiet   Iris Round and dilated, No NVI Round and dilated, no NVI   Lens 1+ Nuclear sclerosis, 1-2+ Cortical cataract, 2+Posterior subcapsular cataract PC IOL in good position   Vitreous VH improved, scattered fibrosis superior to disc, blood stained vitreous condensations turning white -- slightly improving, old blood clots settled inferiorly and turning white Syneresis, white blood stained vitreous condensations settling inferiorly and improving       Fundus Exam      Right Left   Disc 2-3+Pallor, sharp rim, fibrosis 2-3+Pallor, Sharp rim, Compact, +fibrosis/regressing NVD, no heme   C/D Ratio 0.1 0.2   Macula Blunted foveal reflex, +fibrosis,  +exudates/IRH, +MA, macular pseudo hole like appearance Flat, blunted foveal reflex, atrophic, flat choroidal nevus superior macula   Vessels Severe attenuation, Tortuous, +fibrosis, +NV inferior to disc Sclerotic arterioles, Tortuous, +NV, fibrosis along superior arcades, severe Vascular attenuation   Periphery Attached,  good 360 PRP, room for posterior fill in if needed, tractional fibrosis superior and along arcades, greatest above disc Attached; good 360 PRP in place -- room for posterior fill in if needed, scattered tractional fibrosis          IMAGING AND PROCEDURES  Imaging and Procedures for '@TODAY'$ @  OCT, Retina - OU - Both Eyes       Right Eye Quality was good. Central Foveal Thickness: 374. Progression has improved. Findings include abnormal foveal contour, intraretinal hyper-reflective material, preretinal fibrosis, vitreous traction, epiretinal membrane, no SRF, macular pucker, intraretinal fluid, inner retinal atrophy (Mild Interval improvement in IRF/cystic changes).   Left Eye Quality was good. Central Foveal Thickness: 225. Progression has improved. Findings include abnormal foveal contour, intraretinal hyper-reflective material, no SRF, inner retinal atrophy, epiretinal membrane, preretinal fibrosis, outer retinal atrophy, vitreomacular adhesion , no IRF, vitreous traction (Mild interval improvement in vitreous opacities, persistent retinal atrophy).   Notes *Images captured and stored on drive  Diagnosis / Impression:  PDR w/ tractional fibrosis OU OD: Mild Interval improvement in IRF/cystic changes OS: Mild interval improvement in vitreous opacities, persistent retinal atrophy  Clinical management:  See below  Abbreviations: NFP - Normal foveal profile. CME - cystoid macular edema. PED - pigment epithelial detachment. IRF - intraretinal fluid. SRF - subretinal fluid. EZ - ellipsoid zone. ERM - epiretinal membrane. ORA - outer retinal atrophy. ORT - outer retinal  tubulation. SRHM - subretinal hyper-reflective material         Intravitreal Injection, Pharmacologic Agent - OD - Right Eye       Time Out 07/07/2020. 11:02 AM. Confirmed correct patient, procedure, site, and patient consented.   Anesthesia Topical anesthesia was used. Anesthetic medications included Lidocaine 2%, Proparacaine 0.5%.   Procedure Preparation included 5% betadine to ocular surface, eyelid speculum. A supplied needle was used.   Injection:  1.25 mg Bevacizumab (AVASTIN) 1.'25mg'$ /0.75m SOLN   NDC: [id number] Lot: [id number]'$ , Expiration date: 08/19/2020   Route: Intravitreal, Site: Right Eye, Waste: 0 mL  Post-op Post injection exam found visual acuity of at least counting fingers. The patient tolerated the procedure well. There were no complications. The patient received written and verbal post procedure care education. Post injection medications were not given.        Intravitreal Injection, Pharmacologic Agent - OS - Left Eye       Time Out 07/07/2020. 11:02 AM. Confirmed correct patient, procedure, site, and patient consented.   Anesthesia Topical anesthesia was used. Anesthetic medications included Lidocaine 2%, Proparacaine 0.5%.   Procedure Preparation included 5% betadine to ocular surface, eyelid speculum. A (32g) needle was used.   Injection:  1.25 mg Bevacizumab (AVASTIN) 1.'25mg'$ /0.070mSOLN   NDC: 5080mLot: [id number] date: 07/30/2020   Route: Intravitreal, Site: Left Eye, Waste: 0.05 mL  Post-op Post injection exam found visual acuity of at least counting fingers. The patient tolerated the procedure well. There were no complications. The patient received written and verbal post procedure care education. Post injection medications were not given.                 ASSESSMENT/PLAN:   ICD-10-CM   1. Proliferative diabetic retinopathy of both eyes with macular edema associated with type 2 diabetes mellitus (HCC)  E13/2/2022Intravitreal Injection, Pharmacologic Agent - OD - Right Eye    Intravitreal Injection, Pharmacologic Agent - OS - Left Eye    Bevacizumab (AVASTIN) SOLN 1.25 mg    Bevacizumab (AVASTIN) SOLN 1.25 mg  2.  Vitreous hemorrhage of both eyes ([place])  H43.13   3. Retinal edema  H35.81 OCT, Retina - OU - Both Eyes  4. Essential hypertension  I10   5. Hypertensive retinopathy of both eyes  H35.033   6. Acute CVA (cerebrovascular accident) ([place])  I63.9   7. Ocular hypertension, bilateral  H40.053   8. Combined forms of age-related cataract of right eye  H25.811   9. Pseudophakia  Z96.1    1-3. Proliferative diabetic retinopathy OU  - s/p PRP OD (10.02.19), fill-in (01.22.20), fill in OD (02.12.20)   - s/p PRP OS (05/02/2018), fill-in (02.19.20)  - s/p IVK #1 OD (01.22.20), OS (02.12.20)  - s/p IVA OD #1 (10.21.20), #2 (12.02.20), #3 (01.13.21), #4 (02.10.21), #5 (04.19.21), #6 (05.21.21), #7 (06.21.21), #8 (08.19.21), #9 (10.11.21), #10 (12.01.21)  - s/p IVA OS #1 (12.02.20), #2 (04.19.21), #3 (05.21.21), #4 (06.21.21), #5 (08.19.21), #6 (10.11.21), #7 (12.01.21)  - FA 8.19.19 shows leaking MA, patches of capillary nonperfusion OU  - repeat FA 10.11.21 shows persistent NV OU, large patches of vascular nonperfusion OU (OS>OD) -- would benefit from some fill in PRP OU (OS first)  - discussed the importance of compliance with medical therapy and follow ups  - BCVA 20/40 OU (improved from 20/50 OU)  - exam shows interval decrease in VH, preretinal and subhyaloid hemorrhage, persistent preretinal fibrosis OU  - OCT shows OD: Mild Interval improvement in IRF/cystic changes; OS: Mild interval improvement in vitreous opacities, persistent retinal atrophy  - VH precautions reviewed -- minimize activities, keep head elevated, avoid ASA/NSAIDs/blood thinners as able  - pt now on heparin  - discussed possibility of surgery being needed in the future to stabilize eye and prevent further progression of diabetic  eye disease  - recommend IVA OD #11and OS #8 today, 01.25.22  - Avastin informed consent form signed and scanned on 01.13.2021  - f/u 2 wks -- DFE/OCT, PRP OS  4,5. 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 [name] in mid August 2019 with a 2 wk history intractable headaches and decreased vision OU  - found to have BP of 170s/90s -- sent to ED and was admitted to [facility] where SBP was up to 200  - discussed importance of tight BP control  - pt reports improved compliance with BP medications and improvement in BP  6. 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 Limited 2 sequence MRI head: Acute RIGHT periatrial subcentimeter  nonhemorrhagic infarct, in region of optic tract.  - could be limiting vision -- will check formal visual field once macular edema improves  7. Ocular Hypertention OU  - today, IOP OD 15, OS 18  - patient reports not taking cosopt bid OU for about a month--IOP OK OU  8. Mixed cataract OD - The symptoms of cataract, surgical options, and treatments and risks were discussed with patient. - discussed diagnosis and progression - under the expert management of Dr. Kathlen Mody - clear from a retina standpoint to proceed with cataract surgery when patient and surgeon are ready  9. Pseudophakia OS  - s/p CE/IOL (07.29.21, Dr. Kathlen Mody)  - IOL in good position, doing well - monitor   Ophthalmic Meds Ordered this visit:  Meds ordered this encounter  Medications  . Bevacizumab (AVASTIN) SOLN 1.25 mg  . Bevacizumab (AVASTIN) SOLN 1.25 mg       Return in about 2 weeks (around 07/21/2020) for f/u PDR OU,  DFE, OCT, PRP.  There are no Patient Instructions on file for this visit.  This document serves as a record of services personally performed by Gardiner Sleeper, MD, PhD. It was created on their behalf by Leeann Must, [place], an ophthalmic technician. The creation of this record is the provider's  dictation and/or activities during the visit.    Electronically signed by: Leeann Must, [place] 01.20.2022 12:19 PM   This document serves as a record of services personally performed by Gardiner Sleeper, MD, PhD. It was created on their behalf by San Jetty. Owens Shark, OA an ophthalmic technician. The creation of this record is the provider's dictation and/or activities during the visit.    Electronically signed by: San Jetty. Owens Shark, [place] 01.25.2022 12:19 PM  Gardiner Sleeper, M.D., Ph.D. Diseases & Surgery of the Retina and [place] 07/07/2020   I have reviewed the above documentation for accuracy and completeness, and I agree with the above. Gardiner Sleeper, M.D., Ph.D. 07/07/20 12:19 PM   Abbreviations: M myopia (nearsighted); A astigmatism; H hyperopia (farsighted); P presbyopia; Mrx spectacle prescription;  CTL contact lenses; OD right eye; OS left eye; OU both eyes  XT exotropia; ET esotropia; PEK punctate epithelial keratitis; PEE punctate epithelial erosions; DES dry eye syndrome; MGD meibomian gland dysfunction; ATs artificial tears; PFAT's preservative free artificial tears; [place] nuclear sclerotic cataract; PSC posterior subcapsular cataract; ERM epi-retinal membrane; PVD posterior vitreous detachment; RD retinal detachment; DM diabetes mellitus; DR diabetic retinopathy; NPDR non-proliferative diabetic retinopathy; PDR proliferative diabetic retinopathy; CSME clinically significant macular edema; DME diabetic macular edema; dbh dot blot hemorrhages; CWS cotton wool spot; POAG primary open angle glaucoma; C/D cup-to-disc ratio; HVF humphrey visual field; GVF goldmann visual field; OCT optical coherence tomography; IOP intraocular pressure; BRVO Branch retinal vein occlusion; CRVO central retinal vein occlusion; CRAO central retinal artery occlusion; BRAO branch retinal artery occlusion; RT retinal tear; SB scleral buckle; PPV pars plana vitrectomy; VH Vitreous  hemorrhage; PRP panretinal laser photocoagulation; IVK intravitreal kenalog; VMT vitreomacular traction; MH Macular hole;  NVD neovascularization of the disc; NVE neovascularization elsewhere; AREDS age related eye disease study; ARMD age related macular degeneration; POAG primary open angle glaucoma; EBMD epithelial/anterior basement membrane dystrophy; ACIOL anterior chamber intraocular lens; IOL intraocular lens; PCIOL posterior chamber intraocular lens; Phaco/IOL phacoemulsification with intraocular lens placement; [place] photorefractive keratectomy; LASIK laser assisted in situ keratomileusis; HTN hypertension; DM diabetes mellitus; COPD chronic obstructive pulmonary disease

## 2020-07-02 NOTE — Telephone Encounter (Signed)
Pharmacy comment: Alternative Requested:INS REQ HX OF METFORMIN.

## 2020-07-04 ENCOUNTER — Other Ambulatory Visit: Payer: Self-pay | Admitting: Internal Medicine

## 2020-07-04 DIAGNOSIS — E1159 Type 2 diabetes mellitus with other circulatory complications: Secondary | ICD-10-CM

## 2020-07-07 ENCOUNTER — Encounter (INDEPENDENT_AMBULATORY_CARE_PROVIDER_SITE_OTHER): Payer: Self-pay | Admitting: Ophthalmology

## 2020-07-07 ENCOUNTER — Other Ambulatory Visit: Payer: Self-pay

## 2020-07-07 ENCOUNTER — Ambulatory Visit (INDEPENDENT_AMBULATORY_CARE_PROVIDER_SITE_OTHER): Payer: 59 | Admitting: Ophthalmology

## 2020-07-07 DIAGNOSIS — E113511 Type 2 diabetes mellitus with proliferative diabetic retinopathy with macular edema, right eye: Secondary | ICD-10-CM

## 2020-07-07 DIAGNOSIS — H4313 Vitreous hemorrhage, bilateral: Secondary | ICD-10-CM | POA: Diagnosis not present

## 2020-07-07 DIAGNOSIS — E113513 Type 2 diabetes mellitus with proliferative diabetic retinopathy with macular edema, bilateral: Secondary | ICD-10-CM

## 2020-07-07 DIAGNOSIS — Z9119 Patient's noncompliance with other medical treatment and regimen: Secondary | ICD-10-CM

## 2020-07-07 DIAGNOSIS — H3581 Retinal edema: Secondary | ICD-10-CM | POA: Diagnosis not present

## 2020-07-07 DIAGNOSIS — I10 Essential (primary) hypertension: Secondary | ICD-10-CM

## 2020-07-07 DIAGNOSIS — H40053 Ocular hypertension, bilateral: Secondary | ICD-10-CM

## 2020-07-07 DIAGNOSIS — Z961 Presence of intraocular lens: Secondary | ICD-10-CM

## 2020-07-07 DIAGNOSIS — H25811 Combined forms of age-related cataract, right eye: Secondary | ICD-10-CM

## 2020-07-07 DIAGNOSIS — I639 Cerebral infarction, unspecified: Secondary | ICD-10-CM

## 2020-07-07 DIAGNOSIS — E113313 Type 2 diabetes mellitus with moderate nonproliferative diabetic retinopathy with macular edema, bilateral: Secondary | ICD-10-CM

## 2020-07-07 DIAGNOSIS — H25813 Combined forms of age-related cataract, bilateral: Secondary | ICD-10-CM

## 2020-07-07 DIAGNOSIS — E113412 Type 2 diabetes mellitus with severe nonproliferative diabetic retinopathy with macular edema, left eye: Secondary | ICD-10-CM

## 2020-07-07 DIAGNOSIS — H35033 Hypertensive retinopathy, bilateral: Secondary | ICD-10-CM

## 2020-07-07 MED ORDER — BEVACIZUMAB CHEMO INJECTION 1.25MG/0.05ML SYRINGE FOR KALEIDOSCOPE
1.2500 mg | INTRAVITREAL | Status: AC | PRN
  Administered 2020-07-07: 1.25 mg via INTRAVITREAL

## 2020-07-08 ENCOUNTER — Telehealth: Payer: Self-pay | Admitting: Internal Medicine

## 2020-07-08 ENCOUNTER — Other Ambulatory Visit: Payer: Self-pay | Admitting: Internal Medicine

## 2020-07-08 DIAGNOSIS — E1159 Type 2 diabetes mellitus with other circulatory complications: Secondary | ICD-10-CM

## 2020-07-08 NOTE — Telephone Encounter (Signed)
Ozempic? Victoza?

## 2020-07-08 NOTE — Telephone Encounter (Signed)
Prior Auth started for Trulicity Key: [id number]

## 2020-07-08 NOTE — Telephone Encounter (Signed)
Patient called to let Dr. Jerilee Hoh know that the isn't doesn't cover Dulaglutide (TRULICITY) [id number] SOPN and would like to know if there is something else that can be called in that the insurance will cover.  Please advise

## 2020-07-16 NOTE — Progress Notes (Shared)
[facility] Note  07/21/2020     CHIEF COMPLAINT Patient presents for No chief complaint on file.   HISTORY OF PRESENT ILLNESS: Bill [name] is a 40 y.o. male who presents to the clinic today for:     Referring physician: Isaac Bliss, Rayford Halsted, MD [place],  [place] [address]  HISTORICAL INFORMATION:   Selected notes from the MEDICAL RECORD NUMBER Referred by Dr. [name] for concern of severe NPDR / PDR OU   CURRENT MEDICATIONS: No current outpatient medications on file. (Ophthalmic Drugs)   No current facility-administered medications for this visit. (Ophthalmic Drugs)   Current Outpatient Medications (Other)  Medication Sig  . aspirin 81 MG EC tablet Take 81 mg by mouth as needed.  Marland Kitchen atorvastatin (LIPITOR) 10 MG tablet Take 1 tablet (10 mg total) by mouth daily.  [name] 1 GM 210 MG(Fe) tablet Take 210 mg by mouth 3 (three) times daily with meals.  . Dulaglutide (TRULICITY) [id number] [id number] SOPN Inject 0.75 mg into the skin once a week.  . ferric citrate (AURYXIA) 1 GM 210 MG(Fe) tablet Take 210 mg by mouth as directed.  . fluticasone (FLONASE) 50 MCG/ACT nasal spray Place 1 spray into both nostrils daily as needed for allergies or rhinitis.  . multivitamin (RENA-VIT) TABS tablet Take 1 tablet by mouth daily.  . sertraline (ZOLOFT) 50 MG tablet TAKE 1 TABLET(50 MG) BY MOUTH DAILY   No current facility-administered medications for this visit. (Other)      REVIEW OF SYSTEMS:    ALLERGIES Allergies  Allergen Reactions  . Food     Melons-itchy/headaches    PAST MEDICAL HISTORY Past Medical History:  Diagnosis Date  . Anemia   . Anxiety    situational  . Diabetes mellitus without complication ([place])   . Diabetic retinopathy ([place])    PDR OU  . ESRD on dialysis [facility])    Dialyzing T[place]Th-S  . History of blood transfusion   . Hypertension   . PONV (postoperative nausea and vomiting)    03/22/2019- only  15 years ago  . Renal disorder   . Stroke [facility]) 2019   vision   Past Surgical History:  Procedure Laterality Date  . A/V FISTULAGRAM N/A 03/11/2019   Procedure: A/V FISTULAGRAM - Left Arm;  Surgeon: Waynetta Sandy, MD;  Location: [place] [facility];  Service: Cardiovascular;  Laterality: N/A;  . arm surgery Right    Fracture  . AV FISTULA PLACEMENT Left 01/25/2019   Procedure: ARTERIOVENOUS (AV) FISTULA CREATION  LEFT UPPER  ARM;  Surgeon: Serafina Mitchell, MD;  Location: [place];  Service: Vascular;  Laterality: Left;  . BASCILIC VEIN TRANSPOSITION Left 03/27/2019   Procedure: SECOND STAGE BASILIC VEIN TRANSPOSITION LEFT ARM;  Surgeon: Serafina Mitchell, MD;  Location: [place];  Service: Vascular;  Laterality: Left;  . WISDOM TOOTH EXTRACTION      FAMILY HISTORY Family History  Problem Relation Age of Onset  . Diabetes Mellitus II Mother   . Hypertension Mother   . Cataracts Mother   . Diabetes Mellitus II Father   . Stroke Father   . Hypertension Father   . Glaucoma Maternal Grandmother   . Amblyopia Neg Hx   . Blindness Neg Hx   . Macular degeneration Neg Hx   . Retinal detachment Neg Hx   . Strabismus Neg Hx   . Retinitis pigmentosa Neg Hx     SOCIAL HISTORY  Social History   Tobacco Use  . Smoking status: Never Smoker  . Smokeless tobacco: Never Used  Vaping Use  . Vaping Use: Never used  Substance Use Topics  . Alcohol use: Not Currently  . Drug use: Not Currently         OPHTHALMIC EXAM:  Not recorded     IMAGING AND PROCEDURES  Imaging and Procedures for '@TODAY'$ @           ASSESSMENT/PLAN: No diagnosis found. 1-3. Proliferative diabetic retinopathy OU  - s/p PRP OD (10.02.19), fill-in (01.22.20), fill in OD (02.12.20)   - s/p PRP OS (05/02/2018), fill-in (02.19.20)  - s/p IVK #1 OD (01.22.20), OS (02.12.20)  - s/p IVA OD #1 (10.21.20), #2 (12.02.20), #3 (01.13.21), #4 (02.10.21), #5 (04.19.21), #6 (05.21.21), #7 (06.21.21), #8  (08.19.21), #9 (10.11.21), #10 (12.01.21), #11 (1.25.22)  - s/p IVA OS #1 (12.02.20), #2 (04.19.21), #3 (05.21.21), #4 (06.21.21), #5 (08.19.21), #6 (10.11.21), #7 (12.01.21), #8 (1.25.22)  - FA 8.19.19 shows leaking MA, patches of capillary nonperfusion OU  - repeat FA 10.11.21 shows persistent NV OU, large patches of vascular nonperfusion OU (OS>OD) -- would benefit from some fill in PRP OU (OS first)  - discussed the importance of compliance with medical therapy and follow ups  - BCVA 20/40 OU (improved from 20/50 OU)  - exam shows interval decrease in VH, preretinal and subhyaloid hemorrhage, persistent preretinal fibrosis OU  - OCT shows OD: Mild Interval improvement in IRF/cystic changes; OS: Mild interval improvement in vitreous opacities, persistent retinal atrophy  - VH precautions reviewed -- minimize activities, keep head elevated, avoid ASA/NSAIDs/blood thinners as able  - pt now on heparin  - discussed possibility of surgery being needed in the future to stabilize eye and prevent further progression of diabetic eye disease  - [place] informed consent form signed and scanned on 01.13.2021  - f/u 2 wks -- DFE/OCT, PRP OS  4,5. 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 [name] in mid August 2019 with a 2 wk history intractable headaches and decreased vision OU  - found to have BP of 170s/90s -- sent to ED and was admitted to [facility] where SBP was up to 200  - discussed importance of tight BP control  - pt reports improved compliance with BP medications and improvement in BP  6. 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 Limited 2 sequence MRI head: Acute RIGHT periatrial subcentimeter  nonhemorrhagic infarct, in region of optic tract.  - could be limiting vision -- will check formal visual field once macular edema improves  7. Ocular Hypertention OU  - today, IOP OD 15, OS 18  - patient reports not  taking cosopt bid OU for about a month--IOP OK OU  8. Mixed cataract OD - The symptoms of cataract, surgical options, and treatments and risks were discussed with patient. - discussed diagnosis and progression - under the expert management of Dr. Kathlen Mody - clear from a retina standpoint to proceed with cataract surgery when patient and surgeon are ready  9. Pseudophakia OS  - s/p CE/IOL (07.29.21, Dr. Kathlen Mody)  - IOL in good position, doing well - monitor   Ophthalmic Meds Ordered this visit:  No orders of the defined types were placed in this encounter.      No follow-ups on file.  There are no Patient Instructions on file for this visit.  This document serves as a  record of services personally performed by Gardiner Sleeper, MD, PhD. It was created on their behalf by Estill Bakes, COT an ophthalmic technician. The creation of this record is the provider's dictation and/or activities during the visit.    Electronically signed by: Estill Bakes, COT 2.3.22 @ 10:17 AM  Abbreviations: M myopia (nearsighted); A astigmatism; H hyperopia (farsighted); P presbyopia; Mrx spectacle prescription;  CTL contact lenses; OD right eye; OS left eye; OU both eyes  XT exotropia; ET esotropia; PEK punctate epithelial keratitis; PEE punctate epithelial erosions; DES dry eye syndrome; MGD meibomian gland dysfunction; ATs artificial tears; PFAT's preservative free artificial tears; [place] nuclear sclerotic cataract; PSC posterior subcapsular cataract; ERM epi-retinal membrane; PVD posterior vitreous detachment; RD retinal detachment; DM diabetes mellitus; DR diabetic retinopathy; NPDR non-proliferative diabetic retinopathy; PDR proliferative diabetic retinopathy; CSME clinically significant macular edema; DME diabetic macular edema; dbh dot blot hemorrhages; CWS cotton wool spot; POAG primary open angle glaucoma; C/D cup-to-disc ratio; HVF humphrey visual field; GVF goldmann visual field; OCT optical coherence  tomography; IOP intraocular pressure; BRVO Branch retinal vein occlusion; CRVO central retinal vein occlusion; CRAO central retinal artery occlusion; BRAO branch retinal artery occlusion; RT retinal tear; SB scleral buckle; PPV pars plana vitrectomy; VH Vitreous hemorrhage; PRP panretinal laser photocoagulation; IVK intravitreal kenalog; VMT vitreomacular traction; MH Macular hole;  NVD neovascularization of the disc; NVE neovascularization elsewhere; AREDS age related eye disease study; ARMD age related macular degeneration; POAG primary open angle glaucoma; EBMD epithelial/anterior basement membrane dystrophy; ACIOL anterior chamber intraocular lens; IOL intraocular lens; PCIOL posterior chamber intraocular lens; Phaco/IOL phacoemulsification with intraocular lens placement; [place] photorefractive keratectomy; LASIK laser assisted in situ keratomileusis; HTN hypertension; DM diabetes mellitus; COPD chronic obstructive pulmonary disease

## 2020-07-16 NOTE — Telephone Encounter (Signed)
This request has received a Favorable outcome.  Request Reference Number: [id number]. TRULICITY INJ [id number] is approved through 07/08/2021. Your patient may now fill this prescription and it will be covered.

## 2020-07-21 ENCOUNTER — Encounter (INDEPENDENT_AMBULATORY_CARE_PROVIDER_SITE_OTHER): Payer: 59 | Admitting: Ophthalmology

## 2020-07-21 DIAGNOSIS — I639 Cerebral infarction, unspecified: Secondary | ICD-10-CM

## 2020-07-21 DIAGNOSIS — H3581 Retinal edema: Secondary | ICD-10-CM

## 2020-07-21 DIAGNOSIS — H35033 Hypertensive retinopathy, bilateral: Secondary | ICD-10-CM

## 2020-07-21 DIAGNOSIS — I10 Essential (primary) hypertension: Secondary | ICD-10-CM

## 2020-07-21 DIAGNOSIS — H4313 Vitreous hemorrhage, bilateral: Secondary | ICD-10-CM

## 2020-07-21 DIAGNOSIS — H40053 Ocular hypertension, bilateral: Secondary | ICD-10-CM

## 2020-07-21 DIAGNOSIS — E113513 Type 2 diabetes mellitus with proliferative diabetic retinopathy with macular edema, bilateral: Secondary | ICD-10-CM

## 2020-07-21 DIAGNOSIS — H25811 Combined forms of age-related cataract, right eye: Secondary | ICD-10-CM

## 2020-07-21 DIAGNOSIS — Z961 Presence of intraocular lens: Secondary | ICD-10-CM

## 2020-07-28 ENCOUNTER — Encounter (INDEPENDENT_AMBULATORY_CARE_PROVIDER_SITE_OTHER): Payer: 59 | Admitting: Ophthalmology

## 2020-07-29 NOTE — Progress Notes (Signed)
[facility] Note  07/30/2020     CHIEF COMPLAINT Patient presents for Retina Follow Up   HISTORY OF PRESENT ILLNESS: Bill [name] is a 40 y.o. male who presents to the clinic today for:   HPI    Retina Follow Up    Patient presents with  Diabetic Retinopathy.  In both eyes.  Duration of 3 weeks.  Since onset it is stable.  I, the attending physician,  performed the HPI with the patient and updated documentation appropriately.          Comments    3 week follow up PDR OU with PRP OS today-  Vision stable per pt OU.  BS 170's yesterday afternoon A1C 6.5       Last edited by [name], MD on 07/30/2020 12:28 PM. (History)    pt here for PRP OS today  Referring physician: Isaac Bliss, Rayford Halsted, MD [place] [address],  [place] [id number]  HISTORICAL INFORMATION:   Selected notes from the MEDICAL RECORD NUMBER Referred by Dr. [name] for concern of severe NPDR / PDR OU; LEE-  Ocular Hx-  PMH-     CURRENT MEDICATIONS: Current Outpatient Medications (Ophthalmic Drugs)  Medication Sig  . prednisoLONE acetate (PRED FORTE) 1 % ophthalmic suspension Place 1 drop into the left eye 4 (four) times daily for 7 days.   No current facility-administered medications for this visit. (Ophthalmic Drugs)   Current Outpatient Medications (Other)  Medication Sig  . aspirin 81 MG EC tablet Take 81 mg by mouth as needed.  Marland Kitchen atorvastatin (LIPITOR) 10 MG tablet Take 1 tablet (10 mg total) by mouth daily.  [name] 1 GM 210 MG(Fe) tablet Take 210 mg by mouth 3 (three) times daily with meals.  . Dulaglutide (TRULICITY) [id number] SOPN Inject 0.75 mg into the skin once a week.  . ferric citrate (AURYXIA) 1 GM 210 MG(Fe) tablet Take 210 mg by mouth as directed.  . fluticasone (FLONASE) 50 MCG/ACT nasal spray Place 1 spray into both nostrils daily as needed for allergies or rhinitis.  . multivitamin (RENA-VIT) TABS tablet Take 1 tablet by  mouth daily.  . sertraline (ZOLOFT) 50 MG tablet TAKE 1 TABLET(50 MG) BY MOUTH DAILY   No current facility-administered medications for this visit. (Other)      REVIEW OF SYSTEMS: ROS    Positive for: Neurological, Genitourinary, Endocrine, Cardiovascular, Eyes, Psychiatric   Negative for: Constitutional, Gastrointestinal, Skin, Musculoskeletal, HENT, Respiratory, Allergic/Imm, Heme/Lymph   Last edited by Leonie Douglas, COA on 07/30/2020  9:42 AM. (History)       ALLERGIES Allergies  Allergen Reactions  . Food     Melons-itchy/headaches    PAST MEDICAL HISTORY Past Medical History:  Diagnosis Date  . Anemia   . Anxiety    situational  . Diabetes mellitus without complication ([place])   . Diabetic retinopathy ([place])    PDR OU  . ESRD on dialysis [facility])    Dialyzing T-Th-S  . History of blood transfusion   . Hypertension   . PONV (postoperative nausea and vomiting)    03/22/2019- only 15 years ago  . Renal disorder   . Stroke [facility]) 2019   vision   Past Surgical History:  Procedure Laterality Date  . A/V FISTULAGRAM N/A 03/11/2019   Procedure: A/V FISTULAGRAM - Left Arm;  Surgeon: Waynetta Sandy, MD;  Location: [place] [facility];  Service: Cardiovascular;  Laterality: N/A;  .  arm surgery Right    Fracture  . AV FISTULA PLACEMENT Left 01/25/2019   Procedure: ARTERIOVENOUS (AV) FISTULA CREATION  LEFT UPPER  ARM;  Surgeon: Serafina Mitchell, MD;  Location: [place];  Service: Vascular;  Laterality: Left;  . BASCILIC VEIN TRANSPOSITION Left 03/27/2019   Procedure: SECOND STAGE BASILIC VEIN TRANSPOSITION LEFT ARM;  Surgeon: Serafina Mitchell, MD;  Location: [place];  Service: Vascular;  Laterality: Left;  . WISDOM TOOTH EXTRACTION      FAMILY HISTORY Family History  Problem Relation Age of Onset  . Diabetes Mellitus II Mother   . Hypertension Mother   . Cataracts Mother   . Diabetes Mellitus II Father   . Stroke Father   . Hypertension Father   . Glaucoma Maternal  Grandmother   . Amblyopia Neg Hx   . Blindness Neg Hx   . Macular degeneration Neg Hx   . Retinal detachment Neg Hx   . Strabismus Neg Hx   . Retinitis pigmentosa Neg Hx     SOCIAL HISTORY Social History   Tobacco Use  . Smoking status: Never Smoker  . Smokeless tobacco: Never Used  Vaping Use  . Vaping Use: Never used  Substance Use Topics  . Alcohol use: Not Currently  . Drug use: Not Currently         OPHTHALMIC EXAM:  Base Eye Exam    Visual Acuity (Snellen - Linear)      Right Left   Dist [place] 20/80 -2 20/60   Dist ph  20/60 +1 NI  Patient was really struggling with vision today when checking.       Tonometry (Tonopen, 9:53 AM)      Right Left   Pressure 16 13       Pupils      Dark Light Shape React APD   Right 4 3 Round Brisk None   Left 4 3 Round Brisk None       Visual Fields (Counting fingers)      Left Right    Full Full       Extraocular Movement      Right Left    Full Full       Neuro/Psych    Oriented x3: Yes   Mood/Affect: Normal       Dilation    Both eyes: 1.0% Mydriacyl, 2.5% Phenylephrine @ 9:53 AM        Slit Lamp and Fundus Exam    Slit Lamp Exam      Right Left   Lids/Lashes Normal Normal   Conjunctiva/Sclera White and quiet White and quiet   Cornea Trace Punctate epithelial erosions Trace Punctate epithelial erosions   Anterior Chamber Deep and quiet Deep and quiet   Iris Round and dilated, No NVI Round and dilated, no NVI   Lens 1+ Nuclear sclerosis, 1-2+ Cortical cataract, 2+Posterior subcapsular cataract PC IOL in good position   Vitreous VH improved, scattered fibrosis superior to disc, blood stained vitreous condensations turning white -- slightly improving, old blood clots settled inferiorly and turning white Syneresis, white blood stained vitreous condensations settling inferiorly and improving       Fundus Exam      Right Left   Disc 2-3+Pallor, sharp rim, fibrosis 2-3+Pallor, Sharp rim, Compact,  +fibrosis/regressing NVD, no heme   C/D Ratio 0.1 0.2   Macula Blunted foveal reflex, +fibrosis, +exudates/IRH, +MA, macular pseudo hole like appearance Flat, blunted foveal reflex, atrophic, flat choroidal nevus superior macula  Vessels Severe attenuation, Tortuous, +fibrosis, +NV inferior to disc Sclerotic arterioles, Tortuous, +NV, fibrosis along superior arcades, severe Vascular attenuation   Periphery Attached, good 360 PRP, room for posterior fill in if needed, tractional fibrosis superior and along arcades, greatest above disc Attached; 360 PRP in place -- room for posterior fill in, scattered tractional fibrosis        Refraction    Manifest Refraction      Sphere Cylinder Dist VA   Right -1.25 Sphere 20/70+1   Left [place]   NI  Unable to refract OS because pt has to move around to see.           IMAGING AND PROCEDURES  Imaging and Procedures for '@TODAY'$ @  OCT, Retina - OU - Both Eyes       Right Eye Quality was good. Central Foveal Thickness: 324. Progression has improved. Findings include abnormal foveal contour, intraretinal hyper-reflective material, preretinal fibrosis, vitreous traction, epiretinal membrane, no SRF, macular pucker, inner retinal atrophy, intraretinal fluid (Interval release of central VMT, +IRF, persistent tractional edema along IT arcades caught on widefield).   Left Eye Quality was good. Central Foveal Thickness: 224. Progression has been stable. Findings include abnormal foveal contour, intraretinal hyper-reflective material, no SRF, inner retinal atrophy, epiretinal membrane, preretinal fibrosis, outer retinal atrophy, vitreomacular adhesion , no IRF, vitreous traction (Mild interval improvement in vitreous opacities, persistent retinal atrophy).   Notes *Images captured and stored on drive  Diagnosis / Impression:  PDR w/ tractional fibrosis OU OD: Interval release of central VMT, +IRF, persistent tractional edema along IT arcades caught on  widefield OS: Mild interval improvement in vitreous opacities, persistent retinal atrophy  Clinical management:  See below  Abbreviations: NFP - Normal foveal profile. CME - cystoid macular edema. PED - pigment epithelial detachment. IRF - intraretinal fluid. SRF - subretinal fluid. EZ - ellipsoid zone. ERM - epiretinal membrane. ORA - outer retinal atrophy. ORT - outer retinal tubulation. SRHM - subretinal hyper-reflective material         Panretinal Photocoagulation - OS - Left Eye       LASER PROCEDURE NOTE  Diagnosis:   Proliferative Diabetic Retinopathy, LEFT EYE  Procedure:  Pan-retinal photocoagulation using slit lamp laser, LEFT EYE, fill in  Anesthesia:  Topical  Surgeon: [name], MD, PhD   Informed consent obtained, operative eye marked, and time out performed prior to initiation of laser.   [place] [id number] slit lamp laser Pattern: 2x2 square Power: 260 mW Duration: 30 msec  Spot size: 200 microns  # spots: [id number] spots  Complications: None.  RTC: Feb 22 or later -- DFE/OCT, possible injection(s)  Patient tolerated the procedure well and received written and verbal post-procedure care information/education.                  ASSESSMENT/PLAN:   ICD-10-CM   1. Proliferative diabetic retinopathy of both eyes with macular edema associated with type 2 diabetes mellitus ([place])  [id number] Panretinal Photocoagulation - OS - Left Eye  2. Vitreous hemorrhage of both eyes ([place])  H43.13   3. Retinal edema  H35.81 OCT, Retina - OU - Both Eyes  4. Essential hypertension  I10   5. Hypertensive retinopathy of both eyes  H35.033   6. Acute CVA (cerebrovascular accident) ([place])  I63.9   7. Ocular hypertension, bilateral  H40.053   8. Combined forms of age-related cataract of right eye  H25.811   9. Pseudophakia  Z96.1    1-3. Proliferative diabetic retinopathy OU  -  s/p PRP OD (10.02.19), fill-in (01.22.20), fill in OD (02.12.20)   - s/p PRP OS (05/02/2018),  fill-in (02.19.20)  - s/p IVK #1 OD (01.22.20), OS (02.12.20)  - s/p IVA OD #1 (10.21.20), #2 (12.02.20), #3 (01.13.21), #4 (02.10.21), #5 (04.19.21), #6 (05.21.21), #7 (06.21.21), #8 (08.19.21), #9 (10.11.21), #10 (12.01.21), #11 (01.25.22)  - s/p IVA OS #1 (12.02.20), #2 (04.19.21), #3 (05.21.21), #4 (06.21.21), #5 (08.19.21), #6 (10.11.21), #7 (12.01.21), #8 (01.25.22)  - FA 8.19.19 shows leaking MA, patches of capillary nonperfusion OU  - repeat FA 10.11.21 shows persistent NV OU, large patches of vascular nonperfusion OU (OS>OD) -- would benefit from some fill in PRP OU (OS first)  - BCVA 20/60 OU (decreased from 20/40 OU)  - exam shows interval decrease in VH, preretinal and subhyaloid hemorrhage, persistent preretinal fibrosis OU  - OCT shows [id number] release of central VMT, +IRF, persistent tractional edema along IT arcades caught on widefield  ; OS: Mild interval improvement in vitreous opacities, persistent retinal atrophy  - VH precautions reviewed -- minimize activities, keep head elevated, avoid ASA/NSAIDs/blood thinners as able  - pt now on heparin  - discussed possibility of surgery being needed in the future to stabilize eye and prevent further progression of diabetic eye disease  - recommend PRP laser OS today, 02.17.22  - pt wishes to proceed  - RBA of procedure discussed, questions answered  - informed consent obtained and signed  - see procedure note  - start PF qid OS for 1 week then stop  - [place] informed consent form signed and scanned on 01.13.2021  - f/u February 22 or later -- DFE/OCT, possible injection  4,5. 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 [name] in mid August 2019 with a 2 wk history intractable headaches and decreased vision OU  - found to have BP of 170s/90s -- sent to ED and was admitted to [facility] where SBP was up to 200  - discussed importance of tight BP control   - pt reports improved compliance  with BP medications and improvement in BP  6. 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 Limited 2 sequence MRI head: Acute RIGHT periatrial subcentimeter  nonhemorrhagic infarct, in region of optic tract.  - could be limiting vision -- will check formal visual field once macular edema improves  7. Ocular Hypertention OU  - today, IOP OD 16, OS 13  - patient reports not taking cosopt bid OU for about a month--IOP OK OU  8. Mixed cataract OD - The symptoms of cataract, surgical options, and treatments and risks were discussed with patient. - discussed diagnosis and progression - under the expert management of Dr. Kathlen Mody - clear from a retina standpoint to proceed with cataract surgery when patient and surgeon are ready  9. Pseudophakia OS  - s/p CE/IOL (07.29.21, Dr. Kathlen Mody)  - IOL in good position, doing well - monitor   Ophthalmic Meds Ordered this visit:  Meds ordered this encounter  Medications  . prednisoLONE acetate (PRED FORTE) 1 % ophthalmic suspension    Sig: Place 1 drop into the left eye 4 (four) times daily for 7 days.    Dispense:  10 mL    Refill:  0       Return for 08/04/2020 or later DFE, OCT.  There are no Patient Instructions on file for this visit.    This document serves as a record of services  personally performed by Gardiner Sleeper, MD, PhD. It was created on their behalf by Roselee Nova, COMT. The creation of this record is the provider's dictation and/or activities during the visit.  Electronically signed by: Roselee Nova, COMT 07/30/20 12:32 PM  Gardiner Sleeper, M.D., Ph.D. Diseases & Surgery of the Retina and Vitreous [facility]  I have reviewed the above documentation for accuracy and completeness, and I agree with the above. Gardiner Sleeper, M.D., Ph.D. 07/30/20 12:32 PM   Abbreviations: M myopia (nearsighted); A astigmatism; H hyperopia (farsighted); P presbyopia; Mrx spectacle  prescription;  CTL contact lenses; OD right eye; OS left eye; OU both eyes  XT exotropia; ET esotropia; PEK punctate epithelial keratitis; PEE punctate epithelial erosions; DES dry eye syndrome; MGD meibomian gland dysfunction; ATs artificial tears; PFAT's preservative free artificial tears; [place] nuclear sclerotic cataract; PSC posterior subcapsular cataract; ERM epi-retinal membrane; PVD posterior vitreous detachment; RD retinal detachment; DM diabetes mellitus; DR diabetic retinopathy; NPDR non-proliferative diabetic retinopathy; PDR proliferative diabetic retinopathy; CSME clinically significant macular edema; DME diabetic macular edema; dbh dot blot hemorrhages; CWS cotton wool spot; POAG primary open angle glaucoma; C/D cup-to-disc ratio; HVF humphrey visual field; GVF goldmann visual field; OCT optical coherence tomography; IOP intraocular pressure; BRVO Branch retinal vein occlusion; CRVO central retinal vein occlusion; CRAO central retinal artery occlusion; BRAO branch retinal artery occlusion; RT retinal tear; SB scleral buckle; PPV pars plana vitrectomy; VH Vitreous hemorrhage; PRP panretinal laser photocoagulation; IVK intravitreal kenalog; VMT vitreomacular traction; MH Macular hole;  NVD neovascularization of the disc; NVE neovascularization elsewhere; AREDS age related eye disease study; ARMD age related macular degeneration; POAG primary open angle glaucoma; EBMD epithelial/anterior basement membrane dystrophy; ACIOL anterior chamber intraocular lens; IOL intraocular lens; PCIOL posterior chamber intraocular lens; Phaco/IOL phacoemulsification with intraocular lens placement; [place] photorefractive keratectomy; LASIK laser assisted in situ keratomileusis; HTN hypertension; DM diabetes mellitus; COPD chronic obstructive pulmonary disease

## 2020-07-30 ENCOUNTER — Encounter (INDEPENDENT_AMBULATORY_CARE_PROVIDER_SITE_OTHER): Payer: Self-pay | Admitting: Ophthalmology

## 2020-07-30 ENCOUNTER — Ambulatory Visit (INDEPENDENT_AMBULATORY_CARE_PROVIDER_SITE_OTHER): Payer: 59 | Admitting: Ophthalmology

## 2020-07-30 ENCOUNTER — Other Ambulatory Visit: Payer: Self-pay

## 2020-07-30 DIAGNOSIS — H3581 Retinal edema: Secondary | ICD-10-CM | POA: Diagnosis not present

## 2020-07-30 DIAGNOSIS — H4313 Vitreous hemorrhage, bilateral: Secondary | ICD-10-CM

## 2020-07-30 DIAGNOSIS — Z961 Presence of intraocular lens: Secondary | ICD-10-CM

## 2020-07-30 DIAGNOSIS — H40053 Ocular hypertension, bilateral: Secondary | ICD-10-CM

## 2020-07-30 DIAGNOSIS — H35033 Hypertensive retinopathy, bilateral: Secondary | ICD-10-CM

## 2020-07-30 DIAGNOSIS — I639 Cerebral infarction, unspecified: Secondary | ICD-10-CM

## 2020-07-30 DIAGNOSIS — I10 Essential (primary) hypertension: Secondary | ICD-10-CM | POA: Diagnosis not present

## 2020-07-30 DIAGNOSIS — H25811 Combined forms of age-related cataract, right eye: Secondary | ICD-10-CM

## 2020-07-30 DIAGNOSIS — E113513 Type 2 diabetes mellitus with proliferative diabetic retinopathy with macular edema, bilateral: Secondary | ICD-10-CM

## 2020-07-30 MED ORDER — PREDNISOLONE ACETATE 1 % OP SUSP: 1.0000 [drp] | Freq: Four times a day (QID) | OPHTHALMIC | 0 refills | Status: AC

## 2020-08-04 NOTE — Progress Notes (Signed)
[facility] Note  08/11/2020     CHIEF COMPLAINT Patient presents for Retina Follow Up   HISTORY OF PRESENT ILLNESS: Bill [name] is a 40 y.o. male who presents to the clinic today for:   HPI    Retina Follow Up    Patient presents with  Other.  In left eye.  This started 1.5 weeks ago.  I, the attending physician,  performed the HPI with the patient and updated documentation appropriately.          Comments    Patient here for 1 1/2 weeks retina follow up for PRP OS. Patient states vision doing ok. No changes. No eye pain.        Last edited by [name], MD on 08/11/2020  9:23 AM. (History)    pt   Referring physician: Isaac Bliss, Rayford Halsted, MD [place],  [place] [address]  HISTORICAL INFORMATION:   Selected notes from the MEDICAL RECORD NUMBER Referred by Dr. [name] for concern of severe NPDR / PDR OU   CURRENT MEDICATIONS: No current outpatient medications on file. (Ophthalmic Drugs)   No current facility-administered medications for this visit. (Ophthalmic Drugs)   Current Outpatient Medications (Other)  Medication Sig  . aspirin 81 MG EC tablet Take 81 mg by mouth as needed.  Marland Kitchen atorvastatin (LIPITOR) 10 MG tablet Take 1 tablet (10 mg total) by mouth daily.  [name] 1 GM 210 MG(Fe) tablet Take 210 mg by mouth 3 (three) times daily with meals.  . Dulaglutide (TRULICITY) [id number] SOPN Inject 0.75 mg into the skin once a week.  . ferric citrate (AURYXIA) 1 GM 210 MG(Fe) tablet Take 210 mg by mouth as directed.  . fluticasone (FLONASE) 50 MCG/ACT nasal spray Place 1 spray into both nostrils daily as needed for allergies or rhinitis.  . multivitamin (RENA-VIT) TABS tablet Take 1 tablet by mouth daily.  . sertraline (ZOLOFT) 50 MG tablet TAKE 1 TABLET(50 MG) BY MOUTH DAILY   No current facility-administered medications for this visit. (Other)      REVIEW OF SYSTEMS: ROS    Positive for:  Neurological, Genitourinary, Endocrine, Cardiovascular, Eyes, Psychiatric   Negative for: Constitutional, Gastrointestinal, Skin, Musculoskeletal, HENT, Respiratory, Allergic/Imm, Heme/Lymph   Last edited by [name], COA on 08/11/2020  9:21 AM. (History)       ALLERGIES Allergies  Allergen Reactions  . Food     Melons-itchy/headaches    PAST MEDICAL HISTORY Past Medical History:  Diagnosis Date  . Anemia   . Anxiety    situational  . Diabetes mellitus without complication ([place])   . Diabetic retinopathy ([place])    PDR OU  . ESRD on dialysis [facility])    Dialyzing T-Th-S  . History of blood transfusion   . Hypertension   . PONV (postoperative nausea and vomiting)    03/22/2019- only 15 years ago  . Renal disorder   . Stroke [facility]) 2019   vision   Past Surgical History:  Procedure Laterality Date  . A/V FISTULAGRAM N/A 03/11/2019   Procedure: A/V FISTULAGRAM - Left Arm;  Surgeon: Waynetta Sandy, MD;  Location: [facility];  Service: Cardiovascular;  Laterality: N/A;  . arm surgery Right    Fracture  . AV FISTULA PLACEMENT Left 01/25/2019   Procedure: ARTERIOVENOUS (AV) FISTULA CREATION  LEFT UPPER  ARM;  Surgeon: Serafina Mitchell, MD;  Location: [facility];  Service: Vascular;  Laterality: Left;  . BASCILIC VEIN TRANSPOSITION Left 03/27/2019   Procedure: SECOND STAGE BASILIC VEIN TRANSPOSITION LEFT ARM;  Surgeon: Serafina Mitchell, MD;  Location: [facility];  Service: Vascular;  Laterality: Left;  . WISDOM TOOTH EXTRACTION      FAMILY HISTORY Family History  Problem Relation Age of Onset  . Diabetes Mellitus II Mother   . Hypertension Mother   . Cataracts Mother   . Diabetes Mellitus II Father   . Stroke Father   . Hypertension Father   . Glaucoma Maternal Grandmother   . Amblyopia Neg Hx   . Blindness Neg Hx   . Macular degeneration Neg Hx   . Retinal detachment Neg Hx   . Strabismus Neg Hx   . Retinitis pigmentosa Neg Hx     SOCIAL HISTORY Social  History   Tobacco Use  . Smoking status: Never Smoker  . Smokeless tobacco: Never Used  Vaping Use  . Vaping Use: Never used  Substance Use Topics  . Alcohol use: Not Currently  . Drug use: Not Currently         OPHTHALMIC EXAM:  Base Eye Exam    Visual Acuity (Snellen - Linear)      Right Left   Dist [place] 20/50 20/50 -2   Dist ph [place] NI 20/40 -2       Tonometry (Tonopen, 9:17 AM)      Right Left   Pressure 17 16       Pupils      Dark Light Shape React APD   Right 4 3 Round Brisk None   Left 4 3 Round Brisk None       Visual Fields (Counting fingers)      Left Right    Full Full       Extraocular Movement      Right Left    Full Full       Neuro/Psych    Oriented x3: Yes   Mood/Affect: Normal       Dilation    Both eyes: 1.0% Mydriacyl, 2.5% Phenylephrine @ 9:17 AM        Slit Lamp and Fundus Exam    Slit Lamp Exam      Right Left   Lids/Lashes Normal Normal   Conjunctiva/Sclera White and quiet White and quiet   Cornea Trace Punctate epithelial erosions Trace Punctate epithelial erosions   Anterior Chamber Deep and quiet Deep and quiet   Iris Round and dilated, No NVI Round and dilated, no NVI   Lens 1+ Nuclear sclerosis, 1-2+ Cortical cataract, 2+Posterior subcapsular cataract PC IOL in good position   Vitreous VH improved, scattered fibrosis superior to disc, white blood stained vitreous condensations -- slightly improving, old, white blood clots settled inferiorly Syneresis, white blood stained vitreous condensations settling inferiorly and improving       Fundus Exam      Right Left   Disc 2-3+Pallor, sharp rim, fibrosis 2-3+Pallor, Sharp rim, Compact, +fibrosis/regressing NVD, no heme   C/D Ratio 0.1 0.2   Macula Blunted foveal reflex, +fibrosis, +exudates/IRH, +MA, macular pseudo hole like appearance Flat, blunted foveal reflex, atrophic, flat choroidal nevus superior macula   Vessels Severe attenuation, Tortuous, +fibrosis, +NV inferior to  disc -- regressing Sclerotic arterioles, Tortuous, +NV, fibrosis along superior arcades, severe Vascular attenuation   Periphery Attached, good 360 PRP, room for posterior fill in if needed, tractional fibrosis superior and along arcades, greatest above disc Attached; 360 PRP in place -- room for  posterior fill in, scattered tractional fibrosis          IMAGING AND PROCEDURES  Imaging and Procedures for '@TODAY'$ @  OCT, Retina - OU - Both Eyes       Right Eye Quality was good. Central Foveal Thickness: 325. Progression has been stable. Findings include abnormal foveal contour, intraretinal hyper-reflective material, preretinal fibrosis, vitreous traction, epiretinal membrane, no SRF, macular pucker, inner retinal atrophy, intraretinal fluid (mild interval improvement in vitreous traction -- scattered points of release of VMT).   Left Eye Quality was good. Central Foveal Thickness: 226. Progression has been stable. Findings include abnormal foveal contour, intraretinal hyper-reflective material, no SRF, inner retinal atrophy, epiretinal membrane, preretinal fibrosis, outer retinal atrophy, vitreomacular adhesion , no IRF, vitreous traction (Mild interval improvement in vitreous opacities, persistent retinal atrophy).   Notes *Images captured and stored on drive  Diagnosis / Impression:  PDR w/ tractional fibrosis OU OD: mild interval improvement in vitreous traction -- scattered points of release of VMT OS: Mild interval improvement in vitreous opacities, persistent retinal atrophy  Clinical management:  See below  Abbreviations: NFP - Normal foveal profile. CME - cystoid macular edema. PED - pigment epithelial detachment. IRF - intraretinal fluid. SRF - subretinal fluid. EZ - ellipsoid zone. ERM - epiretinal membrane. ORA - outer retinal atrophy. ORT - outer retinal tubulation. SRHM - subretinal hyper-reflective material         Intravitreal Injection, Pharmacologic Agent - OD -  Right Eye       Time Out 08/11/2020. 9:14 AM. Confirmed correct patient, procedure, site, and patient consented.   Anesthesia Topical anesthesia was used. Anesthetic medications included Lidocaine 2%, Proparacaine 0.5%.   Procedure Preparation included 5% betadine to ocular surface, eyelid speculum. A (32g) needle was used.   Injection:  1.25 mg Bevacizumab (AVASTIN) 1.'25mg'$ /0.70m SOLN   NDC: [id number] Lot:[id number] Expiration date: 09/17/2020   Route: Intravitreal, Site: Right Eye, Waste: 0.05 mL  Post-op Post injection exam found visual acuity of at least counting fingers. The patient tolerated the procedure well. There were no complications. The patient received written and verbal post procedure care education. Post injection medications were not given.        Intravitreal Injection, Pharmacologic Agent - OS - Left Eye       Time Out 08/11/2020. 9:15 AM. Confirmed correct patient, procedure, site, and patient consented.   Anesthesia Topical anesthesia was used. Anesthetic medications included Lidocaine 2%, Proparacaine 0.5%.   Procedure Preparation included 5% betadine to ocular surface, eyelid speculum. A supplied needle was used.   Injection:  1.25 mg Bevacizumab (AVASTIN) 1.'25mg'$ /0.074mSOLN   NDC: [id number]: [id number] date: 09/21/2020   Route: Intravitreal, Site: Left Eye, Waste: 0 mL  Post-op Post injection exam found visual acuity of at least counting fingers. The patient tolerated the procedure well. There were no complications. The patient received written and verbal post procedure care education. Post injection medications were not given.                 ASSESSMENT/PLAN:   ICD-10-CM   1. Proliferative diabetic retinopathy of both eyes with macular edema associated with type 2 diabetes mellitus (HCC)  [id number] Injection, Pharmacologic Agent - OD - Right Eye    Intravitreal Injection, Pharmacologic Agent - OS - Left Eye     Bevacizumab (AVASTIN) SOLN 1.25 mg    Bevacizumab (AVASTIN) SOLN 1.25 mg  2. Vitreous hemorrhage of both eyes ([facility] H43.13   3. Retinal edema  H35.81 OCT, Retina - OU - Both Eyes  4. Essential hypertension  I10   5. Hypertensive retinopathy of both eyes  H35.033   6. Acute CVA (cerebrovascular accident) ([place])  I63.9   7. Ocular hypertension, bilateral  H40.053   8. Combined forms of age-related cataract of right eye  H25.811   9. Pseudophakia  Z96.1    1-3. Proliferative diabetic retinopathy OU  - s/p PRP OD (10.02.19), (01.22.20), (02.12.20)   - s/p PRP OS (05/02/2018), (02.19.20), (2.17.22)  - s/p IVK #1 OD (01.22.20), OS (02.12.20)  - s/p IVA OD #1 (10.21.20), #2 (12.02.20), #3 (01.13.21), #4 (02.10.21), #5 (04.19.21), #6 (05.21.21), #7 (06.21.21), #8 (08.19.21), #9 (10.11.21), #10 (12.01.21), #11 (01.25.22)  - s/p IVA OS #1 (12.02.20), #2 (04.19.21), #3 (05.21.21), #4 (06.21.21), #5 (08.19.21), #6 (10.11.21), #7 (12.01.21), #8 (01.25.22)  - FA 8.19.19 shows leaking MA, patches of capillary nonperfusion OU  - repeat FA 10.11.21 shows persistent NV OU, large patches of vascular nonperfusion OU (OS>OD) -- would benefit from some fill in PRP OD (OS done 2.17.22)  - BCVA OD: 20/50, OS: 20/40 (both improved)  - exam shows interval decrease in VH, preretinal and subhyaloid hemorrhage, persistent preretinal fibrosis OU  - OCT shows OD: mild interval improvement in vitreous traction -- scattered points of release of VMT; OS: Mild interval improvement in vitreous opacities, persistent retinal atrophy  - VH precautions reviewed -- minimize activities, keep head elevated, avoid ASA/NSAIDs/blood thinners as able  - pt on heparin  - discussed possibility of surgery being needed in the future to stabilize eye and prevent further progression of diabetic eye disease  - recommend IVA OU (OD #12 and OS #9) today, 03.01.22  - pt wishes to proceed  - RBA of procedure discussed, questions answered  - see  procedure note  - Avastin informed consent form signed and scanned on 01.13.2021  - f/u Thursday at 9:30 -- DFE/OCT, PRP OD  4,5. 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 [name] in mid August 2019 with a 2 wk history intractable headaches and decreased vision OU  - found to have BP of 170s/90s -- sent to ED and was [facility] to [facility] where SBP was up to 200  - discussed importance of tight BP control   - pt reports improved compliance with BP medications and improvement in BP  6. 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 Limited 2 sequence MRI head: Acute RIGHT periatrial subcentimeter  nonhemorrhagic infarct, in region of optic tract.  - could be limiting vision -- will check formal visual field once macular edema improves  7. Ocular Hypertention OU  - today, IOP OD 16, OS 13  - patient reports not taking cosopt bid OU for about a month--IOP OK OU  8. Mixed cataract OD - The symptoms of cataract, surgical options, and treatments and risks were discussed with patient. - discussed diagnosis and progression - under the expert management of Dr. Kathlen Mody - clear from a retina standpoint to proceed with cataract surgery when patient and surgeon are ready  9. Pseudophakia OS  - s/p CE/IOL (07.29.21, Dr. Kathlen Mody)  - IOL in good position, doing well - monitor   Ophthalmic Meds Ordered this visit:  Meds ordered this encounter  Medications  . Bevacizumab (AVASTIN) SOLN 1.25 mg  . Bevacizumab (AVASTIN) SOLN 1.25 mg       Return in about 2 days (around 08/13/2020) for  Laser PRP OD.  There are no Patient Instructions on file for this visit.   This document serves as a record of services personally performed by Gardiner Sleeper, MD, PhD. It was created on their behalf by Estill Bakes, COT an ophthalmic technician. The creation of this record is the provider's dictation and/or activities during the visit.     Electronically signed by: Estill Bakes, COT 2.22.22 @ 11:09 AM   This document serves as a record of services personally performed by Gardiner Sleeper, MD, PhD. It was created on their behalf by San Jetty. Owens Shark, OA an ophthalmic technician. The creation of this record is the provider's dictation and/or activities during the visit.    Electronically signed by: San Jetty. Owens Shark, [place] 03.01.2022 11:09 AM   Gardiner Sleeper, M.D., Ph.D. Diseases & Surgery of the Retina and Vitreous [facility]  I have reviewed the above documentation for accuracy and completeness, and I agree with the above. Gardiner Sleeper, M.D., Ph.D. 08/11/20 11:12 AM   Abbreviations: M myopia (nearsighted); A astigmatism; H hyperopia (farsighted); P presbyopia; Mrx spectacle prescription;  CTL contact lenses; OD right eye; OS left eye; OU both eyes  XT exotropia; ET esotropia; PEK punctate epithelial keratitis; PEE punctate epithelial erosions; DES dry eye syndrome; MGD meibomian gland dysfunction; ATs artificial tears; PFAT's preservative free artificial tears; [place] nuclear sclerotic cataract; PSC posterior subcapsular cataract; ERM epi-retinal membrane; PVD posterior vitreous detachment; RD retinal detachment; DM diabetes mellitus; DR diabetic retinopathy; NPDR non-proliferative diabetic retinopathy; PDR proliferative diabetic retinopathy; CSME clinically significant macular edema; DME diabetic macular edema; dbh dot blot hemorrhages; CWS cotton wool spot; POAG primary open angle glaucoma; C/D cup-to-disc ratio; HVF humphrey visual field; GVF goldmann visual field; OCT optical coherence tomography; IOP intraocular pressure; BRVO Branch retinal vein occlusion; CRVO central retinal vein occlusion; CRAO central retinal artery occlusion; BRAO branch retinal artery occlusion; RT retinal tear; SB scleral buckle; PPV pars plana vitrectomy; VH Vitreous hemorrhage; PRP panretinal laser photocoagulation; IVK intravitreal  kenalog; VMT vitreomacular traction; MH Macular hole;  NVD neovascularization of the disc; NVE neovascularization elsewhere; AREDS age related eye disease study; ARMD age related macular degeneration; POAG primary open angle glaucoma; EBMD epithelial/anterior basement membrane dystrophy; ACIOL anterior chamber intraocular lens; IOL intraocular lens; PCIOL posterior chamber intraocular lens; Phaco/IOL phacoemulsification with intraocular lens placement; [place] photorefractive keratectomy; LASIK laser assisted in situ keratomileusis; HTN hypertension; DM diabetes mellitus; COPD chronic obstructive pulmonary disease

## 2020-08-11 ENCOUNTER — Ambulatory Visit (INDEPENDENT_AMBULATORY_CARE_PROVIDER_SITE_OTHER): Payer: 59 | Admitting: Ophthalmology

## 2020-08-11 ENCOUNTER — Other Ambulatory Visit: Payer: Self-pay

## 2020-08-11 ENCOUNTER — Encounter (INDEPENDENT_AMBULATORY_CARE_PROVIDER_SITE_OTHER): Payer: Self-pay | Admitting: Ophthalmology

## 2020-08-11 DIAGNOSIS — E113513 Type 2 diabetes mellitus with proliferative diabetic retinopathy with macular edema, bilateral: Secondary | ICD-10-CM | POA: Diagnosis not present

## 2020-08-11 DIAGNOSIS — Z961 Presence of intraocular lens: Secondary | ICD-10-CM

## 2020-08-11 DIAGNOSIS — H40053 Ocular hypertension, bilateral: Secondary | ICD-10-CM

## 2020-08-11 DIAGNOSIS — I639 Cerebral infarction, unspecified: Secondary | ICD-10-CM

## 2020-08-11 DIAGNOSIS — H25811 Combined forms of age-related cataract, right eye: Secondary | ICD-10-CM

## 2020-08-11 DIAGNOSIS — H4313 Vitreous hemorrhage, bilateral: Secondary | ICD-10-CM | POA: Diagnosis not present

## 2020-08-11 DIAGNOSIS — I10 Essential (primary) hypertension: Secondary | ICD-10-CM | POA: Diagnosis not present

## 2020-08-11 DIAGNOSIS — H3581 Retinal edema: Secondary | ICD-10-CM | POA: Diagnosis not present

## 2020-08-11 DIAGNOSIS — H35033 Hypertensive retinopathy, bilateral: Secondary | ICD-10-CM

## 2020-08-11 MED ORDER — BEVACIZUMAB CHEMO INJECTION 1.25MG/0.05ML SYRINGE FOR KALEIDOSCOPE
1.2500 mg | INTRAVITREAL | Status: AC | PRN
  Administered 2020-08-11: 1.25 mg via INTRAVITREAL

## 2020-08-11 NOTE — Progress Notes (Shared)
[facility] Note  08/13/2020     CHIEF COMPLAINT Patient presents for No chief complaint on file.   HISTORY OF PRESENT ILLNESS: Bill [name] is a 40 y.o. male who presents to the clinic today for:   pt   Referring physician: Isaac Bliss, Rayford Halsted, MD [place],  [place] [address]  HISTORICAL INFORMATION:   Selected notes from the MEDICAL RECORD NUMBER Referred by Dr. [name] for concern of severe NPDR / PDR OU   CURRENT MEDICATIONS: No current outpatient medications on file. (Ophthalmic Drugs)   No current facility-administered medications for this visit. (Ophthalmic Drugs)   Current Outpatient Medications (Other)  Medication Sig  . aspirin 81 MG EC tablet Take 81 mg by mouth as needed.  Marland Kitchen atorvastatin (LIPITOR) 10 MG tablet Take 1 tablet (10 mg total) by mouth daily.  [name] 1 GM 210 MG(Fe) tablet Take 210 mg by mouth 3 (three) times daily with meals.  . Dulaglutide (TRULICITY) [id number] SOPN Inject 0.75 mg into the skin once a week.  . ferric citrate (AURYXIA) 1 GM 210 MG(Fe) tablet Take 210 mg by mouth as directed.  . fluticasone (FLONASE) 50 MCG/ACT nasal spray Place 1 spray into both nostrils daily as needed for allergies or rhinitis.  . multivitamin (RENA-VIT) TABS tablet Take 1 tablet by mouth daily.  . sertraline (ZOLOFT) 50 MG tablet TAKE 1 TABLET(50 MG) BY MOUTH DAILY   No current facility-administered medications for this visit. (Other)      REVIEW OF SYSTEMS:    ALLERGIES Allergies  Allergen Reactions  . Food     Melons-itchy/headaches    PAST MEDICAL HISTORY Past Medical History:  Diagnosis Date  . Anemia   . Anxiety    situational  . Diabetes mellitus without complication ([place])   . Diabetic retinopathy ([place])    PDR OU  . ESRD on dialysis [facility])    Dialyzing T-Th-S  . History of blood transfusion   . Hypertension   . PONV (postoperative nausea and vomiting)    03/22/2019-  only 15 years ago  . Renal disorder   . Stroke [facility]) 2019   vision   Past Surgical History:  Procedure Laterality Date  . A/V FISTULAGRAM N/A 03/11/2019   Procedure: A/V FISTULAGRAM - Left Arm;  Surgeon: Waynetta Sandy, MD;  Location: [place] [facility];  Service: Cardiovascular;  Laterality: N/A;  . arm surgery Right    Fracture  . AV FISTULA PLACEMENT Left 01/25/2019   Procedure: ARTERIOVENOUS (AV) FISTULA CREATION  LEFT UPPER  ARM;  Surgeon: Serafina Mitchell, MD;  Location: [place];  Service: Vascular;  Laterality: Left;  . BASCILIC VEIN TRANSPOSITION Left 03/27/2019   Procedure: SECOND STAGE BASILIC VEIN TRANSPOSITION LEFT ARM;  Surgeon: Serafina Mitchell, MD;  Location: [place];  Service: Vascular;  Laterality: Left;  . WISDOM TOOTH EXTRACTION      FAMILY HISTORY Family History  Problem Relation Age of Onset  . Diabetes Mellitus II Mother   . Hypertension Mother   . Cataracts Mother   . Diabetes Mellitus II Father   . Stroke Father   . Hypertension Father   . Glaucoma Maternal Grandmother   . Amblyopia Neg Hx   . Blindness Neg Hx   . Macular degeneration Neg Hx   . Retinal detachment Neg Hx   . Strabismus Neg Hx   . Retinitis pigmentosa Neg Hx     SOCIAL  HISTORY Social History   Tobacco Use  . Smoking status: Never Smoker  . Smokeless tobacco: Never Used  Vaping Use  . Vaping Use: Never used  Substance Use Topics  . Alcohol use: Not Currently  . Drug use: Not Currently         OPHTHALMIC EXAM:  Not recorded     IMAGING AND PROCEDURES  Imaging and Procedures for '@TODAY'$ @           ASSESSMENT/PLAN:   ICD-10-CM   1. Proliferative diabetic retinopathy of both eyes with macular edema associated with type 2 diabetes mellitus ([place])  [id number]   2. Vitreous hemorrhage of both eyes ([place])  H43.13   3. Retinal edema  H35.81   4. Essential hypertension  I10   5. Hypertensive retinopathy of both eyes  H35.033   6. Acute CVA (cerebrovascular  accident) ([place])  I63.9   7. Ocular hypertension, bilateral  H40.053   8. Combined forms of age-related cataract of right eye  H25.811   9. Pseudophakia  Z96.1    1-3. Proliferative diabetic retinopathy OU  - s/p PRP OD (10.02.19), (01.22.20), (02.12.20)   - s/p PRP OS (05/02/2018), (02.19.20), (2.17.22)  - s/p IVK #1 OD (01.22.20), OS (02.12.20)  - s/p IVA OD #1 (10.21.20), #2 (12.02.20), #3 (01.13.21), #4 (02.10.21), #5 (04.19.21), #6 (05.21.21), #7 (06.21.21), #8 (08.19.21), #9 (10.11.21), #10 (12.01.21), #11 (01.25.22)  - s/p IVA OS #1 (12.02.20), #2 (04.19.21), #3 (05.21.21), #4 (06.21.21), #5 (08.19.21), #6 (10.11.21), #7 (12.01.21), #8 (01.25.22)  - FA 8.19.19 shows leaking MA, patches of capillary nonperfusion OU  - repeat FA 10.11.21 shows persistent NV OU, large patches of vascular nonperfusion OU (OS>OD) -- would benefit from some fill in PRP OD (OS done 2.17.22)  - BCVA OD: 20/50, OS: 20/40 (both improved)  - exam shows interval decrease in VH, preretinal and subhyaloid hemorrhage, persistent preretinal fibrosis OU  - OCT shows OD: mild interval improvement in vitreous traction -- scattered points of release of VMT; OS: Mild interval improvement in vitreous opacities, persistent retinal atrophy  - VH precautions reviewed -- minimize activities, keep head elevated, avoid ASA/NSAIDs/blood thinners as able  - pt on heparin  - discussed possibility of surgery being needed in the future to stabilize eye and prevent further progression of diabetic eye disease  - recommend IVA OU (OD #12 and OS #9) today, 03.01.22  - pt wishes to proceed  - RBA of procedure discussed, questions answered   - see procedure note  - [place] informed consent form signed and scanned on 01.13.2021  - f/u Thursday at 9:30 -- DFE/OCT, PRP OD  4,5. 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 [name] in mid August 2019 with a 2 wk history intractable headaches and decreased vision  OU  - found to have BP of 170s/90s -- sent to ED and was [facility] to [facility] where SBP was up to 200  - discussed importance of tight BP control   - pt reports improved compliance with BP medications and improvement in BP  6. 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 Limited 2 sequence MRI head: Acute RIGHT periatrial subcentimeter  nonhemorrhagic infarct, in region of optic tract.  - could be limiting vision -- will check formal visual field once macular edema improved  7. Ocular Hypertention OU  - today, IOP OD 16, OS 13  - patient reports not taking cosopt bid OU for  about a month--IOP OK OU  8. Mixed cataract OD - The symptoms of cataract, surgical options, and treatments and risks were discussed with patient. - discussed diagnosis and progression - under the expert management of Dr. Kathlen Mody - clear from a retina standpoint to proceed with cataract surgery when patient and surgeon are ready  9. Pseudophakia OS  - s/p CE/IOL (07.29.21, Dr. Kathlen Mody)  - IOL in good position, doing well - monitor   Ophthalmic Meds Ordered this visit:  No orders of the defined types were placed in this encounter.      No follow-ups on file.  There are no Patient Instructions on file for this visit.   This document serves as a record of services personally performed by Gardiner Sleeper, MD, PhD. It was created on their behalf by Leonie Douglas, an ophthalmic technician. The creation of this record is the provider's dictation and/or activities during the visit.    Electronically signed by: Leonie Douglas COA, 08/11/20  3:12 PM   Gardiner Sleeper, M.D., Ph.D. Diseases & Surgery of the Retina and [place] 08/13/2020   Abbreviations: M myopia (nearsighted); A astigmatism; H hyperopia (farsighted); P presbyopia; Mrx spectacle prescription;  CTL contact lenses; OD right eye; OS left eye; OU both eyes  XT exotropia; ET  esotropia; PEK punctate epithelial keratitis; PEE punctate epithelial erosions; DES dry eye syndrome; MGD meibomian gland dysfunction; ATs artificial tears; PFAT's preservative free artificial tears; [place] nuclear sclerotic cataract; PSC posterior subcapsular cataract; ERM epi-retinal membrane; PVD posterior vitreous detachment; RD retinal detachment; DM diabetes mellitus; DR diabetic retinopathy; NPDR non-proliferative diabetic retinopathy; PDR proliferative diabetic retinopathy; CSME clinically significant macular edema; DME diabetic macular edema; dbh dot blot hemorrhages; CWS cotton wool spot; POAG primary open angle glaucoma; C/D cup-to-disc ratio; HVF humphrey visual field; GVF goldmann visual field; OCT optical coherence tomography; IOP intraocular pressure; BRVO Branch retinal vein occlusion; CRVO central retinal vein occlusion; CRAO central retinal artery occlusion; BRAO branch retinal artery occlusion; RT retinal tear; SB scleral buckle; PPV pars plana vitrectomy; VH Vitreous hemorrhage; PRP panretinal laser photocoagulation; IVK intravitreal kenalog; VMT vitreomacular traction; MH Macular hole;  NVD neovascularization of the disc; NVE neovascularization elsewhere; AREDS age related eye disease study; ARMD age related macular degeneration; POAG primary open angle glaucoma; EBMD epithelial/anterior basement membrane dystrophy; ACIOL anterior chamber intraocular lens; IOL intraocular lens; PCIOL posterior chamber intraocular lens; Phaco/IOL phacoemulsification with intraocular lens placement; [place] photorefractive keratectomy; LASIK laser assisted in situ keratomileusis; HTN hypertension; DM diabetes mellitus; COPD chronic obstructive pulmonary disease

## 2020-08-13 ENCOUNTER — Encounter (INDEPENDENT_AMBULATORY_CARE_PROVIDER_SITE_OTHER): Payer: Self-pay | Admitting: Ophthalmology

## 2020-08-13 DIAGNOSIS — H35033 Hypertensive retinopathy, bilateral: Secondary | ICD-10-CM

## 2020-08-13 DIAGNOSIS — E113513 Type 2 diabetes mellitus with proliferative diabetic retinopathy with macular edema, bilateral: Secondary | ICD-10-CM

## 2020-08-13 DIAGNOSIS — H25811 Combined forms of age-related cataract, right eye: Secondary | ICD-10-CM

## 2020-08-13 DIAGNOSIS — I10 Essential (primary) hypertension: Secondary | ICD-10-CM

## 2020-08-13 DIAGNOSIS — H4313 Vitreous hemorrhage, bilateral: Secondary | ICD-10-CM

## 2020-08-13 DIAGNOSIS — I639 Cerebral infarction, unspecified: Secondary | ICD-10-CM

## 2020-08-13 DIAGNOSIS — H3581 Retinal edema: Secondary | ICD-10-CM

## 2020-08-13 DIAGNOSIS — H40053 Ocular hypertension, bilateral: Secondary | ICD-10-CM

## 2020-08-13 DIAGNOSIS — Z961 Presence of intraocular lens: Secondary | ICD-10-CM

## 2020-08-17 NOTE — Progress Notes (Signed)
[facility] Note  08/18/2020     CHIEF COMPLAINT Patient presents for Retina Follow Up   HISTORY OF PRESENT ILLNESS: Bill [name] is a 40 y.o. male who presents to the clinic today for:   HPI    Retina Follow Up    Patient presents with  Diabetic Retinopathy.  In both eyes.  This started 1 week ago.  I, the attending physician,  performed the HPI with the patient and updated documentation appropriately.          Comments    Patient here for 1 week retina follow up for PDR OU PRP OD. Patient states vision about the same. No eye pain.       Last edited by [name], MD on 08/19/2020 12:22 AM. (History)    pt here for PRP OD today  Referring physician: Arlyss Queen, MD [place],  [place] [id number]  HISTORICAL INFORMATION:   Selected notes from the MEDICAL RECORD NUMBER Referred by Dr. [name] for concern of severe NPDR / PDR OU   CURRENT MEDICATIONS: No current outpatient medications on file. (Ophthalmic Drugs)   No current facility-administered medications for this visit. (Ophthalmic Drugs)   Current Outpatient Medications (Other)  Medication Sig  . aspirin 81 MG EC tablet Take 81 mg by mouth as needed.  Marland Kitchen atorvastatin (LIPITOR) 10 MG tablet Take 1 tablet (10 mg total) by mouth daily.  [name] 1 GM 210 MG(Fe) tablet Take 210 mg by mouth 3 (three) times daily with meals.  . Dulaglutide (TRULICITY) [id number] 0000000 SOPN Inject 0.75 mg into the skin once a week.  . ferric citrate (AURYXIA) 1 GM 210 MG(Fe) tablet Take 210 mg by mouth as directed.  . fluticasone (FLONASE) 50 MCG/ACT nasal spray Place 1 spray into both nostrils daily as needed for allergies or rhinitis.  . multivitamin (RENA-VIT) TABS tablet Take 1 tablet by mouth daily.  . sertraline (ZOLOFT) 50 MG tablet TAKE 1 TABLET(50 MG) BY MOUTH DAILY   No current facility-administered medications for this visit. (Other)      REVIEW OF SYSTEMS: ROS     Positive for: Neurological, Genitourinary, Endocrine, Cardiovascular, Eyes, Psychiatric   Negative for: Constitutional, Gastrointestinal, Skin, Musculoskeletal, HENT, Respiratory, Allergic/Imm, Heme/Lymph   Last edited by [name], COA on 08/18/2020  1:17 PM. (History)       ALLERGIES Allergies  Allergen Reactions  . Food     Melons-itchy/headaches    PAST MEDICAL HISTORY Past Medical History:  Diagnosis Date  . Anemia   . Anxiety    situational  . Diabetes mellitus without complication ([place])   . Diabetic retinopathy ([place])    PDR OU  . ESRD on dialysis [facility])    Dialyzing T-Th-S  . History of blood transfusion   . Hypertension   . PONV (postoperative nausea and vomiting)    03/22/2019- only 15 years ago  . Renal disorder   . Stroke [facility]) 2019   vision   Past Surgical History:  Procedure Laterality Date  . A/V FISTULAGRAM N/A 03/11/2019   Procedure: A/V FISTULAGRAM - Left Arm;  Surgeon: Waynetta Sandy, MD;  Location: [place] [facility];  Service: Cardiovascular;  Laterality: N/A;  . arm surgery Right    Fracture  . AV FISTULA PLACEMENT Left 01/25/2019   Procedure: ARTERIOVENOUS (AV) FISTULA CREATION  LEFT UPPER  ARM;  Surgeon: Serafina Mitchell, MD;  Location: [place];  Service: Vascular;  Laterality: Left;  . BASCILIC VEIN TRANSPOSITION Left 03/27/2019   Procedure: SECOND STAGE BASILIC VEIN TRANSPOSITION LEFT ARM;  Surgeon: Serafina Mitchell, MD;  Location: [place];  Service: Vascular;  Laterality: Left;  . WISDOM TOOTH EXTRACTION      FAMILY HISTORY Family History  Problem Relation Age of Onset  . Diabetes Mellitus II Mother   . Hypertension Mother   . Cataracts Mother   . Diabetes Mellitus II Father   . Stroke Father   . Hypertension Father   . Glaucoma Maternal Grandmother   . Amblyopia Neg Hx   . Blindness Neg Hx   . Macular degeneration Neg Hx   . Retinal detachment Neg Hx   . Strabismus Neg Hx   . Retinitis pigmentosa Neg Hx     SOCIAL  HISTORY Social History   Tobacco Use  . Smoking status: Never Smoker  . Smokeless tobacco: Never Used  Vaping Use  . Vaping Use: Never used  Substance Use Topics  . Alcohol use: Not Currently  . Drug use: Not Currently         OPHTHALMIC EXAM:  Base Eye Exam    Visual Acuity (Snellen - Linear)      Right Left   Dist [place] 20/50 -1 20/60 +2   Dist ph [place] NI 20/50 +2       Tonometry (Tonopen, 1:14 PM)      Right Left   Pressure 16 14       Pupils      Dark Light Shape React APD   Right 4 3 Round Brisk None   Left 4 3 Round Brisk None       Visual Fields (Counting fingers)      Left Right    Full Full       Extraocular Movement      Right Left    Full Full       Neuro/Psych    Oriented x3: Yes   Mood/Affect: Normal       Dilation    Both eyes: 1.0% Mydriacyl, 2.5% Phenylephrine @ 1:14 PM        Slit Lamp and Fundus Exam    Slit Lamp Exam      Right Left   Lids/Lashes Normal Normal   Conjunctiva/Sclera White and quiet White and quiet   Cornea Trace Punctate epithelial erosions Trace Punctate epithelial erosions   Anterior Chamber Deep and quiet Deep and quiet   Iris Round and dilated, No NVI Round and dilated, no NVI   Lens 1+ Nuclear sclerosis, 1-2+ Cortical cataract, 2+Posterior subcapsular cataract PC IOL in good position   Vitreous VH improved, scattered fibrosis superior to disc, white blood stained vitreous condensations -- slightly improving, old, white blood clots settled inferiorly Syneresis, white blood stained vitreous condensations settling inferiorly and improving       Fundus Exam      Right Left   Disc 2-3+Pallor, sharp rim, fibrosis 2-3+Pallor, Sharp rim, Compact, +fibrosis/regressing NVD, no heme   C/D Ratio 0.1 0.2   Macula Blunted foveal reflex, +fibrosis, +exudates/IRH, +MA, macular pseudo hole like appearance Flat, blunted foveal reflex, atrophic, flat choroidal nevus superior macula   Vessels Severe attenuation, Tortuous,  +fibrosis, +NV inferior to disc -- regressing Sclerotic arterioles, Tortuous, +NV, fibrosis along superior arcades, severe Vascular attenuation   Periphery Attached, good 360 PRP, room for posterior fill in if needed, tractional fibrosis superior and along arcades, greatest above disc Attached; 360 PRP in place -- room  for posterior fill in, scattered tractional fibrosis          IMAGING AND PROCEDURES  Imaging and Procedures for '@TODAY'$ @  OCT, Retina - OU - Both Eyes       Right Eye Quality was good. Central Foveal Thickness: 326. Progression has been stable. Findings include abnormal foveal contour, intraretinal hyper-reflective material, preretinal fibrosis, vitreous traction, epiretinal membrane, no SRF, macular pucker, inner retinal atrophy, intraretinal fluid (mild interval improvement in vitreous traction -- scattered points of release of VMT).   Left Eye Quality was good. Central Foveal Thickness: 224. Progression has been stable. Findings include abnormal foveal contour, intraretinal hyper-reflective material, no SRF, inner retinal atrophy, epiretinal membrane, preretinal fibrosis, outer retinal atrophy, vitreomacular adhesion , no IRF, vitreous traction (Mild interval improvement in vitreous opacities, persistent retinal atrophy).   Notes *Images captured and stored on drive  Diagnosis / Impression:  PDR w/ tractional fibrosis OU OD: mild interval improvement in vitreous traction -- scattered points of release of VMT OS: Mild interval improvement in vitreous opacities, persistent retinal atrophy  Clinical management:  See below  Abbreviations: NFP - Normal foveal profile. CME - cystoid macular edema. PED - pigment epithelial detachment. IRF - intraretinal fluid. SRF - subretinal fluid. EZ - ellipsoid zone. ERM - epiretinal membrane. ORA - outer retinal atrophy. ORT - outer retinal tubulation. SRHM - subretinal hyper-reflective material         Panretinal Photocoagulation  - OD - Right Eye       LASER PROCEDURE NOTE  Diagnosis:   Proliferative Diabetic Retinopathy, RIGHT EYE  Procedure:  Pan-retinal photocoagulation using slit lamp laser, RIGHT EYE, fill-in  Anesthesia:  Topical  Surgeon: [name], MD, PhD   Informed consent obtained, operative eye marked, and time out performed prior to initiation of laser.   [place] [id number] slit lamp laser Pattern: 2x2 square Power: 250 mW Duration: 30 msec  Spot size: 200 microns  # spots: 403 spots -- posterior fill-in  Complications: None  RTC: 3 wks  Patient tolerated the procedure well and received written and verbal post-procedure care information/education.                  ASSESSMENT/PLAN:   ICD-10-CM   1. Proliferative diabetic retinopathy of both eyes with macular edema associated with type 2 diabetes mellitus ([place])  [id number] Panretinal Photocoagulation - OD - Right Eye  2. Vitreous hemorrhage of both eyes ([place])  H43.13   3. Retinal edema  H35.81 OCT, Retina - OU - Both Eyes  4. Essential hypertension  I10   5. Hypertensive retinopathy of both eyes  H35.033   6. Acute CVA (cerebrovascular accident) ([place])  I63.9   7. Ocular hypertension, bilateral  H40.053   8. Combined forms of age-related cataract of right eye  H25.811   9. Pseudophakia  Z96.1    1-3. Proliferative diabetic retinopathy OU  - s/p PRP OD (10.02.19), (01.22.20), (02.12.20)   - s/p PRP OS (05/02/2018), (02.19.20), (2.17.22)  - s/p IVK #1 OD (01.22.20), OS (02.12.20)  - s/p IVA OD #1 (10.21.20), #2 (12.02.20), #3 (01.13.21), #4 (02.10.21), #5 (04.19.21), #6 (05.21.21), #7 (06.21.21), #8 (08.19.21), #9 (10.11.21), #10 (12.01.21), #11 (01.25.22), #12 (03.01.22)  - s/p IVA OS #1 (12.02.20), #2 (04.19.21), #3 (05.21.21), #4 (06.21.21), #5 (08.19.21), #6 (10.11.21), #7 (12.01.21), #8 (01.25.22), #9 (03.01.22)  - FA 8.19.19 shows leaking MA, patches of capillary nonperfusion OU  - repeat FA 10.11.21 shows persistent NV  OU, large patches of vascular nonperfusion OU (OS>OD) --  would benefit from some fill in PRP OD (OS done 2.17.22)  - BCVA OD: 20/50, OS: 20/50 (both improved)  - exam shows interval decrease in VH, preretinal and subhyaloid hemorrhage, persistent preretinal fibrosis OU  - OCT shows OD: mild interval improvement in vitreous traction -- scattered points of release of VMT; OS: Mild interval improvement in vitreous opacities, persistent retinal atrophy  - VH precautions reviewed -- minimize activities, keep head elevated, avoid ASA/NSAIDs/blood thinners as able  - pt on heparin  - discussed possibility of surgery being needed in the future to stabilize eye and prevent further progression of diabetic eye disease  - recommend PRP OD today, 03.08.22  - pt wishes to proceed  - RBA of procedure discussed, questions answered  - see procedure note  - start Lotemax SM QID OD x5 days  - Avastin informed consent form signed and scanned on 01.13.2021  - f/u 3 wks  4,5. 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 [name] in mid August 2019 with a 2 wk history intractable headaches and decreased vision OU  - found to have BP of 170s/90s -- sent to ED and was [facility] to [facility] where SBP was up to 200  - discussed importance of tight BP control   - pt reports improved compliance with BP medications and improvement in BP  6. 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 Limited 2 sequence MRI head: Acute RIGHT periatrial subcentimeter  nonhemorrhagic infarct, in region of optic tract.  - could be limiting vision -- will check formal visual field once macular edema improves  7. Ocular Hypertention OU  - today, IOP OD 16, OS 13  - patient reports not taking cosopt bid OU for about a month--IOP OK OU  8. Mixed cataract OD - The symptoms of cataract, surgical options, and treatments and risks were discussed with patient. - discussed  diagnosis and progression - under the expert management of Dr. Kathlen Mody - clear from a retina standpoint to proceed with cataract surgery when patient and surgeon are ready  9. Pseudophakia OS  - s/p CE/IOL (07.29.21, Dr. Kathlen Mody)  - IOL in good position, doing well - monitor   Ophthalmic Meds Ordered this visit:  No orders of the defined types were placed in this encounter.      Return in about 3 weeks (around 09/08/2020) for PDR w/ DME - Dilated Exam, OCT, Possible Injxn.  There are no Patient Instructions on file for this visit.   This document serves as a record of services personally performed by Gardiner Sleeper, MD, PhD. It was created on their behalf by Estill Bakes, COT an ophthalmic technician. The creation of this record is the provider's dictation and/or activities during the visit.    Electronically signed by: Estill Bakes, COT 3.7.22 @ 12:24 AM   This document serves as a record of services personally performed by Gardiner Sleeper, MD, PhD. It was created on their behalf by San Jetty. Owens Shark, OA an ophthalmic technician. The creation of this record is the provider's dictation and/or activities during the visit.    Electronically signed by: San Jetty. Marguerita Merles 03.08.2022 12:24 AM  Gardiner Sleeper, M.D., Ph.D. Diseases & Surgery of the Retina and [place] 08/18/2020   I have reviewed the above documentation for accuracy and completeness, and I agree with the above. Gardiner Sleeper, M.D., Ph.D. 08/19/20 12:24  AM   Abbreviations: M myopia (nearsighted); A astigmatism; H hyperopia (farsighted); P presbyopia; Mrx spectacle prescription;  CTL contact lenses; OD right eye; OS left eye; OU both eyes  XT exotropia; ET esotropia; PEK punctate epithelial keratitis; PEE punctate epithelial erosions; DES dry eye syndrome; MGD meibomian gland dysfunction; ATs artificial tears; PFAT's preservative free artificial tears; [place] nuclear sclerotic cataract;  PSC posterior subcapsular cataract; ERM epi-retinal membrane; PVD posterior vitreous detachment; RD retinal detachment; DM diabetes mellitus; DR diabetic retinopathy; NPDR non-proliferative diabetic retinopathy; PDR proliferative diabetic retinopathy; CSME clinically significant macular edema; DME diabetic macular edema; dbh dot blot hemorrhages; CWS cotton wool spot; POAG primary open angle glaucoma; C/D cup-to-disc ratio; HVF humphrey visual field; GVF goldmann visual field; OCT optical coherence tomography; IOP intraocular pressure; BRVO Branch retinal vein occlusion; CRVO central retinal vein occlusion; CRAO central retinal artery occlusion; BRAO branch retinal artery occlusion; RT retinal tear; SB scleral buckle; PPV pars plana vitrectomy; VH Vitreous hemorrhage; PRP panretinal laser photocoagulation; IVK intravitreal kenalog; VMT vitreomacular traction; MH Macular hole;  NVD neovascularization of the disc; NVE neovascularization elsewhere; AREDS age related eye disease study; ARMD age related macular degeneration; POAG primary open angle glaucoma; EBMD epithelial/anterior basement membrane dystrophy; ACIOL anterior chamber intraocular lens; IOL intraocular lens; PCIOL posterior chamber intraocular lens; Phaco/IOL phacoemulsification with intraocular lens placement; [place] photorefractive keratectomy; LASIK laser assisted in situ keratomileusis; HTN hypertension; DM diabetes mellitus; COPD chronic obstructive pulmonary disease

## 2020-08-18 ENCOUNTER — Encounter (INDEPENDENT_AMBULATORY_CARE_PROVIDER_SITE_OTHER): Payer: Self-pay | Admitting: Ophthalmology

## 2020-08-18 ENCOUNTER — Other Ambulatory Visit: Payer: Self-pay

## 2020-08-18 ENCOUNTER — Ambulatory Visit (INDEPENDENT_AMBULATORY_CARE_PROVIDER_SITE_OTHER): Payer: 59 | Admitting: Ophthalmology

## 2020-08-18 DIAGNOSIS — H25811 Combined forms of age-related cataract, right eye: Secondary | ICD-10-CM

## 2020-08-18 DIAGNOSIS — H40053 Ocular hypertension, bilateral: Secondary | ICD-10-CM

## 2020-08-18 DIAGNOSIS — H35033 Hypertensive retinopathy, bilateral: Secondary | ICD-10-CM

## 2020-08-18 DIAGNOSIS — E113513 Type 2 diabetes mellitus with proliferative diabetic retinopathy with macular edema, bilateral: Secondary | ICD-10-CM

## 2020-08-18 DIAGNOSIS — I10 Essential (primary) hypertension: Secondary | ICD-10-CM | POA: Diagnosis not present

## 2020-08-18 DIAGNOSIS — Z961 Presence of intraocular lens: Secondary | ICD-10-CM

## 2020-08-18 DIAGNOSIS — H3581 Retinal edema: Secondary | ICD-10-CM

## 2020-08-18 DIAGNOSIS — H4313 Vitreous hemorrhage, bilateral: Secondary | ICD-10-CM

## 2020-08-18 DIAGNOSIS — I639 Cerebral infarction, unspecified: Secondary | ICD-10-CM

## 2020-09-08 NOTE — Progress Notes (Shared)
[facility] Note  09/10/2020     CHIEF COMPLAINT Patient presents for No chief complaint on file.   HISTORY OF PRESENT ILLNESS: Bill [name] is a 40 y.o. male who presents to the clinic today for:    Referring physician: Isaac Bliss, Rayford Halsted, MD [address],  [place] [address]  HISTORICAL INFORMATION:   Selected notes from the MEDICAL RECORD NUMBER Referred by Dr. [name] for concern of severe NPDR / PDR OU   CURRENT MEDICATIONS: No current outpatient medications on file. (Ophthalmic Drugs)   No current facility-administered medications for this visit. (Ophthalmic Drugs)   Current Outpatient Medications (Other)  Medication Sig  . aspirin 81 MG EC tablet Take 81 mg by mouth as needed.  Marland Kitchen atorvastatin (LIPITOR) 10 MG tablet Take 1 tablet (10 mg total) by mouth daily.  [name] 1 GM 210 MG(Fe) tablet Take 210 mg by mouth 3 (three) times daily with meals.  . Dulaglutide (TRULICITY) [id number] 0000000 SOPN Inject 0.75 mg into the skin once a week.  . ferric citrate (AURYXIA) 1 GM 210 MG(Fe) tablet Take 210 mg by mouth as directed.  . fluticasone (FLONASE) 50 MCG/ACT nasal spray Place 1 spray into both nostrils daily as needed for allergies or rhinitis.  . multivitamin (RENA-VIT) TABS tablet Take 1 tablet by mouth daily.  . sertraline (ZOLOFT) 50 MG tablet TAKE 1 TABLET(50 MG) BY MOUTH DAILY   No current facility-administered medications for this visit. (Other)      REVIEW OF SYSTEMS:    ALLERGIES Allergies  Allergen Reactions  . Food     Melons-itchy/headaches    PAST MEDICAL HISTORY Past Medical History:  Diagnosis Date  . Anemia   . Anxiety    situational  . Diabetes mellitus without complication ([place])   . Diabetic retinopathy ([place])    PDR OU  . ESRD on dialysis [facility])    Dialyzing T-Th-S  . History of blood transfusion   . Hypertension   . PONV (postoperative nausea and vomiting)    03/22/2019- only  15 years ago  . Renal disorder   . Stroke [facility]) 2019   vision   Past Surgical History:  Procedure Laterality Date  . A/V FISTULAGRAM N/A 03/11/2019   Procedure: A/V FISTULAGRAM - Left Arm;  Surgeon: Waynetta Sandy, MD;  Location: [place] [facility];  Service: Cardiovascular;  Laterality: N/A;  . arm surgery Right    Fracture  . AV FISTULA PLACEMENT Left 01/25/2019   Procedure: ARTERIOVENOUS (AV) FISTULA CREATION  LEFT UPPER  ARM;  Surgeon: Serafina Mitchell, MD;  Location: [place];  Service: Vascular;  Laterality: Left;  . BASCILIC VEIN TRANSPOSITION Left 03/27/2019   Procedure: SECOND STAGE BASILIC VEIN TRANSPOSITION LEFT ARM;  Surgeon: Serafina Mitchell, MD;  Location: [place];  Service: Vascular;  Laterality: Left;  . WISDOM TOOTH EXTRACTION      FAMILY HISTORY Family History  Problem Relation Age of Onset  . Diabetes Mellitus II Mother   . Hypertension Mother   . Cataracts Mother   . Diabetes Mellitus II Father   . Stroke Father   . Hypertension Father   . Glaucoma Maternal Grandmother   . Amblyopia Neg Hx   . Blindness Neg Hx   . Macular degeneration Neg Hx   . Retinal detachment Neg Hx   . Strabismus Neg Hx   . Retinitis pigmentosa Neg Hx     SOCIAL HISTORY Social  History   Tobacco Use  . Smoking status: Never Smoker  . Smokeless tobacco: Never Used  Vaping Use  . Vaping Use: Never used  Substance Use Topics  . Alcohol use: Not Currently  . Drug use: Not Currently         OPHTHALMIC EXAM:  Not recorded     IMAGING AND PROCEDURES  Imaging and Procedures for '@TODAY'$ @           ASSESSMENT/PLAN: No diagnosis found. 1-3. Proliferative diabetic retinopathy OU  - s/p PRP OD (10.02.19), (01.22.20), (02.12.20)   - s/p PRP OS (05/02/2018), (02.19.20), (2.17.22)  - s/p IVK #1 OD (01.22.20), OS (02.12.20)  - s/p IVA OD #1 (10.21.20), #2 (12.02.20), #3 (01.13.21), #4 (02.10.21), #5 (04.19.21), #6 (05.21.21), #7 (06.21.21), #8 (08.19.21), #9  (10.11.21), #10 (12.01.21), #11 (01.25.22), #12 (03.01.22)  - s/p IVA OS #1 (12.02.20), #2 (04.19.21), #3 (05.21.21), #4 (06.21.21), #5 (08.19.21), #6 (10.11.21), #7 (12.01.21), #8 (01.25.22), #9 (03.01.22)  - FA 8.19.19 shows leaking MA, patches of capillary nonperfusion OU  - repeat FA 10.11.21 shows persistent NV OU, large patches of vascular nonperfusion OU (OS>OD) -- would benefit from some fill in PRP OD (OS done 2.17.22)  - BCVA OD: 20/50, OS: 20/50 (both improved)  - exam shows interval decrease in VH, preretinal and subhyaloid hemorrhage, persistent preretinal fibrosis OU  - OCT shows OD: mild interval improvement in vitreous traction -- scattered points of release of VMT; OS: Mild interval improvement in vitreous opacities, persistent retinal atrophy  - VH precautions reviewed -- minimize activities, keep head elevated, avoid ASA/NSAIDs/blood thinners as able  - pt on heparin  - discussed possibility of surgery being needed in the future to stabilize eye and prevent further progression of diabetic eye disease  - PRP OD 03.08.22  - Avastin informed consent form signed and scanned on 01.13.2021  - f/u 3 wks  4,5. 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 [name] in mid August 2019 with a 2 wk history intractable headaches and decreased vision OU  - found to have BP of 170s/90s -- sent to ED and was [facility] to [facility] where SBP was up to 200  - discussed importance of tight BP control   - pt reports improved compliance with BP medications and improvement in BP  6. 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 Limited 2 sequence MRI head: Acute RIGHT periatrial subcentimeter  nonhemorrhagic infarct, in region of optic tract.  - could be limiting vision -- will check formal visual field once macular edema improves  7. Ocular Hypertention OU  - today, IOP OD 16, OS 13  - patient reports not taking cosopt bid OU  for about a month--IOP OK OU  8. Mixed cataract OD - The symptoms of cataract, surgical options, and treatments and risks were discussed with patient. - discussed diagnosis and progression - under the expert management of Dr. Kathlen Mody - clear from a retina standpoint to proceed with cataract surgery when patient and surgeon are ready  9. Pseudophakia OS  - s/p CE/IOL (07.29.21, Dr. Kathlen Mody)  - IOL in good position, doing well - monitor   Ophthalmic Meds Ordered this visit:  No orders of the defined types were placed in this encounter.      No follow-ups on file.  There are no Patient Instructions on file for this visit.   This document serves as a record of services personally  performed by Gardiner Sleeper, MD, PhD. It was created on their behalf by Estill Bakes, [place] an ophthalmic technician. The creation of this record is the provider's dictation and/or activities during the visit.    Electronically signed by: Estill Bakes, COT 3.29.22 @ 10:32 AM  Abbreviations: M myopia (nearsighted); A astigmatism; H hyperopia (farsighted); P presbyopia; Mrx spectacle prescription;  CTL contact lenses; OD right eye; OS left eye; OU both eyes  XT exotropia; ET esotropia; PEK punctate epithelial keratitis; PEE punctate epithelial erosions; DES dry eye syndrome; MGD meibomian gland dysfunction; ATs artificial tears; PFAT's preservative free artificial tears; [place] nuclear sclerotic cataract; PSC posterior subcapsular cataract; ERM epi-retinal membrane; PVD posterior vitreous detachment; RD retinal detachment; DM diabetes mellitus; DR diabetic retinopathy; NPDR non-proliferative diabetic retinopathy; PDR proliferative diabetic retinopathy; CSME clinically significant macular edema; DME diabetic macular edema; dbh dot blot hemorrhages; CWS cotton wool spot; POAG primary open angle glaucoma; C/D cup-to-disc ratio; HVF humphrey visual field; GVF goldmann visual field; OCT optical coherence tomography; IOP  intraocular pressure; BRVO Branch retinal vein occlusion; CRVO central retinal vein occlusion; CRAO central retinal artery occlusion; BRAO branch retinal artery occlusion; RT retinal tear; SB scleral buckle; PPV pars plana vitrectomy; VH Vitreous hemorrhage; PRP panretinal laser photocoagulation; IVK intravitreal kenalog; VMT vitreomacular traction; MH Macular hole;  NVD neovascularization of the disc; NVE neovascularization elsewhere; AREDS age related eye disease study; ARMD age related macular degeneration; POAG primary open angle glaucoma; EBMD epithelial/anterior basement membrane dystrophy; ACIOL anterior chamber intraocular lens; IOL intraocular lens; PCIOL posterior chamber intraocular lens; Phaco/IOL phacoemulsification with intraocular lens placement; [place] photorefractive keratectomy; LASIK laser assisted in situ keratomileusis; HTN hypertension; DM diabetes mellitus; COPD chronic obstructive pulmonary disease

## 2020-09-10 ENCOUNTER — Encounter (INDEPENDENT_AMBULATORY_CARE_PROVIDER_SITE_OTHER): Payer: 59 | Admitting: Ophthalmology

## 2020-09-10 DIAGNOSIS — E113513 Type 2 diabetes mellitus with proliferative diabetic retinopathy with macular edema, bilateral: Secondary | ICD-10-CM

## 2020-09-10 DIAGNOSIS — H40053 Ocular hypertension, bilateral: Secondary | ICD-10-CM

## 2020-09-10 DIAGNOSIS — H3581 Retinal edema: Secondary | ICD-10-CM

## 2020-09-10 DIAGNOSIS — H35033 Hypertensive retinopathy, bilateral: Secondary | ICD-10-CM

## 2020-09-10 DIAGNOSIS — Z961 Presence of intraocular lens: Secondary | ICD-10-CM

## 2020-09-10 DIAGNOSIS — I639 Cerebral infarction, unspecified: Secondary | ICD-10-CM

## 2020-09-10 DIAGNOSIS — I10 Essential (primary) hypertension: Secondary | ICD-10-CM

## 2020-09-10 DIAGNOSIS — H4313 Vitreous hemorrhage, bilateral: Secondary | ICD-10-CM

## 2020-09-10 DIAGNOSIS — H25811 Combined forms of age-related cataract, right eye: Secondary | ICD-10-CM

## 2020-09-11 NOTE — Progress Notes (Signed)
[facility] Note  09/15/2020     CHIEF COMPLAINT Patient presents for Retina Follow Up   HISTORY OF PRESENT ILLNESS: Bill [name] is a 40 y.o. male who presents to the clinic today for:   HPI    Retina Follow Up    Patient presents with  Diabetic Retinopathy.  In both eyes.  This started weeks ago.  Severity is moderate.  Duration of weeks.  Since onset it is stable.  I, the attending physician,  performed the HPI with the patient and updated documentation appropriately.          Comments    Pt states his vision is the same OU.  Pt denies eye pain or discomfort and denies any new or worsening floaters or fol OU.       Last edited by [name], MD on 09/15/2020  9:04 AM. (History)    pt states vision is about the same, pt had a kidney transplant scheduled for next week, but something happened with the donor and it got cancelled  Referring physician: Arlyss Queen, MD [place],  [place] [address]  HISTORICAL INFORMATION:   Selected notes from the MEDICAL RECORD NUMBER Referred by Dr. [name] for concern of severe NPDR / PDR OU   CURRENT MEDICATIONS: No current outpatient medications on file. (Ophthalmic Drugs)   No current facility-administered medications for this visit. (Ophthalmic Drugs)   Current Outpatient Medications (Other)  Medication Sig  . aspirin 81 MG EC tablet Take 81 mg by mouth as needed.  Marland Kitchen atorvastatin (LIPITOR) 10 MG tablet Take 1 tablet (10 mg total) by mouth daily.  [name] 1 GM 210 MG(Fe) tablet Take 210 mg by mouth 3 (three) times daily with meals.  . Dulaglutide (TRULICITY) [id number] [id number] SOPN Inject 0.75 mg into the skin once a week.  . ferric citrate (AURYXIA) 1 GM 210 MG(Fe) tablet Take 210 mg by mouth as directed.  . fluticasone (FLONASE) 50 MCG/ACT nasal spray Place 1 spray into both nostrils daily as needed for allergies or rhinitis.  . multivitamin (RENA-VIT) TABS tablet Take 1  tablet by mouth daily.  . sertraline (ZOLOFT) 50 MG tablet TAKE 1 TABLET(50 MG) BY MOUTH DAILY   No current facility-administered medications for this visit. (Other)      REVIEW OF SYSTEMS: ROS    Positive for: Neurological, Genitourinary, Endocrine, Cardiovascular, Eyes, Psychiatric   Negative for: Constitutional, Gastrointestinal, Skin, Musculoskeletal, HENT, Respiratory, Allergic/Imm, Heme/Lymph   Last edited by Doneen Poisson on 09/15/2020  7:55 AM. (History)       ALLERGIES Allergies  Allergen Reactions  . Food     Melons-itchy/headaches    PAST MEDICAL HISTORY Past Medical History:  Diagnosis Date  . Anemia   . Anxiety    situational  . Diabetes mellitus without complication ([place])   . Diabetic retinopathy ([place])    PDR OU  . ESRD on dialysis [facility])    Dialyzing T-Th-S  . History of blood transfusion   . Hypertension   . PONV (postoperative nausea and vomiting)    03/22/2019- only 15 years ago  . Renal disorder   . Stroke [facility]) 2019   vision   Past Surgical History:  Procedure Laterality Date  . A/V FISTULAGRAM N/A 03/11/2019   Procedure: A/V FISTULAGRAM - Left Arm;  Surgeon: Waynetta Sandy, MD;  Location: [place] [facility];  Service: Cardiovascular;  Laterality: N/A;  . arm surgery Right  Fracture  . AV FISTULA PLACEMENT Left 01/25/2019   Procedure: ARTERIOVENOUS (AV) FISTULA CREATION  LEFT UPPER  ARM;  Surgeon: Serafina Mitchell, MD;  Location: [place];  Service: Vascular;  Laterality: Left;  . BASCILIC VEIN TRANSPOSITION Left 03/27/2019   Procedure: SECOND STAGE BASILIC VEIN TRANSPOSITION LEFT ARM;  Surgeon: Serafina Mitchell, MD;  Location: [place];  Service: Vascular;  Laterality: Left;  . WISDOM TOOTH EXTRACTION      FAMILY HISTORY Family History  Problem Relation Age of Onset  . Diabetes Mellitus II Mother   . Hypertension Mother   . Cataracts Mother   . Diabetes Mellitus II Father   . Stroke Father   . Hypertension Father   . Glaucoma  Maternal Grandmother   . Amblyopia Neg Hx   . Blindness Neg Hx   . Macular degeneration Neg Hx   . Retinal detachment Neg Hx   . Strabismus Neg Hx   . Retinitis pigmentosa Neg Hx     SOCIAL HISTORY Social History   Tobacco Use  . Smoking status: Never Smoker  . Smokeless tobacco: Never Used  Vaping Use  . Vaping Use: Never used  Substance Use Topics  . Alcohol use: Not Currently  . Drug use: Not Currently         OPHTHALMIC EXAM:  Base Eye Exam    Visual Acuity (Snellen - Linear)      Right Left   Dist Edith Endave 20/70 - 20/70 -   Dist ph  20/50 -2 NI       Tonometry (Tonopen, 8:03 AM)      Right Left   Pressure 15 13       Pupils      Dark Light Shape React APD   Right 4 3 Round Brisk 0   Left 4 3 Round Brisk 0       Visual Fields      Left Right    Full Full       Extraocular Movement      Right Left    Full Full       Neuro/Psych    Oriented x3: Yes   Mood/Affect: Normal       Dilation    Both eyes: 1.0% Mydriacyl, 2.5% Phenylephrine @ 8:03 AM        Slit Lamp and Fundus Exam    Slit Lamp Exam      Right Left   Lids/Lashes Normal Normal   Conjunctiva/Sclera White and quiet White and quiet   Cornea Trace Punctate epithelial erosions Trace Punctate epithelial erosions   Anterior Chamber Deep and quiet Deep and quiet   Iris Round and dilated, No NVI Round and dilated, no NVI   Lens 1+ Nuclear sclerosis, 1-2+ Cortical cataract, 2+Posterior subcapsular cataract PC IOL in good position   Vitreous VH improved, scattered fibrosis superior to disc, white blood stained vitreous condensations -- slightly improving, old, white blood clots settled inferiorly Syneresis, white blood stained vitreous condensations settling inferiorly and improving       Fundus Exam      Right Left   Disc 2-3+Pallor, sharp rim, fibrosis 2-3+Pallor, Sharp rim, Compact, +fibrosis/regressing NVD, no heme   C/D Ratio 0.1 0.2   Macula Blunted foveal reflex, +fibrosis,  +exudates/IRH, +MA, ERM with macular pseudo hole like appearance Flat, blunted foveal reflex, atrophic, flat choroidal nevus superior macula   Vessels Severe attenuation, Tortuous, +fibrosis, +NV inferior to disc -- regressing Sclerotic arterioles, Tortuous, +NV, fibrosis along superior  arcades, severe Vascular attenuation   Periphery Attached, 360 PRP w/ good posterior fill in, tractional fibrosis superior and along arcades, greatest above disc Attached; 360 PRP in place, good posterior fill in, scattered tractional fibrosis          IMAGING AND PROCEDURES  Imaging and Procedures for '@TODAY'$ @  OCT, Retina - OU - Both Eyes       Right Eye Quality was good. Central Foveal Thickness: 335. Progression has been stable. Findings include abnormal foveal contour, intraretinal hyper-reflective material, preretinal fibrosis, vitreous traction, epiretinal membrane, no SRF, macular pucker, inner retinal atrophy, no IRF (Persistent tractional edema).   Left Eye Quality was good. Central Foveal Thickness: 224. Progression has been stable. Findings include abnormal foveal contour, intraretinal hyper-reflective material, no SRF, inner retinal atrophy, epiretinal membrane, preretinal fibrosis, outer retinal atrophy, vitreomacular adhesion , no IRF, vitreous traction (Mild interval improvement in vitreous opacities, persistent retinal atrophy, tractional edema nasal to disc).   Notes *Images captured and stored on drive  Diagnosis / Impression:  PDR w/ tractional fibrosis OU OD: Persistent tractional edema OS: Mild interval improvement in vitreous opacities, persistent retinal atrophy, tractional edema nasal to disc  Clinical management:  See below  Abbreviations: NFP - Normal foveal profile. CME - cystoid macular edema. PED - pigment epithelial detachment. IRF - intraretinal fluid. SRF - subretinal fluid. EZ - ellipsoid zone. ERM - epiretinal membrane. ORA - outer retinal atrophy. ORT - outer retinal  tubulation. SRHM - subretinal hyper-reflective material         Intravitreal Injection, Pharmacologic Agent - OD - Right Eye       Time Out 09/15/2020. 8:05 AM. Confirmed correct patient, procedure, site, and patient consented.   Anesthesia Topical anesthesia was used. Anesthetic medications included Lidocaine 2%, Proparacaine 0.5%.   Procedure Preparation included 5% betadine to ocular surface, eyelid speculum. A supplied needle was used.   Injection:  1.25 mg Bevacizumab (AVASTIN) 1.'25mg'$ /0.40m SOLN   NDC: [id number] Lot: [id number]'$ , Expiration date: 10/25/2020   Route: Intravitreal, Site: Right Eye, Waste: 0 mL  Post-op Post injection exam found visual acuity of at least counting fingers. The patient tolerated the procedure well. There were no complications. The patient received written and verbal post procedure care education. Post injection medications were not given.        Intravitreal Injection, Pharmacologic Agent - OS - Left Eye       Time Out 09/15/2020. 8:06 AM. Confirmed correct patient, procedure, site, and patient consented.   Anesthesia Topical anesthesia was used. Anesthetic medications included Lidocaine 2%, Proparacaine 0.5%.   Procedure Preparation included 5% betadine to ocular surface, eyelid speculum. A (32g) needle was used.   Injection:  1.25 mg Bevacizumab (AVASTIN) 1.'25mg'$ /0.085mSOLN   NDC: 5080mLot: [id number] date: 10/20/2020   Route: Intravitreal, Site: Left Eye, Waste: 0.05 mL  Post-op Post injection exam found visual acuity of at least counting fingers. The patient tolerated the procedure well. There were no complications. The patient received written and verbal post procedure care education. Post injection medications were not given.                 ASSESSMENT/PLAN:   ICD-10-CM   1. Proliferative diabetic retinopathy of both eyes with macular edema associated with type 2 diabetes mellitus (HCC)  E118/10/2022Intravitreal Injection, Pharmacologic Agent - OD - Right Eye    Intravitreal Injection, Pharmacologic Agent - OS - Left Eye    Bevacizumab (AVASTIN) SOLN 1.25 mg    Bevacizumab (  AVASTIN) SOLN 1.25 mg  2. Vitreous hemorrhage of both eyes ([place])  H43.13   3. Retinal edema  H35.81 OCT, Retina - OU - Both Eyes  4. Essential hypertension  I10   5. Hypertensive retinopathy of both eyes  H35.033   6. Acute CVA (cerebrovascular accident) ([place])  I63.9   7. Ocular hypertension, bilateral  H40.053   8. Combined forms of age-related cataract of right eye  H25.811   9. Pseudophakia  Z96.1    1-3. Proliferative diabetic retinopathy OU  - s/p PRP OD (10.02.19), (01.22.20), (02.12.20), (03.08.22)  - s/p PRP OS (05/02/2018), (02.19.20), (2.17.22)  - s/p IVK #1 OD (01.22.20), OS (02.12.20)  - s/p IVA OD #1 (10.21.20), #2 (12.02.20), #3 (01.13.21), #4 (02.10.21), #5 (04.19.21), #6 (05.21.21), #7 (06.21.21), #8 (08.19.21), #9 (10.11.21), #10 (12.01.21), #11 (01.25.22), #12 (03.01.22)  - s/p IVA OS #1 (12.02.20), #2 (04.19.21), #3 (05.21.21), #4 (06.21.21), #5 (08.19.21), #6 (10.11.21), #7 (12.01.21), #8 (01.25.22), #9 (03.01.22)  - FA 8.19.19 shows leaking MA, patches of capillary nonperfusion OU  - repeat FA 10.11.21 shows persistent NV OU, large patches of vascular nonperfusion OU (OS>OD) -- fill in PRP completed OU (OS  2.17.22, OD 3.8.22)  - BCVA OD: 20/50, OS: 20/70  - exam shows interval decrease in VH, preretinal and subhyaloid hemorrhage, persistent preretinal fibrosis OU  - OCT shows OD: mild interval improvement in vitreous traction -- scattered points of release of VMT; OS: Mild interval improvement in vitreous opacities, persistent retinal atrophy  - VH precautions reviewed -- minimize activities, keep head elevated, avoid ASA/NSAIDs/blood thinners as able  - pt on heparin  - discussed possibility of surgery being needed in the future to stabilize eye and prevent further progression of diabetic eye  disease  - recommend IVA OU (OD #13 and OS #10) today, 04.05.22 w/ ext to 6 wks  - pt wishes to proceed  - RBA of procedure discussed, questions answered  - see procedure note  - Avastin informed consent form signed and scanned on 01.13.2021  - f/u 6 wks, DFE, OCT  4,5. 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 [name] in mid August 2019 with a 2 wk history intractable headaches and decreased vision OU  - found to have BP of 170s/90s -- sent to ED and was admitted to [facility] where SBP was up to 200  - discussed importance of tight BP control   - pt reports improved compliance with BP medications and improvement in BP  6. 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 Limited 2 sequence MRI head: Acute RIGHT periatrial subcentimeter  nonhemorrhagic infarct, in region of optic tract.  - could be limiting vision -- will check formal visual field once macular edema improves  7. Ocular Hypertention OU  - today, IOP OD 16, OS 13  - patient reports not taking cosopt bid OU for about a month--IOP OK OU  8. Mixed cataract OD - The symptoms of cataract, surgical options, and treatments and risks were discussed with patient. - discussed diagnosis and progression - under the expert management of Dr. Kathlen Mody - clear from a retina standpoint to proceed with cataract surgery when patient and surgeon are ready  9. Pseudophakia OS  - s/p CE/IOL (07.29.21, Dr. Kathlen Mody)  - IOL in good position, doing well - monitor   Ophthalmic Meds Ordered this visit:  Meds ordered this encounter  Medications  . Bevacizumab (AVASTIN) SOLN  1.25 mg  . Bevacizumab (AVASTIN) SOLN 1.25 mg       Return in about 6 weeks (around 10/27/2020) for f/u PDR OU, DFE, OCT.  There are no Patient Instructions on file for this visit.   This document serves as a record of services personally performed by Gardiner Sleeper, MD, PhD. It was created on  their behalf by Estill Bakes, COT an ophthalmic technician. The creation of this record is the provider's dictation and/or activities during the visit.    Electronically signed by: Estill Bakes, COT 4.1.22 @ 9:13 AM  Gardiner Sleeper, M.D., Ph.D. Diseases & Surgery of the Retina and [place] 09/15/2020   I have reviewed the above documentation for accuracy and completeness, and I agree with the above. Gardiner Sleeper, M.D., Ph.D. 09/15/20 9:15 AM   Abbreviations: M myopia (nearsighted); A astigmatism; H hyperopia (farsighted); P presbyopia; Mrx spectacle prescription;  CTL contact lenses; OD right eye; OS left eye; OU both eyes  XT exotropia; ET esotropia; PEK punctate epithelial keratitis; PEE punctate epithelial erosions; DES dry eye syndrome; MGD meibomian gland dysfunction; ATs artificial tears; PFAT's preservative free artificial tears; [place] nuclear sclerotic cataract; PSC posterior subcapsular cataract; ERM epi-retinal membrane; PVD posterior vitreous detachment; RD retinal detachment; DM diabetes mellitus; DR diabetic retinopathy; NPDR non-proliferative diabetic retinopathy; PDR proliferative diabetic retinopathy; CSME clinically significant macular edema; DME diabetic macular edema; dbh dot blot hemorrhages; CWS cotton wool spot; POAG primary open angle glaucoma; C/D cup-to-disc ratio; HVF humphrey visual field; GVF goldmann visual field; OCT optical coherence tomography; IOP intraocular pressure; BRVO Branch retinal vein occlusion; CRVO central retinal vein occlusion; CRAO central retinal artery occlusion; BRAO branch retinal artery occlusion; RT retinal tear; SB scleral buckle; PPV pars plana vitrectomy; VH Vitreous hemorrhage; PRP panretinal laser photocoagulation; IVK intravitreal kenalog; VMT vitreomacular traction; MH Macular hole;  NVD neovascularization of the disc; NVE neovascularization elsewhere; AREDS age related eye disease study; ARMD age related  macular degeneration; POAG primary open angle glaucoma; EBMD epithelial/anterior basement membrane dystrophy; ACIOL anterior chamber intraocular lens; IOL intraocular lens; PCIOL posterior chamber intraocular lens; Phaco/IOL phacoemulsification with intraocular lens placement; [place] photorefractive keratectomy; LASIK laser assisted in situ keratomileusis; HTN hypertension; DM diabetes mellitus; COPD chronic obstructive pulmonary disease

## 2020-09-15 ENCOUNTER — Ambulatory Visit (INDEPENDENT_AMBULATORY_CARE_PROVIDER_SITE_OTHER): Payer: 59 | Admitting: Ophthalmology

## 2020-09-15 ENCOUNTER — Encounter (INDEPENDENT_AMBULATORY_CARE_PROVIDER_SITE_OTHER): Payer: Self-pay | Admitting: Ophthalmology

## 2020-09-15 ENCOUNTER — Other Ambulatory Visit: Payer: Self-pay

## 2020-09-15 DIAGNOSIS — I10 Essential (primary) hypertension: Secondary | ICD-10-CM

## 2020-09-15 DIAGNOSIS — H40053 Ocular hypertension, bilateral: Secondary | ICD-10-CM

## 2020-09-15 DIAGNOSIS — H3581 Retinal edema: Secondary | ICD-10-CM | POA: Diagnosis not present

## 2020-09-15 DIAGNOSIS — H4313 Vitreous hemorrhage, bilateral: Secondary | ICD-10-CM

## 2020-09-15 DIAGNOSIS — I639 Cerebral infarction, unspecified: Secondary | ICD-10-CM

## 2020-09-15 DIAGNOSIS — H35033 Hypertensive retinopathy, bilateral: Secondary | ICD-10-CM

## 2020-09-15 DIAGNOSIS — Z961 Presence of intraocular lens: Secondary | ICD-10-CM

## 2020-09-15 DIAGNOSIS — H25811 Combined forms of age-related cataract, right eye: Secondary | ICD-10-CM

## 2020-09-15 DIAGNOSIS — E113513 Type 2 diabetes mellitus with proliferative diabetic retinopathy with macular edema, bilateral: Secondary | ICD-10-CM

## 2020-09-15 MED ORDER — BEVACIZUMAB CHEMO INJECTION 1.25MG/0.05ML SYRINGE FOR KALEIDOSCOPE
1.2500 mg | INTRAVITREAL | Status: AC | PRN
  Administered 2020-09-15: 1.25 mg via INTRAVITREAL

## 2020-10-27 ENCOUNTER — Telehealth: Payer: Self-pay | Admitting: Internal Medicine

## 2020-10-27 DIAGNOSIS — F411 Generalized anxiety disorder: Secondary | ICD-10-CM

## 2020-10-27 NOTE — Telephone Encounter (Signed)
Office visit

## 2020-10-27 NOTE — Telephone Encounter (Signed)
Patient is scheduled for 10/28/2020.

## 2020-10-27 NOTE — Telephone Encounter (Signed)
Patient is calling and stated that he is experiencing some anxiety mostly at night and wanted to see if provider would recommend him going back on Zoloft, please advise. CB is [phone]   [id number] [place] [id number] Phone:  [phone]:  [phone]

## 2020-10-27 NOTE — Telephone Encounter (Signed)
Yes

## 2020-10-28 ENCOUNTER — Other Ambulatory Visit: Payer: Self-pay

## 2020-10-28 ENCOUNTER — Encounter: Payer: Self-pay | Admitting: Internal Medicine

## 2020-10-28 ENCOUNTER — Ambulatory Visit (INDEPENDENT_AMBULATORY_CARE_PROVIDER_SITE_OTHER): Payer: 59 | Admitting: Internal Medicine

## 2020-10-28 VITALS — BP 140/70 | HR 73 | Temp 98.2°F | Wt 189.8 lb

## 2020-10-28 DIAGNOSIS — N186 End stage renal disease: Secondary | ICD-10-CM | POA: Diagnosis not present

## 2020-10-28 DIAGNOSIS — Z992 Dependence on renal dialysis: Secondary | ICD-10-CM

## 2020-10-28 DIAGNOSIS — F411 Generalized anxiety disorder: Secondary | ICD-10-CM

## 2020-10-28 NOTE — Progress Notes (Signed)
Established Patient Office Visit     This visit occurred during the SARS-CoV-2 public health emergency.  Safety protocols were in place, including screening questions prior to the visit, additional usage of staff PPE, and extensive cleaning of exam room while observing appropriate contact time as indicated for disinfecting solutions.    CC/Reason for Visit: Discuss anxiety  HPI: Bill [name] is a 40 y.o. male who is coming in today for the above mentioned reasons. Past Medical History is significant for: End-stage renal disease on hemodialysis Monday, Wednesday, Friday, type 2 diabetes with significant diabetic retinopathy currently receiving intravitreal injections, prior history of nonhemorrhagic CVA, well-controlled hypertension, history of heart failure with preserved ejection fraction.  He gave me the fantastic news today that he is scheduled for a kidney transplant next week.  Unfortunately, this has come with some increased anxiety surrounding the outcomes of surgery.  Of note he has been diagnosed with generalized anxiety disorder and had been on 50 mg of sertraline which she decided to discontinue about 12 months ago after he felt like he no longer needed it.   Past Medical/Surgical History: Past Medical History:  Diagnosis Date  . Anemia   . Anxiety    situational  . Diabetes mellitus without complication ([place])   . Diabetic retinopathy ([place])    PDR OU  . ESRD on dialysis [facility])    Dialyzing T-Th-S  . History of blood transfusion   . Hypertension   . PONV (postoperative nausea and vomiting)    03/22/2019- only 15 years ago  . Renal disorder   . Stroke [facility]) 2019   vision    Past Surgical History:  Procedure Laterality Date  . A/V FISTULAGRAM N/A 03/11/2019   Procedure: A/V FISTULAGRAM - Left Arm;  Surgeon: Waynetta Sandy, MD;  Location: [place] [facility];  Service: Cardiovascular;  Laterality: N/A;  . arm surgery Right    Fracture  . AV FISTULA  PLACEMENT Left 01/25/2019   Procedure: ARTERIOVENOUS (AV) FISTULA CREATION  LEFT UPPER  ARM;  Surgeon: Serafina Mitchell, MD;  Location: [place];  Service: Vascular;  Laterality: Left;  . BASCILIC VEIN TRANSPOSITION Left 03/27/2019   Procedure: SECOND STAGE BASILIC VEIN TRANSPOSITION LEFT ARM;  Surgeon: Serafina Mitchell, MD;  Location: [place];  Service: Vascular;  Laterality: Left;  . WISDOM TOOTH EXTRACTION      Social History:  reports that he has never smoked. He has never used smokeless tobacco. He reports previous alcohol use. He reports previous drug use.  Allergies: Allergies  Allergen Reactions  . Food     Melons-itchy/headaches    Family History:  Family History  Problem Relation Age of Onset  . Diabetes Mellitus II Mother   . Hypertension Mother   . Cataracts Mother   . Diabetes Mellitus II Father   . Stroke Father   . Hypertension Father   . Glaucoma Maternal Grandmother   . Amblyopia Neg Hx   . Blindness Neg Hx   . Macular degeneration Neg Hx   . Retinal detachment Neg Hx   . Strabismus Neg Hx   . Retinitis pigmentosa Neg Hx      Current Outpatient Medications:  .  aspirin 81 MG EC tablet, Take 81 mg by mouth as needed., Disp: , Rfl:  .  atorvastatin (LIPITOR) 10 MG tablet, Take 1 tablet (10 mg total) by mouth daily., Disp: 90 tablet, Rfl: 1 .  AURYXIA 1 GM 210 MG(Fe) tablet, Take 210 mg  by mouth 3 (three) times daily with meals., Disp: , Rfl:  .  Dulaglutide (TRULICITY) A999333 0000000 SOPN, Inject 0.75 mg into the skin once a week., Disp: 2 mL, Rfl: 3 .  ferric citrate (AURYXIA) 1 GM 210 MG(Fe) tablet, Take 210 mg by mouth as directed., Disp: , Rfl:  .  fluticasone (FLONASE) 50 MCG/ACT nasal spray, Place 1 spray into both nostrils daily as needed for allergies or rhinitis., Disp: , Rfl:  .  Methoxy PEG-Epoetin Beta (MIRCERA IJ), Mircera, Disp: , Rfl:  .  multivitamin (RENA-VIT) TABS tablet, Take 1 tablet by mouth daily., Disp: , Rfl:  .  sertraline (ZOLOFT) 50 MG  tablet, TAKE 1 TABLET(50 MG) BY MOUTH DAILY (Patient not taking: Reported on 10/28/2020), Disp: 30 tablet, Rfl: 0  Review of Systems:  Constitutional: Denies fever, chills, diaphoresis, appetite change and fatigue.  HEENT: Denies photophobia, eye pain, redness, hearing loss, ear pain, congestion, sore throat, rhinorrhea, sneezing, mouth sores, trouble swallowing, neck pain, neck stiffness and tinnitus.   Respiratory: Denies SOB, DOE, cough, chest tightness,  and wheezing.   Cardiovascular: Denies chest pain, palpitations and leg swelling.  Gastrointestinal: Denies nausea, vomiting, abdominal pain, diarrhea, constipation, blood in stool and abdominal distention.  Genitourinary: Denies dysuria, urgency, frequency, hematuria, flank pain and difficulty urinating.  Endocrine: Denies: hot or cold intolerance, sweats, changes in hair or nails, polyuria, polydipsia. Musculoskeletal: Denies myalgias, back pain, joint swelling, arthralgias and gait problem.  Skin: Denies pallor, rash and wound.  Neurological: Denies dizziness, seizures, syncope, weakness, light-headedness, numbness and headaches.  Hematological: Denies adenopathy. Easy bruising, personal or family bleeding history  Psychiatric/Behavioral: Denies suicidal ideation, mood changes, confusion, nervousness, sleep disturbance and agitation   Physical Exam: Vitals:   10/28/20 0843  BP: 140/70  Pulse: 73  Temp: 98.2 F (36.8 C)  TempSrc: Oral  SpO2: 99%  Weight: 189 lb 12.8 oz (86.1 kg)    Body mass index is 26.47 kg/m.   Constitutional: NAD, calm, comfortable Eyes: PERRL, lids and conjunctivae normal ENMT: Mucous membranes are moist.  Respiratory: clear to auscultation bilaterally, no wheezing, no crackles. Normal respiratory effort. No accessory muscle use.  Cardiovascular: Regular rate and rhythm, no murmurs / rubs / gallops. No extremity edema.  Neurologic: Grossly intact and nonfocal Psychiatric: Normal judgment and insight.  Alert and oriented x 3. Normal mood.    Impression and Plan:  GAD (generalized anxiety disorder) -I will go ahead and restart his sertraline 50 mg daily. -He will follow-up with me in 3 months.  ESRD (end stage renal disease) on dialysis ([place]) -Scheduled for kidney transplant next week.    Lelon Frohlich, MD [facility]

## 2020-10-28 NOTE — Progress Notes (Shared)
[facility] Note  11/03/2020     CHIEF COMPLAINT Patient presents for No chief complaint on file.   HISTORY OF PRESENT ILLNESS: Bill [name] is a 40 y.o. male who presents to the clinic today for:     Referring physician: Isaac Bliss, Rayford Halsted, MD [address],  [place] [id number]  HISTORICAL INFORMATION:   Selected notes from the MEDICAL RECORD NUMBER Referred by Dr. [name] for concern of severe NPDR / PDR OU   CURRENT MEDICATIONS: No current outpatient medications on file. (Ophthalmic Drugs)   No current facility-administered medications for this visit. (Ophthalmic Drugs)   Current Outpatient Medications (Other)  Medication Sig  . aspirin 81 MG EC tablet Take 81 mg by mouth as needed.  Marland Kitchen atorvastatin (LIPITOR) 10 MG tablet Take 1 tablet (10 mg total) by mouth daily.  [name] 1 GM 210 MG(Fe) tablet Take 210 mg by mouth 3 (three) times daily with meals.  . Dulaglutide (TRULICITY) [id number] [id number] SOPN Inject 0.75 mg into the skin once a week.  . ferric citrate (AURYXIA) 1 GM 210 MG(Fe) tablet Take 210 mg by mouth as directed.  . fluticasone (FLONASE) 50 MCG/ACT nasal spray Place 1 spray into both nostrils daily as needed for allergies or rhinitis.  . Methoxy PEG-Epoetin Beta (MIRCERA IJ) Mircera  . multivitamin (RENA-VIT) TABS tablet Take 1 tablet by mouth daily.  . sertraline (ZOLOFT) 50 MG tablet TAKE 1 TABLET(50 MG) BY MOUTH DAILY (Patient not taking: Reported on 10/28/2020)   No current facility-administered medications for this visit. (Other)      REVIEW OF SYSTEMS:    ALLERGIES Allergies  Allergen Reactions  . Food     Melons-itchy/headaches    PAST MEDICAL HISTORY Past Medical History:  Diagnosis Date  . Anemia   . Anxiety    situational  . Diabetes mellitus without complication ([place])   . Diabetic retinopathy ([place])    PDR OU  . ESRD on dialysis [facility])    Dialyzing T-Th-S  . History of blood  transfusion   . Hypertension   . PONV (postoperative nausea and vomiting)    03/22/2019- only 15 years ago  . Renal disorder   . Stroke [facility]) 2019   vision   Past Surgical History:  Procedure Laterality Date  . A/V FISTULAGRAM N/A 03/11/2019   Procedure: A/V FISTULAGRAM - Left Arm;  Surgeon: Waynetta Sandy, MD;  Location: [facility];  Service: Cardiovascular;  Laterality: N/A;  . arm surgery Right    Fracture  . AV FISTULA PLACEMENT Left 01/25/2019   Procedure: ARTERIOVENOUS (AV) FISTULA CREATION  LEFT UPPER  ARM;  Surgeon: Serafina Mitchell, MD;  Location: [facility];  Service: Vascular;  Laterality: Left;  . BASCILIC VEIN TRANSPOSITION Left 03/27/2019   Procedure: SECOND STAGE BASILIC VEIN TRANSPOSITION LEFT ARM;  Surgeon: Serafina Mitchell, MD;  Location: [facility];  Service: Vascular;  Laterality: Left;  . WISDOM TOOTH EXTRACTION      FAMILY HISTORY Family History  Problem Relation Age of Onset  . Diabetes Mellitus II Mother   . Hypertension Mother   . Cataracts Mother   . Diabetes Mellitus II Father   . Stroke Father   . Hypertension Father   . Glaucoma Maternal Grandmother   . Amblyopia Neg Hx   . Blindness Neg Hx   . Macular degeneration Neg Hx   . Retinal detachment Neg Hx   . Strabismus Neg  Hx   . Retinitis pigmentosa Neg Hx     SOCIAL HISTORY Social History   Tobacco Use  . Smoking status: Never Smoker  . Smokeless tobacco: Never Used  Vaping Use  . Vaping Use: Never used  Substance Use Topics  . Alcohol use: Not Currently  . Drug use: Not Currently         OPHTHALMIC EXAM:  Not recorded     IMAGING AND PROCEDURES  Imaging and Procedures for '@TODAY'$ @           ASSESSMENT/PLAN: No diagnosis found. 1-3. Proliferative diabetic retinopathy OU  - s/p PRP OD (10.02.19), (01.22.20), (02.12.20), (03.08.22)  - s/p PRP OS (05/02/2018), (02.19.20), (2.17.22)  - s/p IVK #1 OD (01.22.20), OS (02.12.20)  - s/p IVA OD #1 (10.21.20), #2  (12.02.20), #3 (01.13.21), #4 (02.10.21), #5 (04.19.21), #6 (05.21.21), #7 (06.21.21), #8 (08.19.21), #9 (10.11.21), #10 (12.01.21), #11 (01.25.22), #12 (03.01.22), #13 (4.5.22)  - s/p IVA OS #1 (12.02.20), #2 (04.19.21), #3 (05.21.21), #4 (06.21.21), #5 (08.19.21), #6 (10.11.21), #7 (12.01.21), #8 (01.25.22), #9 (03.01.22), #10 (4.5.22)  - FA 8.19.19 shows leaking MA, patches of capillary nonperfusion OU  - repeat FA 10.11.21 shows persistent NV OU, large patches of vascular nonperfusion OU (OS>OD) -- fill in PRP completed OU (OS  2.17.22, OD 3.8.22)  - BCVA OD: 20/50, OS: 20/70  - exam shows interval decrease in VH, preretinal and subhyaloid hemorrhage, persistent preretinal fibrosis OU  - OCT shows OD: mild interval improvement in vitreous traction -- scattered points of release of VMT; OS: Mild interval improvement in vitreous opacities, persistent retinal atrophy  - VH precautions reviewed -- minimize activities, keep head elevated, avoid ASA/NSAIDs/blood thinners as able  - pt on heparin  - discussed possibility of surgery being needed in the future to stabilize eye and prevent further progression of diabetic eye disease  - recommend IVA OU (OD #14 and OS #11) today, 5.24.22  - pt wishes to proceed  - RBA of procedure discussed, questions answered  - see procedure note  - [place] informed consent form signed and scanned on 01.13.2021  - f/u 6 wks, DFE, OCT  4,5. 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 [name] in mid August 2019 with a 2 wk history intractable headaches and decreased vision OU  - found to have BP of 170s/90s -- sent to ED and was admitted to [facility] where SBP was up to 200  - discussed importance of tight BP control   - pt reports improved compliance with BP medications and improvement in BP  6. 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 Limited 2 sequence MRI head: Acute RIGHT periatrial  subcentimeter  nonhemorrhagic infarct, in region of optic tract.  - could be limiting vision -- will check formal visual field once macular edema improves  7. Ocular Hypertention OU  - today, IOP OD 16, OS 13  - patient reports not taking cosopt bid OU for about a month--IOP OK OU  8. Mixed cataract OD - The symptoms of cataract, surgical options, and treatments and risks were discussed with patient. - discussed diagnosis and progression - under the expert management of Dr. Kathlen Mody - clear from a retina standpoint to proceed with cataract surgery when patient and surgeon are ready  9. Pseudophakia OS  - s/p CE/IOL (07.29.21, Dr. Kathlen Mody)  - IOL in good position, doing well - monitor   Ophthalmic Meds Ordered this  visit:  No orders of the defined types were placed in this encounter.      No follow-ups on file.  There are no Patient Instructions on file for this visit.   This document serves as a record of services personally performed by Gardiner Sleeper, MD, PhD. It was created on their behalf by Estill Bakes, COT an ophthalmic technician. The creation of this record is the provider's dictation and/or activities during the visit.    Electronically signed by: Estill Bakes, COT 5.18.22 @ 1:13 PM  Gardiner Sleeper, M.D., Ph.D. Diseases & Surgery of the Retina and Vitreous [facility]: M myopia (nearsighted); A astigmatism; H hyperopia (farsighted); P presbyopia; Mrx spectacle prescription;  CTL contact lenses; OD right eye; OS left eye; OU both eyes  XT exotropia; ET esotropia; PEK punctate epithelial keratitis; PEE punctate epithelial erosions; DES dry eye syndrome; MGD meibomian gland dysfunction; ATs artificial tears; PFAT's preservative free artificial tears; [place] nuclear sclerotic cataract; PSC posterior subcapsular cataract; ERM epi-retinal membrane; PVD posterior vitreous detachment; RD retinal detachment; DM diabetes mellitus; DR  diabetic retinopathy; NPDR non-proliferative diabetic retinopathy; PDR proliferative diabetic retinopathy; CSME clinically significant macular edema; DME diabetic macular edema; dbh dot blot hemorrhages; CWS cotton wool spot; POAG primary open angle glaucoma; C/D cup-to-disc ratio; HVF humphrey visual field; GVF goldmann visual field; OCT optical coherence tomography; IOP intraocular pressure; BRVO Branch retinal vein occlusion; CRVO central retinal vein occlusion; CRAO central retinal artery occlusion; BRAO branch retinal artery occlusion; RT retinal tear; SB scleral buckle; PPV pars plana vitrectomy; VH Vitreous hemorrhage; PRP panretinal laser photocoagulation; IVK intravitreal kenalog; VMT vitreomacular traction; MH Macular hole;  NVD neovascularization of the disc; NVE neovascularization elsewhere; AREDS age related eye disease study; ARMD age related macular degeneration; POAG primary open angle glaucoma; EBMD epithelial/anterior basement membrane dystrophy; ACIOL anterior chamber intraocular lens; IOL intraocular lens; PCIOL posterior chamber intraocular lens; Phaco/IOL phacoemulsification with intraocular lens placement; [place] photorefractive keratectomy; LASIK laser assisted in situ keratomileusis; HTN hypertension; DM diabetes mellitus; COPD chronic obstructive pulmonary disease

## 2020-10-29 MED ORDER — SERTRALINE HCL 50 MG PO TABS: ORAL_TABLET | ORAL | 1 refills | Status: DC

## 2020-10-29 NOTE — Telephone Encounter (Signed)
Pt is calling in stating that he was seen on 10/28/2020 and went to the pharmacy today to pick up his medication Rx sertraline (ZOLOFT) 50 MG Pharm:  [facility].

## 2020-10-29 NOTE — Addendum Note (Signed)
Addended by: Westley Hummer B on: 10/29/2020 11:21 AM   Modules accepted: Orders

## 2020-10-29 NOTE — Telephone Encounter (Signed)
Refill sent.

## 2020-11-03 ENCOUNTER — Encounter (INDEPENDENT_AMBULATORY_CARE_PROVIDER_SITE_OTHER): Payer: 59 | Admitting: Ophthalmology

## 2020-11-03 DIAGNOSIS — H3581 Retinal edema: Secondary | ICD-10-CM

## 2020-11-03 DIAGNOSIS — H25811 Combined forms of age-related cataract, right eye: Secondary | ICD-10-CM

## 2020-11-03 DIAGNOSIS — H35033 Hypertensive retinopathy, bilateral: Secondary | ICD-10-CM

## 2020-11-03 DIAGNOSIS — I639 Cerebral infarction, unspecified: Secondary | ICD-10-CM

## 2020-11-03 DIAGNOSIS — H4313 Vitreous hemorrhage, bilateral: Secondary | ICD-10-CM

## 2020-11-03 DIAGNOSIS — I10 Essential (primary) hypertension: Secondary | ICD-10-CM

## 2020-11-03 DIAGNOSIS — H40053 Ocular hypertension, bilateral: Secondary | ICD-10-CM

## 2020-11-03 DIAGNOSIS — E113513 Type 2 diabetes mellitus with proliferative diabetic retinopathy with macular edema, bilateral: Secondary | ICD-10-CM

## 2020-11-03 DIAGNOSIS — Z961 Presence of intraocular lens: Secondary | ICD-10-CM

## 2020-11-05 HISTORY — PX: KIDNEY TRANSPLANT: SHX239

## 2020-11-13 ENCOUNTER — Encounter (INDEPENDENT_AMBULATORY_CARE_PROVIDER_SITE_OTHER): Payer: 59 | Admitting: Ophthalmology

## 2020-11-13 DIAGNOSIS — H4313 Vitreous hemorrhage, bilateral: Secondary | ICD-10-CM

## 2020-11-13 DIAGNOSIS — I10 Essential (primary) hypertension: Secondary | ICD-10-CM

## 2020-11-13 DIAGNOSIS — H35033 Hypertensive retinopathy, bilateral: Secondary | ICD-10-CM

## 2020-11-13 DIAGNOSIS — E113513 Type 2 diabetes mellitus with proliferative diabetic retinopathy with macular edema, bilateral: Secondary | ICD-10-CM

## 2020-11-13 DIAGNOSIS — H25811 Combined forms of age-related cataract, right eye: Secondary | ICD-10-CM

## 2020-11-13 DIAGNOSIS — H25813 Combined forms of age-related cataract, bilateral: Secondary | ICD-10-CM

## 2020-11-13 DIAGNOSIS — I639 Cerebral infarction, unspecified: Secondary | ICD-10-CM

## 2020-11-13 DIAGNOSIS — Z961 Presence of intraocular lens: Secondary | ICD-10-CM

## 2020-11-13 DIAGNOSIS — H3581 Retinal edema: Secondary | ICD-10-CM

## 2020-11-13 DIAGNOSIS — H40053 Ocular hypertension, bilateral: Secondary | ICD-10-CM

## 2020-11-13 DIAGNOSIS — Z9119 Patient's noncompliance with other medical treatment and regimen: Secondary | ICD-10-CM

## 2020-11-16 ENCOUNTER — Encounter (INDEPENDENT_AMBULATORY_CARE_PROVIDER_SITE_OTHER): Payer: Self-pay | Admitting: Ophthalmology

## 2020-11-16 ENCOUNTER — Other Ambulatory Visit: Payer: Self-pay

## 2020-11-16 ENCOUNTER — Ambulatory Visit (INDEPENDENT_AMBULATORY_CARE_PROVIDER_SITE_OTHER): Payer: 59 | Admitting: Ophthalmology

## 2020-11-16 DIAGNOSIS — H25811 Combined forms of age-related cataract, right eye: Secondary | ICD-10-CM

## 2020-11-16 DIAGNOSIS — Z961 Presence of intraocular lens: Secondary | ICD-10-CM

## 2020-11-16 DIAGNOSIS — H35033 Hypertensive retinopathy, bilateral: Secondary | ICD-10-CM

## 2020-11-16 DIAGNOSIS — H3581 Retinal edema: Secondary | ICD-10-CM | POA: Diagnosis not present

## 2020-11-16 DIAGNOSIS — E113513 Type 2 diabetes mellitus with proliferative diabetic retinopathy with macular edema, bilateral: Secondary | ICD-10-CM

## 2020-11-16 DIAGNOSIS — I10 Essential (primary) hypertension: Secondary | ICD-10-CM | POA: Diagnosis not present

## 2020-11-16 DIAGNOSIS — H4313 Vitreous hemorrhage, bilateral: Secondary | ICD-10-CM

## 2020-11-16 DIAGNOSIS — I639 Cerebral infarction, unspecified: Secondary | ICD-10-CM

## 2020-11-16 DIAGNOSIS — H40053 Ocular hypertension, bilateral: Secondary | ICD-10-CM

## 2020-11-16 MED ORDER — BEVACIZUMAB CHEMO INJECTION 1.25MG/0.05ML SYRINGE FOR KALEIDOSCOPE
1.2500 mg | INTRAVITREAL | Status: AC | PRN
  Administered 2020-11-16: 1.25 mg via INTRAVITREAL

## 2020-11-16 NOTE — Progress Notes (Signed)
[facility] Note  11/16/2020     CHIEF COMPLAINT Patient presents for Retina Follow Up   HISTORY OF PRESENT ILLNESS: Bill [name] is a 40 y.o. male who presents to the clinic today for:   HPI    Retina Follow Up    Patient presents with  Diabetic Retinopathy.  In both eyes.  Duration of 9 weeks.  Since onset it is stable.  I, the attending physician,  performed the HPI with the patient and updated documentation appropriately.          Comments    Pt here for 9wk retinal follow up PDR OU. Pt states vision is about the same. He did have a kidney transplant May 26th at [facility], did not bring his medication updates with him and will call back with those. Hasnt checked his BS in the recent days.        Last edited by [name], MD on 11/16/2020 12:02 PM. (History)    pt states he had a kidney transplant on May 26th, he states everything has been going well since then, he is off dialysis now, he states vision seems stable, but he could tell it was time for injections, his last A1c was 7.2 on 01.13.22  Referring physician: Arlyss Queen, MD [address],  [place] [address]  HISTORICAL INFORMATION:   Selected notes from the [place] Referred by Dr. [name] for concern of severe NPDR / PDR OU   CURRENT MEDICATIONS: No current outpatient medications on file. (Ophthalmic Drugs)   No current facility-administered medications for this visit. (Ophthalmic Drugs)   Current Outpatient Medications (Other)  Medication Sig  . aspirin 81 MG EC tablet Take 81 mg by mouth as needed.  Marland Kitchen atorvastatin (LIPITOR) 10 MG tablet Take 1 tablet (10 mg total) by mouth daily.  [name] 1 GM 210 MG(Fe) tablet Take 210 mg by mouth 3 (three) times daily with meals.  . Dulaglutide (TRULICITY) [id number] SOPN Inject 0.75 mg into the skin once a week.  . ferric citrate (AURYXIA) 1 GM 210 MG(Fe) tablet Take 210 mg by mouth as directed.   . fluticasone (FLONASE) 50 MCG/ACT nasal spray Place 1 spray into both nostrils daily as needed for allergies or rhinitis.  . Methoxy PEG-Epoetin Beta (MIRCERA IJ) Mircera  . multivitamin (RENA-VIT) TABS tablet Take 1 tablet by mouth daily.  . sertraline (ZOLOFT) 50 MG tablet TAKE 1 TABLET(50 MG) BY MOUTH DAILY   No current facility-administered medications for this visit. (Other)      REVIEW OF SYSTEMS: ROS    Positive for: Neurological, Genitourinary, Endocrine, Cardiovascular, Eyes, Psychiatric   Negative for: Constitutional, Gastrointestinal, Skin, Musculoskeletal, HENT, Respiratory, Allergic/Imm, Heme/Lymph   Last edited by Kingsley Spittle, COT on 11/16/2020  8:37 AM. (History)       ALLERGIES Allergies  Allergen Reactions  . Food     Melons-itchy/headaches    PAST MEDICAL HISTORY Past Medical History:  Diagnosis Date  . Anemia   . Anxiety    situational  . Diabetes mellitus without complication ([place])   . Diabetic retinopathy ([place])    PDR OU  . ESRD on dialysis [facility])    Dialyzing T-Th-S  . History of blood transfusion   . Hypertension   . PONV (postoperative nausea and vomiting)    03/22/2019- only 15 years ago  . Renal disorder   . Stroke [facility]) 2019   vision   Past  Surgical History:  Procedure Laterality Date  . A/V FISTULAGRAM N/A 03/11/2019   Procedure: A/V FISTULAGRAM - Left Arm;  Surgeon: Waynetta Sandy, MD;  Location: [place] [facility];  Service: Cardiovascular;  Laterality: N/A;  . arm surgery Right    Fracture  . AV FISTULA PLACEMENT Left 01/25/2019   Procedure: ARTERIOVENOUS (AV) FISTULA CREATION  LEFT UPPER  ARM;  Surgeon: Serafina Mitchell, MD;  Location: [place];  Service: Vascular;  Laterality: Left;  . BASCILIC VEIN TRANSPOSITION Left 03/27/2019   Procedure: SECOND STAGE BASILIC VEIN TRANSPOSITION LEFT ARM;  Surgeon: Serafina Mitchell, MD;  Location: [place];  Service: Vascular;  Laterality: Left;  . KIDNEY TRANSPLANT  11/05/2020    [facility]  . WISDOM TOOTH EXTRACTION      FAMILY HISTORY Family History  Problem Relation Age of Onset  . Diabetes Mellitus II Mother   . Hypertension Mother   . Cataracts Mother   . Diabetes Mellitus II Father   . Stroke Father   . Hypertension Father   . Glaucoma Maternal Grandmother   . Amblyopia Neg Hx   . Blindness Neg Hx   . Macular degeneration Neg Hx   . Retinal detachment Neg Hx   . Strabismus Neg Hx   . Retinitis pigmentosa Neg Hx     SOCIAL HISTORY Social History   Tobacco Use  . Smoking status: Never Smoker  . Smokeless tobacco: Never Used  Vaping Use  . Vaping Use: Never used  Substance Use Topics  . Alcohol use: Not Currently  . Drug use: Not Currently         OPHTHALMIC EXAM:  Base Eye Exam    Visual Acuity (Snellen - Linear)      Right Left   Dist [place] 20/70 +1 20/80   Dist ph [place] 20/50 20/60 -2       Tonometry (Tonopen, 8:49 AM)      Right Left   Pressure 13 14       Pupils      Dark Light Shape React APD   Right 4 3 Round Brisk None   Left 4 3 Round Brisk None       Visual Fields (Counting fingers)      Left Right    Full Full       Extraocular Movement      Right Left    Full, Ortho Full, Ortho       Neuro/Psych    Oriented x3: Yes   Mood/Affect: Normal       Dilation    Both eyes: 1.0% Mydriacyl, 2.5% Phenylephrine @ 8:50 AM        Slit Lamp and Fundus Exam    Slit Lamp Exam      Right Left   Lids/Lashes Normal Normal   Conjunctiva/Sclera White and quiet White and quiet   Cornea Trace Punctate epithelial erosions Trace Punctate epithelial erosions   Anterior Chamber Deep and quiet Deep and quiet   Iris Round and dilated, No NVI Round and dilated, no NVI   Lens 1+ Nuclear sclerosis, 1-2+ Cortical cataract, 2+Posterior subcapsular cataract PC IOL in good position   Vitreous VH improved, scattered fibrosis superior to disc, white blood stained vitreous condensations -- slightly improving, old, white blood  clots settled inferiorly Syneresis, white blood stained vitreous condensations settling inferiorly and improving       Fundus Exam      Right Left   Disc 2-3+Pallor, sharp rim, fibrosis  2-3+Pallor, Sharp rim, Compact, +fibrosis/regressing NVD, no heme   C/D Ratio 0.1 0.2   Macula Blunted foveal reflex, +fibrosis, +exudates, IRH/MA (improving), ERM with macular pseudo hole like appearance Flat, blunted foveal reflex, atrophic, flat choroidal nevus superior macula   Vessels Severe attenuation, Tortuous, +fibrosis, +NV inferior to disc -- regressing Sclerotic arterioles, Tortuous, +NV -- regressing, fibrosis along superior arcades, severe Vascular attenuation   Periphery Attached, 360 PRP w/ good posterior fill in, tractional fibrosis superior and along arcades, greatest above disc Attached; 360 PRP in place, good posterior fill in, scattered tractional fibrosis          IMAGING AND PROCEDURES  Imaging and Procedures for '@TODAY'$ @  OCT, Retina - OU - Both Eyes       Right Eye Quality was good. Central Foveal Thickness: 324. Progression has been stable. Findings include abnormal foveal contour, intraretinal hyper-reflective material, preretinal fibrosis, vitreous traction, epiretinal membrane, no SRF, macular pucker, inner retinal atrophy, no IRF (Persistent PRF, mild progression of ERM).   Left Eye Quality was good. Central Foveal Thickness: 225. Progression has been stable. Findings include abnormal foveal contour, intraretinal hyper-reflective material, no SRF, inner retinal atrophy, epiretinal membrane, preretinal fibrosis, outer retinal atrophy, vitreomacular adhesion , no IRF, vitreous traction (persistent retinal atrophy, tractional edema nasal to disc).   Notes *Images captured and stored on drive  Diagnosis / Impression:  PDR w/ tractional fibrosis OU OD: Persistent PRF, mild progression of ERM OS: persistent retinal atrophy, tractional edema nasal to disc  Clinical management:   See below  Abbreviations: NFP - Normal foveal profile. CME - cystoid macular edema. PED - pigment epithelial detachment. IRF - intraretinal fluid. SRF - subretinal fluid. EZ - ellipsoid zone. ERM - epiretinal membrane. ORA - outer retinal atrophy. ORT - outer retinal tubulation. SRHM - subretinal hyper-reflective material         Intravitreal Injection, Pharmacologic Agent - OD - Right Eye       Time Out 11/16/2020. 9:17 AM. Confirmed correct patient, procedure, site, and patient consented.   Anesthesia Topical anesthesia was used. Anesthetic medications included Lidocaine 2%, Proparacaine 0.5%.   Procedure Preparation included 5% betadine to ocular surface, eyelid speculum. A supplied needle was used.   Injection:  1.25 mg Bevacizumab (AVASTIN) 1.'25mg'$ /0.21m SOLN   NDC: [id number] Lot: [id number]'@14'$ , Expiration date: 12/23/2020   Route: Intravitreal, Site: Right Eye, Waste: 0 mL  Post-op Post injection exam found visual acuity of at least counting fingers. The patient tolerated the procedure well. There were no complications. The patient received written and verbal post procedure care education. Post injection medications were not given.        Intravitreal Injection, Pharmacologic Agent - OS - Left Eye       Time Out 11/16/2020. 9:18 AM. Confirmed correct patient, procedure, site, and patient consented.   Anesthesia Topical anesthesia was used. Anesthetic medications included Lidocaine 2%, Proparacaine 0.5%.   Procedure Preparation included 5% betadine to ocular surface, eyelid speculum. A (32g) needle was used.   Injection:  1.25 mg Bevacizumab (AVASTIN) 1.'25mg'$ /0.083mSOLN   NDC: 5080mLot: [id number] date: 12/23/2020   Route: Intravitreal, Site: Left Eye, Waste: 0.05 mL  Post-op Post injection exam found visual acuity of at least counting fingers. The patient tolerated the procedure well. There were no complications. The patient received written and  verbal post procedure care education. Post injection medications were not given.                 ASSESSMENT/PLAN:  ICD-10-CM   1. Proliferative diabetic retinopathy of both eyes with macular edema associated with type 2 diabetes mellitus (HCC)  [id number] Intravitreal Injection, Pharmacologic Agent - OD - Right Eye    Intravitreal Injection, Pharmacologic Agent - OS - Left Eye    Bevacizumab (AVASTIN) SOLN 1.25 mg    Bevacizumab (AVASTIN) SOLN 1.25 mg  2. Vitreous hemorrhage of both eyes ([place])  H43.13   3. Retinal edema  H35.81 OCT, Retina - OU - Both Eyes  4. Essential hypertension  I10   5. Hypertensive retinopathy of both eyes  H35.033   6. Acute CVA (cerebrovascular accident) ([place])  I63.9   7. Ocular hypertension, bilateral  H40.053   8. Combined forms of age-related cataract of right eye  H25.811   9. Pseudophakia  Z96.1    1-3. Proliferative diabetic retinopathy OU  - last A1c was 7.2 on 01.13.22  - s/p PRP OD (10.02.19), (01.22.20), (02.12.20), (03.08.22)  - s/p PRP OS (05/02/2018), (02.19.20), (2.17.22)  - s/p IVK #1 OD (01.22.20), OS (02.12.20)  - s/p IVA OD #1 (10.21.20), #2 (12.02.20), #3 (01.13.21), #4 (02.10.21), #5 (04.19.21), #6 (05.21.21), #7 (06.21.21), #8 (08.19.21), #9 (10.11.21), #10 (12.01.21), #11 (01.25.22), #12 (03.01.22), #13 (04.05.22)  - s/p IVA OS #1 (12.02.20), #2 (04.19.21), #3 (05.21.21), #4 (06.21.21), #5 (08.19.21), #6 (10.11.21), #7 (12.01.21), #8 (01.25.22), #9 (03.01.22), #10 (04.05.22)  - FA 8.19.19 shows leaking MA, patches of capillary nonperfusion OU  - repeat FA 10.11.21 shows persistent NV OU, large patches of vascular nonperfusion OU (OS>OD) -- fill in PRP completed OU (OS  2.17.22, OD 3.8.22)  - BCVA OD: 20/50, OS: 20/60  - exam shows interval decrease in VH, preretinal and subhyaloid hemorrhage, persistent preretinal fibrosis OU  - OCT shows OD: Persistent PRF, mild progression of ERM; OS: persistent retinal atrophy, tractional edema  nasal to disc  - VH precautions reviewed -- minimize activities, keep head elevated, avoid ASA/NSAIDs/blood thinners as able  - pt on heparin  - discussed possibility of surgery being needed in the future to stabilize eye and prevent further progression of diabetic eye disease  - recommend IVA OU (OD #14 and OS #11) today, 06.06.22 w/ return again in 8 wks  - pt wishes to proceed  - RBA of procedure discussed, questions answered  - see procedure note  - Avastin informed consent form signed and scanned on 01.13.2021  - f/u 8 wks, DFE, OCT  4,5. 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 [name] in mid August 2019 with a 2 wk history intractable headaches and decreased vision OU  - found to have BP of 170s/90s -- sent to ED and was admitted to [facility] where SBP was up to 200  - discussed importance of tight BP control   - pt reports improved compliance with BP medications and improvement in BP  6. 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 Limited 2 sequence MRI head: Acute RIGHT periatrial subcentimeter  nonhemorrhagic infarct, in region of optic tract.  - could be limiting vision -- will check formal visual field once macular edema improves  7. Ocular Hypertention OU  - today, IOP OD 16, OS 13  - patient reports not taking cosopt bid OU for about a month--IOP OK OU  8. Mixed cataract OD - The symptoms of cataract, surgical options, and treatments and risks were discussed with patient. - discussed diagnosis and progression - under the expert management  of Dr. Kathlen Mody - clear from a retina standpoint to proceed with cataract surgery when patient and surgeon are ready  9. Pseudophakia OS  - s/p CE/IOL (07.29.21, Dr. Kathlen Mody)  - IOL in good position, doing well - monitor   Ophthalmic Meds Ordered this visit:  Meds ordered this encounter  Medications  . Bevacizumab (AVASTIN) SOLN 1.25 mg  . Bevacizumab  (AVASTIN) SOLN 1.25 mg       Return in about 8 weeks (around 01/11/2021) for f/u PDR OU, DFE, OCT.  There are no Patient Instructions on file for this visit.   This document serves as a record of services personally performed by Gardiner Sleeper, MD, PhD. It was created on their behalf by San Jetty. Owens Shark, OA an ophthalmic technician. The creation of this record is the provider's dictation and/or activities during the visit.    Electronically signed by: San Jetty. Marguerita Merles 06.06.2022 12:06 PM  Gardiner Sleeper, M.D., Ph.D. Diseases & Surgery of the Retina and Vitreous [facility]  I have reviewed the above documentation for accuracy and completeness, and I agree with the above. Gardiner Sleeper, M.D., Ph.D. 11/16/20 12:06 PM   Abbreviations: M myopia (nearsighted); A astigmatism; H hyperopia (farsighted); P presbyopia; Mrx spectacle prescription;  CTL contact lenses; OD right eye; OS left eye; OU both eyes  XT exotropia; ET esotropia; PEK punctate epithelial keratitis; PEE punctate epithelial erosions; DES dry eye syndrome; MGD meibomian gland dysfunction; ATs artificial tears; PFAT's preservative free artificial tears; [place] nuclear sclerotic cataract; PSC posterior subcapsular cataract; ERM epi-retinal membrane; PVD posterior vitreous detachment; RD retinal detachment; DM diabetes mellitus; DR diabetic retinopathy; NPDR non-proliferative diabetic retinopathy; PDR proliferative diabetic retinopathy; CSME clinically significant macular edema; DME diabetic macular edema; dbh dot blot hemorrhages; CWS cotton wool spot; POAG primary open angle glaucoma; C/D cup-to-disc ratio; HVF humphrey visual field; GVF goldmann visual field; OCT optical coherence tomography; IOP intraocular pressure; BRVO Branch retinal vein occlusion; CRVO central retinal vein occlusion; CRAO central retinal artery occlusion; BRAO branch retinal artery occlusion; RT retinal tear; SB scleral buckle; PPV pars  plana vitrectomy; VH Vitreous hemorrhage; PRP panretinal laser photocoagulation; IVK intravitreal kenalog; VMT vitreomacular traction; MH Macular hole;  NVD neovascularization of the disc; NVE neovascularization elsewhere; AREDS age related eye disease study; ARMD age related macular degeneration; POAG primary open angle glaucoma; EBMD epithelial/anterior basement membrane dystrophy; ACIOL anterior chamber intraocular lens; IOL intraocular lens; PCIOL posterior chamber intraocular lens; Phaco/IOL phacoemulsification with intraocular lens placement; [place] photorefractive keratectomy; LASIK laser assisted in situ keratomileusis; HTN hypertension; DM diabetes mellitus; COPD chronic obstructive pulmonary disease

## 2021-01-11 ENCOUNTER — Encounter (INDEPENDENT_AMBULATORY_CARE_PROVIDER_SITE_OTHER): Payer: 59 | Admitting: Ophthalmology

## 2021-01-11 DIAGNOSIS — H25813 Combined forms of age-related cataract, bilateral: Secondary | ICD-10-CM

## 2021-01-11 DIAGNOSIS — Z9119 Patient's noncompliance with other medical treatment and regimen: Secondary | ICD-10-CM

## 2021-01-11 DIAGNOSIS — H25811 Combined forms of age-related cataract, right eye: Secondary | ICD-10-CM

## 2021-01-11 DIAGNOSIS — E113313 Type 2 diabetes mellitus with moderate nonproliferative diabetic retinopathy with macular edema, bilateral: Secondary | ICD-10-CM

## 2021-01-11 DIAGNOSIS — E113511 Type 2 diabetes mellitus with proliferative diabetic retinopathy with macular edema, right eye: Secondary | ICD-10-CM

## 2021-01-11 DIAGNOSIS — H4313 Vitreous hemorrhage, bilateral: Secondary | ICD-10-CM

## 2021-01-11 DIAGNOSIS — I10 Essential (primary) hypertension: Secondary | ICD-10-CM

## 2021-01-11 DIAGNOSIS — H3581 Retinal edema: Secondary | ICD-10-CM

## 2021-01-11 DIAGNOSIS — I639 Cerebral infarction, unspecified: Secondary | ICD-10-CM

## 2021-01-11 DIAGNOSIS — H35033 Hypertensive retinopathy, bilateral: Secondary | ICD-10-CM

## 2021-01-11 DIAGNOSIS — E113513 Type 2 diabetes mellitus with proliferative diabetic retinopathy with macular edema, bilateral: Secondary | ICD-10-CM

## 2021-01-11 DIAGNOSIS — E113412 Type 2 diabetes mellitus with severe nonproliferative diabetic retinopathy with macular edema, left eye: Secondary | ICD-10-CM

## 2021-01-11 DIAGNOSIS — H40053 Ocular hypertension, bilateral: Secondary | ICD-10-CM

## 2021-01-11 DIAGNOSIS — Z961 Presence of intraocular lens: Secondary | ICD-10-CM

## 2021-02-12 IMAGING — US US RENAL
1 series · 14 of 25 positions shown · non-contrast
Comparison: None available.

CLINICAL DATA: Initial evaluation for acute renal injury.

EXAM:
RENAL / URINARY TRACT ULTRASOUND COMPLETE

[Series 1: us renal · 14 of 40 slices shown]
[im 1/40]
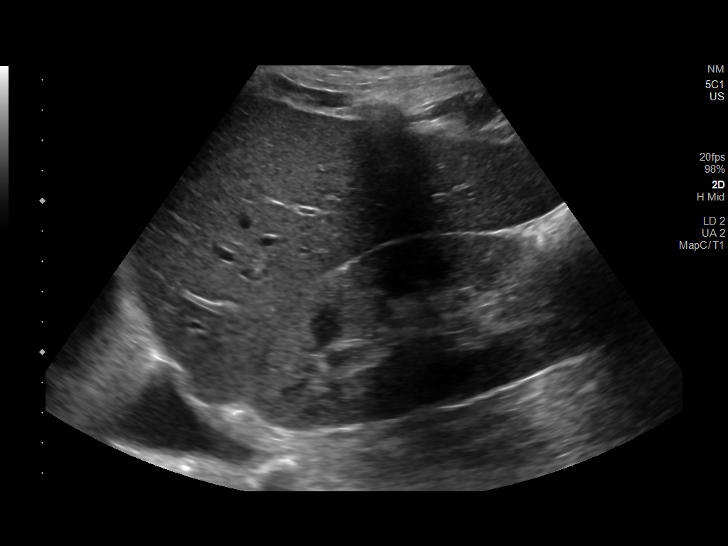
[im 4/40]
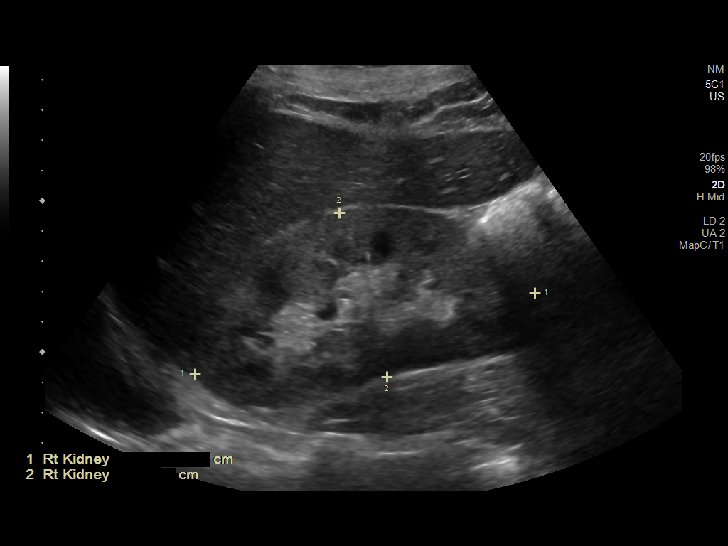
[im 7/40]
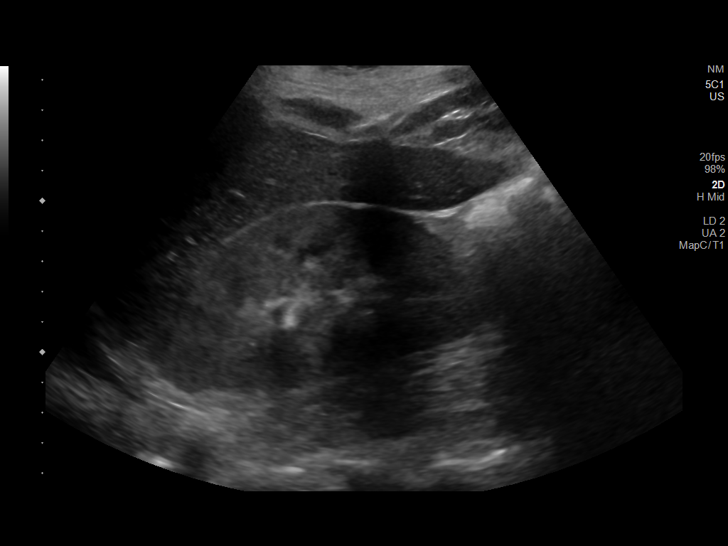
[im 10/40]
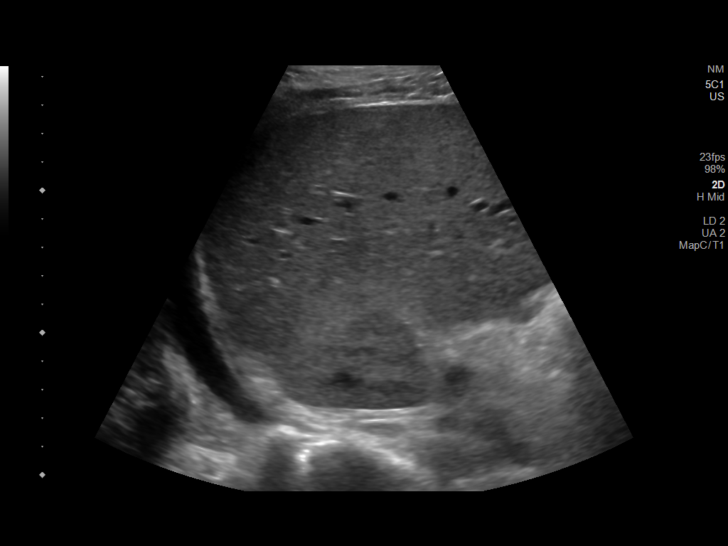
[im 14/40]
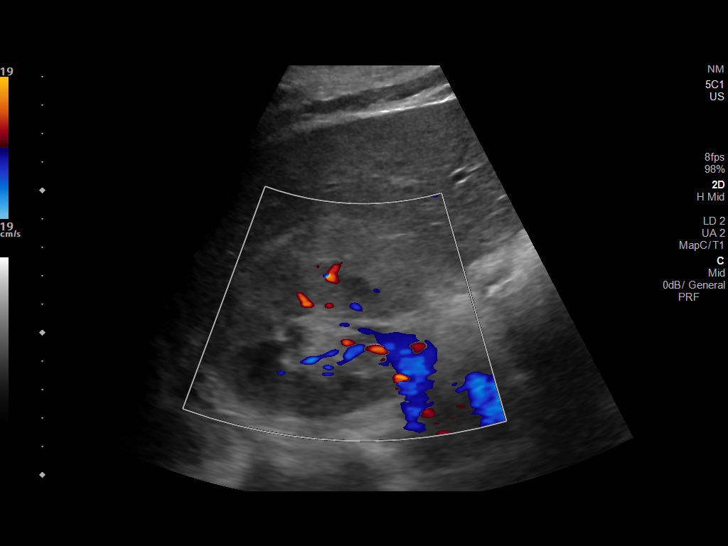
[im 15/40]
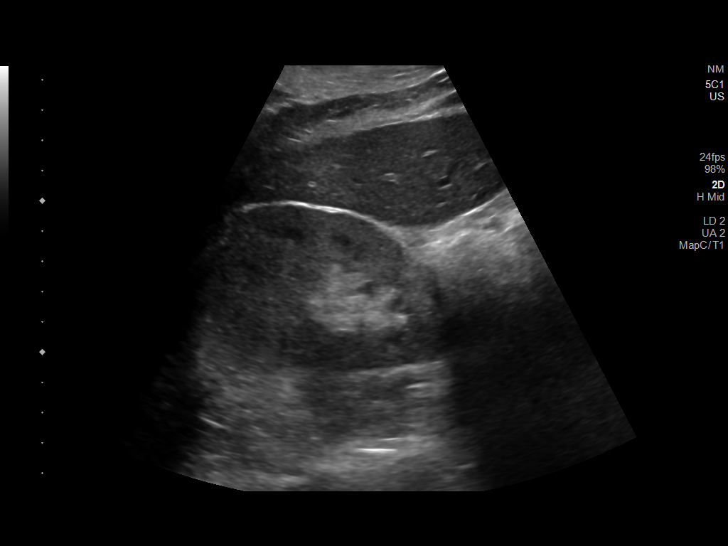
[im 18/40]
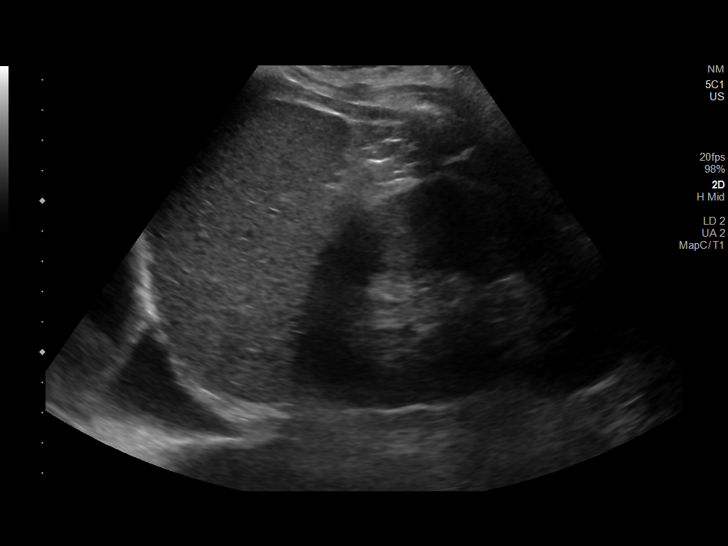
[im 22/40]
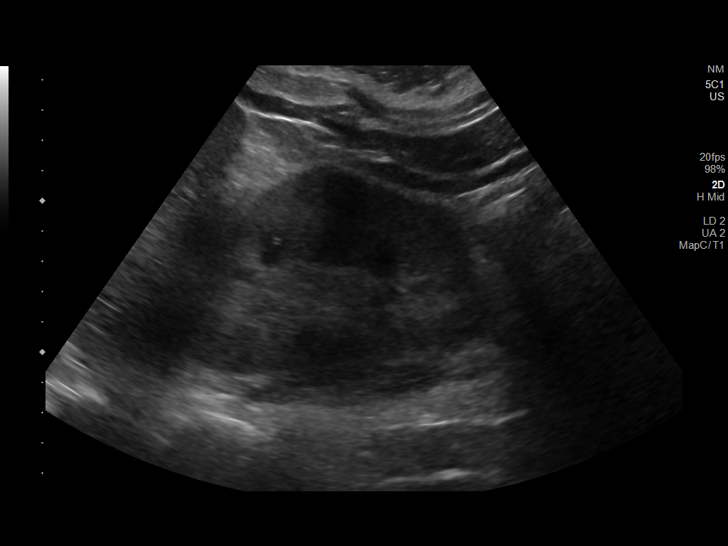
[im 25/40]
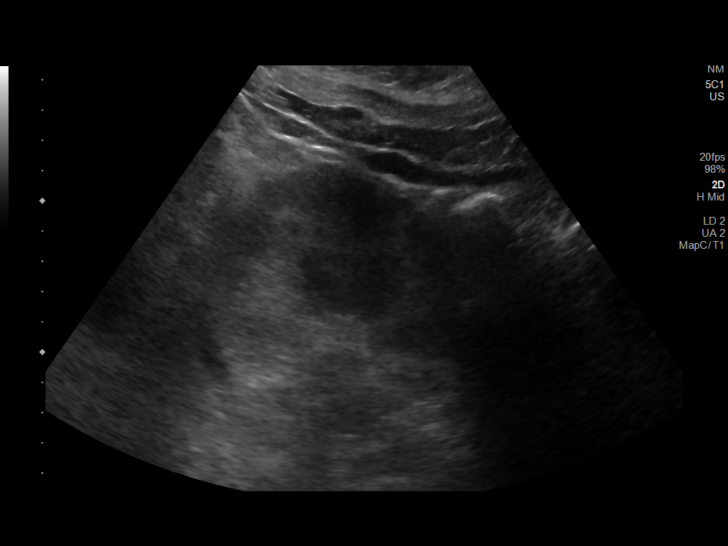
[im 27/40]
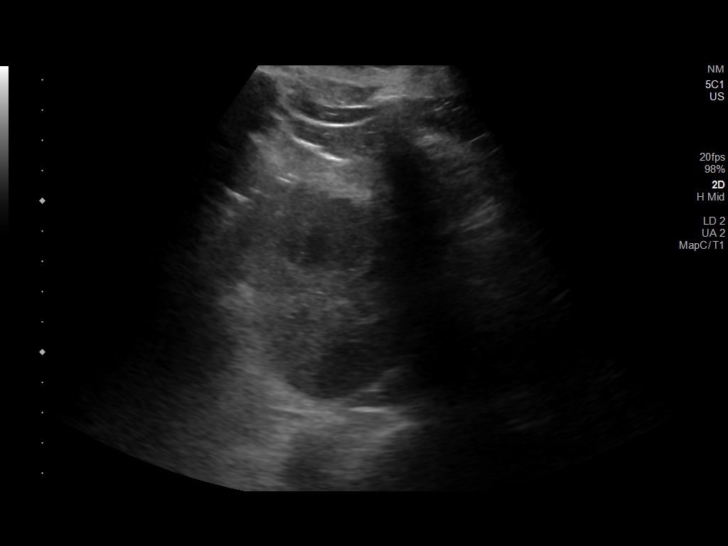
[im 30/40]
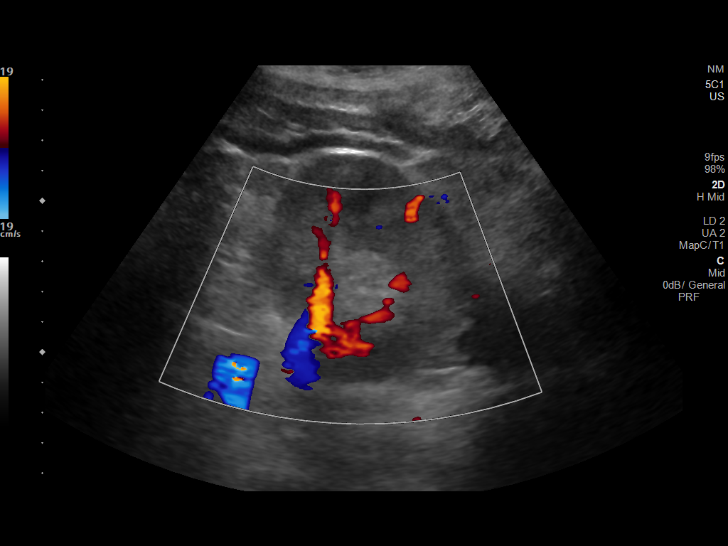
[im 33/40]
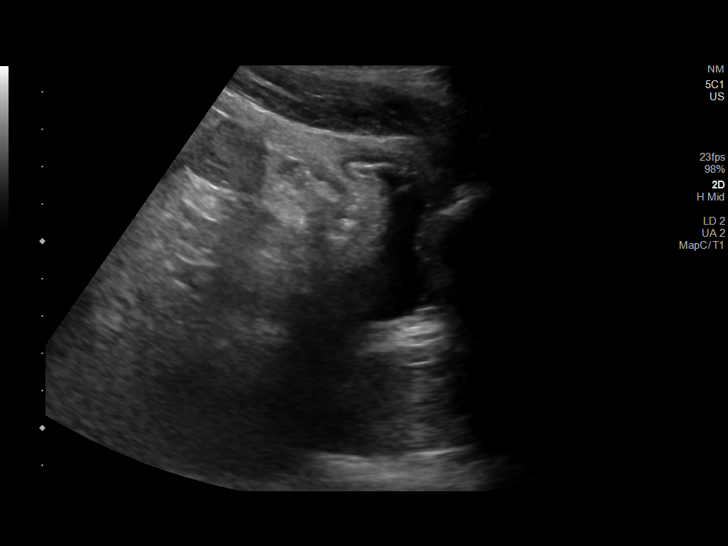
[im 36/40]
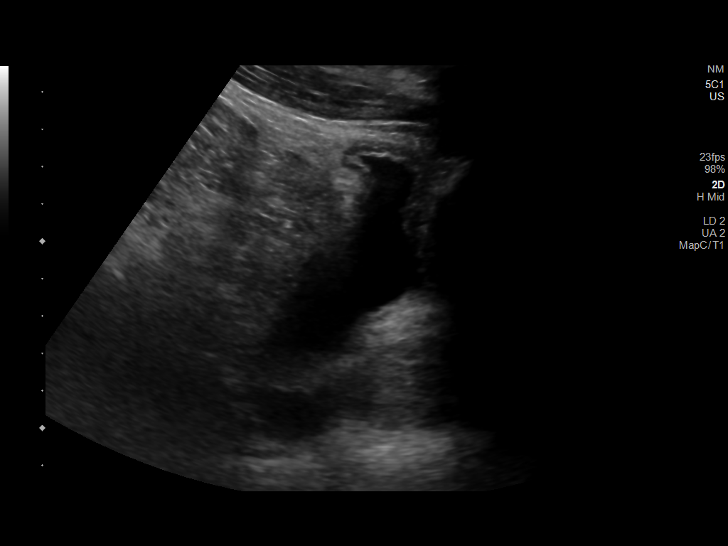
[im 40/40]
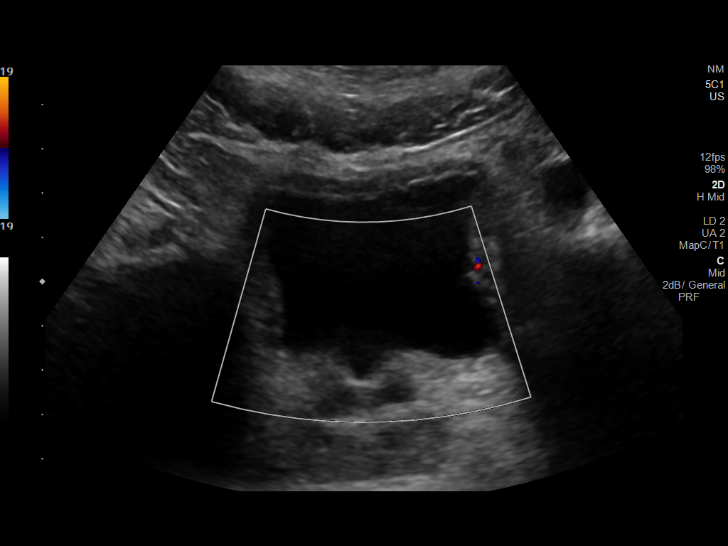

[14 of 25 positions shown; findings below may reference images not displayed]

FINDINGS: Right Kidney:

Renal measurements: 11.6 x 5.7 x 7.3 cm = volume: 247.4 mL.
Increased echogenicity seen within the renal parenchyma. No mass or
hydronephrosis visualized.

Left Kidney:

Renal measurements: 11.7 x 7.6 x 6.2 cm = volume: 286.8 mL.
Increased echogenicity seen within the renal parenchyma. No mass or
hydronephrosis visualized.

Bladder:

Appears normal for degree of bladder distention.

Bilateral pleural effusions noted.
IMPRESSION: 1. Increased echogenicity within the renal parenchyma bilaterally,
compatible with medical renal disease. No hydronephrosis.
2. Bilateral pleural effusions.

## 2021-02-16 IMAGING — US US RENAL
1 series · 13 of 25 positions shown · non-contrast
Comparison: 10/07/1998 [DATE].  Biopsy images from 10/11/2018

CLINICAL DATA: 37-year-old with renal failure and recent right
renal biopsy. Patient developed a perinephric hematoma immediately
following the renal biopsy. Decreased hemoglobin since the biopsy
and need to follow-up perinephric hematoma.

EXAM:
RENAL / URINARY TRACT ULTRASOUND COMPLETE

[Series 1: us renal · 13 of 42 slices shown]
[im 1/42]
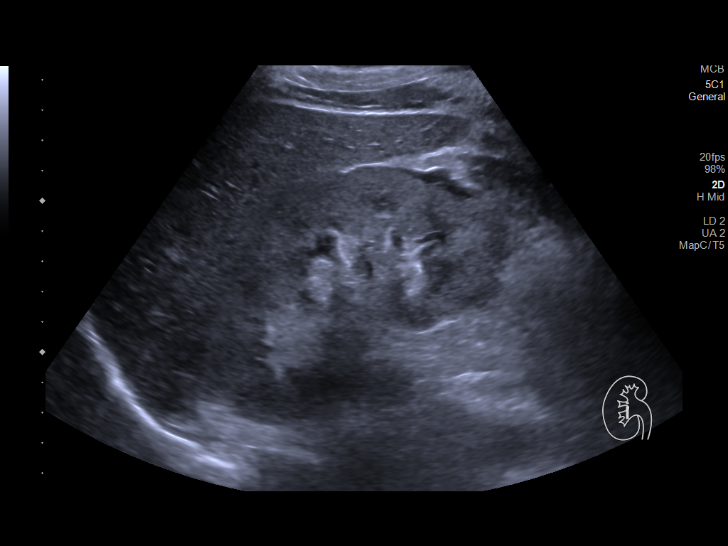
[im 4/42]
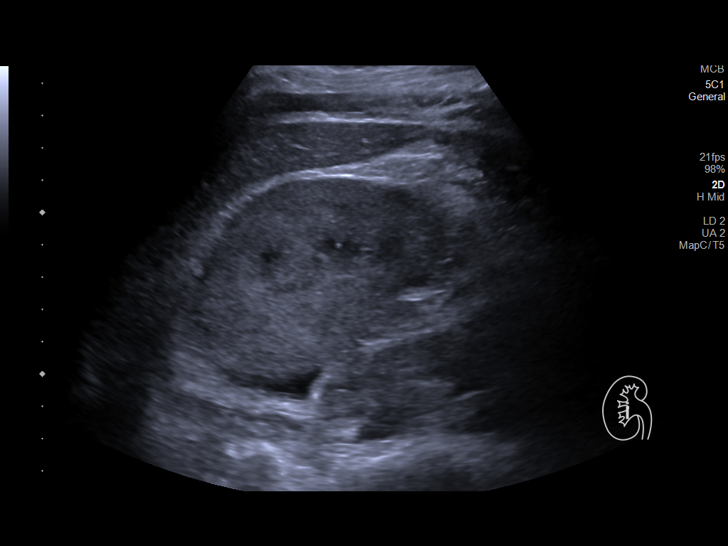
[im 7/42]
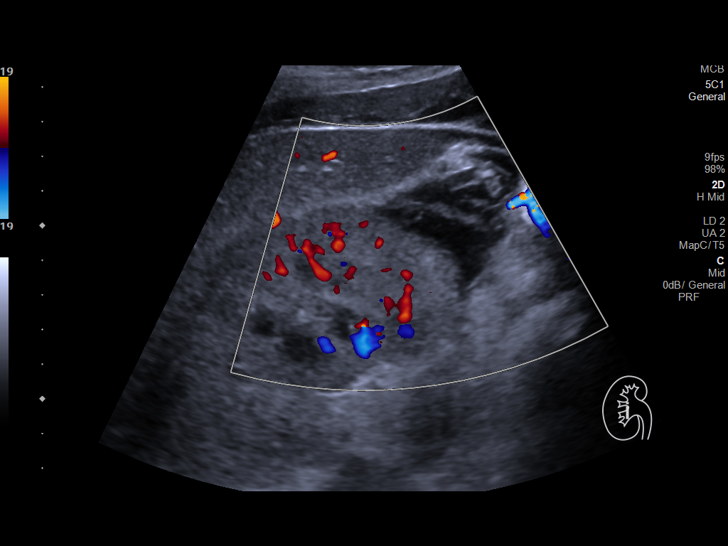
[im 11/42]
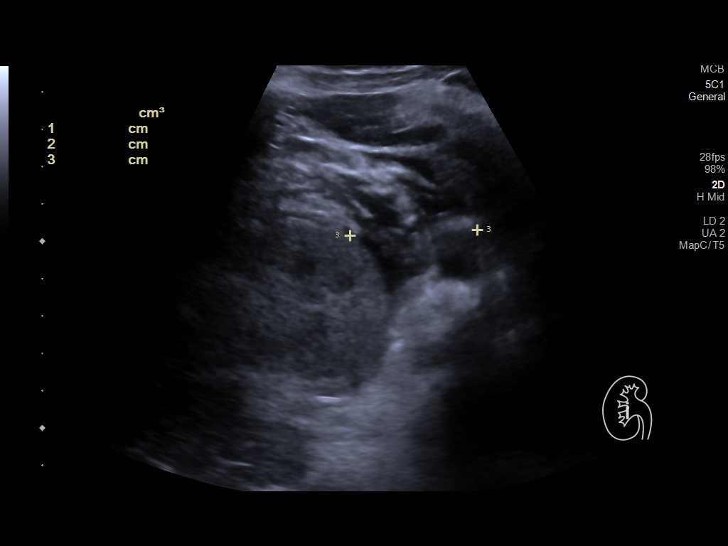
[im 14/42]
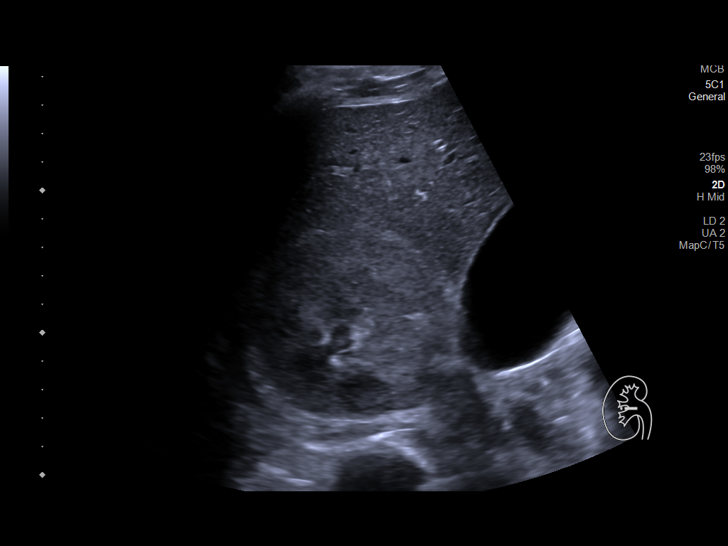
[im 18/42]
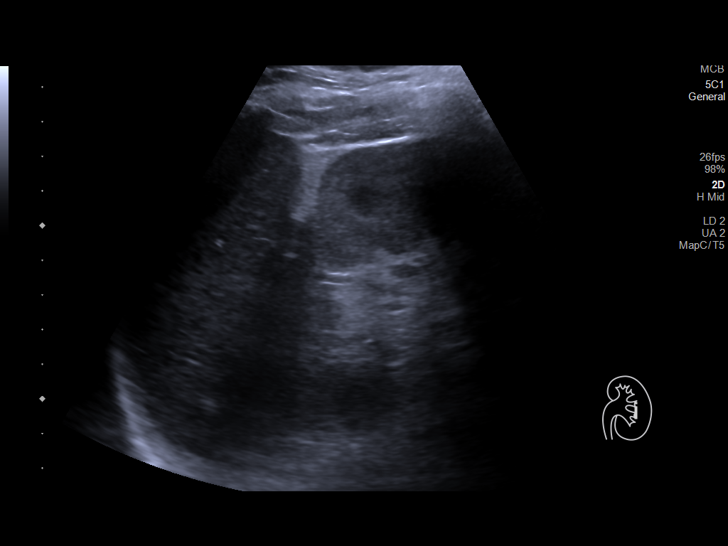
[im 21/42]
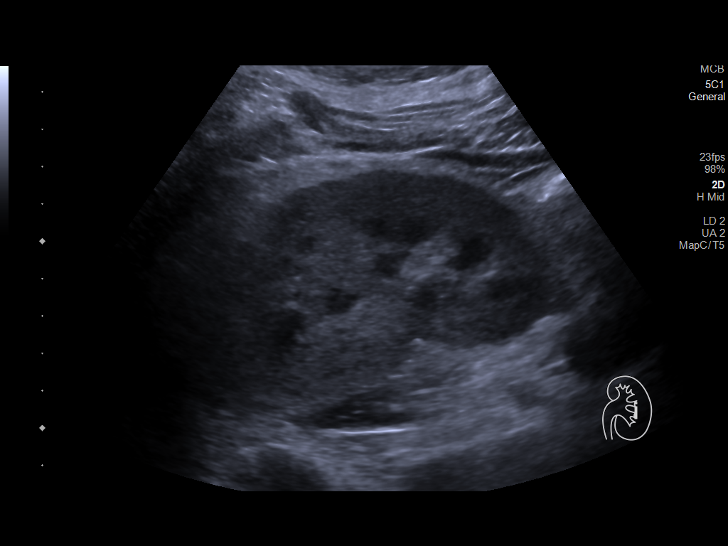
[im 24/42]
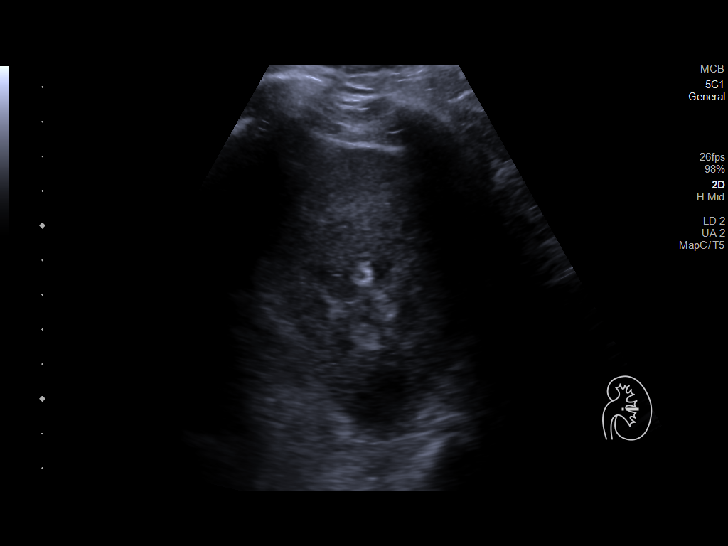
[im 28/42]
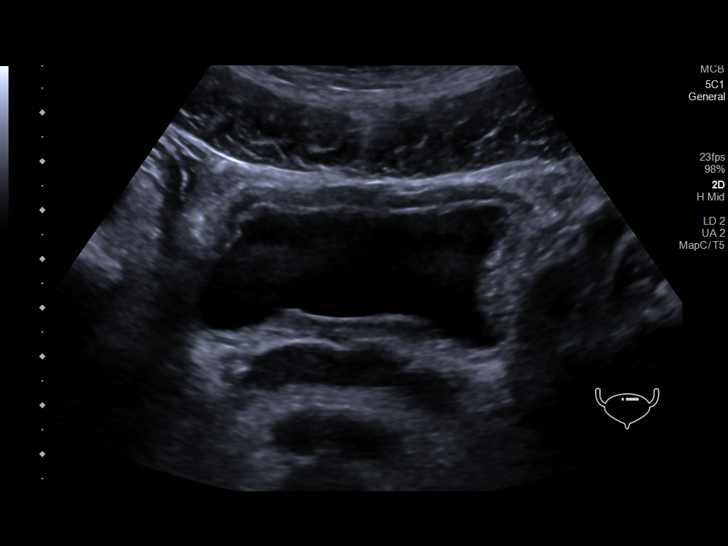
[im 31/42]
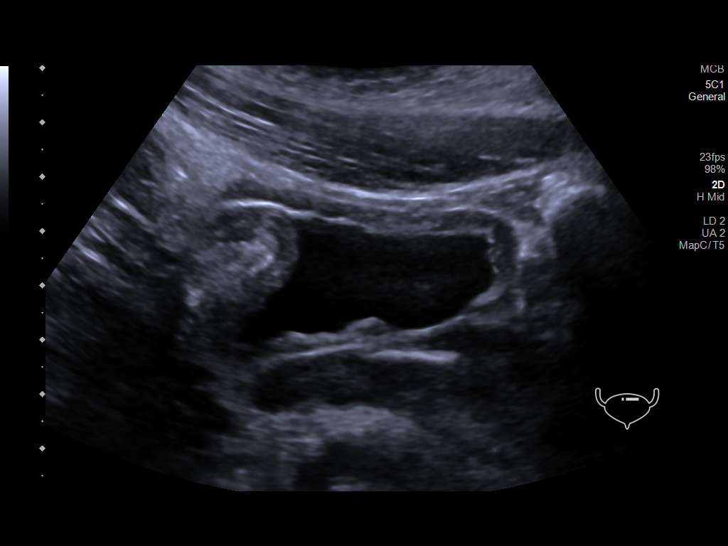
[im 35/42]
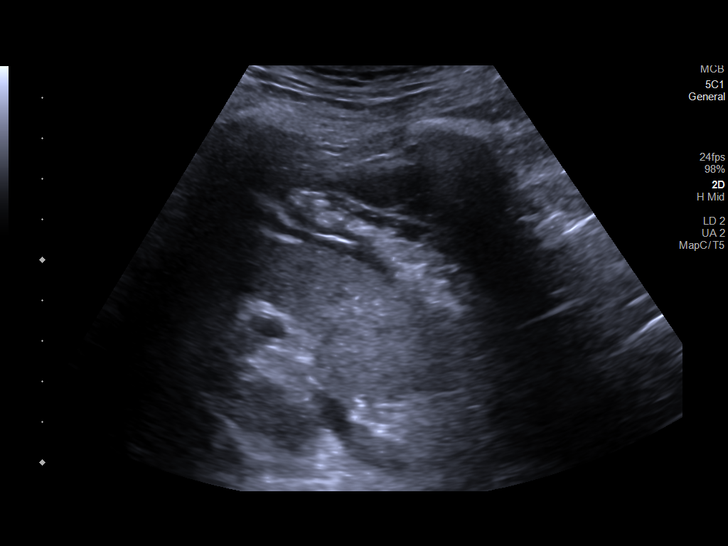
[im 38/42]
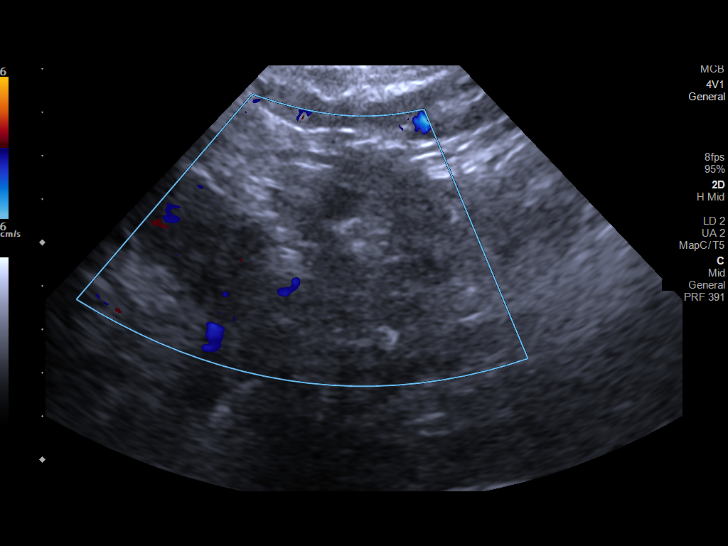
[im 42/42]
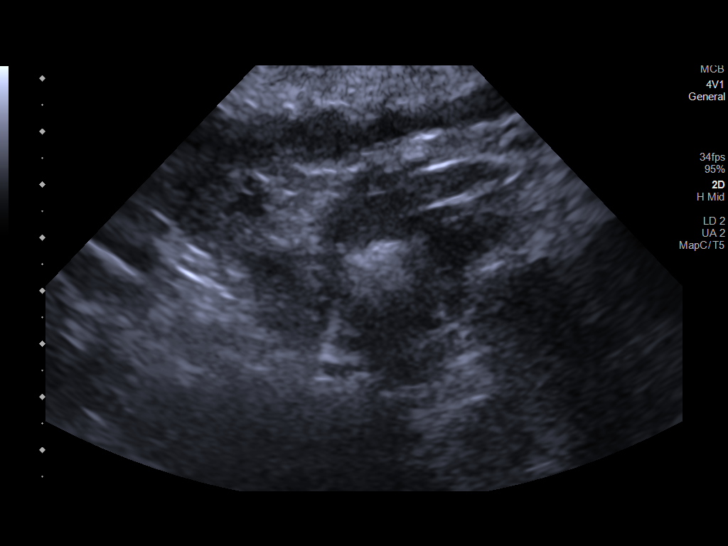

[13 of 25 positions shown; findings below may reference images not displayed]

FINDINGS: Right Kidney:

Renal measurements: 12.5 x 6.1 x 7.3 cm = volume: 295 mL. Slightly
increased echogenicity in right kidney is similar to the prior
diagnostic examination. There is now a small amount of fluid around
the kidney. No evidence for active bleeding based on the color
Doppler images. Complex collection along the inferior aspect of the
right kidney is compatible with the known hematoma. There is
echogenic material within this hematoma compatible with the recently
injected Gel-Foam. The hematoma roughly measures 4.8 x 3.6 x 4.2 cm.
Negative for right hydronephrosis. No significant compression on the
right kidney.

Left Kidney:

Renal measurements: 11.4 x 6.4 x 5.3 cm = volume: 203 mL. Slightly
increased echogenicity is similar to the previous examination.
Negative for left hydronephrosis.

Bladder:

Appears normal for degree of bladder distention. Right ureter jet is
present.
IMPRESSION: 1. Small perinephric hematoma associated with the right kidney lower
pole. Echogenic material within the hematoma is compatible with the
a Gel-Foam injected during the biopsy procedure. No evidence for
active bleeding from the right kidney lower pole.
2. Negative for hydronephrosis.
3. Stable appearance of left kidney without hydronephrosis.

## 2021-02-16 IMAGING — US ULTRASOUND CORE BIOPSY
1 series · 10 of 10 positions shown · non-contrast
Comparison: none

Addendum:
INDICATION: 37-year-old with acute kidney injury and request for random renal
biopsy.

[Series 1: ultrasound core biopsy · 10 of 10 slices shown]
[im 1/10]
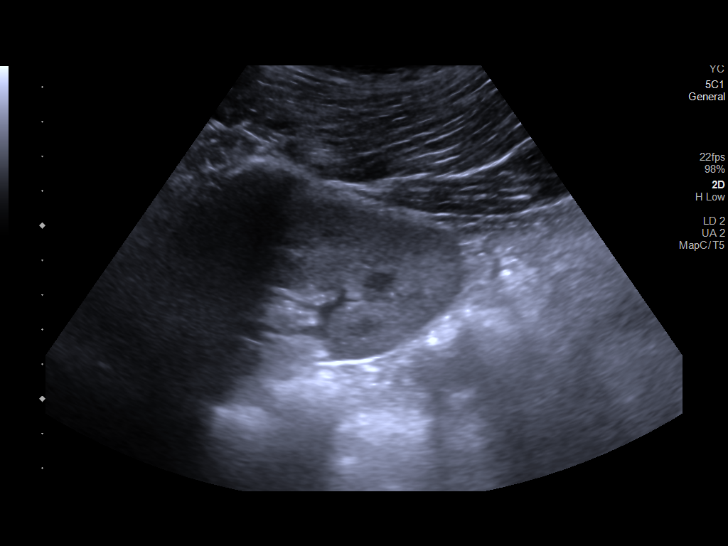
[im 2/10]
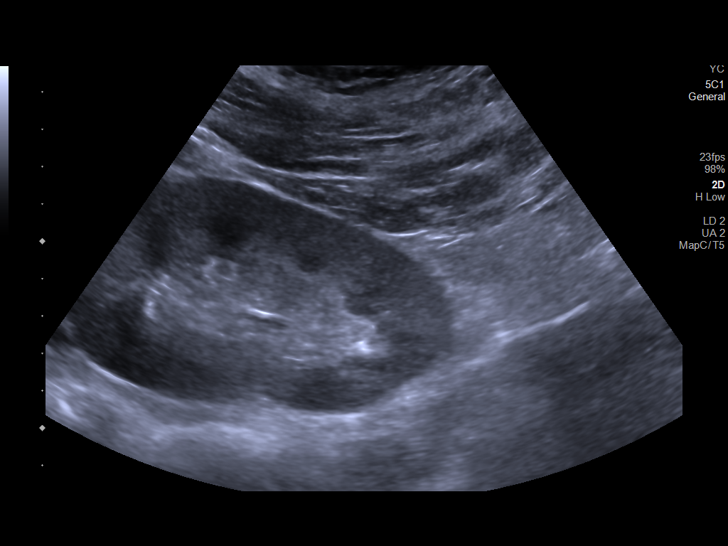
[im 3/10]
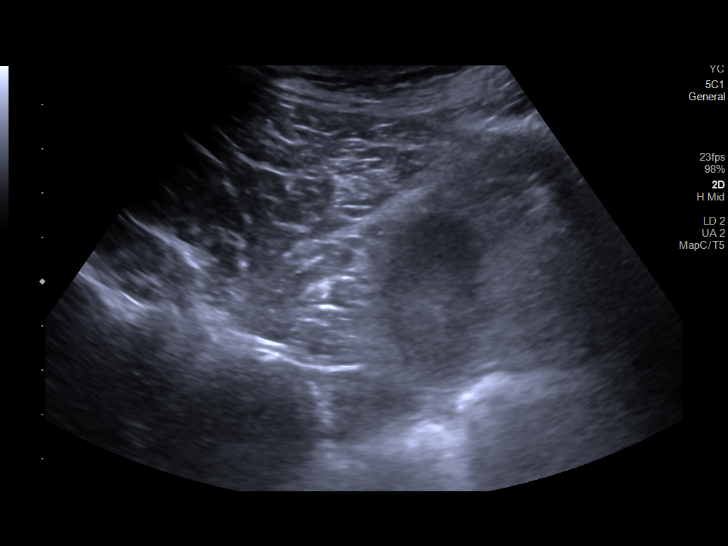
[im 4/10]
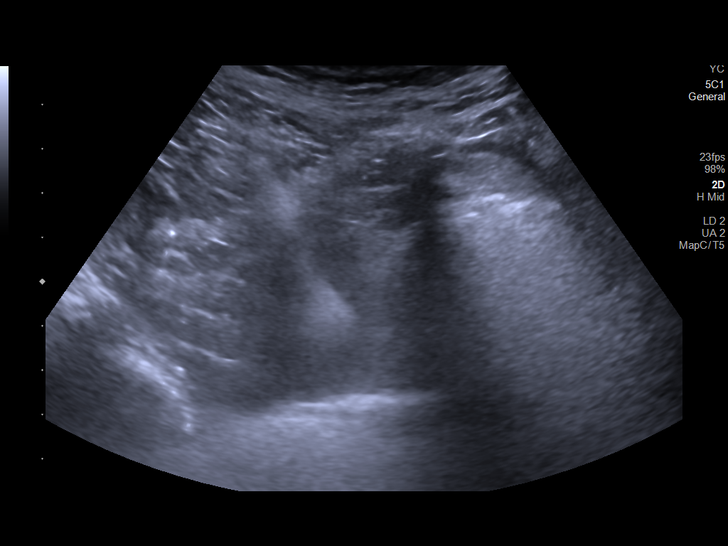
[im 5/10]
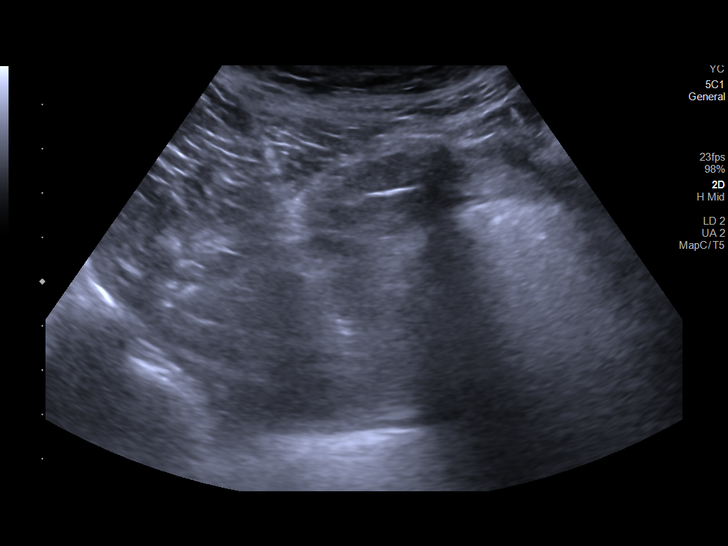
[im 6/10]
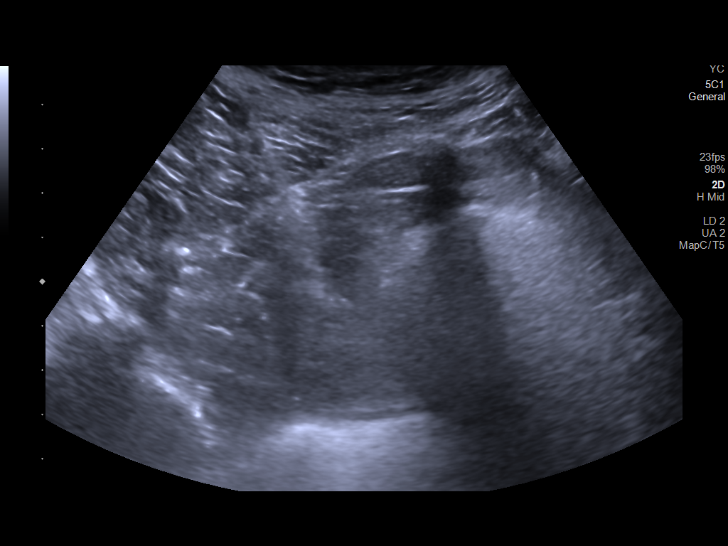
[im 7/10]
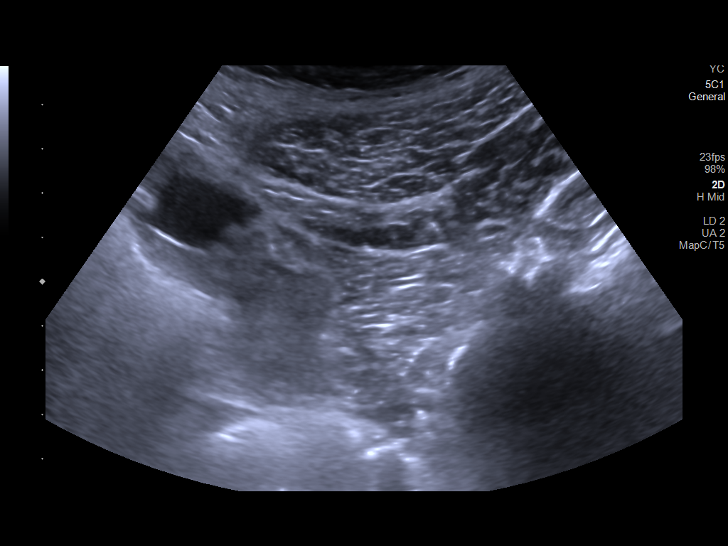
[im 8/10]
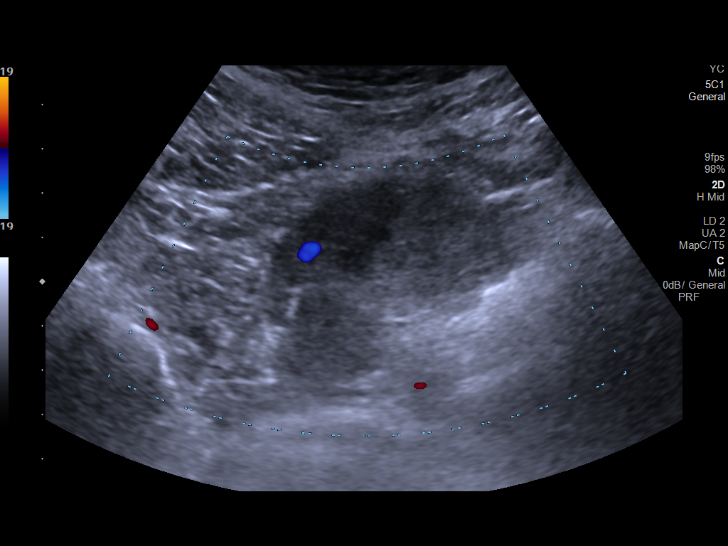
[im 9/10]
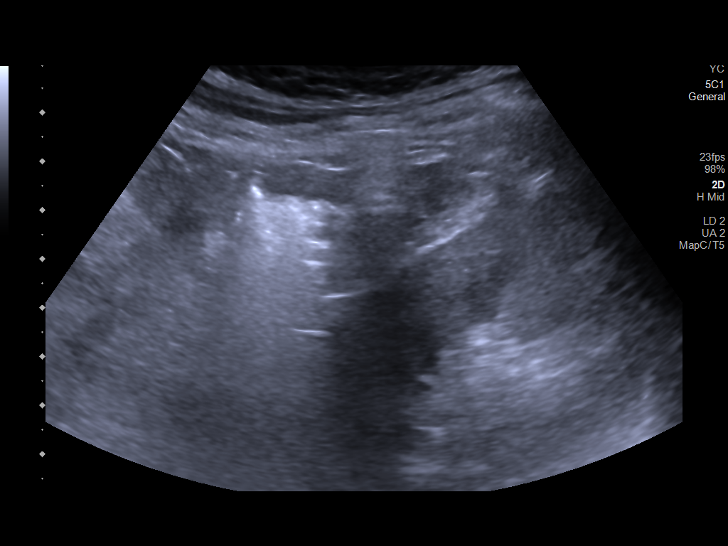
[im 10/10]
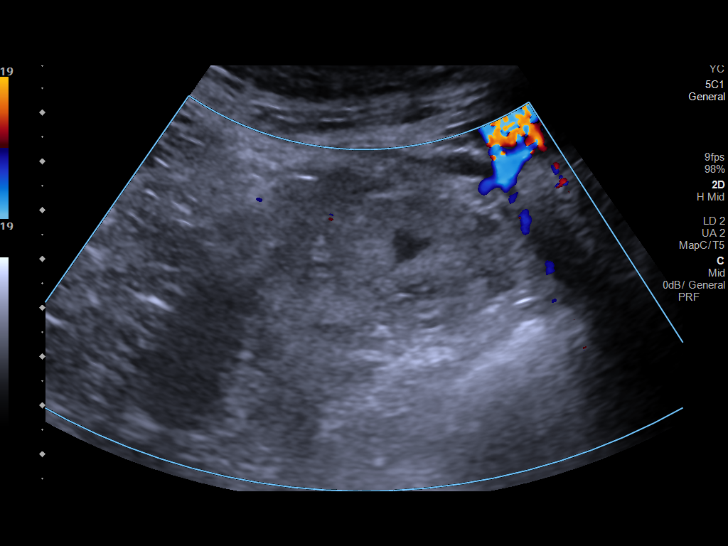

[10 of 10 positions shown; findings below may reference images not displayed]

EXAM:
ULTRASOUND-GUIDED RANDOM RENAL BIOPSY

MEDICATIONS:
None.

ANESTHESIA/SEDATION:
Fentanyl 100 mcg IV; Versed 1.5 mg IV

Moderate Sedation Time:  36 minutes

The patient was continuously monitored during the procedure by the
interventional radiology nurse under my direct supervision.

FLUOROSCOPY TIME:  None

COMPLICATIONS:
None immediate.

PROCEDURE:
Informed written consent was obtained from the patient after a
thorough discussion of the procedural risks, benefits and
alternatives. All questions were addressed. Maximal Sterile Barrier
Technique was utilized including mask, sterile gowns, sterile
gloves, sterile drape, hand hygiene and skin antiseptic. A timeout
was performed prior to the initiation of the procedure.

Patient was placed prone. Both kidneys were evaluated with
ultrasound. The right kidney was selected for biopsy. Right flank
was prepped with chlorhexidine and sterile field was created. Skin
and soft tissues were anesthetized with 1% lidocaine. Using
ultrasound guidance, 16 gauge core needle was directed into the
right kidney lower pole from a posteromedial approach. Total of 2
core biopsies were obtained and specimens were placed in saline.
Small perinephric hematoma was immediately identified following the
second core biopsy. Blood flow was identified within the perinephric
hematoma. Therefore, Gelfoam plugs and Gel-Foam slurry was injected
into this perinephric hematoma. Bandage placed over the puncture
site. Patient was very anxious after the procedure and could not
tolerate lying prone. Therefore, the patient was placed on his back
with head slightly elevated.
FINDINGS: Negative for hydronephrosis. Two core biopsies obtained from the
right kidney lower pole. Adequate specimens were obtained. Small
perinephric hematoma was immediately identified following the second
core biopsy. The hematoma slowly enlarging after the procedure.
There was blood flow within the perinephric hematoma. Therefore,
Gel-Foam was injected into the perinephric hematoma. At the end of
the procedure, there was no significant blood flow within the
perinephric hematoma but evidence for a small amount of bleeding
along the needle track within the right kidney lower pole.
IMPRESSION: 1. Ultrasound-guided core biopsy of the right kidney lower pole.
2. Procedure was complicated by a post biopsy perinephric hematoma.
Gel-Foam was injected in the perinephric hematoma. Patient was
hemodynamically stable during and after the procedure. Plan for
strict bed rest today and will get follow-up CBC. Procedure and plan
discussed with Dr. Suluaga.

ADDENDUM:
Correction to the report:

Complication: Small perinephric hematoma. Gel-Foam was injected
within the perinephric hematoma. Patient was hemodynamically stable
during and after the procedure.

SIR level B: Nominal therapy (including overnight admission for
observation), no consequence.

*** End of Addendum ***
EXAM:
ULTRASOUND-GUIDED RANDOM RENAL BIOPSY

MEDICATIONS:
None.

ANESTHESIA/SEDATION:
Fentanyl 100 mcg IV; Versed 1.5 mg IV

Moderate Sedation Time:  36 minutes

The patient was continuously monitored during the procedure by the
interventional radiology nurse under my direct supervision.

FLUOROSCOPY TIME:  None

COMPLICATIONS:
None immediate.

PROCEDURE:
Informed written consent was obtained from the patient after a
thorough discussion of the procedural risks, benefits and
alternatives. All questions were addressed. Maximal Sterile Barrier
Technique was utilized including mask, sterile gowns, sterile
gloves, sterile drape, hand hygiene and skin antiseptic. A timeout
was performed prior to the initiation of the procedure.

Patient was placed prone. Both kidneys were evaluated with
ultrasound. The right kidney was selected for biopsy. Right flank
was prepped with chlorhexidine and sterile field was created. Skin
and soft tissues were anesthetized with 1% lidocaine. Using
ultrasound guidance, 16 gauge core needle was directed into the
right kidney lower pole from a posteromedial approach. Total of 2
core biopsies were obtained and specimens were placed in saline.
Small perinephric hematoma was immediately identified following the
second core biopsy. Blood flow was identified within the perinephric
hematoma. Therefore, Gelfoam plugs and Gel-Foam slurry was injected
into this perinephric hematoma. Bandage placed over the puncture
site. Patient was very anxious after the procedure and could not
tolerate lying prone. Therefore, the patient was placed on his back
with head slightly elevated.
FINDINGS: Negative for hydronephrosis. Two core biopsies obtained from the
right kidney lower pole. Adequate specimens were obtained. Small
perinephric hematoma was immediately identified following the second
core biopsy. The hematoma slowly enlarging after the procedure.
There was blood flow within the perinephric hematoma. Therefore,
Gel-Foam was injected into the perinephric hematoma. At the end of
the procedure, there was no significant blood flow within the
perinephric hematoma but evidence for a small amount of bleeding
along the needle track within the right kidney lower pole.
IMPRESSION: 1. Ultrasound-guided core biopsy of the right kidney lower pole.
2. Procedure was complicated by a post biopsy perinephric hematoma.
Gel-Foam was injected in the perinephric hematoma. Patient was
hemodynamically stable during and after the procedure. Plan for
strict bed rest today and will get follow-up CBC. Procedure and plan
discussed with Dr. Suluaga.

## 2021-02-17 IMAGING — US US RENAL
1 series · 14 of 25 positions shown · non-contrast
Comparison: 10/11/2018

CLINICAL DATA: Hematoma right kidney after biopsy yesterday

EXAM:
RENAL / URINARY TRACT ULTRASOUND COMPLETE

[Series 1: us renal · 38 acquisitions, 14 frames shown]
[im 1/38]
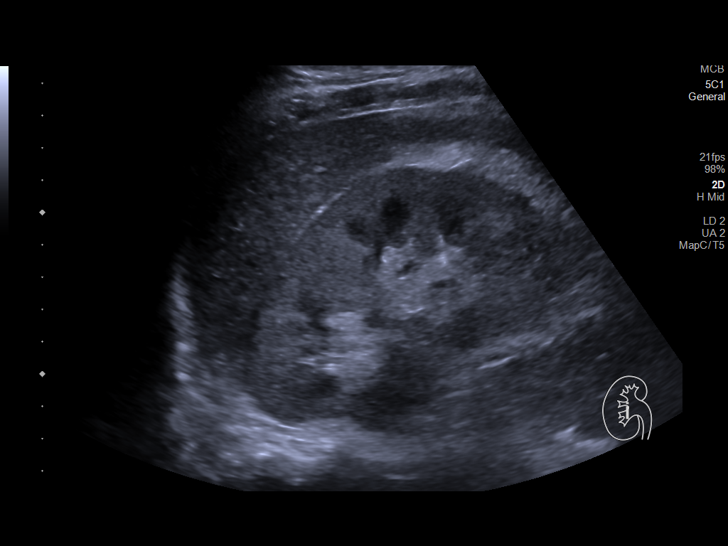
[im 4/38]
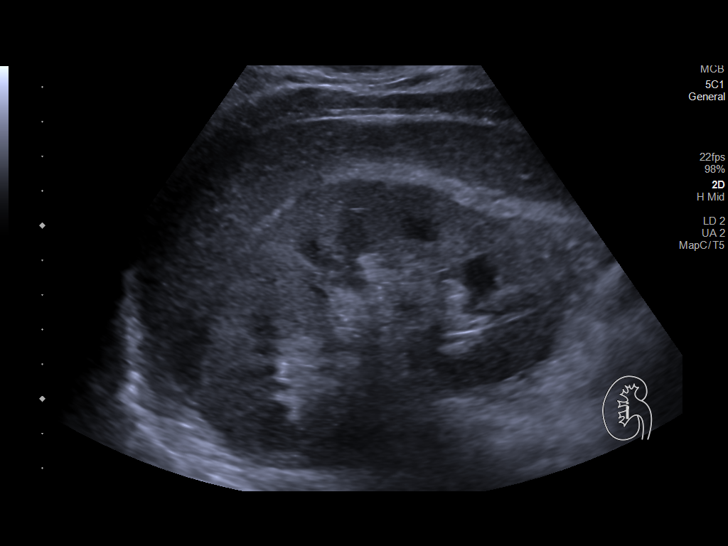
[im 7/38]
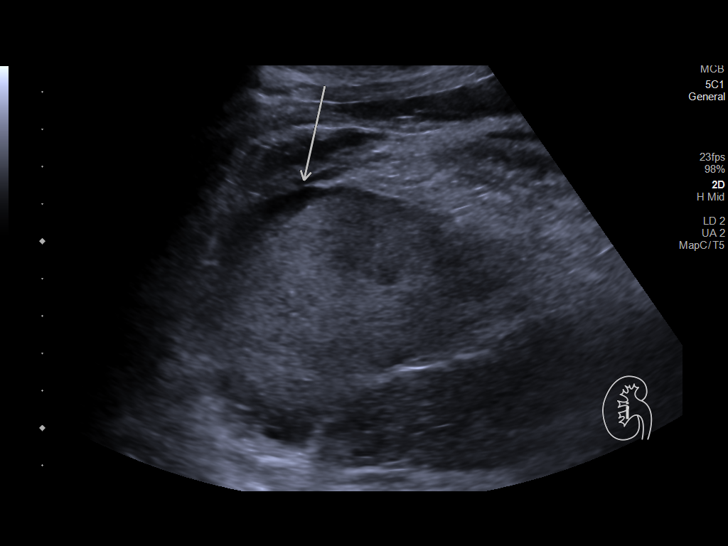
[im 10/38]
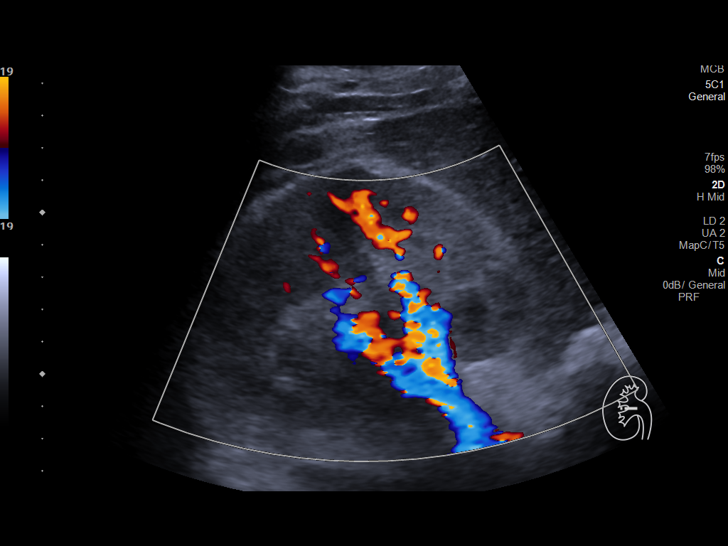
[im 13/38]
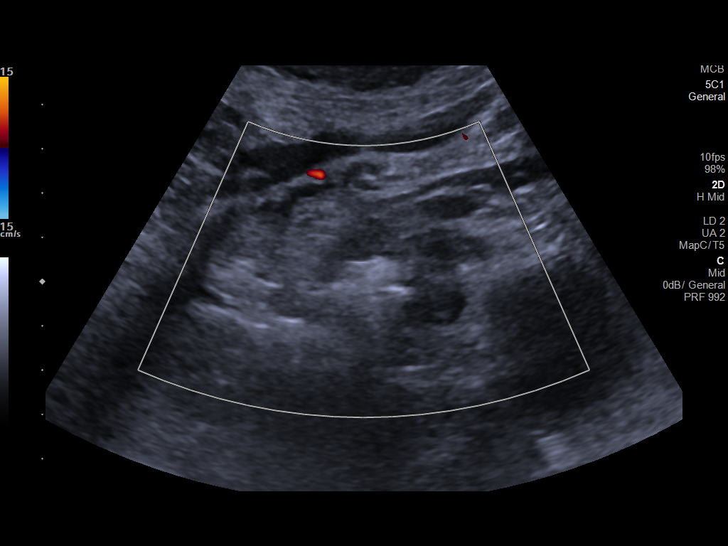
[im 14/38]
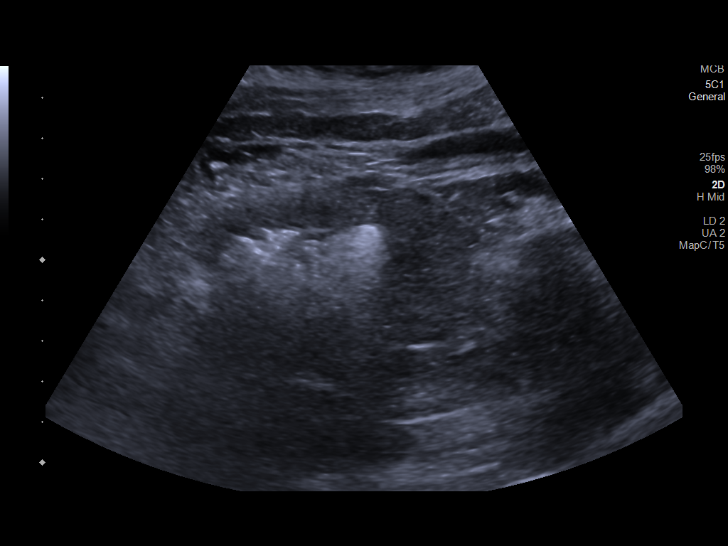
[im 17/38]
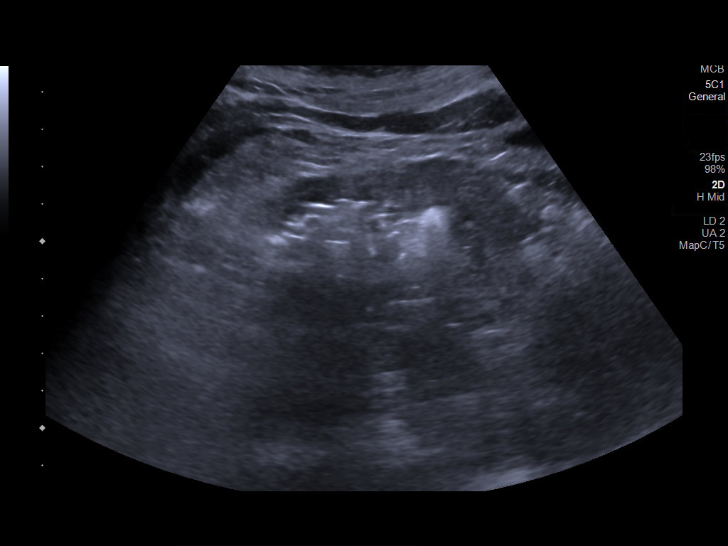
[im 21/38]
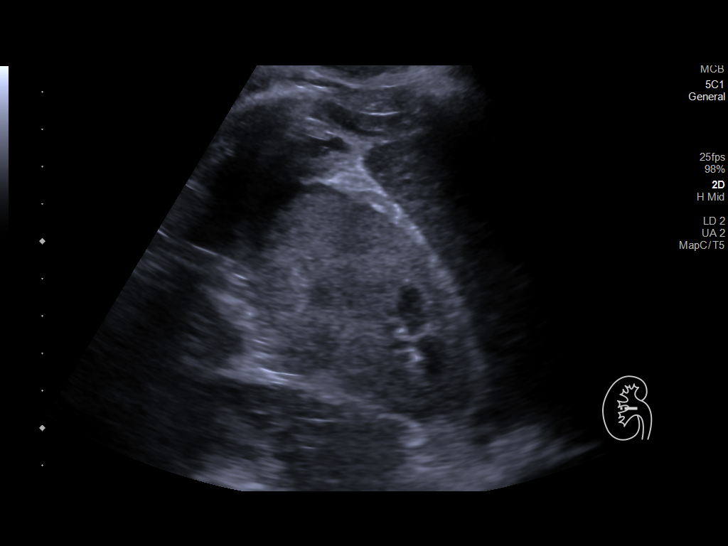
[im 24/38]
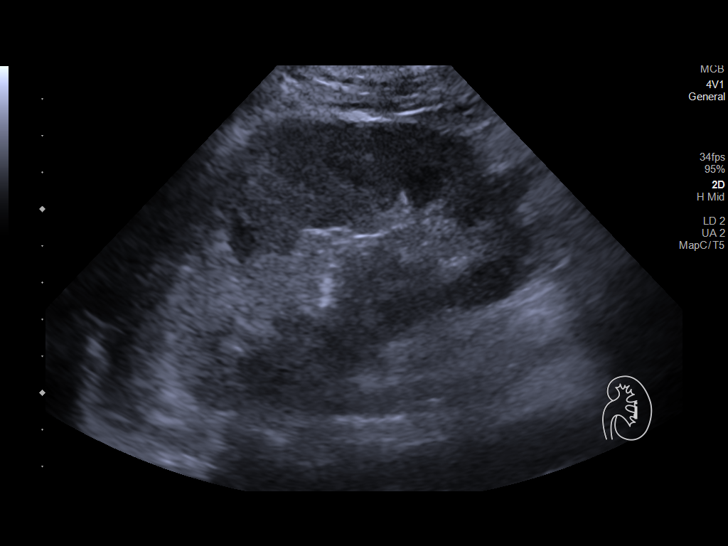
[im 25/38]
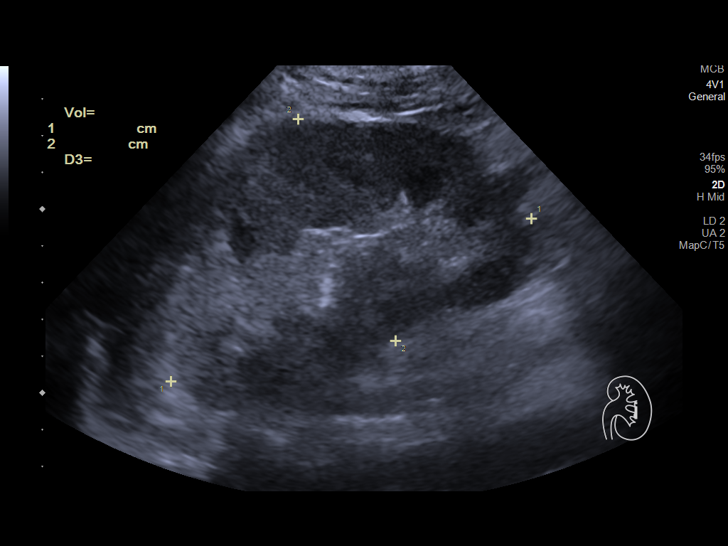
[im 28/38]
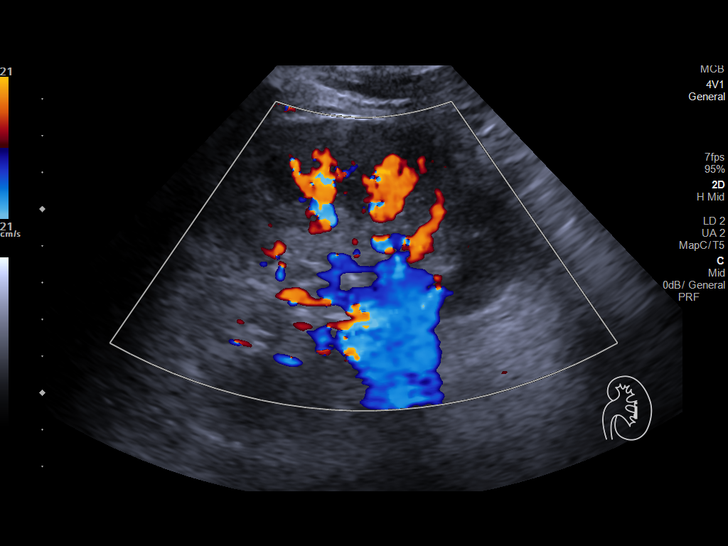
[im 31/38]
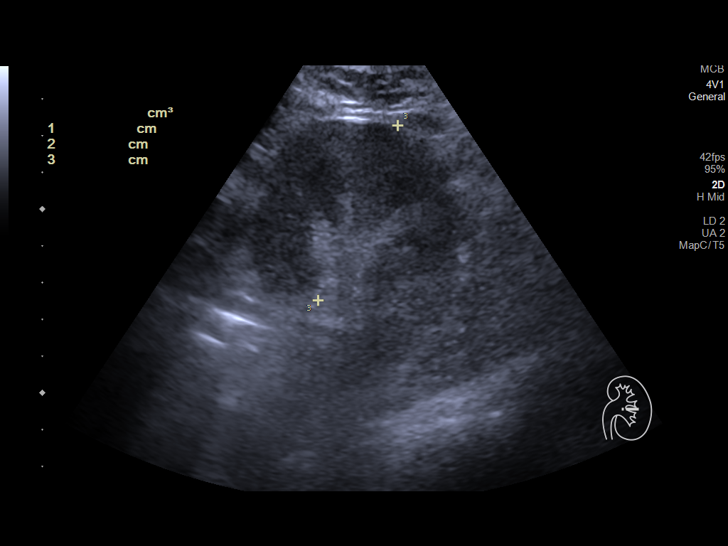
[im 34/38]
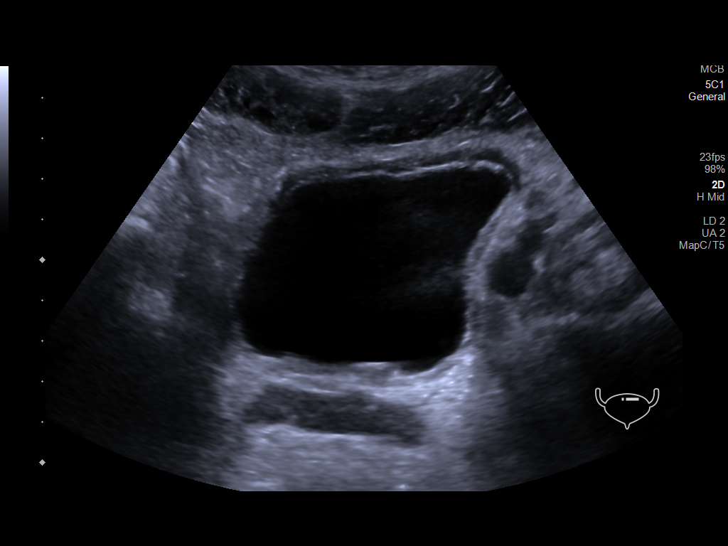
[im 38/38]
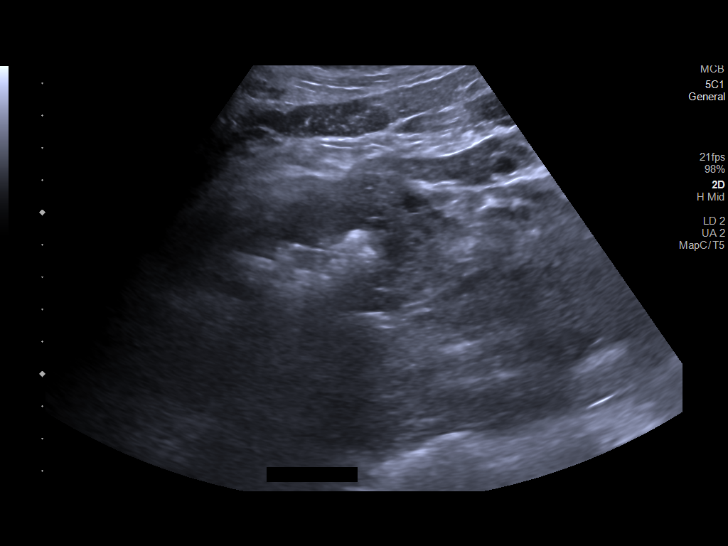

[14 of 25 positions shown; findings below may reference images not displayed]

FINDINGS: Bilateral echogenic renal cortex consistent with medical renal
disease. Heterogeneous material about the lower pole right kidney
measuring 7.8 x 3.8 x 3.6 cm. There is been no convincing change
from prior when allowing for irregular shape and inconsistency of
ultrasound planes. There may has also been change in the clot
character. There is internal echogenic material with ring down
artifact, likely air or Gelfoam. No hydronephrosis.
IMPRESSION: 1. No suspected change in the right perinephric hematoma. No new
abnormality.
2. Medical renal disease.

## 2021-02-22 NOTE — Progress Notes (Signed)
[facility] Note  02/23/2021     CHIEF COMPLAINT Patient presents for Retina Follow Up   HISTORY OF PRESENT ILLNESS: Bill [name] is a 40 y.o. male who presents to the clinic today for:  HPI     Retina Follow Up   Patient presents with  Diabetic Retinopathy.  In both eyes.  Duration of 14 weeks.  Since onset it is stable.  I, the attending physician,  performed the HPI with the patient and updated documentation appropriately.        Comments   14 week follow up PDR OU- Vision seems about the same OU.  BS 144 this morning, 160 now in office A1C 7      Last edited by [name], MD on 02/23/2021  9:38 AM.    Pt states vision is stable, his blood sugar is under better control since his kidney transplant   Referring physician: Arlyss Queen, MD [place],  [place] [id number]  HISTORICAL INFORMATION:   Selected notes from the MEDICAL RECORD NUMBER Referred by Dr. [name] for concern of severe NPDR / PDR OU   CURRENT MEDICATIONS: No current outpatient medications on file. (Ophthalmic Drugs)   No current facility-administered medications for this visit. (Ophthalmic Drugs)   Current Outpatient Medications (Other)  Medication Sig   atorvastatin (LIPITOR) 10 MG tablet Take 1 tablet (10 mg total) by mouth daily.   AURYXIA 1 GM 210 MG(Fe) tablet Take 210 mg by mouth 3 (three) times daily with meals.   Dulaglutide (TRULICITY) [id number] [id number] SOPN Inject 0.75 mg into the skin once a week.   ferric citrate (AURYXIA) 1 GM 210 MG(Fe) tablet Take 210 mg by mouth as directed.   fluticasone (FLONASE) 50 MCG/ACT nasal spray Place 1 spray into both nostrils daily as needed for allergies or rhinitis.   Methoxy PEG-Epoetin Beta (MIRCERA IJ) Mircera   multivitamin (RENA-VIT) TABS tablet Take 1 tablet by mouth daily.   sertraline (ZOLOFT) 50 MG tablet TAKE 1 TABLET(50 MG) BY MOUTH DAILY   aspirin 81 MG EC tablet Take 81 mg by  mouth as needed. (Patient not taking: Reported on 02/23/2021)   No current facility-administered medications for this visit. (Other)      REVIEW OF SYSTEMS: ROS   Positive for: Neurological, Genitourinary, Endocrine, Cardiovascular, Eyes, Psychiatric Negative for: Constitutional, Gastrointestinal, Skin, Musculoskeletal, HENT, Respiratory, Allergic/Imm, Heme/Lymph Last edited by Leonie Douglas, COA on 02/23/2021  8:53 AM.        ALLERGIES Allergies  Allergen Reactions   Food     Melons-itchy/headaches    PAST MEDICAL HISTORY Past Medical History:  Diagnosis Date   Anemia    Anxiety    situational   Diabetes mellitus without complication ([place])    Diabetic retinopathy ([place])    PDR OU   ESRD on dialysis ([place])    Dialyzing T-Th-S   History of blood transfusion    Hypertension    PONV (postoperative nausea and vomiting)    03/22/2019- only 15 years ago   Renal disorder    Stroke [facility]) 2019   vision   Past Surgical History:  Procedure Laterality Date   A/V FISTULAGRAM N/A 03/11/2019   Procedure: A/V FISTULAGRAM - Left Arm;  Surgeon: Waynetta Sandy, MD;  Location: [facility];  Service: Cardiovascular;  Laterality: N/A;   arm surgery Right    Fracture   AV FISTULA PLACEMENT Left 01/25/2019   Procedure: ARTERIOVENOUS (AV) FISTULA  CREATION  LEFT UPPER  ARM;  Surgeon: Serafina Mitchell, MD;  Location: [facility];  Service: Vascular;  Laterality: Left;   Roe Left 03/27/2019   Procedure: SECOND STAGE BASILIC VEIN TRANSPOSITION LEFT ARM;  Surgeon: Serafina Mitchell, MD;  Location: [facility] OR;  Service: Vascular;  Laterality: Left;   KIDNEY TRANSPLANT  11/05/2020   [facility]   WISDOM TOOTH EXTRACTION      FAMILY HISTORY Family History  Problem Relation Age of Onset   Diabetes Mellitus II Mother    Hypertension Mother    Cataracts Mother    Diabetes Mellitus II Father    Stroke Father    Hypertension Father    Glaucoma Maternal  Grandmother    Amblyopia Neg Hx    Blindness Neg Hx    Macular degeneration Neg Hx    Retinal detachment Neg Hx    Strabismus Neg Hx    Retinitis pigmentosa Neg Hx     SOCIAL HISTORY Social History   Tobacco Use   Smoking status: Never   Smokeless tobacco: Never  Vaping Use   Vaping Use: Never used  Substance Use Topics   Alcohol use: Not Currently   Drug use: Not Currently         OPHTHALMIC EXAM:  Base Eye Exam     Visual Acuity (Snellen - Linear)       Right Left   Dist [place] 20/70 +1 20/70 +2   Dist ph [place] 20/50 +2 20/60 -2  OS- looking to the left        Tonometry (Tonopen, 9:02 AM)       Right Left   Pressure 15 16         Pupils       Dark Light Shape React APD   Right 4 3 Round Brisk None   Left 4 3 Round Brisk None         Visual Fields (Counting fingers)       Left Right    Full Full         Extraocular Movement       Right Left    Full Full         Neuro/Psych     Oriented x3: Yes   Mood/Affect: Normal         Dilation     Both eyes: 1.0% Mydriacyl, 2.5% Phenylephrine @ 9:02 AM           Slit Lamp and Fundus Exam     Slit Lamp Exam       Right Left   Lids/Lashes Normal Normal   Conjunctiva/Sclera White and quiet White and quiet   Cornea Trace Punctate epithelial erosions Trace Punctate epithelial erosions   Anterior Chamber Deep and quiet Deep and quiet   Iris Round and dilated, No NVI Round and dilated, no NVI   Lens 1+ Nuclear sclerosis, 1-2+ Cortical cataract, 2+Posterior subcapsular cataract PC IOL in good position   Vitreous VH improved, scattered fibrosis superior to disc, white blood stained vitreous condensations -- slightly improving, old, white blood clots settled inferiorly Syneresis, white blood stained vitreous condensations settling inferiorly and improving         Fundus Exam       Right Left   Disc 2-3+Pallor, sharp rim, fibrosis 2-3+Pallor, Sharp rim, Compact, +fibrosis, NVD, no heme    C/D Ratio 0.1 0.2   Macula Blunted foveal reflex, +fibrosis, +exudates - improved, IRH/MA (improving), ERM with macular  pseudo hole like appearance Flat, blunted foveal reflex, atrophic, flat choroidal nevus superior macula   Vessels Severe attenuation, Tortuous, +fibrosis, +NV inferior to disc -- regressing Sclerotic arterioles, Tortuous, +NV -- regressing, fibrosis along superior arcades, severe Vascular attenuation   Periphery Attached, 360 PRP w/ good posterior fill in, tractional fibrosis superior and along arcades, greatest above disc Attached; 360 PRP in place, good posterior fill in, scattered tractional fibrosis           IMAGING AND PROCEDURES  Imaging and Procedures for '@TODAY'$ @  OCT, Retina - OU - Both Eyes       Right Eye Quality was good. Central Foveal Thickness: 337. Progression has been stable. Findings include abnormal foveal contour, intraretinal hyper-reflective material, preretinal fibrosis, vitreous traction, epiretinal membrane, no SRF, macular pucker, inner retinal atrophy, no IRF (Persistent PRF and ERM).   Left Eye Quality was good. Central Foveal Thickness: 221. Progression has been stable. Findings include abnormal foveal contour, intraretinal hyper-reflective material, no SRF, inner retinal atrophy, epiretinal membrane, preretinal fibrosis, outer retinal atrophy, vitreomacular adhesion , no IRF, vitreous traction (persistent retinal atrophy, tractional edema nasal to disc).   Notes *Images captured and stored on drive  Diagnosis / Impression:  PDR w/ tractional fibrosis OU OD: Persistent PRF and ERM OS: persistent retinal atrophy, tractional edema nasal to disc  Clinical management:  See below  Abbreviations: NFP - Normal foveal profile. CME - cystoid macular edema. PED - pigment epithelial detachment. IRF - intraretinal fluid. SRF - subretinal fluid. EZ - ellipsoid zone. ERM - epiretinal membrane. ORA - outer retinal atrophy. ORT - outer retinal  tubulation. SRHM - subretinal hyper-reflective material       Fluorescein Angiography Optos (Transit OD)       Right Eye Progression has improved. Early phase findings include staining, vascular perfusion defect. Mid/Late phase findings include staining, vascular perfusion defect, leakage (Mild perivascular leakage; improved NV).   Left Eye Progression has improved. Early phase findings include vascular perfusion defect, retinal neovascularization, staining. Mid/Late phase findings include retinal neovascularization, vascular perfusion defect, staining, leakage (Persistent NVE nasal to disc--improved leakage from Oct 2021 study).   Notes **Images stored on drive**  Impression: PDR OU Large patches of vascular non-perfusion OU (OS>OD) +NV and perivascular leakage improved OU Late, leaking MA OU            ASSESSMENT/PLAN:   ICD-10-CM   1. Proliferative diabetic retinopathy of both eyes with macular edema associated with type 2 diabetes mellitus (HCC)  [id number] Fluorescein Angiography Optos (Transit OD)    CANCELED: Intravitreal Injection, Pharmacologic Agent - OD - Right Eye    CANCELED: Intravitreal Injection, Pharmacologic Agent - OS - Left Eye    2. Vitreous hemorrhage of both eyes ([place])  H43.13     3. Retinal edema  H35.81 OCT, Retina - OU - Both Eyes    4. Essential hypertension  I10     5. Hypertensive retinopathy of both eyes  H35.033 Fluorescein Angiography Optos (Transit OD)    6. Acute CVA (cerebrovascular accident) ([place])  I63.9     7. Ocular hypertension, bilateral  H40.053     8. Combined forms of age-related cataract of right eye  H25.811     9. Pseudophakia  Z96.1      1-3. Proliferative diabetic retinopathy OU  - delayed follow up from 8 weeks to 14 weeks -- was in [place] for the summer  - last A1c was 7,8 on 7.202.22; 7.2 on 01.13.22  - s/p PRP  OD (10.02.19), (01.22.20), (02.12.20), (03.08.22)  - s/p PRP OS (05/02/2018), (02.19.20), (2.17.22)  -  s/p IVK #1 OD (01.22.20), OS (02.12.20)  - s/p IVA OD #1 (10.21.20), #2 (12.02.20), #3 (01.13.21), #4 (02.10.21), #5 (04.19.21), #6 (05.21.21), #7 (06.21.21), #8 (08.19.21), #9 (10.11.21), #10 (12.01.21), #11 (01.25.22), #12 (03.01.22), #13 (04.05.22), #14 (6.6.22)  - s/p IVA OS #1 (12.02.20), #2 (04.19.21), #3 (05.21.21), #4 (06.21.21), #5 (08.19.21), #6 (10.11.21), #7 (12.01.21), #8 (01.25.22), #9 (03.01.22), #10 (04.05.22), #11 (6.6.22)  - FA 8.19.19 shows leaking MA, patches of capillary nonperfusion OU  - repeat FA 10.11.21 shows persistent NV OU, large patches of vascular nonperfusion OU (OS>OD) -- fill in PRP completed OU (OS  2.17.22, OD 3.8.22)  - repeat FA 9.13.22 shows interval improvement in NV  - BCVA OD: 20/50, OS: 20/60 (stable OU)  - exam shows interval decrease in VH, preretinal and subhyaloid hemorrhage, persistent preretinal fibrosis OU  - OCT shows OD: Persistent PRF and ERM; OS: persistent retinal atrophy, tractional edema nasal to disc  - VH precautions reviewed -- minimize activities, keep head elevated, avoid ASA/NSAIDs/blood thinners as able  - pt on heparin  - discussed possibility of surgery being needed in the future to stabilize eye and prevent further progression of diabetic eye disease  - recommend IVA OU (OD #15 and OS #12) today, 09.13.22 w/ return again in 12 wks  - pt wants to come back next week for injections due to not having a driver today  - [facility] informed consent form signed and scanned on 01.13.2021  - f/u next week, DFE, OCT  4,5. 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 [name] in mid August 2019 with a 2 wk history intractable headaches and decreased vision OU  - found to have BP of 170s/90s -- sent to ED and was admitted to [facility] where SBP was up to 200  - discussed importance of tight BP control   - pt reports improved compliance with BP medications and improvement in BP  6. Acute CVA secondary to  uncontrolled HTN  - MRI on 8.14.19:  IMPRESSION:  1. Limited 2 sequence MRI head: Acute RIGHT periatrial subcentimeter  nonhemorrhagic infarct, in region of optic tract.  - could be limiting vision -- will check formal visual field once macular edema improves  7. Ocular Hypertention OU  - today, IOP OD 15, OS 16  - patient reports not taking cosopt bid OU for about a month--IOP OK OU  8. Mixed cataract OD - The symptoms of cataract, surgical options, and treatments and risks were discussed with patient. - discussed diagnosis and progression - under the expert management of Dr. Kathlen Mody - clear from a retina standpoint to proceed with cataract surgery when patient and surgeon are ready  9. Pseudophakia OS  - s/p CE/IOL (07.29.21, Dr. Kathlen Mody)  - IOL in good position, doing well - monitor   Ophthalmic Meds Ordered this visit:  No orders of the defined types were placed in this encounter.      Return in about 1 week (around 03/02/2021) for Possible Injxn.  There are no Patient Instructions on file for this visit.   This document serves as a record of services personally performed by Gardiner Sleeper, MD, PhD. It was created on their behalf by Estill Bakes, COT an ophthalmic technician. The creation of this record is the provider's dictation and/or activities during the visit.    Electronically signed by: Estill Bakes, COT 9.12.22 @  1:42 AM   This document serves as a record of services personally performed by Gardiner Sleeper, MD, PhD. It was created on their behalf by San Jetty. Owens Shark, OA an ophthalmic technician. The creation of this record is the provider's dictation and/or activities during the visit.    Electronically signed by: San Jetty. Owens Shark, [place] 09.13.2022 1:42 AM   Gardiner Sleeper, M.D., Ph.D. Diseases & Surgery of the Retina and Vitreous [facility]  I have reviewed the above documentation for accuracy and completeness, and I agree with the  above. Gardiner Sleeper, M.D., Ph.D. 02/26/21 1:45 AM  Abbreviations: M myopia (nearsighted); A astigmatism; H hyperopia (farsighted); P presbyopia; Mrx spectacle prescription;  CTL contact lenses; OD right eye; OS left eye; OU both eyes  XT exotropia; ET esotropia; PEK punctate epithelial keratitis; PEE punctate epithelial erosions; DES dry eye syndrome; MGD meibomian gland dysfunction; ATs artificial tears; PFAT's preservative free artificial tears; [place] nuclear sclerotic cataract; PSC posterior subcapsular cataract; ERM epi-retinal membrane; PVD posterior vitreous detachment; RD retinal detachment; DM diabetes mellitus; DR diabetic retinopathy; NPDR non-proliferative diabetic retinopathy; PDR proliferative diabetic retinopathy; CSME clinically significant macular edema; DME diabetic macular edema; dbh dot blot hemorrhages; CWS cotton wool spot; POAG primary open angle glaucoma; C/D cup-to-disc ratio; HVF humphrey visual field; GVF goldmann visual field; OCT optical coherence tomography; IOP intraocular pressure; BRVO Branch retinal vein occlusion; CRVO central retinal vein occlusion; CRAO central retinal artery occlusion; BRAO branch retinal artery occlusion; RT retinal tear; SB scleral buckle; PPV pars plana vitrectomy; VH Vitreous hemorrhage; PRP panretinal laser photocoagulation; IVK intravitreal kenalog; VMT vitreomacular traction; MH Macular hole;  NVD neovascularization of the disc; NVE neovascularization elsewhere; AREDS age related eye disease study; ARMD age related macular degeneration; POAG primary open angle glaucoma; EBMD epithelial/anterior basement membrane dystrophy; ACIOL anterior chamber intraocular lens; IOL intraocular lens; PCIOL posterior chamber intraocular lens; Phaco/IOL phacoemulsification with intraocular lens placement; [place] photorefractive keratectomy; LASIK laser assisted in situ keratomileusis; HTN hypertension; DM diabetes mellitus; COPD chronic obstructive pulmonary disease

## 2021-02-23 ENCOUNTER — Ambulatory Visit (INDEPENDENT_AMBULATORY_CARE_PROVIDER_SITE_OTHER): Payer: 59 | Admitting: Ophthalmology

## 2021-02-23 ENCOUNTER — Encounter (INDEPENDENT_AMBULATORY_CARE_PROVIDER_SITE_OTHER): Payer: Self-pay | Admitting: Ophthalmology

## 2021-02-23 ENCOUNTER — Other Ambulatory Visit: Payer: Self-pay

## 2021-02-23 DIAGNOSIS — H3581 Retinal edema: Secondary | ICD-10-CM

## 2021-02-23 DIAGNOSIS — H4313 Vitreous hemorrhage, bilateral: Secondary | ICD-10-CM

## 2021-02-23 DIAGNOSIS — H35033 Hypertensive retinopathy, bilateral: Secondary | ICD-10-CM

## 2021-02-23 DIAGNOSIS — I10 Essential (primary) hypertension: Secondary | ICD-10-CM | POA: Diagnosis not present

## 2021-02-23 DIAGNOSIS — H25811 Combined forms of age-related cataract, right eye: Secondary | ICD-10-CM

## 2021-02-23 DIAGNOSIS — E113513 Type 2 diabetes mellitus with proliferative diabetic retinopathy with macular edema, bilateral: Secondary | ICD-10-CM | POA: Diagnosis not present

## 2021-02-23 DIAGNOSIS — H40053 Ocular hypertension, bilateral: Secondary | ICD-10-CM

## 2021-02-23 DIAGNOSIS — I639 Cerebral infarction, unspecified: Secondary | ICD-10-CM

## 2021-02-23 DIAGNOSIS — Z961 Presence of intraocular lens: Secondary | ICD-10-CM

## 2021-03-01 NOTE — Progress Notes (Signed)
[facility] Note  03/04/2021     CHIEF COMPLAINT Patient presents for Retina Follow Up   HISTORY OF PRESENT ILLNESS: Bill [name] is a 40 y.o. male who presents to the clinic today for:  HPI     Retina Follow Up   Patient presents with  Diabetic Retinopathy.  In both eyes.  Severity is mild.  Duration of 1.5 weeks.  Since onset it is stable.  I, the attending physician,  performed the HPI with the patient and updated documentation appropriately.        Comments   Pt here for 1/5 wk ret f/u for PDR OU. Pt states vision is the same, no changes. Blood sugar yesterday 160.       Last edited by [name], MD on 03/04/2021 10:10 AM.     Referring physician: Isaac Bliss, Rayford Halsted, MD [address] [place],  [place] [address]  HISTORICAL INFORMATION:   Selected notes from the MEDICAL RECORD NUMBER Referred by Dr. [name] for concern of severe NPDR / PDR OU   CURRENT MEDICATIONS: No current outpatient medications on file. (Ophthalmic Drugs)   No current facility-administered medications for this visit. (Ophthalmic Drugs)   Current Outpatient Medications (Other)  Medication Sig   aspirin 81 MG EC tablet Take 81 mg by mouth as needed. (Patient not taking: Reported on 02/23/2021)   atorvastatin (LIPITOR) 10 MG tablet Take 1 tablet (10 mg total) by mouth daily.   AURYXIA 1 GM 210 MG(Fe) tablet Take 210 mg by mouth 3 (three) times daily with meals.   Dulaglutide (TRULICITY) 9.79 YI/0.1KP SOPN Inject 0.75 mg into the skin once a week.   ferric citrate (AURYXIA) 1 GM 210 MG(Fe) tablet Take 210 mg by mouth as directed.   fluticasone (FLONASE) 50 MCG/ACT nasal spray Place 1 spray into both nostrils daily as needed for allergies or rhinitis.   Methoxy PEG-Epoetin Beta (MIRCERA IJ) Mircera   multivitamin (RENA-VIT) TABS tablet Take 1 tablet by mouth daily.   sertraline (ZOLOFT) 50 MG tablet TAKE 1 TABLET(50 MG) BY MOUTH DAILY   No  current facility-administered medications for this visit. (Other)      REVIEW OF SYSTEMS: ROS   Positive for: Neurological, Genitourinary, Endocrine, Cardiovascular, Eyes, Psychiatric Negative for: Constitutional, Gastrointestinal, Skin, Musculoskeletal, HENT, Respiratory, Allergic/Imm, Heme/Lymph Last edited by Kingsley Spittle, COT on 03/04/2021  8:52 AM.         ALLERGIES Allergies  Allergen Reactions   Food     Melons-itchy/headaches    PAST MEDICAL HISTORY Past Medical History:  Diagnosis Date   Anemia    Anxiety    situational   Diabetes mellitus without complication ([place])    Diabetic retinopathy ([place])    PDR OU   ESRD on dialysis ([place])    Dialyzing T-Th-S   History of blood transfusion    Hypertension    PONV (postoperative nausea and vomiting)    03/22/2019- only 15 years ago   Renal disorder    Stroke [facility]) 2019   vision   Past Surgical History:  Procedure Laterality Date   A/V FISTULAGRAM N/A 03/11/2019   Procedure: A/V FISTULAGRAM - Left Arm;  Surgeon: Waynetta Sandy, MD;  Location: [facility];  Service: Cardiovascular;  Laterality: N/A;   arm surgery Right    Fracture   AV FISTULA PLACEMENT Left 01/25/2019   Procedure: ARTERIOVENOUS (AV) FISTULA CREATION  LEFT UPPER  ARM;  Surgeon: Trula Slade,  Butch Penny, MD;  Location: [facility] OR;  Service: Vascular;  Laterality: Left;   [place] Left 03/27/2019   Procedure: SECOND STAGE BASILIC VEIN TRANSPOSITION LEFT ARM;  Surgeon: Serafina Mitchell, MD;  Location: [facility] OR;  Service: Vascular;  Laterality: Left;   KIDNEY TRANSPLANT  11/05/2020   [facility]   WISDOM TOOTH EXTRACTION      FAMILY HISTORY Family History  Problem Relation Age of Onset   Diabetes Mellitus II Mother    Hypertension Mother    Cataracts Mother    Diabetes Mellitus II Father    Stroke Father    Hypertension Father    Glaucoma Maternal Grandmother    Amblyopia Neg Hx    Blindness Neg Hx     Macular degeneration Neg Hx    Retinal detachment Neg Hx    Strabismus Neg Hx    Retinitis pigmentosa Neg Hx     SOCIAL HISTORY Social History   Tobacco Use   Smoking status: Never   Smokeless tobacco: Never  Vaping Use   Vaping Use: Never used  Substance Use Topics   Alcohol use: Not Currently   Drug use: Not Currently         OPHTHALMIC EXAM:  Base Eye Exam     Visual Acuity (Snellen - Linear)       Right Left   Dist [place] 20/60 -1 20/50   Dist ph [place] 20/40 -1 20/40 -1         Tonometry (Tonopen, 9:03 AM)       Right Left   Pressure 14 20         Pupils       Dark Light Shape React APD   Right 4 3 Round Brisk None   Left 4 3 Round Brisk None         Visual Fields (Counting fingers)       Left Right    Full Full         Extraocular Movement       Right Left    Full, Ortho Full, Ortho         Neuro/Psych     Oriented x3: Yes   Mood/Affect: Normal         Dilation     Both eyes: 1.0% Mydriacyl, 2.5% Phenylephrine @ 9:04 AM           Slit Lamp and Fundus Exam     Slit Lamp Exam       Right Left   Lids/Lashes Normal Normal   Conjunctiva/Sclera White and quiet White and quiet   Cornea Trace Punctate epithelial erosions Trace Punctate epithelial erosions   Anterior Chamber Deep and quiet Deep and quiet   [place]ris Round and dilated, No NVI Round and dilated, no NVI   Lens 1+ Nuclear sclerosis, 1-2+ Cortical cataract, 2+Posterior subcapsular cataract PC IOL in good position   Vitreous VH improved, scattered fibrosis superior to disc, white blood stained vitreous condensations -- slightly improving, old, white blood clots settled inferiorly Syneresis, white blood stained vitreous condensations settling inferiorly and improving         Fundus Exam       Right Left   Disc 2-3+Pallor, sharp rim, fibrosis 2-3+Pallor, Sharp rim, Compact, +fibrosis, NVD, no heme   C/D Ratio 0.1 0.2   Macula Blunted foveal reflex, +fibrosis, +exudates  - improved, IRH/MA (improving), ERM with macular pseudo hole like appearance Flat, blunted foveal reflex, atrophic, flat choroidal nevus superior  macula   Vessels Severe attenuation, Tortuous, +fibrosis, +NV inferior to disc -- regressing Sclerotic arterioles, Tortuous, +NV -- regressing, fibrosis along superior arcades, severe Vascular attenuation   Periphery Attached, 360 PRP w/ good posterior fill in, tractional fibrosis superior and along arcades, greatest above disc Attached; 360 PRP in place, good posterior fill in, scattered tractional fibrosis           IMAGING AND PROCEDURES  Imaging and Procedures for '@TODAY' @  OCT, Retina - OU - Both Eyes       Right Eye Quality was good. Central Foveal Thickness: 341. Progression has been stable. Findings include abnormal foveal contour, intraretinal hyper-reflective material, preretinal fibrosis, vitreous traction, epiretinal membrane, no SRF, macular pucker, inner retinal atrophy, no IRF (Persistent PRF and ERM).   Left Eye Quality was good. Central Foveal Thickness: 230. Progression has been stable. Findings include abnormal foveal contour, intraretinal hyper-reflective material, no SRF, inner retinal atrophy, epiretinal membrane, preretinal fibrosis, outer retinal atrophy, vitreomacular adhesion , no IRF, vitreous traction (persistent retinal atrophy, tractional edema nasal to disc).   Notes *Images captured and stored on drive  Diagnosis / Impression:  PDR w/ tractional fibrosis OU OD: Persistent PRF and ERM OS: persistent retinal atrophy, tractional edema nasal to disc  Clinical management:  See below  Abbreviations: NFP - Normal foveal profile. CME - cystoid macular edema. PED - pigment epithelial detachment. IRF - intraretinal fluid. SRF - subretinal fluid. EZ - ellipsoid zone. ERM - epiretinal membrane. ORA - outer retinal atrophy. ORT - outer retinal tubulation. SRHM - subretinal hyper-reflective material       Intravitreal  Injection, Pharmacologic Agent - OD - Right Eye       Time Out 03/04/2021. 9:27 AM. Confirmed correct patient, procedure, site, and patient consented.   Anesthesia Topical anesthesia was used. Anesthetic medications included Lidocaine 2%, Proparacaine 0.5%.   Procedure Preparation included 5% betadine to ocular surface, eyelid speculum. A supplied needle was used.   Injection: 1.25 mg Bevacizumab 1.42m/0.05ml   Route: Intravitreal, Site: Right Eye   NDC:: 08022-336-12 Lot: [id number]' , Expiration date: 04/28/2021, Waste: 0 mL   Post-op Post injection exam found visual acuity of at least counting fingers. The patient tolerated the procedure well. There were no complications. The patient received written and verbal post procedure care education. Post injection medications were not given.      Intravitreal Injection, Pharmacologic Agent - OS - Left Eye       Time Out 03/04/2021. 9:29 AM. Confirmed correct patient, procedure, site, and patient consented.   Anesthesia Topical anesthesia was used. Anesthetic medications included Lidocaine 2%, Proparacaine 0.5%.   Procedure Preparation included 5% betadine to ocular surface, eyelid speculum. A (32g) needle was used.   Injection: 1.25 mg Bevacizumab 1.244m0.05ml   Route: Intravitreal, Site: Left Eye   NDC: 30mLot: [id number], Expiration date: 04/04/2021, Waste: 0.05 mL   Post-op Post injection exam found visual acuity of at least counting fingers. The patient tolerated the procedure well. There were no complications. The patient received written and verbal post procedure care education. Post injection medications were not given.             ASSESSMENT/PLAN:   ICD-10-CM   1. Proliferative diabetic retinopathy of both eyes with macular edema associated with type 2 diabetes mellitus (HCC)  E111/05/2022ntravitreal Injection, Pharmacologic Agent - OD - Right Eye    Intravitreal Injection, Pharmacologic Agent - OS - Left Eye     Bevacizumab (AVASTIN) SOLN 1.25 mg  Bevacizumab (AVASTIN) SOLN 1.25 mg    2. Vitreous hemorrhage of both eyes ([place])  H43.13     3. Retinal edema  H35.81 OCT, Retina - OU - Both Eyes    4. Essential hypertension  I10     5. Hypertensive retinopathy of both eyes  H35.033     6. Acute CVA (cerebrovascular accident) ([place])  I63.9     7. Ocular hypertension, bilateral  H40.053     8. Combined forms of age-related cataract of right eye  H25.811     9. Pseudophakia  Z96.1      1-3. Proliferative diabetic retinopathy OU  - delayed follow up from 8 weeks to 14 weeks -- was in [place] for the summer  - last A1c was 7,8 on 7.202.22; 7.2 on 01.13.22  - s/p PRP OD (10.02.19), (01.22.20), (02.12.20), (03.08.22)  - s/p PRP OS (05/02/2018), (02.19.20), (2.17.22)  - s/p IVK #1 OD (01.22.20), OS (02.12.20)  - s/p IVA OD #1 (10.21.20), #2 (12.02.20), #3 (01.13.21), #4 (02.10.21), #5 (04.19.21), #6 (05.21.21), #7 (06.21.21), #8 (08.19.21), #9 (10.11.21), #10 (12.01.21), #11 (01.25.22), #12 (03.01.22), #13 (04.05.22), #14 (6.6.22)  - s/p IVA OS #1 (12.02.20), #2 (04.19.21), #3 (05.21.21), #4 (06.21.21), #5 (08.19.21), #6 (10.11.21), #7 (12.01.21), #8 (01.25.22), #9 (03.01.22), #10 (04.05.22), #11 (6.6.22)  - FA 8.19.19 shows leaking MA, patches of capillary nonperfusion OU  - repeat FA 10.11.21 shows persistent NV OU, large patches of vascular nonperfusion OU (OS>OD) -- fill in PRP completed OU (OS  2.17.22, OD 3.8.22)  - repeat FA 9.13.22 shows interval improvement in NV/leakage  - BCVA OD: 20/50, OS: 20/60 (stable OU)  - exam shows interval decrease in VH, preretinal and subhyaloid hemorrhage, persistent preretinal fibrosis OU  - OCT shows OD: Persistent PRF and ERM; OS: persistent retinal atrophy, tractional edema nasal to disc  - VH precautions reviewed -- minimize activities, keep head elevated, avoid ASA/NSAIDs/blood thinners as able  - pt on heparin  - discussed possibility of surgery being  needed in the future to stabilize eye and prevent further progression of diabetic eye disease  - recommend IVA OU (OD #15 and OS #12) today, 09.22.22 w/ return again in 12 wks  - pt wants wishes to proceed with injections  - RBA of procedure discussed, questions answered - see procedure note  - Avastin informed consent form signed and scanned on 01.13.2021  - f/u 12 weeks DFE, OCT  4,5. 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 [name] in mid August 2019 with a 2 wk history intractable headaches and decreased vision OU  - found to have BP of 170s/90s -- sent to ED and was admitted to [facility] where SBP was up to 200  - discussed importance of tight BP control   - pt reports improved compliance with BP medications and improvement in BP  6. 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 Limited 2 sequence MRI head: Acute RIGHT periatrial subcentimeter  nonhemorrhagic infarct, in region of optic tract.  - could be limiting vision -- will check formal visual field once macular edema improves  7. Ocular Hypertention OU  - today, IOP OD 15, OS 16  - patient reports not taking cosopt bid OU for about a month--IOP OK OU  8. Mixed cataract OD - The symptoms of cataract, surgical options, and treatments and risks were discussed with patient. - discussed diagnosis and progression - under the expert  management of Dr. Kathlen Mody - clear from a retina standpoint to proceed with cataract surgery when patient and surgeon are ready  9. Pseudophakia OS  - s/p CE/IOL (07.29.21, Dr. Kathlen Mody)  - IOL in good position, doing well - monitor   Ophthalmic Meds Ordered this visit:  Meds ordered this encounter  Medications   Bevacizumab (AVASTIN) SOLN 1.25 mg   Bevacizumab (AVASTIN) SOLN 1.25 mg      Return in about 12 weeks (around 05/27/2021) for f/u PDR OU, DFE, OCT.  There are no Patient Instructions on file for this visit.    This document serves as a record of services personally performed by Gardiner Sleeper, MD, PhD. It was created on their behalf by San Jetty. Owens Shark, OA an ophthalmic technician. The creation of this record is the provider's dictation and/or activities during the visit.    Electronically signed by: San Jetty. Owens Shark, [place] 09.19.2022 10:16 AM  Gardiner Sleeper, M.D., Ph.D. Diseases & Surgery of the Retina and Vitreous [facility]  I have reviewed the above documentation for accuracy and completeness, and I agree with the above. Gardiner Sleeper, M.D., Ph.D. 03/04/21 10:20 AM    Abbreviations: M myopia (nearsighted); A astigmatism; H hyperopia (farsighted); P presbyopia; Mrx spectacle prescription;  CTL contact lenses; OD right eye; OS left eye; OU both eyes  XT exotropia; ET esotropia; PEK punctate epithelial keratitis; PEE punctate epithelial erosions; DES dry eye syndrome; MGD meibomian gland dysfunction; ATs artificial tears; PFAT's preservative free artificial tears; [place] nuclear sclerotic cataract; PSC posterior subcapsular cataract; ERM epi-retinal membrane; PVD posterior vitreous detachment; RD retinal detachment; DM diabetes mellitus; DR diabetic retinopathy; NPDR non-proliferative diabetic retinopathy; PDR proliferative diabetic retinopathy; CSME clinically significant macular edema; DME diabetic macular edema; dbh dot blot hemorrhages; CWS cotton wool spot; POAG primary open angle glaucoma; C/D cup-to-disc ratio; HVF humphrey visual field; GVF goldmann visual field; OCT optical coherence tomography; IOP intraocular pressure; BRVO Branch retinal vein occlusion; CRVO central retinal vein occlusion; CRAO central retinal artery occlusion; BRAO branch retinal artery occlusion; RT retinal tear; SB scleral buckle; PPV pars plana vitrectomy; VH Vitreous hemorrhage; PRP panretinal laser photocoagulation; IVK intravitreal kenalog; VMT vitreomacular traction; MH Macular hole;  NVD  neovascularization of the disc; NVE neovascularization elsewhere; AREDS age related eye disease study; ARMD age related macular degeneration; POAG primary open angle glaucoma; EBMD epithelial/anterior basement membrane dystrophy; ACIOL anterior chamber intraocular lens; IOL intraocular lens; PCIOL posterior chamber intraocular lens; Phaco/IOL phacoemulsification with intraocular lens placement; [place] photorefractive keratectomy; LASIK laser assisted in situ keratomileusis; HTN hypertension; DM diabetes mellitus; COPD chronic obstructive pulmonary disease

## 2021-03-04 ENCOUNTER — Other Ambulatory Visit: Payer: Self-pay

## 2021-03-04 ENCOUNTER — Encounter (INDEPENDENT_AMBULATORY_CARE_PROVIDER_SITE_OTHER): Payer: Self-pay | Admitting: Ophthalmology

## 2021-03-04 ENCOUNTER — Ambulatory Visit (INDEPENDENT_AMBULATORY_CARE_PROVIDER_SITE_OTHER): Payer: 59 | Admitting: Ophthalmology

## 2021-03-04 DIAGNOSIS — H4313 Vitreous hemorrhage, bilateral: Secondary | ICD-10-CM | POA: Diagnosis not present

## 2021-03-04 DIAGNOSIS — I10 Essential (primary) hypertension: Secondary | ICD-10-CM

## 2021-03-04 DIAGNOSIS — I639 Cerebral infarction, unspecified: Secondary | ICD-10-CM

## 2021-03-04 DIAGNOSIS — H35033 Hypertensive retinopathy, bilateral: Secondary | ICD-10-CM

## 2021-03-04 DIAGNOSIS — E113513 Type 2 diabetes mellitus with proliferative diabetic retinopathy with macular edema, bilateral: Secondary | ICD-10-CM

## 2021-03-04 DIAGNOSIS — Z961 Presence of intraocular lens: Secondary | ICD-10-CM

## 2021-03-04 DIAGNOSIS — H40053 Ocular hypertension, bilateral: Secondary | ICD-10-CM

## 2021-03-04 DIAGNOSIS — H25811 Combined forms of age-related cataract, right eye: Secondary | ICD-10-CM

## 2021-03-04 DIAGNOSIS — H3581 Retinal edema: Secondary | ICD-10-CM

## 2021-03-04 MED ORDER — BEVACIZUMAB CHEMO INJECTION 1.25MG/0.05ML SYRINGE FOR KALEIDOSCOPE
1.2500 mg | INTRAVITREAL | Status: AC | PRN
  Administered 2021-03-04: 1.25 mg via INTRAVITREAL

## 2021-04-19 ENCOUNTER — Other Ambulatory Visit: Payer: Self-pay | Admitting: Internal Medicine

## 2021-04-19 DIAGNOSIS — E785 Hyperlipidemia, unspecified: Secondary | ICD-10-CM

## 2021-04-19 DIAGNOSIS — E1169 Type 2 diabetes mellitus with other specified complication: Secondary | ICD-10-CM

## 2021-04-19 DIAGNOSIS — F411 Generalized anxiety disorder: Secondary | ICD-10-CM

## 2021-04-26 IMAGING — US US RENAL
1 series · 13 of 25 positions shown · non-contrast
Comparison: October 12, 2018

CLINICAL DATA: Renal failure.  Recent perinephric hematoma

EXAM:
RENAL / URINARY TRACT ULTRASOUND COMPLETE

[Series 1: us renal · 0.25mm/px · 13 of 39 slices shown]
[im 1/39]
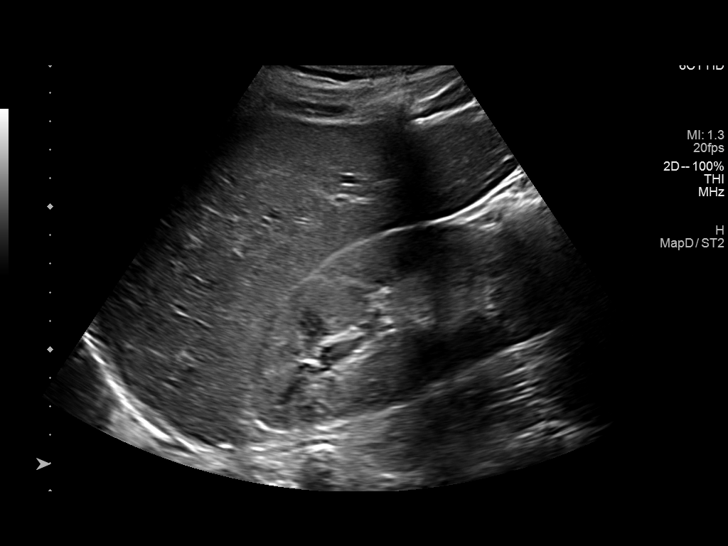
[im 4/39]
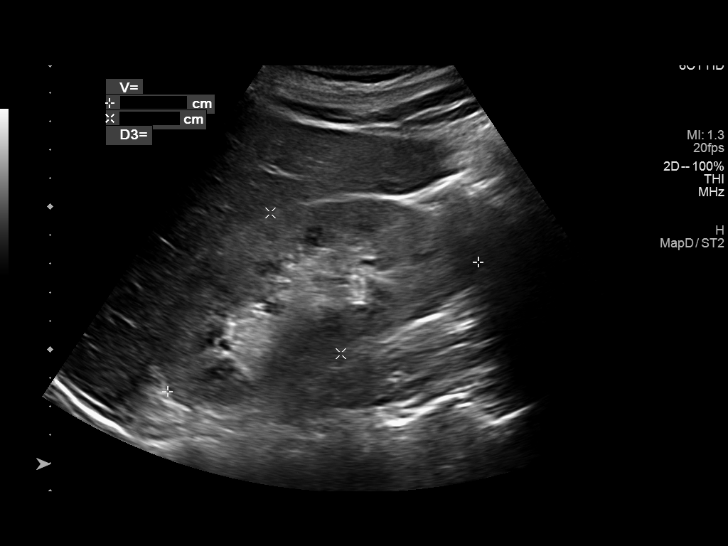
[im 7/39]
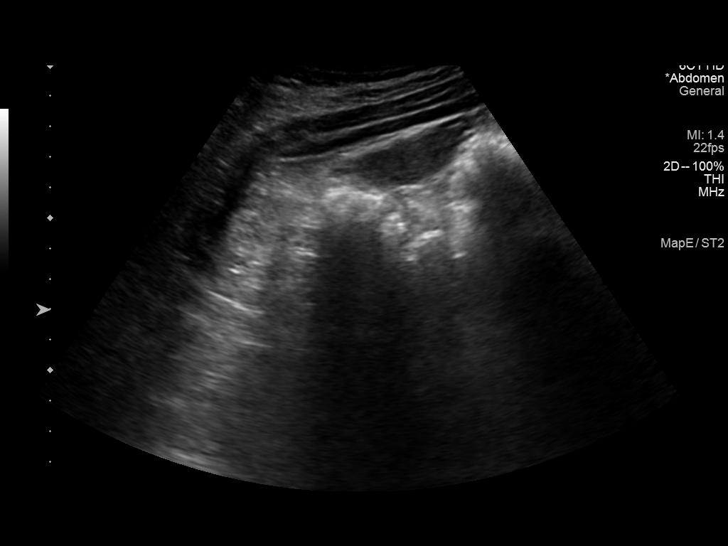
[im 10/39]
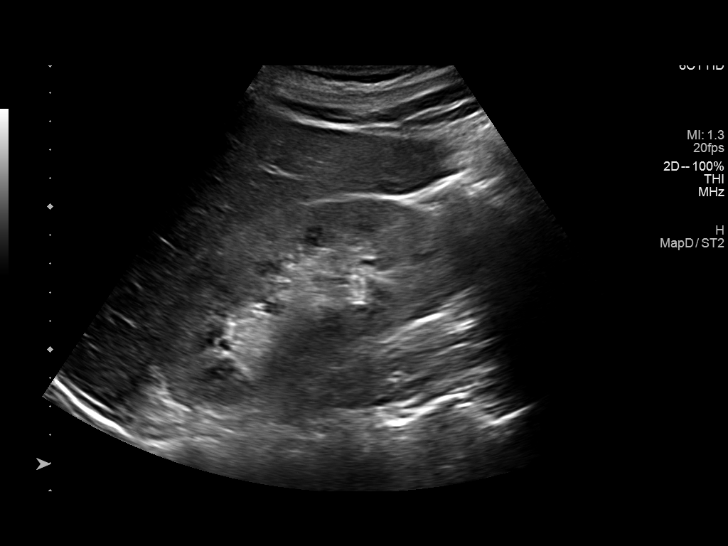
[im 13/39]
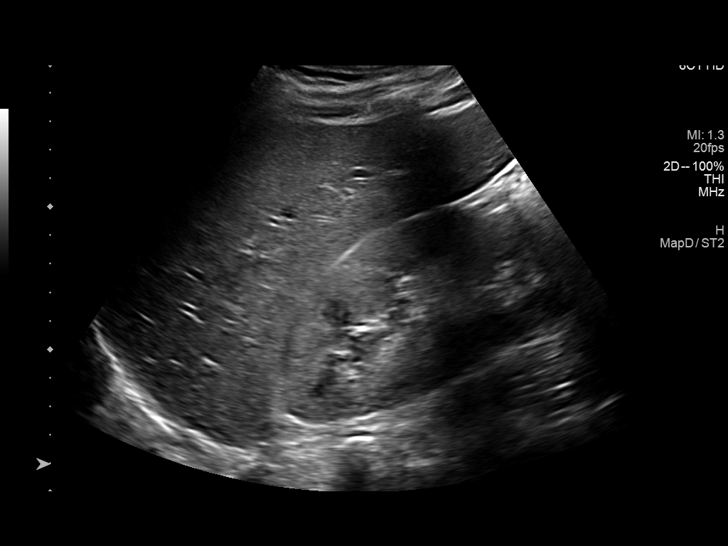
[im 16/39]
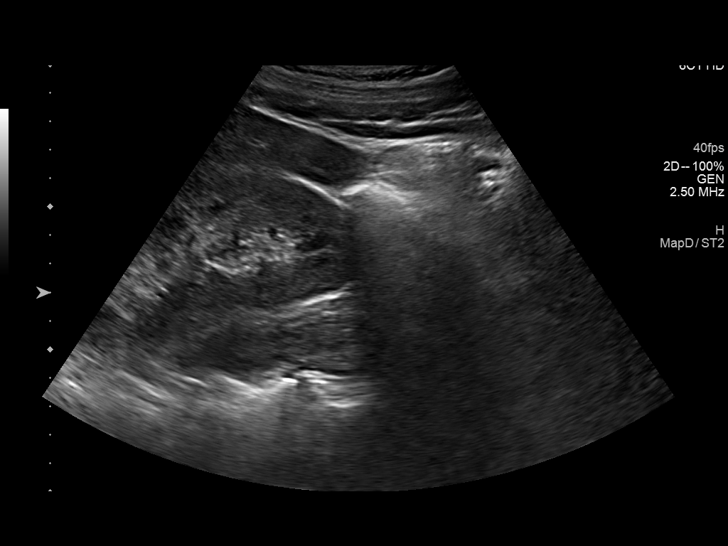
[im 20/39]
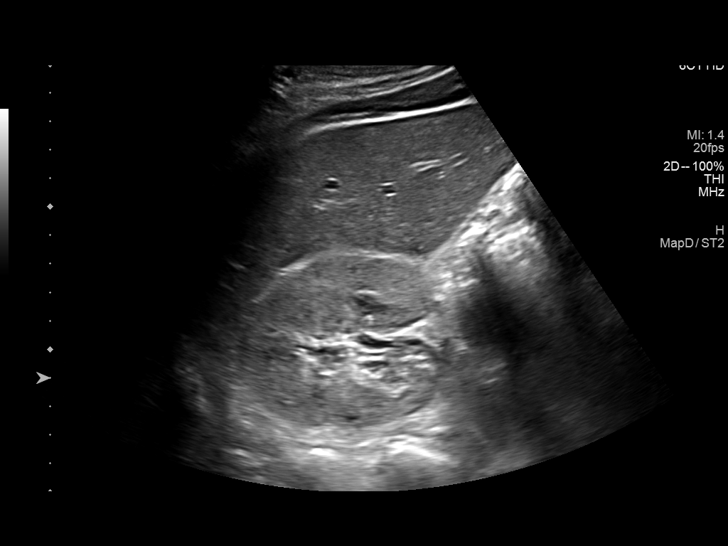
[im 23/39]
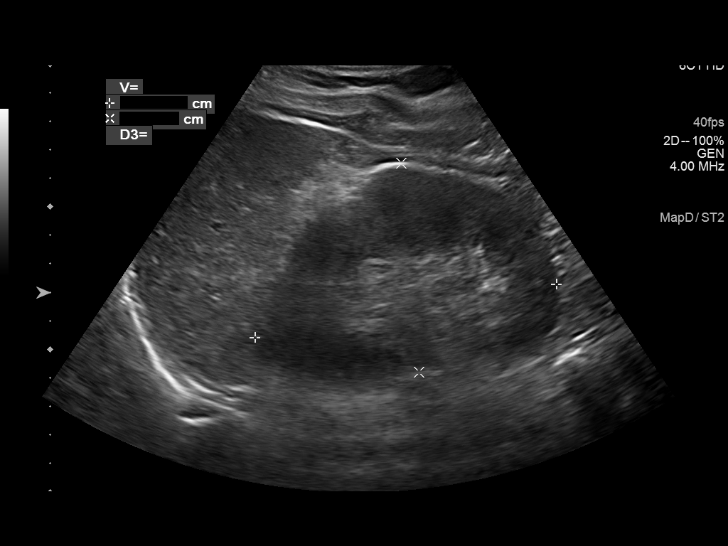
[im 26/39]
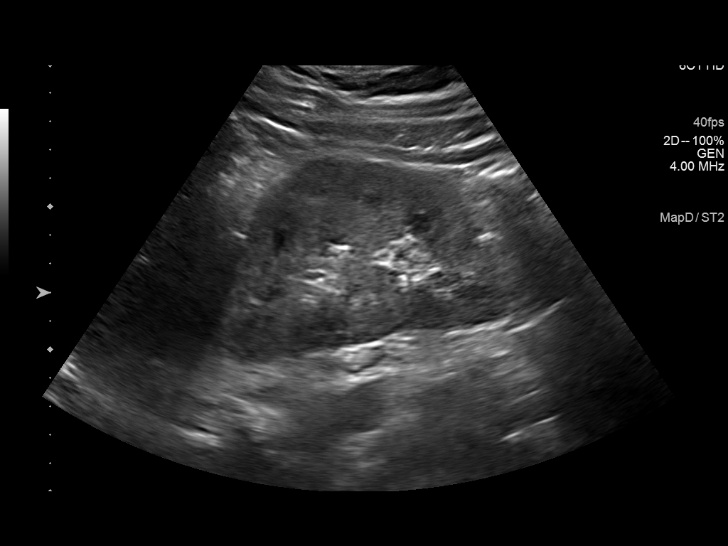
[im 29/39]
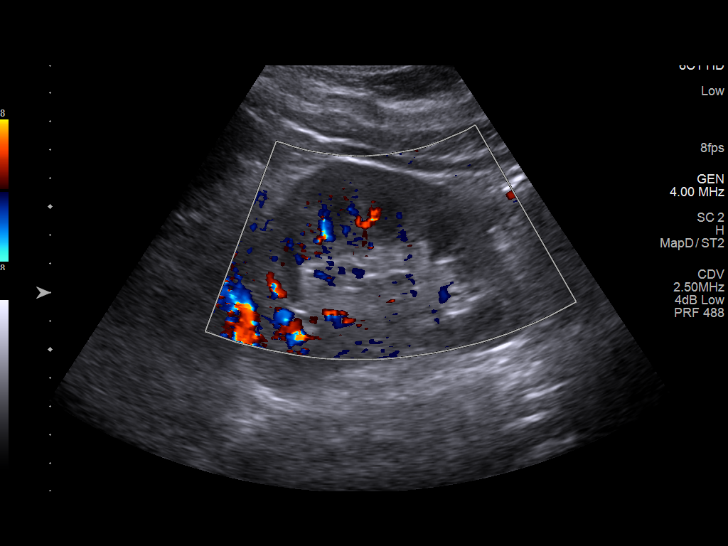
[im 32/39]
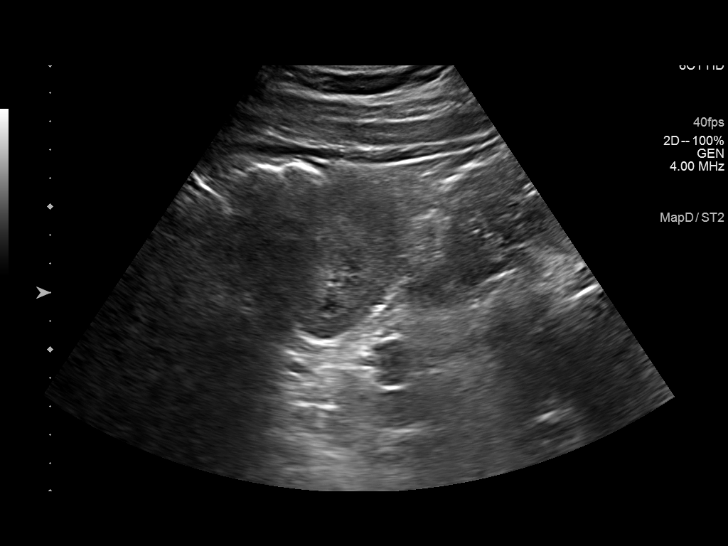
[im 35/39]
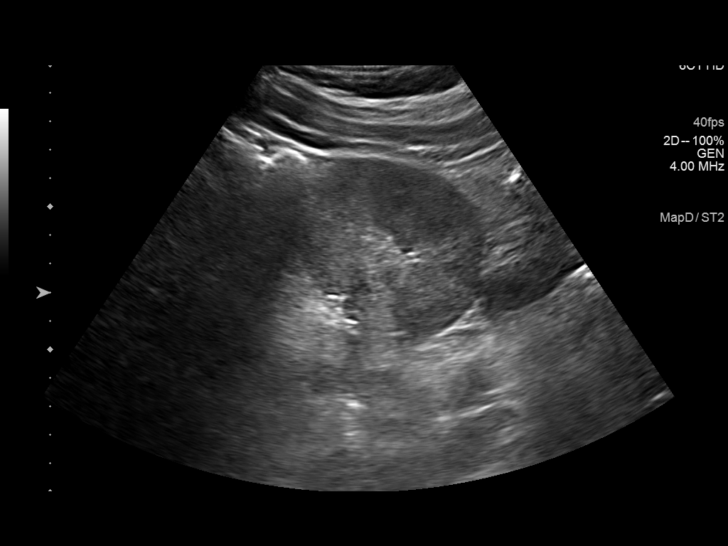
[im 39/39]
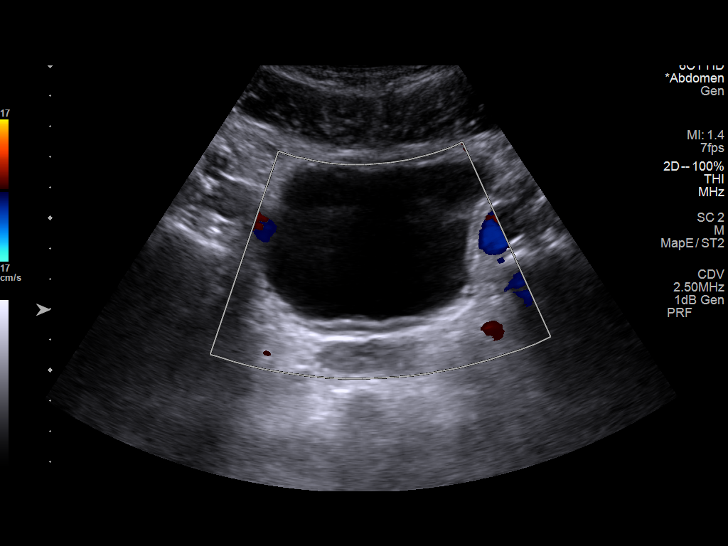

[13 of 25 positions shown; findings below may reference images not displayed]

FINDINGS: Right Kidney:

Renal measurements: 11.8 x 5.5 x 6.1 cm = volume: 206.8 mL .
Echogenicity is increased. Renal cortical thickness is unremarkable.
No mass or hydronephrosis visualized. There is no perinephric fluid
or soft tissue prominence. The previously noted right perinephric
hematoma is no longer evident by ultrasound. No sonographically
demonstrable calculus or ureterectasis.

Left Kidney:

Renal measurements: 10.7 x 7.4 x 7.2 cm = volume: 295.3 mL.
Echogenicity is increased. Renal cortical thickness is normal. No
mass, perinephric fluid, or hydronephrosis visualized. No
sonographically demonstrable calculus or ureterectasis.

Bladder:

Appears normal for degree of bladder distention.
IMPRESSION: The previously noted perinephric hematoma on the right is no longer
evident. No perinephric fluid collection or soft tissue thickening
noted by ultrasound on either side. No obstructing focus in either
kidney.

Renal echogenicity is increased, a finding indicative of medical
renal disease. The renal cortical thickness is within normal limits
bilaterally.

## 2021-05-03 ENCOUNTER — Other Ambulatory Visit: Payer: Self-pay | Admitting: Internal Medicine

## 2021-05-03 DIAGNOSIS — E785 Hyperlipidemia, unspecified: Secondary | ICD-10-CM

## 2021-05-03 DIAGNOSIS — F411 Generalized anxiety disorder: Secondary | ICD-10-CM

## 2021-05-03 DIAGNOSIS — E1169 Type 2 diabetes mellitus with other specified complication: Secondary | ICD-10-CM

## 2021-05-25 NOTE — Progress Notes (Shared)
[facility] Note  05/27/2021     CHIEF COMPLAINT Patient presents for No chief complaint on file.   HISTORY OF PRESENT ILLNESS: Bill [name] is a 40 y.o. male who presents to the clinic today for:    Referring physician: Isaac Bliss, Rayford Halsted, MD [place],  [place] [address]  HISTORICAL INFORMATION:   Selected notes from the MEDICAL RECORD NUMBER Referred by Dr. [name] for concern of severe NPDR / PDR OU   CURRENT MEDICATIONS: No current outpatient medications on file. (Ophthalmic Drugs)   No current facility-administered medications for this visit. (Ophthalmic Drugs)   Current Outpatient Medications (Other)  Medication Sig   aspirin 81 MG EC tablet Take 81 mg by mouth as needed. (Patient not taking: Reported on 02/23/2021)   atorvastatin (LIPITOR) 10 MG tablet TAKE 1 TABLET BY MOUTH EVERY DAY   AURYXIA 1 GM 210 MG(Fe) tablet Take 210 mg by mouth 3 (three) times daily with meals.   Dulaglutide (TRULICITY) 3.54 SF/6.8LE SOPN Inject 0.75 mg into the skin once a week.   ferric citrate (AURYXIA) 1 GM 210 MG(Fe) tablet Take 210 mg by mouth as directed.   fluticasone (FLONASE) 50 MCG/ACT nasal spray Place 1 spray into both nostrils daily as needed for allergies or rhinitis.   Methoxy PEG-Epoetin Beta (MIRCERA IJ) Mircera   multivitamin (RENA-VIT) TABS tablet Take 1 tablet by mouth daily.   sertraline (ZOLOFT) 50 MG tablet TAKE 1 TABLET BY MOUTH EVERY DAY   No current facility-administered medications for this visit. (Other)      REVIEW OF SYSTEMS:       ALLERGIES Allergies  Allergen Reactions   Food     Melons-itchy/headaches    PAST MEDICAL HISTORY Past Medical History:  Diagnosis Date   Anemia    Anxiety    situational   Diabetes mellitus without complication ([place])    Diabetic retinopathy ([place])    PDR OU   ESRD on dialysis ([place])    Dialyzing T-Th-S   History of blood transfusion     Hypertension    PONV (postoperative nausea and vomiting)    03/22/2019- only 15 years ago   Renal disorder    Stroke [facility]) 2019   vision   Past Surgical History:  Procedure Laterality Date   A/V FISTULAGRAM N/A 03/11/2019   Procedure: A/V FISTULAGRAM - Left Arm;  Surgeon: Waynetta Sandy, MD;  Location: [facility];  Service: Cardiovascular;  Laterality: N/A;   arm surgery Right    Fracture   AV FISTULA PLACEMENT Left 01/25/2019   Procedure: ARTERIOVENOUS (AV) FISTULA CREATION  LEFT UPPER  ARM;  Surgeon: Serafina Mitchell, MD;  [facility]: [facility];  Service: Vascular;  Laterality: Left;   BASCILIC VEIN TRANSPOSITION Left 03/27/2019   Procedure: SECOND STAGE BASILIC VEIN TRANSPOSITION LEFT ARM;  Surgeon: Serafina Mitchell, MD;  [facility]: [facility];  Service: Vascular;  Laterality: Left;   KIDNEY TRANSPLANT  11/05/2020   [facility]   WISDOM TOOTH EXTRACTION      FAMILY HISTORY Family History  Problem Relation Age of Onset   Diabetes Mellitus II Mother    Hypertension Mother    Cataracts Mother    Diabetes Mellitus II Father    Stroke Father    Hypertension Father    Glaucoma Maternal Grandmother    Amblyopia Neg Hx    Blindness Neg Hx    Macular degeneration Neg Hx  Retinal detachment Neg Hx    Strabismus Neg Hx    Retinitis pigmentosa Neg Hx     SOCIAL HISTORY Social History   Tobacco Use   Smoking status: Never   Smokeless tobacco: Never  Vaping Use   Vaping Use: Never used  Substance Use Topics   Alcohol use: Not Currently   Drug use: Not Currently         OPHTHALMIC EXAM:  Not recorded    IMAGING AND PROCEDURES  Imaging and Procedures for @TODAY @           ASSESSMENT/PLAN:   ICD-10-CM   1. Proliferative diabetic retinopathy of both eyes with macular edema associated with type 2 diabetes mellitus ([place])  G29.5284     2. Vitreous hemorrhage of both eyes ([place])  H43.13     3. Retinal edema  H35.81     4. Essential hypertension   I10     5. Hypertensive retinopathy of both eyes  H35.033     6. Acute CVA (cerebrovascular accident) ([place])  I63.9     7. Ocular hypertension, bilateral  H40.053     8. Combined forms of age-related cataract of right eye  H25.811     9. Pseudophakia  Z96.1      1-3. Proliferative diabetic retinopathy OU  - delayed follow up from 8 weeks to 14 weeks -- was in [place] for the summer  - last A1c was 7,8 on 7.202.22; 7.2 on 01.13.22  - s/p PRP OD (10.02.19), (01.22.20), (02.12.20), (03.08.22)  - s/p PRP OS (05/02/2018), (02.19.20), (2.17.22)  - s/p IVK #1 OD (01.22.20), OS (02.12.20)  - s/p IVA OD #1 (10.21.20), #2 (12.02.20), #3 (01.13.21), #4 (02.10.21), #5 (04.19.21), #6 (05.21.21), #7 (06.21.21), #8 (08.19.21), #9 (10.11.21), #10 (12.01.21), #11 (01.25.22), #12 (03.01.22), #13 (04.05.22), #14 (06.06.22), #15 (09.22.22)  - s/p IVA OS #1 (12.02.20), #2 (04.19.21), #3 (05.21.21), #4 (06.21.21), #5 (08.19.21), #6 (10.11.21), #7 (12.01.21), #8 (01.25.22), #9 (03.01.22), #10 (04.05.22), #11 (06.06.22), #12 (09.22.22)  - FA 8.19.19 shows leaking MA, patches of capillary nonperfusion OU  - repeat FA 10.11.21 shows persistent NV OU, large patches of vascular nonperfusion OU (OS>OD) -- fill in PRP completed OU (OS  2.17.22, OD 3.8.22)  - repeat FA 9.13.22 shows interval improvement in NV/leakage  - BCVA OD: 20/50, OS: 20/60 (stable OU)  - exam shows interval decrease in VH, preretinal and subhyaloid hemorrhage, persistent preretinal fibrosis OU  - OCT shows OD: Persistent PRF and ERM; OS: persistent retinal atrophy, tractional edema nasal to disc  - VH precautions reviewed -- minimize activities, keep head elevated, avoid ASA/NSAIDs/blood thinners as able  - pt on heparin  - discussed possibility of surgery being needed in the future to stabilize eye and prevent further progression of diabetic eye disease  - recommend IVA OU (OD #16 and OS #13) today, 12.15.22 w/ return again in 12 wks  - pt wants  wishes to proceed with injections  - RBA of procedure discussed, questions answered - see procedure note  - [facility] informed consent form signed and scanned on 01.13.2021  - f/u 12 weeks DFE, OCT  4,5. 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 [name] in mid August 2019 with a 2 wk history intractable headaches and decreased vision OU  - found to have BP of 170s/90s -- sent to ED and was admitted to [facility] where SBP was up to 200  - discussed importance of tight BP control   -  pt reports improved compliance with BP medications and improvement in BP  6. 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 Limited 2 sequence MRI head: Acute RIGHT periatrial subcentimeter  nonhemorrhagic infarct, in region of optic tract.  - could be limiting vision -- will check formal visual field once macular edema improves  7. Ocular Hypertention OU  - today, IOP OD 15, OS 16  - patient reports not taking cosopt bid OU for about a month--IOP OK OU  8. Mixed cataract OD - The symptoms of cataract, surgical options, and treatments and risks were discussed with patient. - discussed diagnosis and progression - under the expert management of Dr. Kathlen Mody - clear from a retina standpoint to proceed with cataract surgery when patient and surgeon are ready  9. Pseudophakia OS  - s/p CE/IOL (07.29.21, Dr. Kathlen Mody)  - IOL in good position, doing well - monitor   Ophthalmic Meds Ordered this visit:  No orders of the defined types were placed in this encounter.     No follow-ups on file.  There are no Patient Instructions on file for this visit.   This document serves as a record of services personally performed by Gardiner Sleeper, MD, PhD. It was created on their behalf by San Jetty. Owens Shark, OA an ophthalmic technician. The creation of this record is the provider's dictation and/or activities during the visit.    Electronically signed  by: San Jetty. Marguerita Merles 12.13.2022 12:31 PM   Gardiner Sleeper, M.D., Ph.D. Diseases & Surgery of the Retina and Vitreous [facility]: M myopia (nearsighted); A astigmatism; H hyperopia (farsighted); P presbyopia; Mrx spectacle prescription;  CTL contact lenses; OD right eye; OS left eye; OU both eyes  XT exotropia; ET esotropia; PEK punctate epithelial keratitis; PEE punctate epithelial erosions; DES dry eye syndrome; MGD meibomian gland dysfunction; ATs artificial tears; PFAT's preservative free artificial tears; [place] nuclear sclerotic cataract; PSC posterior subcapsular cataract; ERM epi-retinal membrane; PVD posterior vitreous detachment; RD retinal detachment; DM diabetes mellitus; DR diabetic retinopathy; NPDR non-proliferative diabetic retinopathy; PDR proliferative diabetic retinopathy; CSME clinically significant macular edema; DME diabetic macular edema; dbh dot blot hemorrhages; CWS cotton wool spot; POAG primary open angle glaucoma; C/D cup-to-disc ratio; HVF humphrey visual field; GVF goldmann visual field; OCT optical coherence tomography; IOP intraocular pressure; BRVO Branch retinal vein occlusion; CRVO central retinal vein occlusion; CRAO central retinal artery occlusion; BRAO branch retinal artery occlusion; RT retinal tear; SB scleral buckle; PPV pars plana vitrectomy; VH Vitreous hemorrhage; PRP panretinal laser photocoagulation; IVK intravitreal kenalog; VMT vitreomacular traction; MH Macular hole;  NVD neovascularization of the disc; NVE neovascularization elsewhere; AREDS age related eye disease study; ARMD age related macular degeneration; POAG primary open angle glaucoma; EBMD epithelial/anterior basement membrane dystrophy; ACIOL anterior chamber intraocular lens; IOL intraocular lens; PCIOL posterior chamber intraocular lens; Phaco/IOL phacoemulsification with intraocular lens placement; [place] photorefractive keratectomy; LASIK laser  assisted in situ keratomileusis; HTN hypertension; DM diabetes mellitus; COPD chronic obstructive pulmonary disease

## 2021-05-27 ENCOUNTER — Encounter (INDEPENDENT_AMBULATORY_CARE_PROVIDER_SITE_OTHER): Payer: 59 | Admitting: Ophthalmology

## 2021-05-27 DIAGNOSIS — H25811 Combined forms of age-related cataract, right eye: Secondary | ICD-10-CM

## 2021-05-27 DIAGNOSIS — E113513 Type 2 diabetes mellitus with proliferative diabetic retinopathy with macular edema, bilateral: Secondary | ICD-10-CM

## 2021-05-27 DIAGNOSIS — H3581 Retinal edema: Secondary | ICD-10-CM

## 2021-05-27 DIAGNOSIS — H40053 Ocular hypertension, bilateral: Secondary | ICD-10-CM

## 2021-05-27 DIAGNOSIS — H4313 Vitreous hemorrhage, bilateral: Secondary | ICD-10-CM

## 2021-05-27 DIAGNOSIS — Z961 Presence of intraocular lens: Secondary | ICD-10-CM

## 2021-05-27 DIAGNOSIS — I639 Cerebral infarction, unspecified: Secondary | ICD-10-CM

## 2021-05-27 DIAGNOSIS — H35033 Hypertensive retinopathy, bilateral: Secondary | ICD-10-CM

## 2021-05-27 DIAGNOSIS — I10 Essential (primary) hypertension: Secondary | ICD-10-CM

## 2021-07-02 NOTE — Progress Notes (Shared)
[facility] Note  07/06/2021     CHIEF COMPLAINT Patient presents for No chief complaint on file.  HISTORY OF PRESENT ILLNESS: Bill [name] is a 41 y.o. male who presents to the clinic today for:    Referring physician: Isaac Bliss, Rayford Halsted, MD [place],  [place] [address]  HISTORICAL INFORMATION:   Selected notes from the MEDICAL RECORD NUMBER Referred by Dr. [name] for concern of severe NPDR / PDR OU   CURRENT MEDICATIONS: No current outpatient medications on file. (Ophthalmic Drugs)   No current facility-administered medications for this visit. (Ophthalmic Drugs)   Current Outpatient Medications (Other)  Medication Sig   aspirin 81 MG EC tablet Take 81 mg by mouth as needed. (Patient not taking: Reported on 02/23/2021)   atorvastatin (LIPITOR) 10 MG tablet TAKE 1 TABLET BY MOUTH EVERY DAY   AURYXIA 1 GM 210 MG(Fe) tablet Take 210 mg by mouth 3 (three) times daily with meals.   Dulaglutide (TRULICITY) 2.58 NI/7.7OE SOPN Inject 0.75 mg into the skin once a week.   ferric citrate (AURYXIA) 1 GM 210 MG(Fe) tablet Take 210 mg by mouth as directed.   fluticasone (FLONASE) 50 MCG/ACT nasal spray Place 1 spray into both nostrils daily as needed for allergies or rhinitis.   Methoxy PEG-Epoetin Beta (MIRCERA IJ) Mircera   multivitamin (RENA-VIT) TABS tablet Take 1 tablet by mouth daily.   sertraline (ZOLOFT) 50 MG tablet TAKE 1 TABLET BY MOUTH EVERY DAY   No current facility-administered medications for this visit. (Other)   REVIEW OF SYSTEMS:  ALLERGIES Allergies  Allergen Reactions   Food     Melons-itchy/headaches   PAST MEDICAL HISTORY Past Medical History:  Diagnosis Date   Anemia    Anxiety    situational   Diabetes mellitus without complication ([place])    Diabetic retinopathy ([place])    PDR OU   ESRD on dialysis ([place])    Dialyzing T-Th-S   History of blood transfusion    Hypertension    PONV  (postoperative nausea and vomiting)    03/22/2019- only 15 years ago   Renal disorder    Stroke [facility]) 2019   vision   Past Surgical History:  Procedure Laterality Date   A/V FISTULAGRAM N/A 03/11/2019   Procedure: A/V FISTULAGRAM - Left Arm;  Surgeon: Waynetta Sandy, MD;  Location: [facility];  Service: Cardiovascular;  Laterality: N/A;   arm surgery Right    Fracture   AV FISTULA PLACEMENT Left 01/25/2019   Procedure: ARTERIOVENOUS (AV) FISTULA CREATION  LEFT UPPER  ARM;  Surgeon: Serafina Mitchell, MD;  [facility];  Service: Vascular;  Laterality: Left;   [place] Left 03/27/2019   Procedure: SECOND STAGE BASILIC VEIN TRANSPOSITION LEFT ARM;  Surgeon: Serafina Mitchell, MD;  Location: [facility];  Service: Vascular;  Laterality: Left;   KIDNEY TRANSPLANT  11/05/2020   [facility]   WISDOM TOOTH EXTRACTION     FAMILY HISTORY Family History  Problem Relation Age of Onset   Diabetes Mellitus II Mother    Hypertension Mother    Cataracts Mother    Diabetes Mellitus II Father    Stroke Father    Hypertension Father    Glaucoma Maternal Grandmother    Amblyopia Neg Hx    Blindness Neg Hx    Macular degeneration Neg Hx    Retinal detachment Neg Hx    Strabismus Neg  Hx    Retinitis pigmentosa Neg Hx    SOCIAL HISTORY Social History   Tobacco Use   Smoking status: Never   Smokeless tobacco: Never  Vaping Use   Vaping Use: Never used  Substance Use Topics   Alcohol use: Not Currently   Drug use: Not Currently         OPHTHALMIC EXAM:  Not recorded    IMAGING AND PROCEDURES  Imaging and Procedures for @TODAY @           ASSESSMENT/PLAN: No diagnosis found.  1,2. Proliferative diabetic retinopathy OU  - delayed follow up from 8 weeks to 14 weeks -- was in [place] for the summer  - last A1c was 7,8 on 7.202.22; 7.2 on 01.13.22  - s/p PRP OD (10.02.19), (01.22.20), (02.12.20), (03.08.22)  - s/p PRP OS (05/02/2018),  (02.19.20), (2.17.22)  - s/p IVK #1 OD (01.22.20), OS (02.12.20)  - s/p IVA OD #1 (10.21.20), #2 (12.02.20), #3 (01.13.21), #4 (02.10.21), #5 (04.19.21), #6 (05.21.21), #7 (06.21.21), #8 (08.19.21), #9 (10.11.21), #10 (12.01.21), #11 (01.25.22), #12 (03.01.22), #13 (04.05.22), #14 (6.6.22), #15 (9.22.22)  - s/p IVA OS #1 (12.02.20), #2 (04.19.21), #3 (05.21.21), #4 (06.21.21), #5 (08.19.21), #6 (10.11.21), #7 (12.01.21), #8 (01.25.22), #9 (03.01.22), #10 (04.05.22), #11 (6.6.22), #12 (9.22.22)  - FA 8.19.19 shows leaking MA, patches of capillary nonperfusion OU  - repeat FA 10.11.21 shows persistent NV OU, large patches of vascular nonperfusion OU (OS>OD) -- fill in PRP completed OU (OS  2.17.22, OD 3.8.22)  - repeat FA 9.13.22 shows interval improvement in NV/leakage  - BCVA OD: 20/50, OS: 20/60 (stable OU)  - exam shows interval decrease in VH, preretinal and subhyaloid hemorrhage, persistent preretinal fibrosis OU  - OCT shows OD: Persistent PRF and ERM; OS: persistent retinal atrophy, tractional edema nasal to disc  - VH precautions reviewed -- minimize activities, keep head elevated, avoid ASA/NSAIDs/blood thinners as able  - pt on heparin  - discussed possibility of surgery being needed in the future to stabilize eye and prevent further progression of diabetic eye disease  - recommend IVA OU (OD #16 and OS #13) today, 1.24.23 w/ return again in 12 wks  - pt wants wishes to proceed with injections  - RBA of procedure discussed, questions answered - see procedure note  - [facility]in informed consent form signed and scanned on 01.13.2021  - f/u 12 weeks DFE, OCT  3,4. 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 [name] in mid August 2019 with a 2 wk history intractable headaches and decreased vision OU  - found to have BP of 170s/90s -- sent to ED and was admitted to [facility] where SBP was up to 200  - discussed importance of tight BP control   - pt  reports improved compliance with BP medications and improvement in BP  5. 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 Limited 2 sequence MRI head: Acute RIGHT periatrial subcentimeter  nonhemorrhagic infarct, in region of optic tract.  - could be limiting vision -- will check formal visual field once macular edema improves  6. Ocular Hypertention OU  - today, IOP OD 15, OS 16  - patient reports not taking cosopt bid OU for about a month--IOP OK OU  7. Mixed cataract OD - The symptoms of cataract, surgical options, and treatments and risks were discussed with patient. - discussed diagnosis and progression - under the expert management of Dr. Kathlen Mody -  clear from a retina standpoint to proceed with cataract surgery when patient and surgeon are ready  8. Pseudophakia OS  - s/p CE/IOL (07.29.21, Dr. Kathlen Mody)  - IOL in good position, doing well - monitor  Ophthalmic Meds Ordered this visit:  No orders of the defined types were placed in this encounter.     No follow-ups on file.  There are no Patient Instructions on file for this visit.   This document serves as a record of services personally performed by Gardiner Sleeper, MD, PhD. It was created on their behalf by Estill Bakes, COT an ophthalmic technician. The creation of this record is the provider's dictation and/or activities during the visit.    Electronically signed by: Estill Bakes, COT 1.20.23 @ 12:05 PM   Gardiner Sleeper, M.D., Ph.D. Diseases & Surgery of the Retina and Vitreous [facility] [place]: M myopia (nearsighted); A astigmatism; H hyperopia (farsighted); P presbyopia; Mrx spectacle prescription;  CTL contact lenses; OD right eye; OS left eye; OU both eyes  XT exotropia; ET esotropia; PEK punctate epithelial keratitis; PEE punctate epithelial erosions; DES dry eye syndrome; MGD meibomian gland dysfunction; ATs artificial tears; PFAT's preservative  free artificial tears; [place] nuclear sclerotic cataract; PSC posterior subcapsular cataract; ERM epi-retinal membrane; PVD posterior vitreous detachment; RD retinal detachment; DM diabetes mellitus; DR diabetic retinopathy; NPDR non-proliferative diabetic retinopathy; PDR proliferative diabetic retinopathy; CSME clinically significant macular edema; DME diabetic macular edema; dbh dot blot hemorrhages; CWS cotton wool spot; POAG primary open angle glaucoma; C/D cup-to-disc ratio; HVF humphrey visual field; GVF goldmann visual field; OCT optical coherence tomography; IOP intraocular pressure; BRVO Branch retinal vein occlusion; CRVO central retinal vein occlusion; CRAO central retinal artery occlusion; BRAO branch retinal artery occlusion; RT retinal tear; SB scleral buckle; PPV pars plana vitrectomy; VH Vitreous hemorrhage; PRP panretinal laser photocoagulation; IVK intravitreal kenalog; VMT vitreomacular traction; MH Macular hole;  NVD neovascularization of the disc; NVE neovascularization elsewhere; AREDS age related eye disease study; ARMD age related macular degeneration; POAG primary open angle glaucoma; EBMD epithelial/anterior basement membrane dystrophy; ACIOL anterior chamber intraocular lens; IOL intraocular lens; PCIOL posterior chamber intraocular lens; Phaco/IOL phacoemulsification with intraocular lens placement; [place] photorefractive keratectomy; LASIK laser assisted in situ keratomileusis; HTN hypertension; DM diabetes mellitus; COPD chronic obstructive pulmonary disease

## 2021-07-06 ENCOUNTER — Encounter (INDEPENDENT_AMBULATORY_CARE_PROVIDER_SITE_OTHER): Payer: 59 | Admitting: Ophthalmology

## 2021-07-06 DIAGNOSIS — H35033 Hypertensive retinopathy, bilateral: Secondary | ICD-10-CM

## 2021-07-06 DIAGNOSIS — Z961 Presence of intraocular lens: Secondary | ICD-10-CM

## 2021-07-06 DIAGNOSIS — I639 Cerebral infarction, unspecified: Secondary | ICD-10-CM

## 2021-07-06 DIAGNOSIS — H25811 Combined forms of age-related cataract, right eye: Secondary | ICD-10-CM

## 2021-07-06 DIAGNOSIS — H4313 Vitreous hemorrhage, bilateral: Secondary | ICD-10-CM

## 2021-07-06 DIAGNOSIS — H40053 Ocular hypertension, bilateral: Secondary | ICD-10-CM

## 2021-07-06 DIAGNOSIS — E113513 Type 2 diabetes mellitus with proliferative diabetic retinopathy with macular edema, bilateral: Secondary | ICD-10-CM

## 2021-07-06 DIAGNOSIS — I10 Essential (primary) hypertension: Secondary | ICD-10-CM

## 2021-07-07 NOTE — Progress Notes (Incomplete)
[facility] Note  07/12/2021     CHIEF COMPLAINT Patient presents for No chief complaint on file.    HISTORY OF PRESENT ILLNESS: Bill [name] is a 41 y.o. male who presents to the clinic today for:    Referring physician: Arlyss Queen, MD [place],  [place] [address]  HISTORICAL INFORMATION:   Selected notes from the MEDICAL RECORD NUMBER Referred by Dr. [name] for concern of severe NPDR / PDR OU   CURRENT MEDICATIONS: No current outpatient medications on file. (Ophthalmic Drugs)   No current facility-administered medications for this visit. (Ophthalmic Drugs)   Current Outpatient Medications (Other)  Medication Sig   aspirin 81 MG EC tablet Take 81 mg by mouth as needed. (Patient not taking: Reported on 02/23/2021)   atorvastatin (LIPITOR) 10 MG tablet TAKE 1 TABLET BY MOUTH EVERY DAY   AURYXIA 1 GM 210 MG(Fe) tablet Take 210 mg by mouth 3 (three) times daily with meals.   Dulaglutide (TRULICITY) 7.06 CB/7.6EG SOPN Inject 0.75 mg into the skin once a week.   ferric citrate (AURYXIA) 1 GM 210 MG(Fe) tablet Take 210 mg by mouth as directed.   fluticasone (FLONASE) 50 MCG/ACT nasal spray Place 1 spray into both nostrils daily as needed for allergies or rhinitis.   Methoxy PEG-Epoetin Beta (MIRCERA IJ) Mircera   multivitamin (RENA-VIT) TABS tablet Take 1 tablet by mouth daily.   sertraline (ZOLOFT) 50 MG tablet TAKE 1 TABLET BY MOUTH EVERY DAY   No current facility-administered medications for this visit. (Other)      REVIEW OF SYSTEMS:       ALLERGIES Allergies  Allergen Reactions   Food     Melons-itchy/headaches    PAST MEDICAL HISTORY Past Medical History:  Diagnosis Date   Anemia    Anxiety    situational   Diabetes mellitus without complication ([place])    Diabetic retinopathy ([place])    PDR OU   ESRD on dialysis ([place])    Dialyzing T-Th-S   History of blood transfusion    Hypertension     PONV (postoperative nausea and vomiting)    03/22/2019- only 15 years ago   Renal disorder    Stroke [facility]) 2019   vision   Past Surgical History:  Procedure Laterality Date   A/V FISTULAGRAM N/A 03/11/2019   Procedure: A/V FISTULAGRAM - Left Arm;  Surgeon: Waynetta Sandy, MD;  Location: [facility];  Service: Cardiovascular;  Laterality: N/A;   arm surgery Right    Fracture   AV FISTULA PLACEMENT Left 01/25/2019   Procedure: ARTERIOVENOUS (AV) FISTULA CREATION  LEFT UPPER  ARM;  Surgeon: Serafina Mitchell, MD;  [facility]: [facility];  Service: Vascular;  Laterality: Left;   BASCILIC VEIN TRANSPOSITION Left 03/27/2019   Procedure: SECOND STAGE BASILIC VEIN TRANSPOSITION LEFT ARM;  Surgeon: Serafina Mitchell, MD;  Location: [facility];  Service: Vascular;  Laterality: Left;   KIDNEY TRANSPLANT  11/05/2020   [name]   WISDOM TOOTH EXTRACTION      FAMILY HISTORY Family History  Problem Relation Age of Onset   Diabetes Mellitus II Mother    Hypertension Mother    Cataracts Mother    Diabetes Mellitus II Father    Stroke Father    Hypertension Father    Glaucoma Maternal Grandmother    Amblyopia Neg Hx    Blindness Neg Hx    Macular degeneration Neg Hx  Retinal detachment Neg Hx    Strabismus Neg Hx    Retinitis pigmentosa Neg Hx     SOCIAL HISTORY Social History   Tobacco Use   Smoking status: Never   Smokeless tobacco: Never  Vaping Use   Vaping Use: Never used  Substance Use Topics   Alcohol use: Not Currently   Drug use: Not Currently         OPHTHALMIC EXAM:  Not recorded    IMAGING AND PROCEDURES  Imaging and Procedures for @TODAY @           ASSESSMENT/PLAN:   ICD-10-CM   1. Proliferative diabetic retinopathy of both eyes with macular edema associated with type 2 diabetes mellitus ([place])  S92.3300     2. Vitreous hemorrhage of both eyes ([place])  H43.13     3. Essential hypertension  I10     4. Hypertensive retinopathy of both  eyes  H35.033     5. Acute CVA (cerebrovascular accident) ([place])  I63.9     6. Ocular hypertension, bilateral  H40.053     7. 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 Pseudophakia  Z96.1       1,2. Proliferative diabetic retinopathy OU  - delayed follow up from 8 weeks to 14 weeks -- was in [place] for the summer  - last A1c was 7,8 on 7.202.22; 7.2 on 01.13.22  - s/p PRP OD (10.02.19), (01.22.20), (02.12.20), (03.08.22)  - s/p PRP OS (05/02/2018), (02.19.20), (2.17.22)  - s/p IVK #1 OD (01.22.20), OS (02.12.20)  - s/p IVA OD #1 (10.21.20), #2 (12.02.20), #3 (01.13.21), #4 (02.10.21), #5 (04.19.21), #6 (05.21.21), #7 (06.21.21), #8 (08.19.21), #9 (10.11.21), #10 (12.01.21), #11 (01.25.22), #12 (03.01.22), #13 (04.05.22), #14 (6.6.22), #15 (09.22.22)  - s/p IVA OS #1 (12.02.20), #2 (04.19.21), #3 (05.21.21), #4 (06.21.21), #5 (08.19.21), #6 (10.11.21), #7 (12.01.21), #8 (01.25.22), #9 (03.01.22), #10 (04.05.22), #11 (6.6.22), #12 (09.22.22)  - FA 8.19.19 shows leaking MA, patches of capillary nonperfusion OU  - repeat FA 10.11.21 shows persistent NV OU, large patches of vascular nonperfusion OU (OS>OD) -- fill in PRP completed OU (OS  2.17.22, OD 3.8.22)  - repeat FA 9.13.22 shows interval improvement in NV/leakage  - BCVA OD: 20/50, OS: 20/60 (stable OU)  - exam shows interval decrease in VH, preretinal and subhyaloid hemorrhage, persistent preretinal fibrosis OU  - OCT shows OD: Persistent PRF and ERM; OS: persistent retinal atrophy, tractional edema nasal to disc  - VH precautions reviewed -- minimize activities, keep head elevated, avoid ASA/NSAIDs/blood thinners as able  - pt on heparin  - discussed possibility of surgery being needed in the future to stabilize eye and prevent further progression of diabetic eye disease  - recommend IVA OU (OD #16 and OS #13) today, 09.22.22 w/ return again in 12 wks  - pt wants wishes to proceed with injections  - RBA of  procedure discussed, questions answered - see procedure note  - Avastin informed consent form signed and scanned on 01.13.2021  - f/u 12 weeks DFE, OCT  3,4. 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 [name] in mid August 2019 with a 2 wk history intractable headaches and decreased vision OU  - found to have BP of 170s/90s -- sent to ED and was admitted to [facility] where SBP was up to 200  - discussed importance of tight BP control   - pt reports increased compliance with BP meds  and improved BP  5. 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 Limited 2 sequence MRI head: Acute RIGHT periatrial subcentimeter  nonhemorrhagic infarct, in region of optic tract.  - could be limiting vision---will check formal VF once macular edema improves  6. Ocular Hypertention OU  - today, IOP OD 15, OS 16  - patient reports not taking cosopt for several months---IOP OK OU  7. Mixed cataract OD - The symptoms of cataract, surgical options, and treatments and risks were discussed with patient. - discussed diagnosis and progression - under the expert management of Dr. Kathlen Mody - clear from a retina standpoint to proceed with cataract surgery when both patient and surgeon are ready  8. Pseudophakia OS  - s/p CE/IOL (07.29.21, Dr. Kathlen Mody)  - IOL in good position, doing well - monitor   Ophthalmic Meds Ordered this visit:  No orders of the defined types were placed in this encounter.     No follow-ups on file.  There are no Patient Instructions on file for this visit.   This document serves as a record of services personally performed by Gardiner Sleeper, MD, PhD. It was created on their behalf by Roselee Nova, COMT. The creation of this record is the provider's dictation and/or activities during the visit.  Electronically signed by: Roselee Nova, COMT 07/07/21 3:07 PM    Gardiner Sleeper, M.D., Ph.D. Diseases & Surgery  of the Retina and Vitreous [facility]: M myopia (nearsighted); A astigmatism; H hyperopia (farsighted); P presbyopia; Mrx spectacle prescription;  CTL contact lenses; OD right eye; OS left eye; OU both eyes  XT exotropia; ET esotropia; PEK punctate epithelial keratitis; PEE punctate epithelial erosions; DES dry eye syndrome; MGD meibomian gland dysfunction; ATs artificial tears; PFAT's preservative free artificial tears; [place] nuclear sclerotic cataract; PSC posterior subcapsular cataract; ERM epi-retinal membrane; PVD posterior vitreous detachment; RD retinal detachment; DM diabetes mellitus; DR diabetic retinopathy; NPDR non-proliferative diabetic retinopathy; PDR proliferative diabetic retinopathy; CSME clinically significant macular edema; DME diabetic macular edema; dbh dot blot hemorrhages; CWS cotton wool spot; POAG primary open angle glaucoma; C/D cup-to-disc ratio; HVF humphrey visual field; GVF goldmann visual field; OCT optical coherence tomography; IOP intraocular pressure; BRVO Branch retinal vein occlusion; CRVO central retinal vein occlusion; CRAO central retinal artery occlusion; BRAO branch retinal artery occlusion; RT retinal tear; SB scleral buckle; PPV pars plana vitrectomy; VH Vitreous hemorrhage; PRP panretinal laser photocoagulation; IVK intravitreal kenalog; VMT vitreomacular traction; MH Macular hole;  NVD neovascularization of the disc; NVE neovascularization elsewhere; AREDS age related eye disease study; ARMD age related macular degeneration; POAG primary open angle glaucoma; EBMD epithelial/anterior basement membrane dystrophy; ACIOL anterior chamber intraocular lens; IOL intraocular lens; PCIOL posterior chamber intraocular lens; Phaco/IOL phacoemulsification with intraocular lens placement; [place] photorefractive keratectomy; LASIK laser assisted in situ keratomileusis; HTN hypertension; DM diabetes mellitus; COPD chronic obstructive  pulmonary disease

## 2021-07-12 ENCOUNTER — Encounter (INDEPENDENT_AMBULATORY_CARE_PROVIDER_SITE_OTHER): Payer: 59 | Admitting: Ophthalmology

## 2021-07-15 NOTE — Progress Notes (Signed)
[facility] Note  07/19/2021     CHIEF COMPLAINT Patient presents for Retina Follow Up    HISTORY OF PRESENT ILLNESS: Bill [name] is a 41 y.o. male who presents to the clinic today for:  HPI     Retina Follow Up   Patient presents with  Diabetic Retinopathy.  In both eyes.  Duration of 4 months.  Since onset it is stable.  I, the attending physician,  performed the HPI with the patient and updated documentation appropriately.        Comments   4 1/2 month follow up PDR OU-  No new problems that he is aware of.  Vision appears about the same.  Patient has been sick and his kids have been sick is the reason why he has not been back for exam. BS 147 yesterday, A1C 8      Last edited by [name], MD on 07/21/2021 12:10 PM.    Pt states no change in vision, he has had a lot of health and family stuff going on so he is delayed to follow up today  Referring physician: Arlyss Queen, MD [place],  [place] [address]  HISTORICAL INFORMATION:   Selected notes from the [facility] Referred by Dr. [name] for concern of severe NPDR / PDR OU   CURRENT MEDICATIONS: No current outpatient medications on file. (Ophthalmic Drugs)   No current facility-administered medications for this visit. (Ophthalmic Drugs)   Current Outpatient Medications (Other)  Medication Sig   atorvastatin (LIPITOR) 10 MG tablet TAKE 1 TABLET BY MOUTH EVERY DAY   AURYXIA 1 GM 210 MG(Fe) tablet Take 210 mg by mouth 3 (three) times daily with meals.   Dulaglutide (TRULICITY) 4.78 GN/5.6OZ SOPN Inject 0.75 mg into the skin once a week.   ferric citrate (AURYXIA) 1 GM 210 MG(Fe) tablet Take 210 mg by mouth as directed.   fluticasone (FLONASE) 50 MCG/ACT nasal spray Place 1 spray into both nostrils daily as needed for allergies or rhinitis.   Methoxy PEG-Epoetin Beta (MIRCERA IJ) Mircera   multivitamin (RENA-VIT) TABS tablet Take 1 tablet by  mouth daily.   sertraline (ZOLOFT) 50 MG tablet TAKE 1 TABLET BY MOUTH EVERY DAY   aspirin 81 MG EC tablet Take 81 mg by mouth as needed. (Patient not taking: Reported on 02/23/2021)   No current facility-administered medications for this visit. (Other)   REVIEW OF SYSTEMS: ROS   Positive for: Neurological, Genitourinary, Endocrine, Cardiovascular, Eyes, Psychiatric Negative for: Constitutional, Gastrointestinal, Skin, Musculoskeletal, HENT, Respiratory, Allergic/Imm, Heme/Lymph Last edited by Leonie Douglas, COA on 07/19/2021  9:09 AM.     ALLERGIES Allergies  Allergen Reactions   Food     Melons-itchy/headaches   PAST MEDICAL HISTORY Past Medical History:  Diagnosis Date   Anemia    Anxiety    situational   Diabetes mellitus without complication ([place])    Diabetic retinopathy ([place])    PDR OU   ESRD on dialysis ([place])    Dialyzing T-Th-S   History of blood transfusion    Hypertension    PONV (postoperative nausea and vomiting)    03/22/2019- only 15 years ago   Renal disorder    Stroke [facility]) 2019   vision   Past Surgical History:  Procedure Laterality Date   A/V FISTULAGRAM N/A 03/11/2019   Procedure: A/V FISTULAGRAM - Left Arm;  Surgeon: Waynetta Sandy, MD;  Location: [facility];  Service:  Cardiovascular;  Laterality: N/A;   arm surgery Right    Fracture   AV FISTULA PLACEMENT Left 01/25/2019   Procedure: ARTERIOVENOUS (AV) FISTULA CREATION  LEFT UPPER  ARM;  Surgeon: Serafina Mitchell, MD;  [facility];  Service: Vascular;  Laterality: Left;   [place] Left 03/27/2019   Procedure: SECOND STAGE BASILIC VEIN TRANSPOSITION LEFT ARM;  Surgeon: Serafina Mitchell, MD;  Location: [facility];  Service: Vascular;  Laterality: Left;   KIDNEY TRANSPLANT  11/05/2020   [facility]   WISDOM TOOTH EXTRACTION      FAMILY HISTORY Family History  Problem Relation Age of Onset   Diabetes Mellitus II Mother    Hypertension Mother    Cataracts  Mother    Diabetes Mellitus II Father    Stroke Father    Hypertension Father    Glaucoma Maternal Grandmother    Amblyopia Neg Hx    Blindness Neg Hx    Macular degeneration Neg Hx    Retinal detachment Neg Hx    Strabismus Neg Hx    Retinitis pigmentosa Neg Hx     SOCIAL HISTORY Social History   Tobacco Use   Smoking status: Never   Smokeless tobacco: Never  Vaping Use   Vaping Use: Never used  Substance Use Topics   Alcohol use: Not Currently   Drug use: Not Currently       OPHTHALMIC EXAM:  Base Eye Exam     Visual Acuity (Snellen - Linear)       Right Left   Dist [place] 20/80 +1 20/100 +2   Dist ph [place] 20/60 NI         Tonometry (Tonopen, 9:18 AM)       Right Left   Pressure 15 17         Pupils       Dark Light Shape React APD   Right 4 3 Round Slow None   Left 4 3 Round Slow None         Visual Fields (Counting fingers)       Left Right    Full Full         Extraocular Movement       Right Left    Full Full         Neuro/Psych     Oriented x3: Yes   Mood/Affect: Normal         Dilation     Both eyes: 1.0% Mydriacyl, 2.5% Phenylephrine @ 9:18 AM           Slit Lamp and Fundus Exam     Slit Lamp Exam       Right Left   Lids/Lashes Normal Normal   Conjunctiva/Sclera White and quiet White and quiet   Cornea Trace Punctate epithelial erosions Trace Punctate epithelial erosions   Anterior Chamber Deep and quiet Deep and quiet   [place]ris Round and dilated, No NVI Round and dilated, no NVI   Lens 1+ Nuclear sclerosis, 1-2+ Cortical cataract, 2+Posterior subcapsular cataract PC IOL in good position   Anterior Vitreous VH improved, scattered fibrosis superior to disc, white blood stained vitreous condensations -- slightly improving, old, white blood clots settled inferiorly Syneresis, white blood stained vitreous condensations settling inferiorly and improving         Fundus Exam       Right Left   Disc 2-3+Pallor, sharp  rim, fibrosis 2-3+Pallor, Sharp rim, Compact, +fibrosis, NVD, no heme   C/D  Ratio 0.1 0.2   Macula Blunted foveal reflex, +fibrosis, +exudates - improved, IRH/MA (improving), ERM with macular pseudo hole like appearance Flat, blunted foveal reflex, atrophic, , +ERM, flat choroidal nevus superior macula   Vessels Severe attenuation, Tortuous, +fibrosis, +NV inferior to disc -- regressing Sclerotic arterioles, Tortuous, +NV -- regressing, fibrosis along superior arcades, severe Vascular attenuation   Periphery Attached, 360 PRP w/ good posterior fill in, tractional fibrosis superior and along arcades, greatest above disc Attached; 360 PRP in place, good posterior fill in, scattered tractional fibrosis           Refraction     Manifest Refraction       Sphere Dist VA   Right     Left [place] NI           IMAGING AND PROCEDURES  Imaging and Procedures for @TODAY @  OCT, Retina - OU - Both Eyes       Right Eye Quality was good. Central Foveal Thickness: 336. Progression has been stable. Findings include abnormal foveal contour, intraretinal hyper-reflective material, preretinal fibrosis, vitreous traction, epiretinal membrane, no SRF, macular pucker, inner retinal atrophy, no IRF (Persistent PRF and ERM, persistent tractional edema superior macula, interval improvement in tractional edema It macla - seen best on widefield, mild interval increase in vitreous opacities ).   Left Eye Quality was good. Central Foveal Thickness: 232. Progression has been stable. Findings include abnormal foveal contour, intraretinal hyper-reflective material, no SRF, inner retinal atrophy, epiretinal membrane, preretinal fibrosis, outer retinal atrophy, vitreomacular adhesion , no IRF, vitreous traction (persistent retinal atrophy, tractional edema nasal to disc).   Notes *Images captured and stored on drive  Diagnosis / Impression:  PDR w/ tractional fibrosis OU OD: Persistent PRF and ERM, persistent  tractional edema superior macula, interval improvement in tractional edema IT macula - seen best on widefield, mild interval increase in vitreous opacities OS: persistent retinal atrophy, tractional edema nasal to disc  Clinical management:  See below  Abbreviations: NFP - Normal foveal profile. CME - cystoid macular edema. PED - pigment epithelial detachment. IRF - intraretinal fluid. SRF - subretinal fluid. EZ - ellipsoid zone. ERM - epiretinal membrane. ORA - outer retinal atrophy. ORT - outer retinal tubulation. SRHM - subretinal hyper-reflective material               ASSESSMENT/PLAN:   ICD-10-CM   1. Proliferative diabetic retinopathy of both eyes with macular edema associated with type 2 diabetes mellitus ([place])  E11.3513 OCT, Retina - OU - Both Eyes    2. Vitreous hemorrhage of both eyes ([place])  H43.13     3. Essential hypertension  I10     4. Hypertensive retinopathy of both eyes  H35.033     5. Acute CVA (cerebrovascular accident) ([place])  I63.9     6. Ocular hypertension, bilateral  H40.053     7. 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 Pseudophakia  Z96.1       1,2. Proliferative diabetic retinopathy OU  - delayed follow up from 12 weeks to 4+ mos  - last A1c was 9.6 on 11.29.22; 7.8 on 7.20.22; 7.2 on 01.13.22  - s/p PRP OD (10.02.19), (01.22.20), (02.12.20), (03.08.22)  - s/p PRP OS (05/02/2018), (02.19.20), (2.17.22)  - s/p IVK #1 OD (01.22.20), OS (02.12.20)  - s/p IVA OD #1 (10.21.20), #2 (12.02.20), #3 (01.13.21), #4 (02.10.21), #5 (04.19.21), #6 (05.21.21), #7 (06.21.21), #8 (08.19.21), #9 (10.11.21), #10 (12.01.21), #11 (01.25.22), #12 (  03.01.22), #13 (04.05.22), #14 (6.6.22)  - s/p IVA OS #1 (12.02.20), #2 (04.19.21), #3 (05.21.21), #4 (06.21.21), #5 (08.19.21), #6 (10.11.21), #7 (12.01.21), #8 (01.25.22), #9 (03.01.22), #10 (04.05.22), #11 (6.6.22)  - FA 8.19.19 shows leaking MA, patches of capillary nonperfusion OU  - repeat  FA 10.11.21 shows persistent NV OU, large patches of vascular nonperfusion OU (OS>OD) -- fill in PRP completed OU (OS  2.17.22, OD 3.8.22)  - repeat FA 9.13.22 shows interval improvement in NV/leakage  - BCVA OD: 20/60, OS: 20/100 (dec'd OU)  - exam shows interval decrease in VH, preretinal and subhyaloid hemorrhage, persistent preretinal fibrosis OU  - OCT shows OD: Persistent PRF and ERM, persistent tractional edema superior macula, interval improvement in tractional edema IT macula - seen best on widefield, mild interval increase in vitreous opacities; OS: persistent retinal atrophy, tractional edema nasal to disc  - pt on heparin  - discussed possibility of surgery being needed in the future to stabilize eye and prevent further progression of diabetic eye disease  - recommend IVA OU (OD #15 and OS #12) today, 09.22.22 w/ return again in 12 wks  - pt is unable to have injections today due to another appt  - Avastin informed consent form signed and scanned on 01.13.2021  - f/u Friday at 845 DFE, OCT, IVA OU  3,4. 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 [name] in mid August 2019 with a 2 wk history intractable headaches and decreased vision OU  - found to have BP of 170s/90s -- sent to ED and was admitted to [facility] where SBP was up to 200  - discussed importance of tight BP control   - pt reports improved compliance with BP meds and BP readings have improved  5. 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 Limited 2 sequence MRI head: Acute RIGHT periatrial subcentimeter  nonhemorrhagic infarct, in region of optic tract.  - could be limiting vision--will check formal VF once macular edema improves  6. Ocular Hypertention OU  - today, IOP OD 15, OS 17  - patient reports not taking cosopt for several months--IOP OK OU  7. Mixed cataract OD - The symptoms of cataract, surgical options, and treatments and risks  were discussed with patient. - discussed diagnosis and progression - under the expert management of Dr. Kathlen Mody - clear from a retina standpoint to proceed with cataract surgery when both patient and surgeon are ready  8. Pseudophakia OS  - s/p CE/IOL (07.29.21, Dr. Kathlen Mody)  - IOL in good position, doing well - monitor   Ophthalmic Meds Ordered this visit:  No orders of the defined types were placed in this encounter.     Return for f/u Friday at 845, NPDR OU, DFE, OCT.  There are no Patient Instructions on file for this visit.   This document serves as a record of services personally performed by Gardiner Sleeper, MD, PhD. It was created on their behalf by Roselee Nova, COMT. The creation of this record is the provider's dictation and/or activities during the visit.  Electronically signed by: Roselee Nova, COMT 07/21/21 12:16 PM  Gardiner Sleeper, M.D., Ph.D. Diseases & Surgery of the Retina and Vitreous [facility]  I have reviewed the above documentation for accuracy and completeness, and I agree with the above. Gardiner Sleeper, M.D., Ph.D. 07/21/21 12:16 PM   Abbreviations: M myopia (nearsighted); A astigmatism; H  hyperopia (farsighted); P presbyopia; Mrx spectacle prescription;  CTL contact lenses; OD right eye; OS left eye; OU both eyes  XT exotropia; ET esotropia; PEK punctate epithelial keratitis; PEE punctate epithelial erosions; DES dry eye syndrome; MGD meibomian gland dysfunction; ATs artificial tears; PFAT's preservative free artificial tears; [place] nuclear sclerotic cataract; PSC posterior subcapsular cataract; ERM epi-retinal membrane; PVD posterior vitreous detachment; RD retinal detachment; DM diabetes mellitus; DR diabetic retinopathy; NPDR non-proliferative diabetic retinopathy; PDR proliferative diabetic retinopathy; CSME clinically significant macular edema; DME diabetic macular edema; dbh dot blot hemorrhages; CWS cotton wool spot; POAG primary  open angle glaucoma; C/D cup-to-disc ratio; HVF humphrey visual field; GVF goldmann visual field; OCT optical coherence tomography; IOP intraocular pressure; BRVO Branch retinal vein occlusion; CRVO central retinal vein occlusion; CRAO central retinal artery occlusion; BRAO branch retinal artery occlusion; RT retinal tear; SB scleral buckle; PPV pars plana vitrectomy; VH Vitreous hemorrhage; PRP panretinal laser photocoagulation; IVK intravitreal kenalog; VMT vitreomacular traction; MH Macular hole;  NVD neovascularization of the disc; NVE neovascularization elsewhere; AREDS age related eye disease study; ARMD age related macular degeneration; POAG primary open angle glaucoma; EBMD epithelial/anterior basement membrane dystrophy; ACIOL anterior chamber intraocular lens; IOL intraocular lens; PCIOL posterior chamber intraocular lens; Phaco/IOL phacoemulsification with intraocular lens placement; [place] photorefractive keratectomy; LASIK laser assisted in situ keratomileusis; HTN hypertension; DM diabetes mellitus; COPD chronic obstructive pulmonary disease

## 2021-07-19 ENCOUNTER — Ambulatory Visit (INDEPENDENT_AMBULATORY_CARE_PROVIDER_SITE_OTHER): Payer: 59 | Admitting: Ophthalmology

## 2021-07-19 ENCOUNTER — Encounter (INDEPENDENT_AMBULATORY_CARE_PROVIDER_SITE_OTHER): Payer: Self-pay | Admitting: Ophthalmology

## 2021-07-19 ENCOUNTER — Encounter (INDEPENDENT_AMBULATORY_CARE_PROVIDER_SITE_OTHER): Payer: 59 | Admitting: Ophthalmology

## 2021-07-19 ENCOUNTER — Other Ambulatory Visit: Payer: Self-pay

## 2021-07-19 DIAGNOSIS — I639 Cerebral infarction, unspecified: Secondary | ICD-10-CM

## 2021-07-19 DIAGNOSIS — H25811 Combined forms of age-related cataract, right eye: Secondary | ICD-10-CM

## 2021-07-19 DIAGNOSIS — H4313 Vitreous hemorrhage, bilateral: Secondary | ICD-10-CM

## 2021-07-19 DIAGNOSIS — Z961 Presence of intraocular lens: Secondary | ICD-10-CM

## 2021-07-19 DIAGNOSIS — I10 Essential (primary) hypertension: Secondary | ICD-10-CM | POA: Diagnosis not present

## 2021-07-19 DIAGNOSIS — E113513 Type 2 diabetes mellitus with proliferative diabetic retinopathy with macular edema, bilateral: Secondary | ICD-10-CM

## 2021-07-19 DIAGNOSIS — H35033 Hypertensive retinopathy, bilateral: Secondary | ICD-10-CM | POA: Diagnosis not present

## 2021-07-19 DIAGNOSIS — H40053 Ocular hypertension, bilateral: Secondary | ICD-10-CM

## 2021-07-21 ENCOUNTER — Encounter (INDEPENDENT_AMBULATORY_CARE_PROVIDER_SITE_OTHER): Payer: Self-pay | Admitting: Ophthalmology

## 2021-07-23 ENCOUNTER — Encounter (INDEPENDENT_AMBULATORY_CARE_PROVIDER_SITE_OTHER): Payer: 59 | Admitting: Ophthalmology

## 2021-08-31 ENCOUNTER — Encounter (HOSPITAL_COMMUNITY): Payer: Self-pay

## 2021-09-03 NOTE — Progress Notes (Addendum)
?[facility] Note ? ?09/07/2021 ? ?  ? ?CHIEF COMPLAINT ?Patient presents for Retina Follow Up ? ? ? ?HISTORY OF PRESENT ILLNESS: ?Bill [name] is a 41 y.o. male who presents to the clinic today for:  ?HPI   ? ? Retina Follow Up   ?Patient presents with  Diabetic Retinopathy.  In both eyes.  This started 7 weeks ago.  I, the attending physician,  performed the HPI with the patient and updated documentation appropriately. ? ?  ?  ? ? Comments   ?Patient here for (7 weeks) retina follow up for PDR OU. Patient states vision about the same. No eye pain.  ? ?  ?  ?Last edited by [name], MD on 09/07/2021  1:35 PM.  ?  ?Pt states vision is stable, no new health concerns ? ?Referring physician: ?Lisabeth Pick, MD ?[address] ?[place],  [place] [id number] ? ?HISTORICAL INFORMATION:  ? ?Selected notes from the [facility] ?Referred by Dr. [name] for concern of severe NPDR / PDR OU  ? ?CURRENT MEDICATIONS: ?No current outpatient medications on file. (Ophthalmic Drugs)  ? ?No current facility-administered medications for this visit. (Ophthalmic Drugs)  ? ?Current Outpatient Medications (Other)  ?Medication Sig  ? atorvastatin (LIPITOR) 10 MG tablet TAKE 1 TABLET BY MOUTH EVERY DAY  ? AURYXIA 1 GM 210 MG(Fe) tablet Take 210 mg by mouth 3 (three) times daily with meals.  ? Dulaglutide (TRULICITY) 9.62 EZ/6.6QH SOPN Inject 0.75 mg into the skin once a week.  ? ferric citrate (AURYXIA) 1 GM 210 MG(Fe) tablet Take 210 mg by mouth as directed.  ? fluticasone (FLONASE) 50 MCG/ACT nasal spray Place 1 spray into both nostrils daily as needed for allergies or rhinitis.  ? Methoxy PEG-Epoetin Beta (MIRCERA IJ) Mircera  ? multivitamin (RENA-VIT) TABS tablet Take 1 tablet by mouth daily.  ? sertraline (ZOLOFT) 50 MG tablet TAKE 1 TABLET BY MOUTH EVERY DAY  ? aspirin 81 MG EC tablet Take 81 mg by mouth as needed. (Patient not taking: Reported on 02/23/2021)  ? ?No current  facility-administered medications for this visit. (Other)  ? ?REVIEW OF SYSTEMS: ?ROS   ?Positive for: Neurological, Genitourinary, Endocrine, Cardiovascular, Eyes, Psychiatric ?Negative for: Constitutional, Gastrointestinal, Skin, Musculoskeletal, HENT, Respiratory, Allergic/Imm, Heme/Lymph ?Last edited by [name], COA on 09/07/2021  9:44 AM.  ?  ? ?ALLERGIES ?Allergies  ?Allergen Reactions  ? Food   ?  Melons-itchy/headaches  ? ?PAST MEDICAL HISTORY ?Past Medical History:  ?Diagnosis Date  ? Anemia   ? Anxiety   ? situational  ? Diabetes mellitus without complication ([place])   ? Diabetic retinopathy ([place])   ? PDR OU  ? ESRD on dialysis [facility])   ? Dialyzing T-Th-S  ? History of blood transfusion   ? Hypertension   ? PONV (postoperative nausea and vomiting)   ? 03/22/2019- only 15 years ago  ? Renal disorder   ? Stroke [facility]) 2019  ? vision  ? ?Past Surgical History:  ?Procedure Laterality Date  ? A/V FISTULAGRAM N/A 03/11/2019  ? Procedure: A/V FISTULAGRAM - Left Arm;  Surgeon: Waynetta Sandy, MD;  Location: [facility];  Service: Cardiovascular;  Laterality: N/A;  ? arm surgery Right   ? Fracture  ? AV FISTULA PLACEMENT Left 01/25/2019  ? Procedure: ARTERIOVENOUS (AV) FISTULA CREATION  LEFT UPPER  ARM;  Surgeon: Serafina Mitchell, MD;  Location: [facility];  Service: Vascular;  Laterality: Left;  ?  BASCILIC VEIN TRANSPOSITION Left 03/27/2019  ? Procedure: SECOND STAGE BASILIC VEIN TRANSPOSITION LEFT ARM;  Surgeon: Serafina Mitchell, MD;  Location: [facility];  Service: Vascular;  Laterality: Left;  ? KIDNEY TRANSPLANT  11/05/2020  ? [place] EXTRACTION    ? ?FAMILY HISTORY ?Family History  ?Problem Relation Age of Onset  ? Diabetes Mellitus II Mother   ? Hypertension Mother   ? Cataracts Mother   ? Diabetes Mellitus II Father   ? Stroke Father   ? Hypertension Father   ? Glaucoma Maternal Grandmother   ? Amblyopia Neg Hx   ? Blindness Neg Hx   ? Macular degeneration Neg Hx   ?  Retinal detachment Neg Hx   ? Strabismus Neg Hx   ? Retinitis pigmentosa Neg Hx   ? ?SOCIAL HISTORY ?Social History  ? ?Tobacco Use  ? Smoking status: Never  ? Smokeless tobacco: Never  ?Vaping Use  ? Vaping Use: Never used  ?Substance Use Topics  ? Alcohol use: Not Currently  ? Drug use: Not Currently  ?  ? ?  ?OPHTHALMIC EXAM: ? ?Base Eye Exam   ? ? Visual Acuity (Snellen - Linear)   ? ?   Right Left  ? Dist [place] 20/80 -2 20/250 -1  ? Dist ph [place] 20/40 -1 20/100 -1  ? ?  ?  ? ? Tonometry (Tonopen, 9:40 AM)   ? ?   Right Left  ? Pressure 14 15  ? ?  ?  ? ? Pupils   ? ?   Dark Light Shape React APD  ? Right 4 3 Round Brisk None  ? Left 4 3 Round Brisk None  ? ?  ?  ? ? Visual Fields (Counting fingers)   ? ?   Left Right  ?  Full Full  ? ?  ?  ? ? Extraocular Movement   ? ?   Right Left  ?  Full, Ortho Full, Ortho  ? ?  ?  ? ? Neuro/Psych   ? ? Oriented x3: Yes  ? Mood/Affect: Normal  ? ?  ?  ? ? Dilation   ? ? Both eyes: 1.0% Mydriacyl, 2.5% Phenylephrine @ 9:40 AM  ? ?  ?  ? ?  ? ?Slit Lamp and Fundus Exam   ? ? Slit Lamp Exam   ? ?   Right Left  ? Lids/Lashes Normal Normal  ? Conjunctiva/Sclera White and quiet White and quiet  ? Cornea Trace Punctate epithelial erosions Trace Punctate epithelial erosions  ? Anterior Chamber Deep and quiet Deep and quiet  ? Iris Round and dilated, No NVI Round and dilated, no NVI  ? Lens 1+ Nuclear sclerosis, 1-2+ Cortical cataract, 2+Posterior subcapsular cataract PC IOL in good position  ? Anterior Vitreous scattered fibrosis superior to disc, white blood stained vitreous condensations -- persistent, old, white blood clots settled inferiorly Syneresis, white blood stained vitreous condensations settling inferiorly and improving  ? ?  ?  ? ? Fundus Exam   ? ?   Right Left  ? Disc 2-3+Pallor, sharp rim, fibrosis, attenuated disc vessels 2-3+Pallor, Sharp rim, Compact, +fibrosis, NVD - regressed, no heme  ? C/D Ratio 0.1 0.2  ? Macula Blunted foveal reflex, +fibrosis, +exudates -  improved, IRH/MA (improving), ERM with macular pseudo hole like appearance Flat, blunted foveal reflex, atrophic, +ERM, flat choroidal nevus superior macula  ? Vessels Severe attenuation, Tortuous, +fibrosis, +NV inferior to disc -- regressing  Sclerotic arterioles, Tortuous, +NV -- regressing, fibrosis along superior arcades, severe Vascular attenuation  ? Periphery Attached, 360 PRP w/ good posterior fill in, tractional fibrosis superior and along arcades, greatest above disc Attached; 360 PRP in place, good posterior fill in, scattered tractional fibrosis  ? ?  ?  ? ?  ? ?IMAGING AND PROCEDURES  ?Imaging and Procedures for @TODAY @ ? ?OCT, Retina - OU - Both Eyes   ? ?   ?Right Eye ?Quality was good. Central Foveal Thickness: 339. Progression has improved. Findings include abnormal foveal contour, intraretinal hyper-reflective material, preretinal fibrosis, vitreous traction, epiretinal membrane, no SRF, macular pucker, inner retinal atrophy, no IRF (Persistent PRF and ERM, persistent tractional edema superior macula, interval release of vitreous traction along IT arcades, persistent vitreous opacities ).  ? ?Left Eye ?Quality was good. Central Foveal Thickness: 232. Progression has been stable. Findings include abnormal foveal contour, intraretinal hyper-reflective material, no SRF, inner retinal atrophy, epiretinal membrane, preretinal fibrosis, outer retinal atrophy, vitreomacular adhesion , no IRF, vitreous traction (persistent retinal atrophy, tractional edema nasal to disc).  ? ?Notes ?*Images captured and stored on drive ? ?Diagnosis / Impression:  ?PDR w/ tractional fibrosis OU ?OD: Persistent PRF and ERM, persistent tractional edema superior macula, interval release of vitreous traction along IT arcades, persistent vitreous opacities  ?OS: persistent retinal atrophy, tractional edema nasal to disc ? ?Clinical management:  ?See below ? ?Abbreviations: NFP - Normal foveal profile. CME - cystoid macular  edema. PED - pigment epithelial detachment. IRF - intraretinal fluid. SRF - subretinal fluid. EZ - ellipsoid zone. ERM - epiretinal membrane. ORA - outer retinal atrophy. ORT - outer retinal tubulation. SRHM - subretinal hyper-ref

## 2021-09-07 ENCOUNTER — Encounter (INDEPENDENT_AMBULATORY_CARE_PROVIDER_SITE_OTHER): Payer: Self-pay | Admitting: Ophthalmology

## 2021-09-07 ENCOUNTER — Other Ambulatory Visit: Payer: Self-pay

## 2021-09-07 ENCOUNTER — Encounter (HOSPITAL_COMMUNITY): Payer: Self-pay

## 2021-09-07 ENCOUNTER — Ambulatory Visit (INDEPENDENT_AMBULATORY_CARE_PROVIDER_SITE_OTHER): Payer: Medicare Other | Admitting: Ophthalmology

## 2021-09-07 DIAGNOSIS — Z961 Presence of intraocular lens: Secondary | ICD-10-CM

## 2021-09-07 DIAGNOSIS — E113513 Type 2 diabetes mellitus with proliferative diabetic retinopathy with macular edema, bilateral: Secondary | ICD-10-CM

## 2021-09-07 DIAGNOSIS — H4313 Vitreous hemorrhage, bilateral: Secondary | ICD-10-CM | POA: Diagnosis not present

## 2021-09-07 DIAGNOSIS — I10 Essential (primary) hypertension: Secondary | ICD-10-CM

## 2021-09-07 DIAGNOSIS — I639 Cerebral infarction, unspecified: Secondary | ICD-10-CM

## 2021-09-07 DIAGNOSIS — H25811 Combined forms of age-related cataract, right eye: Secondary | ICD-10-CM

## 2021-09-07 DIAGNOSIS — H40053 Ocular hypertension, bilateral: Secondary | ICD-10-CM

## 2021-09-07 DIAGNOSIS — H35033 Hypertensive retinopathy, bilateral: Secondary | ICD-10-CM | POA: Diagnosis not present

## 2021-09-07 MED ORDER — BEVACIZUMAB CHEMO INJECTION 1.25MG/0.05ML SYRINGE FOR KALEIDOSCOPE
1.2500 mg | INTRAVITREAL | Status: AC | PRN
  Administered 2021-09-07: 1.25 mg via INTRAVITREAL

## 2021-12-08 ENCOUNTER — Encounter (INDEPENDENT_AMBULATORY_CARE_PROVIDER_SITE_OTHER): Payer: Medicare Other | Admitting: Ophthalmology

## 2021-12-08 DIAGNOSIS — H35033 Hypertensive retinopathy, bilateral: Secondary | ICD-10-CM

## 2021-12-08 DIAGNOSIS — I639 Cerebral infarction, unspecified: Secondary | ICD-10-CM

## 2021-12-08 DIAGNOSIS — E113513 Type 2 diabetes mellitus with proliferative diabetic retinopathy with macular edema, bilateral: Secondary | ICD-10-CM

## 2021-12-08 DIAGNOSIS — I10 Essential (primary) hypertension: Secondary | ICD-10-CM

## 2021-12-08 DIAGNOSIS — H25811 Combined forms of age-related cataract, right eye: Secondary | ICD-10-CM

## 2021-12-08 DIAGNOSIS — H40053 Ocular hypertension, bilateral: Secondary | ICD-10-CM

## 2021-12-08 DIAGNOSIS — Z961 Presence of intraocular lens: Secondary | ICD-10-CM

## 2021-12-08 DIAGNOSIS — H4313 Vitreous hemorrhage, bilateral: Secondary | ICD-10-CM

## 2022-01-11 ENCOUNTER — Encounter (INDEPENDENT_AMBULATORY_CARE_PROVIDER_SITE_OTHER): Payer: Medicare Other | Admitting: Ophthalmology

## 2022-01-13 NOTE — Progress Notes (Signed)
[facility] Note  01/14/2022     CHIEF COMPLAINT Patient presents for Retina Follow Up   HISTORY OF PRESENT ILLNESS: Bill [name] is a 41 y.o. male who presents to the clinic today for:  HPI     Retina Follow Up   Patient presents with  Diabetic Retinopathy.  In both eyes.  This started 3 months ago.  I, the attending physician,  performed the HPI with the patient and updated documentation appropriately.        Comments   Patient here for 3 months (4 months) for retina follow up for PDR OU. Patient states vision a little blurry. Can tell it is time for a treatment. No eye pain.       Last edited by [name], MD on 01/15/2022  1:39 AM.     Pt states he can tell it's time for treatment on his right eye bc his vision is a little blurry, pt scheduled on appt with Dr. Lucianne Lei to have his cataract in the right eye removed, but will not be seen until October  Referring physician: Isaac Bliss, Rayford Halsted, MD [place],  [place] [id number]  HISTORICAL INFORMATION:   Selected notes from the MEDICAL RECORD NUMBER Referred by Dr. [name] for concern of severe NPDR / PDR OU   CURRENT MEDICATIONS: No current outpatient medications on file. (Ophthalmic Drugs)   No current facility-administered medications for this visit. (Ophthalmic Drugs)   Current Outpatient Medications (Other)  Medication Sig   atorvastatin (LIPITOR) 10 MG tablet TAKE 1 TABLET BY MOUTH EVERY DAY   AURYXIA 1 GM 210 MG(Fe) tablet Take 210 mg by mouth 3 (three) times daily with meals.   Dulaglutide (TRULICITY) 0.03 KJ/1.7HX SOPN Inject 0.75 mg into the skin once a week.   ferric citrate (AURYXIA) 1 GM 210 MG(Fe) tablet Take 210 mg by mouth as directed.   fluticasone (FLONASE) 50 MCG/ACT nasal spray Place 1 spray into both nostrils daily as needed for allergies or rhinitis.   multivitamin (RENA-VIT) TABS tablet Take 1 tablet by mouth daily.   sertraline (ZOLOFT)  50 MG tablet TAKE 1 TABLET BY MOUTH EVERY DAY   aspirin 81 MG EC tablet Take 81 mg by mouth as needed. (Patient not taking: Reported on 02/23/2021)   No current facility-administered medications for this visit. (Other)   REVIEW OF SYSTEMS: ROS   Positive for: Neurological, Genitourinary, Endocrine, Cardiovascular, Eyes, Psychiatric Negative for: Constitutional, Gastrointestinal, Skin, Musculoskeletal, HENT, Respiratory, Allergic/Imm, Heme/Lymph Last edited by [name], COA on 01/14/2022  9:27 AM.     ALLERGIES Allergies  Allergen Reactions   Food     Melons-itchy/headaches   PAST MEDICAL HISTORY Past Medical History:  Diagnosis Date   Anemia    Anxiety    situational   Diabetes mellitus without complication ([place])    Diabetic retinopathy ([place])    PDR OU   ESRD on dialysis ([place])    Dialyzing T-Th-S   History of blood transfusion    Hypertension    PONV (postoperative nausea and vomiting)    03/22/2019- only 15 years ago   Renal disorder    Stroke [facility]) 2019   vision   Past Surgical History:  Procedure Laterality Date   A/V FISTULAGRAM N/A 03/11/2019   Procedure: A/V FISTULAGRAM - Left Arm;  Surgeon: Waynetta Sandy, MD;  Location: [facility];  Service: Cardiovascular;  Laterality: N/A;   arm surgery  Right    Fracture   AV FISTULA PLACEMENT Left 01/25/2019   Procedure: ARTERIOVENOUS (AV) FISTULA CREATION  LEFT UPPER  ARM;  Surgeon: Serafina Mitchell, MD;  [facility];  Service: Vascular;  Laterality: Left;   [place] Left 03/27/2019   Procedure: SECOND STAGE BASILIC VEIN TRANSPOSITION LEFT ARM;  Surgeon: Serafina Mitchell, MD;  Location: [facility];  Service: Vascular;  Laterality: Left;   KIDNEY TRANSPLANT  11/05/2020   [facility]   WISDOM TOOTH EXTRACTION     FAMILY HISTORY Family History  Problem Relation Age of Onset   Diabetes Mellitus II Mother    Hypertension Mother    Cataracts Mother    Diabetes Mellitus II  Father    Stroke Father    Hypertension Father    Glaucoma Maternal Grandmother    Amblyopia Neg Hx    Blindness Neg Hx    Macular degeneration Neg Hx    Retinal detachment Neg Hx    Strabismus Neg Hx    Retinitis pigmentosa Neg Hx    SOCIAL HISTORY Social History   Tobacco Use   Smoking status: Never   Smokeless tobacco: Never  Vaping Use   Vaping Use: Never used  Substance Use Topics   Alcohol use: Not Currently   Drug use: Not Currently       OPHTHALMIC EXAM:  Base Eye Exam     Visual Acuity (Snellen - Linear)       Right Left   Dist [place] 20/80 -1 20/200 -1   Dist ph [place] 20/40 -1 20/60 -1         Tonometry (Tonopen, 9:24 AM)       Right Left   Pressure 16 17         Pupils       Dark Light Shape React APD   Right 4 3 Round Brisk None   Left 4 3 Round Brisk None         Visual Fields (Counting fingers)       Left Right    Full Full         Extraocular Movement       Right Left    Full, Ortho Full, Ortho         Neuro/Psych     Oriented x3: Yes   Mood/Affect: Normal         Dilation     Both eyes: 1.0% Mydriacyl, 2.5% Phenylephrine @ 9:24 AM           Slit Lamp and Fundus Exam     Slit Lamp Exam       Right Left   Lids/Lashes Normal Normal   Conjunctiva/Sclera White and quiet White and quiet   [place] Trace Punctate epithelial erosions Trace Punctate epithelial erosions   Anterior Chamber Deep and quiet Deep and quiet   Iris Round and dilated, No NVI Round and dilated, no NVI   Lens 1+ Nuclear sclerosis, 1-2+ Cortical cataract, 2+Posterior subcapsular cataract PC IOL in good position   Anterior Vitreous scattered fibrosis superior to disc, white blood stained vitreous condensations -- persistent, old, white blood clots settled inferiorly Syneresis, white blood stained vitreous condensations settling inferiorly and improving         Fundus Exam       Right Left   Disc 2-3+Pallor, sharp rim, fibrosis, attenuated  disc vessels 2-3+Pallor, Sharp rim, Compact, +fibrosis, NVD - regressed, no heme   C/D Ratio 0.1 0.2  Macula Blunted foveal reflex, +fibrosis, +exudates - improved, IRH/MA (improving), ERM with macular pseudo hole like appearance Flat, blunted foveal reflex, atrophic, +ERM, flat choroidal nevus superior macula   Vessels Severe attenuation, Tortuous, +fibrosis, +NV inferior to disc -- regressing Sclerotic arterioles, Tortuous, +NV -- regressing, fibrosis along superior arcades, severe Vascular attenuation   Periphery Attached, 360 PRP w/ good posterior fill in, tractional fibrosis superior and along arcades, greatest above disc Attached; 360 PRP in place, good posterior fill in, scattered tractional fibrosis           IMAGING AND PROCEDURES  Imaging and Procedures for @TODAY @  OCT, Retina - OU - Both Eyes       Right Eye Quality was good. Central Foveal Thickness: 336. Progression has been stable. Findings include no IRF, no SRF, abnormal foveal contour, intraretinal hyper-reflective material, epiretinal membrane, macular pucker, vitreous traction, inner retinal atrophy, preretinal fibrosis (Persistent PRF and ERM, persistent tractional edema superior macula, persistent vitreous opacities ).   Left Eye Quality was good. Central Foveal Thickness: 229. Progression has been stable. Findings include no IRF, no SRF, abnormal foveal contour, intraretinal hyper-reflective material, epiretinal membrane, vitreous traction, inner retinal atrophy, outer retinal atrophy, vitreomacular adhesion , preretinal fibrosis (persistent retinal atrophy, tractional edema nasal to disc).   Notes *Images captured and stored on drive  Diagnosis / Impression:  PDR w/ tractional fibrosis OU OD: Persistent PRF and ERM, persistent tractional edema superior macula, persistent vitreous opacities  OS: persistent retinal atrophy, tractional edema nasal to disc  Clinical management:  See below  Abbreviations: NFP -  Normal foveal profile. CME - cystoid macular edema. PED - pigment epithelial detachment. IRF - intraretinal fluid. SRF - subretinal fluid. EZ - ellipsoid zone. ERM - epiretinal membrane. ORA - outer retinal atrophy. ORT - outer retinal tubulation. SRHM - subretinal hyper-reflective material       Intravitreal Injection, Pharmacologic Agent - OD - Right Eye       Time Out 01/14/2022. 10:13 AM. Confirmed correct patient, procedure, site, and patient consented.   Anesthesia Topical anesthesia was used. Anesthetic medications included Lidocaine 2%, Proparacaine 0.5%.   Procedure Preparation included 5% betadine to ocular surface, eyelid speculum. A (32g) needle was used.   Injection: 1.25 mg Bevacizumab 1.25mg /0.48ml   Route: Intravitreal, Site: Right Eye   NDC: H061816, Lot: [id number], Expiration date: 04/15/2022   Post-op Post injection exam found visual acuity of at least counting fingers. The patient tolerated the procedure well. There were no complications. The patient received written and verbal post procedure care education. Post injection medications were not given.            ASSESSMENT/PLAN:   ICD-10-CM   1. Proliferative diabetic retinopathy of both eyes with macular edema associated with type 2 diabetes mellitus (HCC)  E11.3513 OCT, Retina - OU - Both Eyes    Intravitreal Injection, Pharmacologic Agent - OD - Right Eye    Bevacizumab (AVASTIN) SOLN 1.25 mg    2. Vitreous hemorrhage of both eyes ([place])  H43.13     3. Essential hypertension  I10     4. Hypertensive retinopathy of both eyes  H35.033     5. Acute CVA (cerebrovascular accident) ([place])  I63.9     6. Ocular hypertension, bilateral  H40.053     7. 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 Pseudophakia  Z96.1      1,2. Proliferative diabetic retinopathy OU  - delayed follow up --  4 mos instead of 3  - last A1c was 11.9 on 05.11.23; 9.6 on 11.29.22; 7.8 on 7.20.22; 7.2  on 01.13.22  - s/p PRP OD (10.02.19), (01.22.20), (02.12.20), (03.08.22)  - s/p PRP OS (05/02/2018), (02.19.20), (2.17.22)  - s/p IVK #1 OD (01.22.20), OS (02.12.20)  - s/p IVA OD #1 (10.21.20), #2 (12.02.20), #3 (01.13.21), #4 (02.10.21), #5 (04.19.21), #6 (05.21.21), #7 (06.21.21), #8 (08.19.21), #9 (10.11.21), #10 (12.01.21), #11 (01.25.22), #12 (03.01.22), #13 (04.05.22), #14 (6.6.22), #15 (09.22.22), #16 (03.28.23)  - s/p IVA OS #1 (12.02.20), #2 (04.19.21), #3 (05.21.21), #4 (06.21.21), #5 (08.19.21), #6 (10.11.21), #7 (12.01.21), #8 (01.25.22), #9 (03.01.22), #10 (04.05.22), #11 (6.6.22), #12 (09.22.22)   - FA 8.19.19 shows leaking MA, patches of capillary nonperfusion OU  - repeat FA 10.11.21 shows persistent NV OU, large patches of vascular nonperfusion OU (OS>OD) -- fill in PRP completed OU (OS  2.17.22, OD 3.8.22)  - repeat FA 9.13.22 shows interval improvement in NV/leakage  - BCVA OD: 20/40 - improved, OS: 20/100  - exam shows stable improvement in VH, preretinal and subhyaloid hemorrhage, persistent preretinal fibrosis OU  - OCT shows OD: Persistent PRF and ERM, persistent tractional edema superior macula, interval release of vitreous traction along IT arcades, persistent vitreous opacities; OS: persistent retinal atrophy, tractional edema nasal to disc  - pt on heparin  - discussed possibility of surgery being needed in the future to stabilize eye and prevent further progression of diabetic eye disease - would recommend cataract surgery first  - recommend IVA OD #17 today, 08.04.23 w/ return again in ~3 months  - will hold off on left eye injection today  - pt wishes to proceed with injection OD  - RBA of procedure discussed, questions answered - informed consent obtained and signed - see procedure note - Avastin informed consent form signed and scanned on 01.13.2021  - f/u 12-14 weeks DFE, OCT, possible injection(s)  3,4. Severe hypertensive retinopathy w/ macular edema OU  -  presented to Dr. [name] in mid August 2019 with a 2 wk history intractable headaches and decreased vision OU  - found to have BP of 170s/90s -- sent to ED and was admitted to [facility] where SBP was up to 200  - discussed importance of tight BP control   - pt reports improved compliance with BP meds and BP readings have improved  5. 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 Limited 2 sequence MRI head: Acute RIGHT periatrial subcentimeter  nonhemorrhagic infarct, in region of optic tract.  - could be limiting vision--will check formal VF once macular edema improves  6. Ocular Hypertention OU  - today, IOP 14,15  - patient reports not taking cosopt for several months--IOP ok OU  7. Mixed cataract OD - The symptoms of cataract, surgical options, and treatments and risks were discussed with patient. - discussed diagnosis and progression - clear from a retina standpoint to proceed with cataract surgery when both patient and surgeon are ready  8. Pseudophakia OS  - s/p CE/IOL (07.29.21, Dr. Kathlen Mody)  - IOL in good position, doing well - monitor   Ophthalmic Meds Ordered this visit:  Meds ordered this encounter  Medications   Bevacizumab (AVASTIN) SOLN 1.25 mg     Return for f/u 12-14 weeks, PDR OU, DFE, OCT.  There are no Patient Instructions on file for this visit.   This document serves as a record of services personally performed by Gardiner Sleeper,  MD, PhD. It was created on their behalf by San Jetty. Owens Shark, OA an ophthalmic technician. The creation of this record is the provider's dictation and/or activities during the visit.    Electronically signed by: San Jetty. Owens Shark, [place] 08.03.2023 1:44 AM  Gardiner Sleeper, M.D., Ph.D. Diseases & Surgery of the Retina and Vitreous [facility]  I have reviewed the above documentation for accuracy and completeness, and I agree with the above. Gardiner Sleeper, M.D., Ph.D.  01/15/22 1:44 AM  Abbreviations: M myopia (nearsighted); A astigmatism; H hyperopia (farsighted); P presbyopia; Mrx spectacle prescription;  CTL contact lenses; OD right eye; OS left eye; OU both eyes  XT exotropia; ET esotropia; PEK punctate epithelial keratitis; PEE punctate epithelial erosions; DES dry eye syndrome; MGD meibomian gland dysfunction; ATs artificial tears; PFAT's preservative free artificial tears; [place] nuclear sclerotic cataract; PSC posterior subcapsular cataract; ERM epi-retinal membrane; PVD posterior vitreous detachment; RD retinal detachment; DM diabetes mellitus; DR diabetic retinopathy; NPDR non-proliferative diabetic retinopathy; PDR proliferative diabetic retinopathy; CSME clinically significant macular edema; DME diabetic macular edema; dbh dot blot hemorrhages; CWS cotton wool spot; POAG primary open angle glaucoma; C/D cup-to-disc ratio; HVF humphrey visual field; GVF goldmann visual field; OCT optical coherence tomography; IOP intraocular pressure; BRVO Branch retinal vein occlusion; CRVO central retinal vein occlusion; CRAO central retinal artery occlusion; BRAO branch retinal artery occlusion; RT retinal tear; SB scleral buckle; PPV pars plana vitrectomy; VH Vitreous hemorrhage; PRP panretinal laser photocoagulation; IVK intravitreal kenalog; VMT vitreomacular traction; MH Macular hole;  NVD neovascularization of the disc; NVE neovascularization elsewhere; AREDS age related eye disease study; ARMD age related macular degeneration; POAG primary open angle glaucoma; EBMD epithelial/anterior basement membrane dystrophy; ACIOL anterior chamber intraocular lens; IOL intraocular lens; PCIOL posterior chamber intraocular lens; Phaco/IOL phacoemulsification with intraocular lens placement; [place] photorefractive keratectomy; LASIK laser assisted in situ keratomileusis; HTN hypertension; DM diabetes mellitus; COPD chronic obstructive pulmonary disease

## 2022-01-14 ENCOUNTER — Ambulatory Visit (INDEPENDENT_AMBULATORY_CARE_PROVIDER_SITE_OTHER): Payer: Medicare Other | Admitting: Ophthalmology

## 2022-01-14 ENCOUNTER — Encounter (INDEPENDENT_AMBULATORY_CARE_PROVIDER_SITE_OTHER): Payer: Self-pay | Admitting: Ophthalmology

## 2022-01-14 DIAGNOSIS — H35033 Hypertensive retinopathy, bilateral: Secondary | ICD-10-CM

## 2022-01-14 DIAGNOSIS — H40053 Ocular hypertension, bilateral: Secondary | ICD-10-CM

## 2022-01-14 DIAGNOSIS — H4313 Vitreous hemorrhage, bilateral: Secondary | ICD-10-CM | POA: Diagnosis not present

## 2022-01-14 DIAGNOSIS — E113513 Type 2 diabetes mellitus with proliferative diabetic retinopathy with macular edema, bilateral: Secondary | ICD-10-CM

## 2022-01-14 DIAGNOSIS — I10 Essential (primary) hypertension: Secondary | ICD-10-CM | POA: Diagnosis not present

## 2022-01-14 DIAGNOSIS — Z961 Presence of intraocular lens: Secondary | ICD-10-CM

## 2022-01-14 DIAGNOSIS — H25811 Combined forms of age-related cataract, right eye: Secondary | ICD-10-CM

## 2022-01-14 DIAGNOSIS — I639 Cerebral infarction, unspecified: Secondary | ICD-10-CM

## 2022-01-15 ENCOUNTER — Encounter (INDEPENDENT_AMBULATORY_CARE_PROVIDER_SITE_OTHER): Payer: Self-pay | Admitting: Ophthalmology

## 2022-01-15 DIAGNOSIS — E113513 Type 2 diabetes mellitus with proliferative diabetic retinopathy with macular edema, bilateral: Secondary | ICD-10-CM

## 2022-01-15 MED ORDER — BEVACIZUMAB CHEMO INJECTION 1.25MG/0.05ML SYRINGE FOR KALEIDOSCOPE
1.2500 mg | INTRAVITREAL | Status: AC | PRN
  Administered 2022-01-15: 1.25 mg via INTRAVITREAL

## 2022-03-18 LAB — HM DIABETES EYE EXAM

## 2022-03-24 ENCOUNTER — Encounter: Payer: Self-pay | Admitting: Internal Medicine

## 2022-04-04 NOTE — Progress Notes (Shared)
[facility] Note  04/08/2022     CHIEF COMPLAINT Patient presents for No chief complaint on file.   HISTORY OF PRESENT ILLNESS: Bill [name] is a 41 y.o. male who presents to the clinic today for:    Pt states he can tell it's time for treatment on his right eye bc his vision is a little blurry, pt scheduled on appt with Dr. Lucianne Lei to have his cataract in the right eye removed, but will not be seen until October  Referring physician: Isaac Bliss, Rayford Halsted, MD [place],  [place] [address]  HISTORICAL INFORMATION:   Selected notes from the MEDICAL RECORD NUMBER Referred by Dr. [name] for concern of severe NPDR / PDR OU   CURRENT MEDICATIONS: No current outpatient medications on file. (Ophthalmic Drugs)   No current facility-administered medications for this visit. (Ophthalmic Drugs)   Current Outpatient Medications (Other)  Medication Sig   aspirin 81 MG EC tablet Take 81 mg by mouth as needed. (Patient not taking: Reported on 02/23/2021)   atorvastatin (LIPITOR) 10 MG tablet TAKE 1 TABLET BY MOUTH EVERY DAY   AURYXIA 1 GM 210 MG(Fe) tablet Take 210 mg by mouth 3 (three) times daily with meals.   Dulaglutide (TRULICITY) 1.95 KD/3.2IZ SOPN Inject 0.75 mg into the skin once a week.   ferric citrate (AURYXIA) 1 GM 210 MG(Fe) tablet Take 210 mg by mouth as directed.   fluticasone (FLONASE) 50 MCG/ACT nasal spray Place 1 spray into both nostrils daily as needed for allergies or rhinitis.   multivitamin (RENA-VIT) TABS tablet Take 1 tablet by mouth daily.   sertraline (ZOLOFT) 50 MG tablet TAKE 1 TABLET BY MOUTH EVERY DAY   No current facility-administered medications for this visit. (Other)   REVIEW OF SYSTEMS:   ALLERGIES Allergies  Allergen Reactions   Food     Melons-itchy/headaches   PAST MEDICAL HISTORY Past Medical History:  Diagnosis Date   Anemia    Anxiety    situational   Diabetes mellitus without  complication ([place])    Diabetic retinopathy ([place])    PDR OU   ESRD on dialysis ([place])    Dialyzing T-Th-S   History of blood transfusion    Hypertension    PONV (postoperative nausea and vomiting)    03/22/2019- only 15 years ago   Renal disorder    Stroke [facility]) 2019   vision   Past Surgical History:  Procedure Laterality Date   A/V FISTULAGRAM N/A 03/11/2019   Procedure: A/V FISTULAGRAM - Left Arm;  Surgeon: Waynetta Sandy, MD;  Location: [facility];  Service: Cardiovascular;  Laterality: N/A;   arm surgery Right    Fracture   AV FISTULA PLACEMENT Left 01/25/2019   Procedure: ARTERIOVENOUS (AV) FISTULA CREATION  LEFT UPPER  ARM;  Surgeon: Serafina Mitchell, MD;  Location: [facility];  Service: Vascular;  Laterality: Left;   BASCILIC VEIN TRANSPOSITION Left 03/27/2019   Procedure: SECOND STAGE BASILIC VEIN TRANSPOSITION LEFT ARM;  Surgeon: Serafina Mitchell, MD;  Location: [facility];  Service: Vascular;  Laterality: Left;   KIDNEY TRANSPLANT  11/05/2020   [facility]   WISDOM TOOTH EXTRACTION     FAMILY HISTORY Family History  Problem Relation Age of Onset   Diabetes Mellitus II Mother    Hypertension Mother    Cataracts Mother    Diabetes Mellitus II Father    Stroke Father    Hypertension  Father    Glaucoma Maternal Grandmother    Amblyopia Neg Hx    Blindness Neg Hx    Macular degeneration Neg Hx    Retinal detachment Neg Hx    Strabismus Neg Hx    Retinitis pigmentosa Neg Hx    SOCIAL HISTORY Social History   Tobacco Use   Smoking status: Never   Smokeless tobacco: Never  Vaping Use   Vaping Use: Never used  Substance Use Topics   Alcohol use: Not Currently   Drug use: Not Currently       OPHTHALMIC EXAM:  Not recorded    IMAGING AND PROCEDURES  Imaging and Procedures for @TODAY @          ASSESSMENT/PLAN: No diagnosis found.  1,2. Proliferative diabetic retinopathy OU  - delayed follow up -- 4 mos instead of 3  - last A1c was  11.9 on 05.11.23; 9.6 on 11.29.22; 7.8 on 7.20.22; 7.2 on 01.13.22  - s/p PRP OD (10.02.19), (01.22.20), (02.12.20), (03.08.22)  - s/p PRP OS (05/02/2018), (02.19.20), (2.17.22)  - s/p IVK #1 OD (01.22.20), OS (02.12.20)  - s/p IVA OD #1 (10.21.20), #2 (12.02.20), #3 (01.13.21), #4 (02.10.21), #5 (04.19.21), #6 (05.21.21), #7 (06.21.21), #8 (08.19.21), #9 (10.11.21), #10 (12.01.21), #11 (01.25.22), #12 (03.01.22), #13 (04.05.22), #14 (6.6.22), #15 (09.22.22), #16 (03.28.23)( #17 01/14/22)  - s/p IVA OS #1 (12.02.20), #2 (04.19.21), #3 (05.21.21), #4 (06.21.21), #5 (08.19.21), #6 (10.11.21), #7 (12.01.21), #8 (01.25.22), #9 (03.01.22), #10 (04.05.22), #11 (6.6.22), #12 (09.22.22)   - FA 8.19.19 shows leaking MA, patches of capillary nonperfusion OU  - repeat FA 10.11.21 shows persistent NV OU, large patches of vascular nonperfusion OU (OS>OD) -- fill in PRP completed OU (OS  2.17.22, OD 3.8.22)  - repeat FA 9.13.22 shows interval improvement in NV/leakage  - BCVA OD: 20/40 - improved, OS: 20/100  - exam shows stable improvement in VH, preretinal and subhyaloid hemorrhage, persistent preretinal fibrosis OU  - OCT shows OD: Persistent PRF and ERM, persistent tractional edema superior macula, interval release of vitreous traction along IT arcades, persistent vitreous opacities; OS: persistent retinal atrophy, tractional edema nasal to disc  - pt on heparin  - discussed possibility of surgery being needed in the future to stabilize eye and prevent further progression of diabetic eye disease - would recommend cataract surgery first  - recommend IVA OD #18 today, 10.27.23 w/ return again in ~3 months  - will hold off on left eye injection today  - pt wishes to proceed with injection OD  - RBA of procedure discussed, questions answered - informed consent obtained and signed - see procedure note - Avastin informed consent form signed and scanned on 01.13.2021  - f/u 12-14 weeks DFE, OCT, possible  injection(s)  3,4. 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 [name] in mid August 2019 with a 2 wk history intractable headaches and decreased vision OU  - found to have BP of 170s/90s -- sent to ED and was admitted to [facility] where SBP was up to 200  - discussed importance of tight BP control   - pt reports improved compliance with BP meds and BP readings have improved  5. 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 Limited 2 sequence MRI head: Acute RIGHT periatrial subcentimeter  nonhemorrhagic infarct, in region of optic tract.  - could be limiting vision--will check formal VF once macular edema improves  6. Ocular Hypertention OU  -  today, IOP 14,15  - patient reports not taking cosopt for several months--IOP ok OU  7. Mixed cataract OD - The symptoms of cataract, surgical options, and treatments and risks were discussed with patient. - discussed diagnosis and progression - clear from a retina standpoint to proceed with cataract surgery when both patient and surgeon are ready  8. Pseudophakia OS  - s/p CE/IOL (07.29.21, Dr. Kathlen Mody)  - IOL in good position, doing well - monitor   Ophthalmic Meds Ordered this visit:  No orders of the defined types were placed in this encounter.    No follow-ups on file.  There are no Patient Instructions on file for this visit.    Electronically signed by:  Renaldo Reel, COT 10.23.23 10:46 AM   Gardiner Sleeper, M.D., Ph.D. Diseases & Surgery of the Retina and Vitreous [facility]  I have reviewed the above documentation for accuracy and completeness, and I agree with the above. Gardiner Sleeper, M.D., Ph.D. 01/15/22 10:46 AM  Abbreviations: M myopia (nearsighted); A astigmatism; H hyperopia (farsighted); P presbyopia; Mrx spectacle prescription;  CTL contact lenses; OD right eye; OS left eye; OU both eyes  XT exotropia; ET  esotropia; PEK punctate epithelial keratitis; PEE punctate epithelial erosions; DES dry eye syndrome; MGD meibomian gland dysfunction; ATs artificial tears; PFAT's preservative free artificial tears; [place] nuclear sclerotic cataract; PSC posterior subcapsular cataract; ERM epi-retinal membrane; PVD posterior vitreous detachment; RD retinal detachment; DM diabetes mellitus; DR diabetic retinopathy; NPDR non-proliferative diabetic retinopathy; PDR proliferative diabetic retinopathy; CSME clinically significant macular edema; DME diabetic macular edema; dbh dot blot hemorrhages; CWS cotton wool spot; POAG primary open angle glaucoma; C/D cup-to-disc ratio; HVF humphrey visual field; GVF goldmann visual field; OCT optical coherence tomography; IOP intraocular pressure; BRVO Branch retinal vein occlusion; CRVO central retinal vein occlusion; CRAO central retinal artery occlusion; BRAO branch retinal artery occlusion; RT retinal tear; SB scleral buckle; PPV pars plana vitrectomy; VH Vitreous hemorrhage; PRP panretinal laser photocoagulation; IVK intravitreal kenalog; VMT vitreomacular traction; MH Macular hole;  NVD neovascularization of the disc; NVE neovascularization elsewhere; AREDS age related eye disease study; ARMD age related macular degeneration; POAG primary open angle glaucoma; EBMD epithelial/anterior basement membrane dystrophy; ACIOL anterior chamber intraocular lens; IOL intraocular lens; PCIOL posterior chamber intraocular lens; Phaco/IOL phacoemulsification with intraocular lens placement; [place] photorefractive keratectomy; LASIK laser assisted in situ keratomileusis; HTN hypertension; DM diabetes mellitus; COPD chronic obstructive pulmonary disease

## 2022-04-08 ENCOUNTER — Encounter (INDEPENDENT_AMBULATORY_CARE_PROVIDER_SITE_OTHER): Payer: Self-pay

## 2022-04-08 ENCOUNTER — Encounter (INDEPENDENT_AMBULATORY_CARE_PROVIDER_SITE_OTHER): Payer: Medicare Other | Admitting: Ophthalmology

## 2022-04-08 DIAGNOSIS — H25811 Combined forms of age-related cataract, right eye: Secondary | ICD-10-CM

## 2022-04-08 DIAGNOSIS — H4313 Vitreous hemorrhage, bilateral: Secondary | ICD-10-CM

## 2022-04-08 DIAGNOSIS — H35033 Hypertensive retinopathy, bilateral: Secondary | ICD-10-CM

## 2022-04-08 DIAGNOSIS — Z961 Presence of intraocular lens: Secondary | ICD-10-CM

## 2022-04-08 DIAGNOSIS — I639 Cerebral infarction, unspecified: Secondary | ICD-10-CM

## 2022-04-08 DIAGNOSIS — H40053 Ocular hypertension, bilateral: Secondary | ICD-10-CM

## 2022-04-08 DIAGNOSIS — I10 Essential (primary) hypertension: Secondary | ICD-10-CM

## 2022-04-08 DIAGNOSIS — E113513 Type 2 diabetes mellitus with proliferative diabetic retinopathy with macular edema, bilateral: Secondary | ICD-10-CM

## 2022-04-12 NOTE — Progress Notes (Signed)
[facility] Note  04/19/2022     CHIEF COMPLAINT Patient presents for Retina Follow Up   HISTORY OF PRESENT ILLNESS: Bill [name] is a 41 y.o. male who presents to the clinic today for:  HPI     Retina Follow Up   Patient presents with  Other.  In right eye.  This started 1 week ago.  I, the attending physician,  performed the HPI with the patient and updated documentation appropriately.        Comments   Patient here for 1 weeks retina follow up for VH/ subhyaloid heme OD. Patient states vision the same. No eye pain.       Last edited by [name], MD on 04/20/2022  9:38 PM.    Pt feels like vision is slightly improved   Referring physician: Isaac Bliss, Rayford Halsted, MD [place],  [place] [address]  HISTORICAL INFORMATION:   Selected notes from the MEDICAL RECORD NUMBER Referred by Dr. [name] for concern of severe NPDR / PDR OU   CURRENT MEDICATIONS: No current outpatient medications on file. (Ophthalmic Drugs)   No current facility-administered medications for this visit. (Ophthalmic Drugs)   Current Outpatient Medications (Other)  Medication Sig   atorvastatin (LIPITOR) 10 MG tablet TAKE 1 TABLET BY MOUTH EVERY DAY   AURYXIA 1 GM 210 MG(Fe) tablet Take 210 mg by mouth 3 (three) times daily with meals.   Dulaglutide (TRULICITY) 2.44 WN/0.2VO SOPN Inject 0.75 mg into the skin once a week.   ferric citrate (AURYXIA) 1 GM 210 MG(Fe) tablet Take 210 mg by mouth as directed.   fluticasone (FLONASE) 50 MCG/ACT nasal spray Place 1 spray into both nostrils daily as needed for allergies or rhinitis.   multivitamin (RENA-VIT) TABS tablet Take 1 tablet by mouth daily.   sertraline (ZOLOFT) 50 MG tablet TAKE 1 TABLET BY MOUTH EVERY DAY   aspirin 81 MG EC tablet Take 81 mg by mouth as needed. (Patient not taking: Reported on 04/13/2022)   No current facility-administered medications for this visit. (Other)    REVIEW OF SYSTEMS: ROS   Positive for: Neurological, Genitourinary, Endocrine, Cardiovascular, Eyes, Psychiatric Negative for: Constitutional, Gastrointestinal, Skin, Musculoskeletal, HENT, Respiratory, Allergic/Imm, Heme/Lymph Last edited by [name], COA on 04/19/2022  8:59 AM.     ALLERGIES Allergies  Allergen Reactions   Food     Melons-itchy/headaches   PAST MEDICAL HISTORY Past Medical History:  Diagnosis Date   Anemia    Anxiety    situational   Diabetes mellitus without complication ([place])    Diabetic retinopathy ([place])    PDR OU   ESRD on dialysis ([place])    Dialyzing T-Th-S   History of blood transfusion    Hypertension    PONV (postoperative nausea and vomiting)    03/22/2019- only 15 years ago   Renal disorder    Stroke [facility]) 2019   vision   Past Surgical History:  Procedure Laterality Date   A/V FISTULAGRAM N/A 03/11/2019   Procedure: A/V FISTULAGRAM - Left Arm;  Surgeon: Waynetta Sandy, MD;  Location: [facility];  Service: Cardiovascular;  Laterality: N/A;   arm surgery Right    Fracture   AV FISTULA PLACEMENT Left 01/25/2019   Procedure: ARTERIOVENOUS (AV) FISTULA CREATION  LEFT UPPER  ARM;  Surgeon: Serafina Mitchell, MD;  [facility];  Service: Vascular;  Laterality: Left;   [place] Left 03/27/2019  Procedure: SECOND STAGE BASILIC VEIN TRANSPOSITION LEFT ARM;  Surgeon: Serafina Mitchell, MD;  Location: [facility];  Service: Vascular;  Laterality: Left;   KIDNEY TRANSPLANT  11/05/2020   [facility]   WISDOM TOOTH EXTRACTION     FAMILY HISTORY Family History  Problem Relation Age of Onset   Diabetes Mellitus II Mother    Hypertension Mother    Cataracts Mother    Diabetes Mellitus II Father    Stroke Father    Hypertension Father    Glaucoma Maternal Grandmother    Amblyopia Neg Hx    Blindness Neg Hx    Macular degeneration Neg Hx    Retinal detachment Neg Hx    Strabismus Neg Hx     Retinitis pigmentosa Neg Hx    SOCIAL HISTORY Social History   Tobacco Use   Smoking status: Never   Smokeless tobacco: Never  Vaping Use   Vaping Use: Never used  Substance Use Topics   Alcohol use: Not Currently   Drug use: Not Currently       OPHTHALMIC EXAM:  Base Eye Exam     Visual Acuity (Snellen - Linear)       Right Left   Dist [place] CF at 3' 20/80 -2   Dist ph  NI NI         Tonometry (Tonopen, 8:57 AM)       Right Left   Pressure 11 14         Pupils       Dark Light Shape React APD   Right 3 2 Round Brisk None   Left 3 2 Round Brisk None         Visual Fields (Counting fingers)       Left Right    Full Full         Extraocular Movement       Right Left    Full, Ortho Full, Ortho         Neuro/Psych     Oriented x3: Yes   Mood/Affect: Normal         Dilation     Both eyes: 1.0% Mydriacyl, 2.5% Phenylephrine @ 8:57 AM           Slit Lamp and Fundus Exam     Slit Lamp Exam       Right Left   Lids/Lashes Dermatochalasis - upper lid Normal   Conjunctiva/Sclera mild melanosis White and quiet   Cornea Trace Punctate epithelial erosions Trace Punctate epithelial erosions   Anterior Chamber deep and clear Deep and quiet   Iris Round and dilated, No NVI Round and dilated, no NVI   Lens 1-2+ Nuclear sclerosis, 1-2+ Cortical cataract, 2+Posterior subcapsular cataract PC IOL in good position   Anterior Vitreous scattered fibrosis superior to disc, subhyaloid heme -- slightly improved, still quite dense, old white VH inferiorly, blood stained vitreous condensations Syneresis, white blood stained vitreous condensations settling inferiorly and improving         Fundus Exam       Right Left   Disc +fibrosis, +punctate hemes, attenuated disc vessels 2-3+Pallor, Sharp rim, Compact, +fibrosis, NVD - regressed, no heme   C/D Ratio 0.1 0.2   Macula Obscured by subhyaloid heme Flat, blunted foveal reflex, atrophic, +ERM, flat  choroidal nevus superior macula   Vessels Severe attenuation, Tortuous, +fibrosis, +NV inferior to disc -- regressing Sclerotic arterioles, Tortuous, +NV -- regressing, fibrosis along superior arcades, severe Vascular attenuation   Periphery  Grossly attached, pre-retinal heme settled inferiorly Attached; 360 PRP in place, good posterior fill in, scattered tractional fibrosis, scattered MA/DBH           IMAGING AND PROCEDURES  Imaging and Procedures for @TODAY @  OCT, Retina - OU - Both Eyes       Right Eye Progression has improved. Findings include (Only single line scan obtained -- mild interval improvement in vitreous opacities).   Left Eye Quality was good. Central Foveal Thickness: 273. Progression has been stable. Findings include no IRF, no SRF, abnormal foveal contour, intraretinal hyper-reflective material, epiretinal membrane, vitreous traction, inner retinal atrophy, outer retinal atrophy, vitreomacular adhesion , preretinal fibrosis (persistent retinal atrophy, tractional edema nasal to disc).   Notes *Images captured and stored on drive  Diagnosis / Impression:  PDR w/ tractional fibrosis OU OD: Only single line scan obtained -- mild interval improvement in vitreous opacities OS: persistent retinal atrophy, tractional edema nasal to disc  Clinical management:  See below  Abbreviations: NFP - Normal foveal profile. CME - cystoid macular edema. PED - pigment epithelial detachment. IRF - intraretinal fluid. SRF - subretinal fluid. EZ - ellipsoid zone. ERM - epiretinal membrane. ORA - outer retinal atrophy. ORT - outer retinal tubulation. SRHM - subretinal hyper-reflective material             ASSESSMENT/PLAN:   ICD-10-CM   1. Proliferative diabetic retinopathy of both eyes with macular edema associated with type 2 diabetes mellitus ([place])  E11.3513 OCT, Retina - OU - Both Eyes    2. Vitreous hemorrhage of both eyes ([place])  H43.13     3. Essential hypertension  I10      4. Hypertensive retinopathy of both eyes  H35.033     5. Acute CVA (cerebrovascular accident) ([place])  I63.9     6. Ocular hypertension, bilateral  H40.053     7. 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 Pseudophakia  Z96.1      1,2. Proliferative diabetic retinopathy OU  - delayed follow up -- 4 mos instead of 3  - last A1c was 11.9 on 05.11.23; 9.6 on 11.29.22; 7.8 on 7.20.22; 7.2 on 01.13.22  - s/p PRP OD (10.02.19), (01.22.20), (02.12.20), (03.08.22)  - s/p PRP OS (05/02/2018), (02.19.20), (2.17.22)  - s/p IVK #1 OD (01.22.20), OS (02.12.20)  - s/p IVA OD #1 (10.21.20), #2 (12.02.20), #3 (01.13.21), #4 (02.10.21), #5 (04.19.21), #6 (05.21.21), #7 (06.21.21), #8 (08.19.21), #9 (10.11.21), #10 (12.01.21), #11 (01.25.22), #12 (03.01.22), #13 (04.05.22), #14 (6.6.22), #15 (09.22.22), #16 (03.28.23), #17 (08.04.23), #18 (11.01.23 for VH)  - s/p IVA OS #1 (12.02.20), #2 (04.19.21), #3 (05.21.21), #4 (06.21.21), #5 (08.19.21), #6 (10.11.21), #7 (12.01.21), #8 (01.25.22), #9 (03.01.22), #10 (04.05.22), #11 (6.6.22), #12 (09.22.22)   - FA 8.19.19 shows leaking MA, patches of capillary nonperfusion OU  - repeat FA 10.11.21 shows persistent NV OU, large patches of vascular nonperfusion OU (OS>OD) -- fill in PRP completed OU (OS  2.17.22, OD 3.8.22)  - repeat FA 9.13.22 shows interval improvement in NV/leakage  - BCVA OD: CF -- stable from 11.01.23, OS: 20/80  - exam shows mild interval improvement in subhyaloid and vit heme  - OCT shows OD: Only single line scan obtained -- mild interval improvement in vitreous opacities; OS: persistent retinal atrophy, tractional edema nasal to disc  - discussed possibility of surgery being needed in the future to stabilize eye and prevent further progression of diabetic eye disease - would recommend  cataract surgery first - [place] informed consent form signed and scanned on 01.13.2021  - f/u November 29 or later, DFE, OCT,  possible injection(s)  3,4. 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 [name] in mid August 2019 with a 2 wk history intractable headaches and decreased vision OU  - found to have BP of 170s/90s -- sent to ED and was admitted to [facility] where SBP was up to 200  - discussed importance of tight BP control   - pt reports improved compliance with BP meds and BP readings have improved  5. 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 Limited 2 sequence MRI head: Acute RIGHT periatrial subcentimeter  nonhemorrhagic infarct, in region of optic tract.  - could be limiting vision--will check formal VF once macular edema improves  6. Ocular Hypertention OU  - today, IOP 11,14  - patient reports not taking cosopt for several months--IOP ok OU  7. Mixed cataract OD - The symptoms of cataract, surgical options, and treatments and risks were discussed with patient. - discussed diagnosis and progression  8. Pseudophakia OS  - s/p CE/IOL (07.29.21, Dr. Kathlen Mody)  - IOL in good position, doing well - monitor   Ophthalmic Meds Ordered this visit:  No orders of the defined types were placed in this encounter.    Return for f/u November 29 or later, VH OD, DFE, OCT.  There are no Patient Instructions on file for this visit.   This document serves as a record of services personally performed by Gardiner Sleeper, MD, PhD. It was created on their behalf by Renaldo Reel, [place] an ophthalmic technician. The creation of this record is the provider's dictation and/or activities during the visit.    Electronically signed by:  Renaldo Reel, COT  10.31.23 9:44 PM  This document serves as a record of services personally performed by Gardiner Sleeper, MD, PhD. It was created on their behalf by San Jetty. Owens Shark, OA an ophthalmic technician. The creation of this record is the provider's dictation and/or activities during the visit.     Electronically signed by: San Jetty. Marguerita Merles 11.07.2023 9:44 PM  Gardiner Sleeper, M.D., Ph.D. Diseases & Surgery of the Retina and Vitreous [facility]  I have reviewed the above documentation for accuracy and completeness, and I agree with the above. Gardiner Sleeper, M.D., Ph.D. 04/20/22 9:47 PM   Abbreviations: M myopia (nearsighted); A astigmatism; H hyperopia (farsighted); P presbyopia; Mrx spectacle prescription;  CTL contact lenses; OD right eye; OS left eye; OU both eyes  XT exotropia; ET esotropia; PEK punctate epithelial keratitis; PEE punctate epithelial erosions; DES dry eye syndrome; MGD meibomian gland dysfunction; ATs artificial tears; PFAT's preservative free artificial tears; [place] nuclear sclerotic cataract; PSC posterior subcapsular cataract; ERM epi-retinal membrane; PVD posterior vitreous detachment; RD retinal detachment; DM diabetes mellitus; DR diabetic retinopathy; NPDR non-proliferative diabetic retinopathy; PDR proliferative diabetic retinopathy; CSME clinically significant macular edema; DME diabetic macular edema; dbh dot blot hemorrhages; CWS cotton wool spot; POAG primary open angle glaucoma; C/D cup-to-disc ratio; HVF humphrey visual field; GVF goldmann visual field; OCT optical coherence tomography; IOP intraocular pressure; BRVO Branch retinal vein occlusion; CRVO central retinal vein occlusion; CRAO central retinal artery occlusion; BRAO branch retinal artery occlusion; RT retinal tear; SB scleral buckle; PPV pars plana vitrectomy; VH Vitreous hemorrhage; PRP panretinal laser photocoagulation; IVK intravitreal kenalog; VMT vitreomacular traction; MH Macular  hole;  NVD neovascularization of the disc; NVE neovascularization elsewhere; AREDS age related eye disease study; ARMD age related macular degeneration; POAG primary open angle glaucoma; EBMD epithelial/anterior basement membrane dystrophy; ACIOL anterior chamber intraocular lens; IOL  intraocular lens; PCIOL posterior chamber intraocular lens; Phaco/IOL phacoemulsification with intraocular lens placement; [place] photorefractive keratectomy; LASIK laser assisted in situ keratomileusis; HTN hypertension; DM diabetes mellitus; COPD chronic obstructive pulmonary disease

## 2022-04-13 ENCOUNTER — Ambulatory Visit (INDEPENDENT_AMBULATORY_CARE_PROVIDER_SITE_OTHER): Payer: Medicare Other | Admitting: Ophthalmology

## 2022-04-13 ENCOUNTER — Encounter (INDEPENDENT_AMBULATORY_CARE_PROVIDER_SITE_OTHER): Payer: Self-pay | Admitting: Ophthalmology

## 2022-04-13 VITALS — BP 121/79 | HR 106

## 2022-04-13 DIAGNOSIS — E113513 Type 2 diabetes mellitus with proliferative diabetic retinopathy with macular edema, bilateral: Secondary | ICD-10-CM | POA: Diagnosis not present

## 2022-04-13 DIAGNOSIS — H4313 Vitreous hemorrhage, bilateral: Secondary | ICD-10-CM

## 2022-04-13 DIAGNOSIS — I10 Essential (primary) hypertension: Secondary | ICD-10-CM

## 2022-04-13 DIAGNOSIS — H40053 Ocular hypertension, bilateral: Secondary | ICD-10-CM

## 2022-04-13 DIAGNOSIS — Z961 Presence of intraocular lens: Secondary | ICD-10-CM

## 2022-04-13 DIAGNOSIS — H35033 Hypertensive retinopathy, bilateral: Secondary | ICD-10-CM | POA: Diagnosis not present

## 2022-04-13 DIAGNOSIS — H25811 Combined forms of age-related cataract, right eye: Secondary | ICD-10-CM

## 2022-04-13 DIAGNOSIS — I639 Cerebral infarction, unspecified: Secondary | ICD-10-CM

## 2022-04-13 MED ORDER — BEVACIZUMAB CHEMO INJECTION 1.25MG/0.05ML SYRINGE FOR KALEIDOSCOPE
1.2500 mg | INTRAVITREAL | Status: AC | PRN
  Administered 2022-04-13: 1.25 mg via INTRAVITREAL

## 2022-04-13 NOTE — Progress Notes (Signed)
[facility] Note  04/13/2022     CHIEF COMPLAINT Patient presents for Retina Follow Up   HISTORY OF PRESENT ILLNESS: Bill [name] is a 41 y.o. male who presents to the clinic today for:  HPI     Retina Follow Up   Patient presents with  Retinal Break/Detachment.  In both eyes.  Severity is moderate.  Duration of 3 months.  Since onset it is rapidly worsening.  I, the attending physician,  performed the HPI with the patient and updated documentation appropriately.        Comments   Pt here for 3 mo ret f/u, delayed from 10/27. Pt states he missed appointment due to not having any issues but woke up yesterday and could tell he needed another treatment so r/s his appointment. Then yesterday evening he started seeing blood. Pt states this AM is worse. No changes in OS. Pts blood sugar reportedly spiking the last few weeks. No recent A1C.       Last edited by [name], MD on 04/13/2022 12:20 PM.    Pt was supposed to be seen on Friday, 10.27.23, but pt canceled due to "not having any issues" at that time and not being able to get off work, pt r/s that appt to Tuesday, 11.07.23, pt states he started seeing blood OD yesterday, so he called this morning to be seen sooner, pt has not checked his blood pressure lately, but states he has been "stressed" recently, pt is no longer on dialysis and not on any blood thinners, he did take some ibuprofen for neck pain, pt is supposed to be going to see Dr. Lucianne Lei on 11.13.23 for measurements for cataract sx  Referring physician: Isaac Bliss, Rayford Halsted, MD [place],  [place] [address]  HISTORICAL INFORMATION:   Selected notes from the MEDICAL RECORD NUMBER Referred by Dr. [name] for concern of severe NPDR / PDR OU   CURRENT MEDICATIONS: No current outpatient medications on file. (Ophthalmic Drugs)   No current facility-administered medications for this visit. (Ophthalmic Drugs)    Current Outpatient Medications (Other)  Medication Sig   atorvastatin (LIPITOR) 10 MG tablet TAKE 1 TABLET BY MOUTH EVERY DAY   AURYXIA 1 GM 210 MG(Fe) tablet Take 210 mg by mouth 3 (three) times daily with meals.   Dulaglutide (TRULICITY) 3.53 IR/4.4RX SOPN Inject 0.75 mg into the skin once a week.   ferric citrate (AURYXIA) 1 GM 210 MG(Fe) tablet Take 210 mg by mouth as directed.   fluticasone (FLONASE) 50 MCG/ACT nasal spray Place 1 spray into both nostrils daily as needed for allergies or rhinitis.   multivitamin (RENA-VIT) TABS tablet Take 1 tablet by mouth daily.   sertraline (ZOLOFT) 50 MG tablet TAKE 1 TABLET BY MOUTH EVERY DAY   aspirin 81 MG EC tablet Take 81 mg by mouth as needed. (Patient not taking: Reported on 04/13/2022)   No current facility-administered medications for this visit. (Other)   REVIEW OF SYSTEMS: ROS   Positive for: Neurological, Genitourinary, Endocrine, Cardiovascular, Eyes, Psychiatric Negative for: Constitutional, Gastrointestinal, Skin, Musculoskeletal, HENT, Respiratory, Allergic/Imm, Heme/Lymph Last edited by Kingsley Spittle, COT on 04/13/2022 10:35 AM.     ALLERGIES Allergies  Allergen Reactions   Food     Melons-itchy/headaches   PAST MEDICAL HISTORY Past Medical History:  Diagnosis Date   Anemia    Anxiety    situational   Diabetes mellitus without complication ([place])  Diabetic retinopathy ([place])    PDR OU   ESRD on dialysis [facility])    Dialyzing T-Th-S   History of blood transfusion    Hypertension    PONV (postoperative nausea and vomiting)    03/22/2019- only 15 years ago   Renal disorder    Stroke [facility]) 2019   vision   Past Surgical History:  Procedure Laterality Date   A/V FISTULAGRAM N/A 03/11/2019   Procedure: A/V FISTULAGRAM - Left Arm;  Surgeon: Waynetta Sandy, MD;  Location: [facility];  Service: Cardiovascular;  Laterality: N/A;   arm surgery Right    Fracture   AV FISTULA PLACEMENT Left  01/25/2019   Procedure: ARTERIOVENOUS (AV) FISTULA CREATION  LEFT UPPER  ARM;  Surgeon: Serafina Mitchell, MD;  Location: [facility] OR;  Service: Vascular;  Laterality: Left;   [place] Left 03/27/2019   Procedure: SECOND STAGE BASILIC VEIN TRANSPOSITION LEFT ARM;  Surgeon: Serafina Mitchell, MD;  Location: [facility] OR;  Service: Vascular;  Laterality: Left;   KIDNEY TRANSPLANT  11/05/2020   [facility]   WISDOM TOOTH EXTRACTION     FAMILY HISTORY Family History  Problem Relation Age of Onset   Diabetes Mellitus II Mother    Hypertension Mother    Cataracts Mother    Diabetes Mellitus II Father    Stroke Father    Hypertension Father    Glaucoma Maternal Grandmother    Amblyopia Neg Hx    Blindness Neg Hx    Macular degeneration Neg Hx    Retinal detachment Neg Hx    Strabismus Neg Hx    Retinitis pigmentosa Neg Hx    SOCIAL HISTORY Social History   Tobacco Use   Smoking status: Never   Smokeless tobacco: Never  Vaping Use   Vaping Use: Never used  Substance Use Topics   Alcohol use: Not Currently   Drug use: Not Currently       OPHTHALMIC EXAM:  Base Eye Exam     Visual Acuity (Snellen - Linear)       Right Left   Dist [place] CF @ 1` 20/150 -2   Dist ph [place] NI 20/100 -2         Tonometry (Tonopen, 10:42 AM)       Right Left   Pressure 16 14         Pupils       Dark Light Shape React APD   Right 3 2 Round Minimal Trace   Left 3 2 Round Brisk None         Visual Fields       Left Right    Full    Restrictions  Total superior nasal, inferior nasal deficiencies; Partial outer superior temporal, inferior temporal deficiencies         Extraocular Movement       Right Left    Full, Ortho Full, Ortho         Neuro/Psych     Oriented x3: Yes   Mood/Affect: Normal         Dilation     Both eyes: 1.0% Mydriacyl, 2.5% Phenylephrine @ 10:42 AM           Slit Lamp and Fundus Exam     Slit Lamp Exam       Right Left    Lids/Lashes Dermatochalasis - upper lid Normal   Conjunctiva/Sclera mild melanosis White and quiet   Cornea Trace Punctate epithelial erosions Trace  Punctate epithelial erosions   Anterior Chamber deep and clear Deep and quiet   Iris Round and dilated, No NVI Round and dilated, no NVI   Lens 1-2+ Nuclear sclerosis, 1-2+ Cortical cataract, 2+Posterior subcapsular cataract PC IOL in good position   Anterior Vitreous scattered fibrosis superior to disc, subhyaloid heme, old white VH inferiorly Syneresis, white blood stained vitreous condensations settling inferiorly and improving         Fundus Exam       Right Left   Disc +fibrosis, +punctate hemes, attenuated disc vessels 2-3+Pallor, Sharp rim, Compact, +fibrosis, NVD - regressed, no heme   C/D Ratio 0.1 0.2   Macula Obscured by subhyaloid heme Flat, blunted foveal reflex, atrophic, +ERM, flat choroidal nevus superior macula   Vessels Severe attenuation, Tortuous, +fibrosis, +NV inferior to disc -- regressing Sclerotic arterioles, Tortuous, +NV -- regressing, fibrosis along superior arcades, severe Vascular attenuation   Periphery Grossly attached Attached; 360 PRP in place, good posterior fill in, scattered tractional fibrosis, scattered MA/DBH           IMAGING AND PROCEDURES  Imaging and Procedures for _0 @  OCT, Retina - OU - Both Eyes       Right Eye Progression has worsened. Findings include (No images obtained today).   Left Eye Quality was good. Progression has been stable. Findings include no IRF, no SRF, abnormal foveal contour, intraretinal hyper-reflective material, epiretinal membrane, vitreous traction, inner retinal atrophy, outer retinal atrophy, vitreomacular adhesion , preretinal fibrosis (persistent retinal atrophy, tractional edema nasal to disc).   Notes *Images captured and stored on drive  Diagnosis / Impression:  PDR w/ tractional fibrosis OU OD: no images obtained today  OS: persistent retinal  atrophy, tractional edema nasal to disc  Clinical management:  See below  Abbreviations: NFP - Normal foveal profile. CME - cystoid macular edema. PED - pigment epithelial detachment. IRF - intraretinal fluid. SRF - subretinal fluid. EZ - ellipsoid zone. ERM - epiretinal membrane. ORA - outer retinal atrophy. ORT - outer retinal tubulation. SRHM - subretinal hyper-reflective material       Intravitreal Injection, Pharmacologic Agent - OD - Right Eye       Time Out 04/13/2022. 11:14 AM. Confirmed correct patient, procedure, site, and patient consented.   Anesthesia Topical anesthesia was used. Anesthetic medications included Lidocaine 2%, Proparacaine 0.5%.   Procedure Preparation included 5% betadine to ocular surface, eyelid speculum. A (32g) needle was used.   Injection: 1.25 mg Bevacizumab 1.77m/0.05ml   Route: Intravitreal, Site: Right Eye   NDC: 50242-060-01, Lot:: [id number] Expiration date: 05/11/2022   Post-op Post injection exam found visual acuity of at least counting fingers. The patient tolerated the procedure well. There were no complications. The patient received written and verbal post procedure care education. Post injection medications were not given.            ASSESSMENT/PLAN:   ICD-10-CM   1. Proliferative diabetic retinopathy of both eyes with macular edema associated with type 2 diabetes mellitus (HCC)  E11.3513 OCT, Retina - OU - Both Eyes    Intravitreal Injection, Pharmacologic Agent - OD - Right Eye    Bevacizumab (AVASTIN) SOLN 1.25 mg    2. Vitreous hemorrhage of both eyes (HCC)  H43.13 OCT, Retina - OU - Both Eyes    Intravitreal Injection, Pharmacologic Agent - OD - Right Eye    Bevacizumab (AVASTIN) SOLN 1.25 mg    3. Essential hypertension  I10     4. Hypertensive retinopathy of both  eyes  H35.033     5. Acute CVA (cerebrovascular accident) ([place])  I63.9     6. Ocular hypertension, bilateral  H40.053     7. Combined forms of  age-related cataract of right eye  H25.811     8. Pseudophakia  Z96.1      1,2. Proliferative diabetic retinopathy OU  - slightly delayed f/u -- missed appt on Friday, 10.27.23  - presents acutely today for 1 day history of decreased vision OD -- recurrent VH / subhyaloid heme  - last A1c was 11.9 on 05.11.23; 9.6 on 11.29.22; 7.8 on 7.20.22; 7.2 on 01.13.22  - s/p PRP OD (10.02.19), (01.22.20), (02.12.20), (03.08.22)  - s/p PRP OS (05/02/2018), (02.19.20), (2.17.22)  - s/p IVK #1 OD (01.22.20), OS (02.12.20)  - s/p IVA OD #1 (10.21.20), #2 (12.02.20), #3 (01.13.21), #4 (02.10.21), #5 (04.19.21), #6 (05.21.21), #7 (06.21.21), #8 (08.19.21), #9 (10.11.21), #10 (12.01.21), #11 (01.25.22), #12 (03.01.22), #13 (04.05.22), #14 (6.6.22), #15 (09.22.22), #16 (03.28.23), #17 (08.04.23)  - s/p IVA OS #1 (12.02.20), #2 (04.19.21), #3 (05.21.21), #4 (06.21.21), #5 (08.19.21), #6 (10.11.21), #7 (12.01.21), #8 (01.25.22), #9 (03.01.22), #10 (04.05.22), #11 (6.6.22), #12 (09.22.22)   - FA 8.19.19 shows leaking MA, patches of capillary nonperfusion OU  - repeat FA 10.11.21 shows persistent NV OU, large patches of vascular nonperfusion OU (OS>OD) -- fill in PRP completed OU (OS  2.17.22, OD 3.8.22)  - repeat FA 9.13.22 shows interval improvement in NV/leakage  - BCVA OD: CF - decreased from 20/40 on 08.04.23; OS: 20/100 -- stable  - exam shows new subhyaloid and vitreous hemorrhage OD, persistent preretinal fibrosis OU  - OCT shows OD: no images obtained today; OS: persistent retinal atrophy, tractional edema nasal to disc  - pt no longer on heparin, but took some ibuprofen around time of onset of visual symptoms  - discussed possibility of surgery being needed in the future to stabilize eye and prevent further progression of diabetic eye disease - would recommend cataract surgery first  - recommend IVA OD #18  today, 11.01.23 for new VH / subhyaloid heme  - will hold off on left eye injection today  - pt  wishes to proceed with injection OD  - RBA of procedure discussed, questions answered - informed consent obtained and signed - see procedure note - Avastin informed consent form signed and scanned on 01.13.2021  - f/u as scheduled on Nov.7, DFE, OCT  3,4. 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 [name] in mid August 2019 with a 2 wk history intractable headaches and decreased vision OU  - found to have BP of 170s/90s -- sent to ED and was [facility] to [facility] where SBP was up to 200  - discussed importance of tight BP control   - pt reports improved compliance with BP meds and BP readings have improved  5. 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 Limited 2 sequence MRI head: Acute RIGHT periatrial subcentimeter  nonhemorrhagic infarct, in region of optic tract.  - could be limiting vision--will check formal VF once macular edema improves  6. Ocular Hypertention OU  - today, IOP 16,14  - patient reports not taking cosopt for several months--IOP ok OU  7. Mixed cataract OD - The symptoms of cataract, surgical options, and treatments and risks were discussed with patient. - discussed diagnosis and progression - clear from a retina standpoint to proceed with cataract surgery when both  patient and surgeon are ready  8. Pseudophakia OS  - s/p CE/IOL (07.29.21, Dr. Kathlen Mody)  - IOL in good position, doing well - monitor   Ophthalmic Meds Ordered this visit:  Meds ordered this encounter  Medications   Bevacizumab (AVASTIN) SOLN 1.25 mg     Return in about 6 days (around 04/19/2022) for f/u as scheduled next Tuesday, PDR OU, DFE, OCT.  There are no Patient Instructions on file for this visit.   This document serves as a record of services personally performed by Gardiner Sleeper, MD, PhD. It was created on their behalf by Orvan Falconer, an ophthalmic technician. The creation of this record is the  provider's dictation and/or activities during the visit.    Electronically signed by: Orvan Falconer, OA, 04/13/22  12:30 PM  Gardiner Sleeper, M.D., Ph.D. Diseases & Surgery of the Retina and Vitreous [facility]  I have reviewed the above documentation for accuracy and completeness, and I agree with the above. Gardiner Sleeper, M.D., Ph.D. 04/13/22 12:30 PM   Abbreviations: M myopia (nearsighted); A astigmatism; H hyperopia (farsighted); P presbyopia; Mrx spectacle prescription;  CTL contact lenses; OD right eye; OS left eye; OU both eyes  XT exotropia; ET esotropia; PEK punctate epithelial keratitis; PEE punctate epithelial erosions; DES dry eye syndrome; MGD meibomian gland dysfunction; ATs artificial tears; PFAT's preservative free artificial tears; [place] nuclear sclerotic cataract; PSC posterior subcapsular cataract; ERM epi-retinal membrane; PVD posterior vitreous detachment; RD retinal detachment; DM diabetes mellitus; DR diabetic retinopathy; NPDR non-proliferative diabetic retinopathy; PDR proliferative diabetic retinopathy; CSME clinically significant macular edema; DME diabetic macular edema; dbh dot blot hemorrhages; CWS cotton wool spot; POAG primary open angle glaucoma; C/D cup-to-disc ratio; HVF humphrey visual field; GVF goldmann visual field; OCT optical coherence tomography; IOP intraocular pressure; BRVO Branch retinal vein occlusion; CRVO central retinal vein occlusion; CRAO central retinal artery occlusion; BRAO branch retinal artery occlusion; RT retinal tear; SB scleral buckle; PPV pars plana vitrectomy; VH Vitreous hemorrhage; PRP panretinal laser photocoagulation; IVK intravitreal kenalog; VMT vitreomacular traction; MH Macular hole;  NVD neovascularization of the disc; NVE neovascularization elsewhere; AREDS age related eye disease study; ARMD age related macular degeneration; POAG primary open angle glaucoma; EBMD epithelial/anterior basement membrane  dystrophy; ACIOL anterior chamber intraocular lens; IOL intraocular lens; PCIOL posterior chamber intraocular lens; Phaco/IOL phacoemulsification with intraocular lens placement; [place] photorefractive keratectomy; LASIK laser assisted in situ keratomileusis; HTN hypertension; DM diabetes mellitus; COPD chronic obstructive pulmonary disease

## 2022-04-19 ENCOUNTER — Encounter (INDEPENDENT_AMBULATORY_CARE_PROVIDER_SITE_OTHER): Payer: Self-pay | Admitting: Ophthalmology

## 2022-04-19 ENCOUNTER — Ambulatory Visit (INDEPENDENT_AMBULATORY_CARE_PROVIDER_SITE_OTHER): Payer: Medicare Other | Admitting: Ophthalmology

## 2022-04-19 DIAGNOSIS — E113513 Type 2 diabetes mellitus with proliferative diabetic retinopathy with macular edema, bilateral: Secondary | ICD-10-CM

## 2022-04-19 DIAGNOSIS — H25811 Combined forms of age-related cataract, right eye: Secondary | ICD-10-CM

## 2022-04-19 DIAGNOSIS — H35033 Hypertensive retinopathy, bilateral: Secondary | ICD-10-CM | POA: Diagnosis not present

## 2022-04-19 DIAGNOSIS — Z961 Presence of intraocular lens: Secondary | ICD-10-CM

## 2022-04-19 DIAGNOSIS — H4313 Vitreous hemorrhage, bilateral: Secondary | ICD-10-CM | POA: Diagnosis not present

## 2022-04-19 DIAGNOSIS — I10 Essential (primary) hypertension: Secondary | ICD-10-CM | POA: Diagnosis not present

## 2022-04-19 DIAGNOSIS — H40053 Ocular hypertension, bilateral: Secondary | ICD-10-CM

## 2022-04-19 DIAGNOSIS — I639 Cerebral infarction, unspecified: Secondary | ICD-10-CM

## 2022-04-20 ENCOUNTER — Encounter (INDEPENDENT_AMBULATORY_CARE_PROVIDER_SITE_OTHER): Payer: Self-pay | Admitting: Ophthalmology

## 2022-05-03 NOTE — Progress Notes (Signed)
[facility] Note  05/11/2022     CHIEF COMPLAINT Patient presents for Retina Follow Up   HISTORY OF PRESENT ILLNESS: Bill [name] is a 41 y.o. male who presents to the clinic today for:  HPI     Retina Follow Up   Patient presents with  Other.  In right eye.  This started months ago.  Duration of 3.  I, the attending physician,  performed the HPI with the patient and updated documentation appropriately.        Comments   Patient feels that the vision is not any better. He is not using any eye drops at this time. There has been no changes in general health. His blood sugar was 200 and he is unsure of his A1C.       Last edited by [name], MD on 05/11/2022 10:48 PM.    Pt states vision is getting better, but "it's moving slow"   Referring physician: Isaac Bliss, Rayford Halsted, MD [place],  [place] [id number]  HISTORICAL INFORMATION:   Selected notes from the MEDICAL RECORD NUMBER Referred by Dr. [name] for concern of severe NPDR / PDR OU   CURRENT MEDICATIONS: No current outpatient medications on file. (Ophthalmic Drugs)   No current facility-administered medications for this visit. (Ophthalmic Drugs)   Current Outpatient Medications (Other)  Medication Sig   aspirin 81 MG EC tablet Take 81 mg by mouth as needed. (Patient not taking: Reported on 04/13/2022)   atorvastatin (LIPITOR) 10 MG tablet TAKE 1 TABLET BY MOUTH EVERY DAY   AURYXIA 1 GM 210 MG(Fe) tablet Take 210 mg by mouth 3 (three) times daily with meals.   Dulaglutide (TRULICITY) 0.62 IR/4.8NI SOPN Inject 0.75 mg into the skin once a week.   ferric citrate (AURYXIA) 1 GM 210 MG(Fe) tablet Take 210 mg by mouth as directed.   fluticasone (FLONASE) 50 MCG/ACT nasal spray Place 1 spray into both nostrils daily as needed for allergies or rhinitis.   multivitamin (RENA-VIT) TABS tablet Take 1 tablet by mouth daily.   sertraline (ZOLOFT) 50 MG tablet TAKE  1 TABLET BY MOUTH EVERY DAY   No current facility-administered medications for this visit. (Other)   REVIEW OF SYSTEMS: ROS   Positive for: Neurological, Genitourinary, Endocrine, Cardiovascular, Eyes, Psychiatric Negative for: Constitutional, Gastrointestinal, Skin, Musculoskeletal, HENT, Respiratory, Allergic/Imm, Heme/Lymph Last edited by Annie Paras, COT on 05/11/2022  9:48 AM.      ALLERGIES Allergies  Allergen Reactions   Food     Melons-itchy/headaches   PAST MEDICAL HISTORY Past Medical History:  Diagnosis Date   Anemia    Anxiety    situational   Diabetes mellitus without complication ([place])    Diabetic retinopathy ([place])    PDR OU   ESRD on dialysis ([place])    Dialyzing T-Th-S   History of blood transfusion    Hypertension    PONV (postoperative nausea and vomiting)    03/22/2019- only 15 years ago   Renal disorder    Stroke [facility]) 2019   vision   Past Surgical History:  Procedure Laterality Date   A/V FISTULAGRAM N/A 03/11/2019   Procedure: A/V FISTULAGRAM - Left Arm;  Surgeon: Waynetta Sandy, MD;  Location: [facility];  Service: Cardiovascular;  Laterality: N/A;   arm surgery Right    Fracture   AV FISTULA PLACEMENT Left 01/25/2019   Procedure: ARTERIOVENOUS (AV) FISTULA CREATION  LEFT UPPER  ARM;  Surgeon: Serafina Mitchell, MD;  Location: [facility] OR;  Service: Vascular;  Laterality: Left;   [place] Left 03/27/2019   Procedure: SECOND STAGE BASILIC VEIN TRANSPOSITION LEFT ARM;  Surgeon: Serafina Mitchell, MD;  Location: [facility] OR;  Service: Vascular;  Laterality: Left;   KIDNEY TRANSPLANT  11/05/2020   [facility]   WISDOM TOOTH EXTRACTION     FAMILY HISTORY Family History  Problem Relation Age of Onset   Diabetes Mellitus II Mother    Hypertension Mother    Cataracts Mother    Diabetes Mellitus II Father    Stroke Father    Hypertension Father    Glaucoma Maternal Grandmother    Amblyopia Neg Hx     Blindness Neg Hx    Macular degeneration Neg Hx    Retinal detachment Neg Hx    Strabismus Neg Hx    Retinitis pigmentosa Neg Hx    SOCIAL HISTORY Social History   Tobacco Use   Smoking status: Never   Smokeless tobacco: Never  Vaping Use   Vaping Use: Never used  Substance Use Topics   Alcohol use: Not Currently   Drug use: Not Currently       OPHTHALMIC EXAM:  Base Eye Exam     Visual Acuity (Snellen - Linear)       Right Left   Dist [place] 20/300 20/80   Dist ph [place] NI NI         Tonometry (Tonopen, 9:51 AM)       Right Left   Pressure 13 14         Pupils       Dark Light Shape React APD   Right 3 2 Round Brisk None   Left 3 2 Round Brisk None         Visual Fields       Left Right    Full Full         Extraocular Movement       Right Left    Full, Ortho Full, Ortho         Neuro/Psych     Oriented x3: Yes   Mood/Affect: Normal         Dilation     Both eyes: 1.0% Mydriacyl, 2.5% Phenylephrine @ 9:48 AM           Slit Lamp and Fundus Exam     Slit Lamp Exam       Right Left   Lids/Lashes Dermatochalasis - upper lid Normal   Conjunctiva/Sclera mild melanosis White and quiet   Cornea Trace Punctate epithelial erosions Trace Punctate epithelial erosions   Anterior Chamber deep and clear Deep and quiet   Iris Round and dilated, No NVI Round and dilated, no NVI   Lens 1-2+ Nuclear sclerosis, 1-2+ Cortical cataract, 2+Posterior subcapsular cataract PC IOL in good position   Anterior Vitreous scattered fibrosis superior to disc, subhyaloid heme -- slightly improved, still quite dense, old white VH inferiorly, blood stained vitreous condensations -- slightly increased and persisting centrally Syneresis, white blood stained vitreous condensations settling inferiorly and improving         Fundus Exam       Right Left   Disc +fibrosis, +punctate hemes, attenuated disc vessels 2-3+Pallor, Sharp rim, Compact, +fibrosis, NVD -  regressed, no heme   C/D Ratio 0.1 0.2   Macula Obscured by blood stained vitreous condensations, grossly flat Flat, blunted foveal reflex, atrophic, +ERM, flat choroidal nevus  superior macula   Vessels Severe attenuation, Tortuous, +fibrosis, +NV inferior to disc -- regressing Sclerotic arterioles, Tortuous, +NV -- regressing, fibrosis along superior arcades, severe Vascular attenuation   Periphery Grossly attached, pre-retinal heme settled inferiorly Attached; 360 PRP in place, good posterior fill in, scattered tractional fibrosis, scattered MA/DBH           IMAGING AND PROCEDURES  Imaging and Procedures for @TODAY @  OCT, Retina - OU - Both Eyes       Right Eye Quality was borderline. Central Foveal Thickness: 364. Progression has worsened. Findings include (interval increase in vitreous opacities, persistent tractional edema).   Left Eye Quality was good. Central Foveal Thickness: 281. Progression has been stable. Findings include no IRF, no SRF, abnormal foveal contour, intraretinal hyper-reflective material, epiretinal membrane, vitreous traction, inner retinal atrophy, outer retinal atrophy, vitreomacular adhesion , preretinal fibrosis (persistent retinal atrophy, tractional edema nasal to disc).   Notes *Images captured and stored on drive  Diagnosis / Impression:  PDR w/ tractional fibrosis OU OD: interval increase in vitreous opacities, persistent tractional edema OS: persistent retinal atrophy, tractional edema nasal to disc  Clinical management:  See below  Abbreviations: NFP - Normal foveal profile. CME - cystoid macular edema. PED - pigment epithelial detachment. IRF - intraretinal fluid. SRF - subretinal fluid. EZ - ellipsoid zone. ERM - epiretinal membrane. ORA - outer retinal atrophy. ORT - outer retinal tubulation. SRHM - subretinal hyper-reflective material       Intravitreal Injection, Pharmacologic Agent - OD - Right Eye       Time Out 05/11/2022. 11:28  AM. Confirmed correct patient, procedure, site, and patient consented.   Anesthesia Topical anesthesia was used. Anesthetic medications included Lidocaine 2%, Proparacaine 0.5%.   Procedure Preparation included 5% betadine to ocular surface, eyelid speculum. A supplied (32g) needle was used.   Injection: 1.25 mg Bevacizumab 1.25mg /0.60ml   Route: Intravitreal, Site: Right Eye   NDC: H061816, Lot: [id number] , Expiration date: 06/29/2022   Post-op Post injection exam found visual acuity of at least counting fingers. The patient tolerated the procedure well. There were no complications. The patient received written and verbal post procedure care education. Post injection medications were not given.            ASSESSMENT/PLAN:   ICD-10-CM   1. Proliferative diabetic retinopathy of both eyes with macular edema associated with type 2 diabetes mellitus (HCC)  E11.3513 OCT, Retina - OU - Both Eyes    Intravitreal Injection, Pharmacologic Agent - OD - Right Eye    Bevacizumab (AVASTIN) SOLN 1.25 mg    2. Vitreous hemorrhage of both eyes ([place])  H43.13     3. Essential hypertension  I10     4. Hypertensive retinopathy of both eyes  H35.033     5. Acute CVA (cerebrovascular accident) ([place])  I63.9     6. Ocular hypertension, bilateral  H40.053     7. 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 Pseudophakia  Z96.1       1,2. Proliferative diabetic retinopathy OU  - last A1c was 11.9 on 05.11.23; 9.6 on 11.29.22; 7.8 on 7.20.22; 7.2 on 01.13.22  - s/p PRP OD (10.02.19), (01.22.20), (02.12.20), (03.08.22)  - s/p PRP OS (05/02/2018), (02.19.20), (2.17.22)  - s/p IVK #1 OD (01.22.20), OS (02.12.20)  - s/p IVA OD #1 (10.21.20), #2 (12.02.20), #3 (01.13.21), #4 (02.10.21), #5 (04.19.21), #6 (05.21.21), #7 (06.21.21), #8 (08.19.21), #9 (10.11.21), #10 (12.01.21), #11 (01.25.22), #12 (  03.01.22), #13 (04.05.22), #14 (6.6.22), #15 (09.22.22), #16 (03.28.23), #17  (08.04.23), #18 (11.01.23 for VH)  - s/p IVA OS #1 (12.02.20), #2 (04.19.21), #3 (05.21.21), #4 (06.21.21), #5 (08.19.21), #6 (10.11.21), #7 (12.01.21), #8 (01.25.22), #9 (03.01.22), #10 (04.05.22), #11 (6.6.22), #12 (09.22.22)   - FA 8.19.19 shows leaking MA, patches of capillary nonperfusion OU  - repeat FA 10.11.21 shows persistent NV OU, large patches of vascular nonperfusion OU (OS>OD) -- fill in PRP completed OU (OS  2.17.22, OD 3.8.22)  - repeat FA 9.13.22 shows interval improvement in NV/leakage  - BCVA OD: 20/300 from CF, OS: 20/80  - exam shows interval increase in VH  - OCT shows OD: interval increase in vitreous opacities, persistent tractional edema; OS: persistent retinal atrophy, tractional edema nasal to disc  - discussed surgery to clear VH and removal of tractional preretinal fibrosis  - recommend 25g PPV w/ membrane peel and endolaser OD under  general anesthesia  - RBA of procedure discussed, questions answered - informed consent obtained and signed - pt wishes to schedule surgery for Dec 14 - recommend IVA OD #19 today, 11.29.23  - pt wishes to proceed with injection  - RBA of procedure discussed, questions answered - see procedure note - Avastin informed consent form signed and scanned on 01.13.2021  - f/u Dec 15 for POV1  3,4. 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 [name] in mid August 2019 with a 2 wk history intractable headaches and decreased vision OU  - found to have BP of 170s/90s -- sent to ED and was admitted to [facility] where SBP was up to 200  - discussed importance of tight BP control   - pt reports improved compliance with BP meds and BP readings have improved  5. 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 Limited 2 sequence MRI head: Acute RIGHT periatrial subcentimeter  nonhemorrhagic infarct, in region of optic tract.  - could be limiting vision--will check formal VF  once macular edema improves  6. Ocular Hypertention OU  - today, IOP 13,14  - patient reports not taking cosopt for several months--IOP ok OU  7. Mixed cataract OD - The symptoms of cataract, surgical options, and treatments and risks were discussed with patient. - discussed diagnosis and progression  8. Pseudophakia OS  - s/p CE/IOL (07.29.21, Dr. Kathlen Mody)  - IOL in good position, doing well - monitor   Ophthalmic Meds Ordered this visit:  Meds ordered this encounter  Medications   Bevacizumab (AVASTIN) SOLN 1.25 mg     Return in about 16 days (around 05/27/2022) for POV.  There are no Patient Instructions on file for this visit.   This document serves as a record of services personally performed by Gardiner Sleeper, MD, PhD. It was created on their behalf by Orvan Falconer, an ophthalmic technician. The creation of this record is the provider's dictation and/or activities during the visit.    Electronically signed by: Orvan Falconer, OA, 05/11/22  10:49 PM   Gardiner Sleeper, M.D., Ph.D. Diseases & Surgery of the Retina and Vitreous [facility]  I have reviewed the above documentation for accuracy and completeness, and I agree with the above. Gardiner Sleeper, M.D., Ph.D. 05/11/22 11:01 PM   Abbreviations: M myopia (nearsighted); A astigmatism; H hyperopia (farsighted); P presbyopia; Mrx spectacle prescription;  CTL contact lenses; OD right eye; OS left eye; OU both eyes  XT exotropia; ET esotropia; PEK punctate epithelial keratitis; PEE punctate epithelial erosions; DES dry eye syndrome; MGD meibomian gland dysfunction; ATs artificial tears; PFAT's preservative free artificial tears; [place] nuclear sclerotic cataract; PSC posterior subcapsular cataract; ERM epi-retinal membrane; PVD posterior vitreous detachment; RD retinal detachment; DM diabetes mellitus; DR diabetic retinopathy; NPDR non-proliferative diabetic retinopathy; PDR proliferative diabetic  retinopathy; CSME clinically significant macular edema; DME diabetic macular edema; dbh dot blot hemorrhages; CWS cotton wool spot; POAG primary open angle glaucoma; C/D cup-to-disc ratio; HVF humphrey visual field; GVF goldmann visual field; OCT optical coherence tomography; IOP intraocular pressure; BRVO Branch retinal vein occlusion; CRVO central retinal vein occlusion; CRAO central retinal artery occlusion; BRAO branch retinal artery occlusion; RT retinal tear; SB scleral buckle; PPV pars plana vitrectomy; VH Vitreous hemorrhage; PRP panretinal laser photocoagulation; IVK intravitreal kenalog; VMT vitreomacular traction; MH Macular hole;  NVD neovascularization of the disc; NVE neovascularization elsewhere; AREDS age related eye disease study; ARMD age related macular degeneration; POAG primary open angle glaucoma; EBMD epithelial/anterior basement membrane dystrophy; ACIOL anterior chamber intraocular lens; IOL intraocular lens; PCIOL posterior chamber intraocular lens; Phaco/IOL phacoemulsification with intraocular lens placement; [place] photorefractive keratectomy; LASIK laser assisted in situ keratomileusis; HTN hypertension; DM diabetes mellitus; COPD chronic obstructive pulmonary disease

## 2022-05-11 ENCOUNTER — Encounter (INDEPENDENT_AMBULATORY_CARE_PROVIDER_SITE_OTHER): Payer: Self-pay | Admitting: Ophthalmology

## 2022-05-11 ENCOUNTER — Ambulatory Visit (INDEPENDENT_AMBULATORY_CARE_PROVIDER_SITE_OTHER): Payer: Medicare Other | Admitting: Ophthalmology

## 2022-05-11 DIAGNOSIS — H4313 Vitreous hemorrhage, bilateral: Secondary | ICD-10-CM

## 2022-05-11 DIAGNOSIS — H35033 Hypertensive retinopathy, bilateral: Secondary | ICD-10-CM | POA: Diagnosis not present

## 2022-05-11 DIAGNOSIS — E113513 Type 2 diabetes mellitus with proliferative diabetic retinopathy with macular edema, bilateral: Secondary | ICD-10-CM

## 2022-05-11 DIAGNOSIS — H40053 Ocular hypertension, bilateral: Secondary | ICD-10-CM

## 2022-05-11 DIAGNOSIS — I639 Cerebral infarction, unspecified: Secondary | ICD-10-CM

## 2022-05-11 DIAGNOSIS — H25811 Combined forms of age-related cataract, right eye: Secondary | ICD-10-CM

## 2022-05-11 DIAGNOSIS — Z961 Presence of intraocular lens: Secondary | ICD-10-CM

## 2022-05-11 DIAGNOSIS — I10 Essential (primary) hypertension: Secondary | ICD-10-CM | POA: Diagnosis not present

## 2022-05-11 MED ORDER — BEVACIZUMAB CHEMO INJECTION 1.25MG/0.05ML SYRINGE FOR KALEIDOSCOPE
1.2500 mg | INTRAVITREAL | Status: AC | PRN
  Administered 2022-05-11: 1.25 mg via INTRAVITREAL

## 2022-05-15 ENCOUNTER — Other Ambulatory Visit: Payer: Self-pay | Admitting: Internal Medicine

## 2022-05-15 DIAGNOSIS — F411 Generalized anxiety disorder: Secondary | ICD-10-CM

## 2022-05-16 ENCOUNTER — Encounter (HOSPITAL_COMMUNITY): Payer: Self-pay

## 2022-05-16 ENCOUNTER — Ambulatory Visit (INDEPENDENT_AMBULATORY_CARE_PROVIDER_SITE_OTHER): Payer: Medicare Other | Admitting: Ophthalmology

## 2022-05-16 ENCOUNTER — Encounter (INDEPENDENT_AMBULATORY_CARE_PROVIDER_SITE_OTHER): Payer: Self-pay | Admitting: Ophthalmology

## 2022-05-16 DIAGNOSIS — H25811 Combined forms of age-related cataract, right eye: Secondary | ICD-10-CM

## 2022-05-16 DIAGNOSIS — E113513 Type 2 diabetes mellitus with proliferative diabetic retinopathy with macular edema, bilateral: Secondary | ICD-10-CM

## 2022-05-16 DIAGNOSIS — H4313 Vitreous hemorrhage, bilateral: Secondary | ICD-10-CM

## 2022-05-16 DIAGNOSIS — I10 Essential (primary) hypertension: Secondary | ICD-10-CM

## 2022-05-16 DIAGNOSIS — H35033 Hypertensive retinopathy, bilateral: Secondary | ICD-10-CM

## 2022-05-16 DIAGNOSIS — H40053 Ocular hypertension, bilateral: Secondary | ICD-10-CM

## 2022-05-16 DIAGNOSIS — I639 Cerebral infarction, unspecified: Secondary | ICD-10-CM

## 2022-05-16 DIAGNOSIS — Z961 Presence of intraocular lens: Secondary | ICD-10-CM

## 2022-05-16 NOTE — Progress Notes (Signed)
[facility] Note  05/16/2022     CHIEF COMPLAINT Patient presents for Retina Follow Up   HISTORY OF PRESENT ILLNESS: Bill [name] is a 41 y.o. male who presents to the clinic today for:  HPI     Retina Follow Up   Patient presents with  Retinal Break/Detachment.  In left eye.  Severity is moderate.  Duration of 3 days.  Since onset it is rapidly worsening.  I, the attending physician,  performed the HPI with the patient and updated documentation appropriately.        Comments   Pt here for 3 day ret f/u for problem appt OS. Pt states on Friday 12/1 he was hit directly on OS with a basketball. Pt was seen by Dr. Herbert Deaner on Saturday and told there was blood in OS but lens from cataract sx was still in place. VA in OS is blurry and is unable to see well. Can see light presence.       Last edited by Kingsley Spittle, COT on 05/16/2022  8:19 AM.    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 Herbert Deaner on Saturday who told him to follow up here this morning, pt denies fol  Referring physician: Isaac Bliss, Rayford Halsted, MD [address] [place],  [place] [address]  HISTORICAL INFORMATION:   Selected notes from the [facility] Referred by Dr. [name] for concern of severe NPDR / PDR OU   CURRENT MEDICATIONS: No current outpatient medications on file. (Ophthalmic Drugs)   No current facility-administered medications for this visit. (Ophthalmic Drugs)   Current Outpatient Medications (Other)  Medication Sig   atorvastatin (LIPITOR) 10 MG tablet TAKE 1 TABLET BY MOUTH EVERY DAY   AURYXIA 1 GM 210 MG(Fe) tablet Take 210 mg by mouth 3 (three) times daily with meals.   Dulaglutide (TRULICITY) 1.60 FU/9.3AT SOPN Inject 0.75 mg into the skin once a week.   ferric citrate  (AURYXIA) 1 GM 210 MG(Fe) tablet Take 210 mg by mouth as directed.   fluticasone (FLONASE) 50 MCG/ACT nasal spray Place 1 spray into both nostrils daily as needed for allergies or rhinitis.   multivitamin (RENA-VIT) TABS tablet Take 1 tablet by mouth daily.   sertraline (ZOLOFT) 50 MG tablet TAKE 1 TABLET BY MOUTH EVERY DAY   aspirin 81 MG EC tablet Take 81 mg by mouth as needed. (Patient not taking: Reported on 05/16/2022)   No current facility-administered medications for this visit. (Other)   REVIEW OF SYSTEMS: ROS   Positive for: Neurological, Genitourinary, Endocrine, Cardiovascular, Eyes, Psychiatric Negative for: Constitutional, Gastrointestinal, Skin, Musculoskeletal, HENT, Respiratory, Allergic/Imm, Heme/Lymph Last edited by Kingsley Spittle, COT on 05/16/2022  8:19 AM.     ALLERGIES Allergies  Allergen Reactions   Food     Melons-itchy/headaches   PAST MEDICAL HISTORY Past Medical History:  Diagnosis Date   Anemia    Anxiety    situational   Diabetes mellitus without complication ([place])    Diabetic retinopathy ([place])    PDR OU   ESRD on dialysis ([place])    Dialyzing T-Th-S   History of blood transfusion    Hypertension    PONV (postoperative nausea and vomiting)    03/22/2019- only 15 years ago   Renal disorder  Stroke [facility]) 2019   vision   Past Surgical History:  Procedure Laterality Date   A/V FISTULAGRAM N/A 03/11/2019   Procedure: A/V FISTULAGRAM - Left Arm;  Surgeon: Waynetta Sandy, MD;  Location: [facility];  Service: Cardiovascular;  Laterality: N/A;   arm surgery Right    Fracture   AV FISTULA PLACEMENT Left 01/25/2019   Procedure: ARTERIOVENOUS (AV) FISTULA CREATION  LEFT UPPER  ARM;  Surgeon: Serafina Mitchell, MD;  [facility];  Service: Vascular;  Laterality: Left;   [place] Left 03/27/2019   Procedure: SECOND STAGE BASILIC VEIN TRANSPOSITION LEFT ARM;  Surgeon: Serafina Mitchell, MD;  [facility];   Service: Vascular;  Laterality: Left;   KIDNEY TRANSPLANT  11/05/2020   [facility]   WISDOM TOOTH EXTRACTION     FAMILY HISTORY Family History  Problem Relation Age of Onset   Diabetes Mellitus II Mother    Hypertension Mother    Cataracts Mother    Diabetes Mellitus II Father    Stroke Father    Hypertension Father    Glaucoma Maternal Grandmother    Amblyopia Neg Hx    Blindness Neg Hx    Macular degeneration Neg Hx    Retinal detachment Neg Hx    Strabismus Neg Hx    Retinitis pigmentosa Neg Hx    SOCIAL HISTORY Social History   Tobacco Use   Smoking status: Never   Smokeless tobacco: Never  Vaping Use   Vaping Use: Never used  Substance Use Topics   Alcohol use: Not Currently   Drug use: Not Currently       OPHTHALMIC EXAM:  Base Eye Exam     Visual Acuity (Snellen - Linear)       Right Left   Dist [place] 20/300 CF@ 1'   Dist ph Amanda NI NI         Tonometry (Tonopen, 8:27 AM)       Right Left   Pressure 13 13         Pupils       Dark Light Shape React APD   Right 3 2 Round Brisk None   Left 4 3 Round Brisk +1         Visual Fields (Counting fingers)       Left Right     Full   Restrictions Total superior temporal, superior nasal deficiencies; Partial outer inferior temporal, inferior nasal deficiencies          Extraocular Movement       Right Left    Full, Ortho Full, Ortho         Neuro/Psych     Oriented x3: Yes   Mood/Affect: Normal         Dilation     Both eyes: 1.0% Mydriacyl, 2.5% Phenylephrine @ 8:27 AM           Slit Lamp and Fundus Exam     Slit Lamp Exam       Right Left   Lids/Lashes Dermatochalasis - upper lid Normal   Conjunctiva/Sclera mild melanosis White and quiet   Cornea Trace Punctate epithelial erosions Trace Punctate epithelial erosions, well healed cataract wound   Anterior Chamber deep and clear deep, 2-3+fine cell and pigment   Iris Round and dilated, No NVI Round and  dilated, no NVI   Lens 1-2+ Nuclear sclerosis, 1-2+ Cortical cataract, 2+Posterior subcapsular cataract PC IOL in good position   Anterior Vitreous  scattered fibrosis superior to disc, subhyaloid heme -- slightly improved, still quite dense, old white VH inferiorly, blood stained vitreous condensations -- slightly increased and persisting centrally Syneresis, , blood stained vitreous condensations         Fundus Exam       Right Left   Disc +fibrosis, +punctate hemes, attenuated disc vessels No view   C/D Ratio 0.1 0.2   Macula Obscured by blood stained vitreous condensations, grossly flat No view   Vessels Severe attenuation, Tortuous, +fibrosis, +NV inferior to disc -- regressing Sclerotic arterioles, Tortuous, +NV -- regressing, fibrosis along superior arcades, severe Vascular attenuation   Periphery Grossly attached, pre-retinal heme settled inferiorly Hazy view, superior retina visible with PRP           IMAGING AND PROCEDURES  Imaging and Procedures for _0 @  OCT, Retina - OU - Both Eyes       Right Eye Quality was poor. Progression has worsened. Findings include (interval increase in vitreous opacities, persistent tractional edema).   Left Eye Quality was poor. Progression has worsened. Findings include (No line scans obtained).   Notes *Images captured and stored on drive  Diagnosis / Impression:  PDR w/ tractional fibrosis OU OD: interval increase in vitreous opacities, persistent tractional edema OS: no line scans obtained  Clinical management:  See below  Abbreviations: NFP - Normal foveal profile. CME - cystoid macular edema. PED - pigment epithelial detachment. IRF - intraretinal fluid. SRF - subretinal fluid. EZ - ellipsoid zone. ERM - epiretinal membrane. ORA - outer retinal atrophy. ORT - outer retinal tubulation. SRHM - subretinal hyper-reflective material             ASSESSMENT/PLAN:   ICD-10-CM   1. 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 Vitreous hemorrhage of both eyes ([place])  H43.13     3. Essential hypertension  I10     4. Hypertensive retinopathy of both eyes  H35.033     5. Acute CVA (cerebrovascular accident) ([place])  I63.9     6. Ocular hypertension, bilateral  H40.053     7. 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 Pseudophakia  Z96.1      **pt presents acutely today for decreased VA OS after being hit in the left eye with a basketball on Saturday, 12.02.23**  1,2. Proliferative diabetic retinopathy OU  - last A1c was 11.9 on 05.11.23; 9.6 on 11.29.22; 7.8 on 7.20.22; 7.2 on 01.13.22  - s/p PRP OD (10.02.19), (01.22.20), (02.12.20), (03.08.22)  - s/p PRP OS (05/02/2018), (02.19.20), (2.17.22)  - s/p IVK #1 OD (01.22.20), OS (02.12.20)  - s/p IVA OD #1 (10.21.20), #2 (12.02.20), #3 (01.13.21), #4 (02.10.21), #5 (04.19.21), #6 (05.21.21), #7 (06.21.21), #8 (08.19.21), #9 (10.11.21), #10 (12.01.21), #11 (01.25.22), #12 (03.01.22), #13 (04.05.22), #14 (6.6.22), #15 (09.22.22), #16 (03.28.23), #17 (08.04.23), #18 (11.01.23 for [facility]), #19 (11.29.23)  - s/p IVA OS #1 (12.02.20), #2 (04.19.21), #3 (05.21.21), #4 (06.21.21), #5 (08.19.21), #6 (10.11.21), #7 (12.01.21), #8 (01.25.22), #9 (03.01.22), #10 (04.05.22), #11 (6.6.22), #12 (09.22.22)   - FA 8.19.19 shows leaking MA, patches of capillary nonperfusion OU  - repeat FA 10.11.21 shows persistent NV OU, large patches of vascular nonperfusion OU (OS>OD) -- fill in PRP completed OU (OS  2.17.22, OD 3.8.22)  - repeat FA 9.13.22 shows interval improvement in NV/leakage  -  BCVA OD: 20/300 from CF, OS: 20/80  - exam shows new VH OS, persistent VH OD  - OCT shows OD: interval increase in vitreous opacities, persistent tractional edema; OS: no line scans obtained due to new [facility]  - surgery scheduled for right eye, May 26, 2022,  2:30pm, [facility] OR 8 -- moving it forward to this Thursdasy, Dec 7 at 1130 am  - recommend IVA OS #13 today, 12.4.23, for new VH OS  - pt wishes to proceed with injection  - RBA of procedure discussed, questions answered - informed consent obtained and signed - see procedure note - f/u Friday for POV1 OD  3,4. 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 [name] in mid August 2019 with a 2 wk history intractable headaches and decreased vision OU  - found to have BP of 170s/90s -- sent to ED and was admitted to [facility] where SBP was up to 200  - discussed importance of tight BP control   - pt reports improved compliance with BP meds and BP readings have improved  5. 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 Limited 2 sequence MRI head: Acute RIGHT periatrial subcentimeter  nonhemorrhagic infarct, in region of optic tract.  - could be limiting vision--will check formal VF once macular edema improves  6. Ocular Hypertention OU  - today, IOP 13,14  - patient reports not taking cosopt for several months--IOP ok OU  7. Mixed cataract OD - The symptoms of cataract, surgical options, and treatments and risks were discussed with patient. - discussed diagnosis and progression  8. Pseudophakia OS  - s/p CE/IOL (07.29.21, Dr. Kathlen Mody)  - IOL in good position, doing well - monitor   Ophthalmic Meds Ordered this visit:  No orders of the defined types were placed in this encounter.    Return in about 4 days (around 05/20/2022) for POV.  There are no Patient Instructions on file for this visit.   This document serves as a record of services personally performed by Gardiner Sleeper, MD, PhD. It was created on their behalf by San Jetty. Owens Shark, OA an ophthalmic technician. The creation of this record is the provider's dictation and/or activities during the visit.    Electronically signed by: San Jetty. Owens Shark, [place] 12.04.2023 12:10  PM  Gardiner Sleeper, M.D., Ph.D. Diseases & Surgery of the Retina and Vitreous [facility]  I have reviewed the above documentation for accuracy and completeness, and I agree with the above. Gardiner Sleeper, M.D., Ph.D. 05/16/22 12:15 PM  Abbreviations: M myopia (nearsighted); A astigmatism; H hyperopia (farsighted); P presbyopia; Mrx spectacle prescription;  CTL contact lenses; OD right eye; OS left eye; OU both eyes  XT exotropia; ET esotropia; PEK punctate epithelial keratitis; PEE punctate epithelial erosions; DES dry eye syndrome; MGD meibomian gland dysfunction; ATs artificial tears; PFAT's preservative free artificial tears; [place] nuclear sclerotic cataract; PSC posterior subcapsular cataract; ERM epi-retinal membrane; PVD posterior vitreous detachment; RD retinal detachment; DM diabetes mellitus; DR diabetic retinopathy; NPDR non-proliferative diabetic retinopathy; PDR proliferative diabetic retinopathy; CSME clinically significant macular edema; DME diabetic macular edema; dbh dot blot hemorrhages; CWS cotton wool spot; POAG primary open angle glaucoma; C/D cup-to-disc ratio; HVF humphrey visual field; GVF goldmann visual field; OCT optical coherence tomography; IOP intraocular pressure; BRVO Branch retinal vein occlusion; CRVO central retinal vein occlusion; CRAO central retinal artery occlusion; BRAO branch retinal artery occlusion; RT  retinal tear; SB scleral buckle; PPV pars plana vitrectomy; VH Vitreous hemorrhage; PRP panretinal laser photocoagulation; IVK intravitreal kenalog; VMT vitreomacular traction; MH Macular hole;  NVD neovascularization of the disc; NVE neovascularization elsewhere; AREDS age related eye disease study; ARMD age related macular degeneration; POAG primary open angle glaucoma; EBMD epithelial/anterior basement membrane dystrophy; ACIOL anterior chamber intraocular lens; IOL intraocular lens; PCIOL posterior chamber intraocular lens; Phaco/IOL  phacoemulsification with intraocular lens placement; [place] photorefractive keratectomy; LASIK laser assisted in situ keratomileusis; HTN hypertension; DM diabetes mellitus; COPD chronic obstructive pulmonary disease

## 2022-05-16 NOTE — H&P (Signed)
Bill [name] is an 41 y.o. male.    Chief Complaint: decreased vision, right eye  HPI: Pt with history of proliferative diabetic retinopathy OU who developed progressive decrease in vision from a diabetic vitreous hemorrhage OD. On exam, pt has a diffuse vitreous hemorrhage of the right eye, along with scattered areas of vitreous and preretinal fibrosis. After a discussion of the risks benefits and alternatives to surgery, the patient has elected to undergo surgery to clear the vitreous hemorrhage and remove vitreous and preretinal fibrosis -- 25g PPV w/ membrane peel and endolaser OD under general anesthesia.  Past Medical History:  Diagnosis Date   Anemia    Anxiety    situational   Diabetes mellitus without complication ([place])    Diabetic retinopathy ([place])    PDR OU   ESRD on dialysis ([place])    Dialyzing T-Th-S   History of blood transfusion    Hypertension    PONV (postoperative nausea and vomiting)    03/22/2019- only 15 years ago   Renal disorder    Stroke [facility]) 2019   vision   Past Surgical History:  Procedure Laterality Date   A/V FISTULAGRAM N/A 03/11/2019   Procedure: A/V FISTULAGRAM - Left Arm;  Surgeon: Waynetta Sandy, MD;  Location: [facility];  Service: Cardiovascular;  Laterality: N/A;   arm surgery Right    Fracture   AV FISTULA PLACEMENT Left 01/25/2019   Procedure: ARTERIOVENOUS (AV) FISTULA CREATION  LEFT UPPER  ARM;  Surgeon: Serafina Mitchell, MD;  Location: [facility] OR;  Service: Vascular;  Laterality: Left;   [place] Left 03/27/2019   Procedure: SECOND STAGE BASILIC VEIN TRANSPOSITION LEFT ARM;  Surgeon: Serafina Mitchell, MD;  Location: [facility];  Service: Vascular;  Laterality: Left;   KIDNEY TRANSPLANT  11/05/2020   [facility]   WISDOM TOOTH EXTRACTION     Family History  Problem Relation Age of Onset   Diabetes Mellitus II Mother    Hypertension Mother    Cataracts Mother    Diabetes Mellitus II Father    Stroke  Father    Hypertension Father    Glaucoma Maternal Grandmother    Amblyopia Neg Hx    Blindness Neg Hx    Macular degeneration Neg Hx    Retinal detachment Neg Hx    Strabismus Neg Hx    Retinitis pigmentosa Neg Hx    Social History:  reports that he has never smoked. He has never used smokeless tobacco. He reports that he does not currently use alcohol. He reports that he does not currently use drugs.  Allergies:  Allergies  Allergen Reactions   Food     Melons-itchy/headaches    No medications prior to admission.    Review of systems otherwise negative  There were no vitals taken for this visit.  Physical exam: Mental status: oriented x3. Eyes: See eye exam associated with this date of surgery Ears, Nose, Throat: within normal limits Neck: Within Normal limits General: within normal limits Chest: Within normal limits Breast: deferred Heart: Within normal limits Abdomen: Within normal limits GU: deferred Extremities: within normal limits Skin: within normal limits  Assessment/Plan Proliferative diabetic retinopathy with vitreous hemorrhage, RIGHT EYE  Plan: To [facility] for 25g PPV w/ membrane peel and endolaser, RIGHT EYE, under general anesthesia - case scheduled for Thursday, 12.07.23, [facility] OR 08  Gardiner Sleeper, M.D., Ph.D. Vitreoretinal Surgeon [facility]

## 2022-05-18 ENCOUNTER — Encounter (HOSPITAL_COMMUNITY): Payer: Self-pay | Admitting: Ophthalmology

## 2022-05-18 NOTE — Anesthesia Preprocedure Evaluation (Signed)
Anesthesia Evaluation  Patient identified by MRN, date of birth, ID band Patient awake    Reviewed: Allergy & Precautions, H&P , NPO status , Patient's Chart, lab work & pertinent test results  History of Anesthesia Complications (+) PONV and history of anesthetic complications  Airway Mallampati: II   Neck ROM: full    Dental   Pulmonary neg pulmonary ROS   breath sounds clear to auscultation       Cardiovascular hypertension,  Rhythm:regular Rate:Normal     Neuro/Psych  Headaches  Anxiety     CVA    GI/Hepatic   Endo/Other  diabetes, Type 2    Renal/GU ESRF and DialysisRenal disease     Musculoskeletal   Abdominal   Peds  Hematology   Anesthesia Other Findings   Reproductive/Obstetrics                             Anesthesia Physical Anesthesia Plan  ASA: 4  Anesthesia Plan: General   Post-op Pain Management:    Induction: Intravenous  PONV Risk Score and Plan: 3 and Ondansetron, Dexamethasone, Midazolam and Treatment may vary due to age or medical condition  Airway Management Planned: Oral ETT  Additional Equipment:   Intra-op Plan:   Post-operative Plan: Extubation in OR  Informed Consent: I have reviewed the patients History and Physical, chart, labs and discussed the procedure including the risks, benefits and alternatives for the proposed anesthesia with the patient or authorized representative who has indicated his/her understanding and acceptance.     Dental advisory given  Plan Discussed with: CRNA, Anesthesiologist and Surgeon  Anesthesia Plan Comments: (PAT note written 05/18/2022 by Myra Gianotti, PA-C.  )       Anesthesia Quick Evaluation

## 2022-05-18 NOTE — Progress Notes (Signed)
Anesthesia Chart Review: Bill [name]  Case: [id number] Date/Time: 05/19/22 1115   Procedures:      PARS PLANA VITRECTOMY WITH 25 GAUGE (Right)     MEMBRANE PEEL (Right)   Anesthesia type: General   Pre-op diagnosis: vitreous hemorrhage, right eye   Location: [facility] OR ROOM 08 / [facility]   Surgeons: [name], MD       DISCUSSION: Patient is a 41 year old male scheduled for the above procedure.  History includes ESRD (s/p living unrelated donor renal transplant 11/05/20), DM2, retinopathy, HTN, CVA (11/05/20).  Meds include Trulicity, Lipitor, Lantus, Myfortic, prednisone, Zoloft, Bactrim MWF, Prograf. RN staff to clarify last Trulicity dose when they call him today (05/18/22).  He is a same day work-up, so anesthesia team to evaluate on the day of surgery.    PROVIDERS: Isaac Bliss, Rayford Halsted, MD is PCP  Dr. Harrie Jeans is nephrologist. ALso seen at [facility] follow up 01/25/22 with one year follow-up planned.    LABS: Labs on arrival as indicated. As of 01/25/22 ([facility]), glucose 144, Creatinine 1.6, AST 11, ALT 8, WBC 3.8, PLT 172, H/H 11.4/35.7.   EKG: Last tracing available is > 1 year ago.   CV: Nuclear stress test 01/01/20 ([facility]): 1.) LIMITATIONS: None.  2.) MYOCARDIAL PERFUSION: No definite evidence for inducible transmural ischemia or a transmural scar  3.) LEFT VENTRICULAR EJECTION FRACTION: Normal.  4.) REGIONAL WALL MOTION: Normal.   Echo 11/29/19 ([facility]): SUMMARY  The left ventricular size is normal.  There is mild concentric left ventricular hypertrophy.  Left ventricular systolic function is normal.  LV ejection fraction = 60-65%.  Left ventricular filling pattern is prolonged relaxation.  The left ventricular wall motion is normal.  The right ventricle is normal in size and function.  There is mild mitral regurgitation.  The aortic sinus is normal size.  IVC size was normal.  There is no pericardial effusion.   Past  Medical History:  Diagnosis Date   Anemia    Anxiety    situational   Diabetes mellitus without complication ([place])    Diabetic retinopathy ([place])    PDR OU   ESRD on dialysis ([place])    living unrelated donor renal transplant 11/05/20   History of blood transfusion    Hypertension    PONV (postoperative nausea and vomiting)    03/22/2019- only 15 years ago   Renal disorder    Stroke [facility]) 2019   vision    Past Surgical History:  Procedure Laterality Date   A/V FISTULAGRAM N/A 03/11/2019   Procedure: A/V FISTULAGRAM - Left Arm;  Surgeon: Waynetta Sandy, MD;  Location: [place] [facility];  Service: Cardiovascular;  Laterality: N/A;   arm surgery Right    Fracture   AV FISTULA PLACEMENT Left 01/25/2019   Procedure: ARTERIOVENOUS (AV) FISTULA CREATION  LEFT UPPER  ARM;  Surgeon: Serafina Mitchell, MD;  Location: [facility];  Service: Vascular;  Laterality: Left;   [place] Left 03/27/2019   Procedure: SECOND STAGE BASILIC VEIN TRANSPOSITION LEFT ARM;  Surgeon: Serafina Mitchell, MD;  Location: [facility];  Service: Vascular;  Laterality: Left;   KIDNEY TRANSPLANT  11/05/2020   [place] EXTRACTION      MEDICATIONS: No current facility-administered medications for this encounter.    atorvastatin (LIPITOR) 10 MG tablet   cholecalciferol (VITAMIN D-400) 10 MCG (400 UNIT) TABS tablet   Dulaglutide (TRULICITY) 5.88  MG/0.5ML SOPN   fluticasone (FLONASE) 50 MCG/ACT nasal spray   Insulin Glargine Solostar (LANTUS) 100 UNIT/ML Solostar Pen   mycophenolate (MYFORTIC) 180 MG EC tablet   predniSONE (DELTASONE) 5 MG tablet   sertraline (ZOLOFT) 50 MG tablet   sulfamethoxazole-trimethoprim (BACTRIM) 400-80 MG tablet   tacrolimus (PROGRAF) 1 MG capsule    Myra Gianotti, PA-C Surgical Short Stay/Anesthesiology [facility] Phone [phone] [facility] Phone [phone] 05/18/2022 4:13 PM

## 2022-05-18 NOTE — Progress Notes (Signed)
PCP - Dr Lelon Frohlich Cardiologist - n/a  Chest x-ray - n/a EKG - DOS Stress Test - 01/01/20 [facility] ECHO - 10/08/18 Cardiac Cath - n/a  ICD Pacemaker/Loop - n/a  Sleep Study -  n/a CPAP - none  Do not take Trulicity on the morning of surgery.  THE NIGHT BEFORE SURGERY, take 50% of your Lantus evening dose - 3-6 units.     If your blood sugar is less than 70 mg/dL, you will need to treat for low blood sugar: Treat a low blood sugar (less than 70 mg/dL) with  cup of clear juice (cranberry or apple), 4 glucose tablets, OR glucose gel. Recheck blood sugar in 15 minutes after treatment (to make sure it is greater than 70 mg/dL). If your blood sugar is not greater than 70 mg/dL on recheck, call [phone] for further instructions.  ERAS: NPO  Anesthesia review: Yes  STOP now taking any Aspirin (unless otherwise instructed by your surgeon), Aleve, Naproxen, Ibuprofen, Motrin, Advil, Goody's, BC's, all herbal medications, fish oil, and all vitamins.   Coronavirus Screening Do you have any of the following symptoms:  Cough yes/no: No Fever (>100.68F)  yes/no: No Runny nose yes/no: No Sore throat yes/no: No Difficulty breathing/shortness of breath  yes/no: No  Have you traveled in the last 14 days and where? yes/no: No  Patient verbalized understanding of instructions that were given via phone.

## 2022-05-19 ENCOUNTER — Encounter (HOSPITAL_COMMUNITY): Payer: Self-pay | Admitting: Ophthalmology

## 2022-05-19 ENCOUNTER — Other Ambulatory Visit: Payer: Self-pay | Admitting: Internal Medicine

## 2022-05-19 ENCOUNTER — Ambulatory Visit (HOSPITAL_BASED_OUTPATIENT_CLINIC_OR_DEPARTMENT_OTHER): Payer: Medicare Other | Admitting: Vascular Surgery

## 2022-05-19 ENCOUNTER — Ambulatory Visit (HOSPITAL_COMMUNITY)
Admission: RE | Admit: 2022-05-19 | Discharge: 2022-05-19 | Disposition: A | Discharge status: Home / Self Care | Payer: Medicare Other | Attending: Ophthalmology | Admitting: Ophthalmology

## 2022-05-19 ENCOUNTER — Encounter (HOSPITAL_COMMUNITY)
Admission: RE | Disposition: A | Discharge status: Home / Self Care | Payer: Self-pay | Source: Home / Self Care | Attending: Ophthalmology

## 2022-05-19 ENCOUNTER — Ambulatory Visit (HOSPITAL_COMMUNITY): Payer: Medicare Other | Admitting: Vascular Surgery

## 2022-05-19 ENCOUNTER — Other Ambulatory Visit: Payer: Self-pay

## 2022-05-19 DIAGNOSIS — N186 End stage renal disease: Secondary | ICD-10-CM | POA: Insufficient documentation

## 2022-05-19 DIAGNOSIS — H43821 Vitreomacular adhesion, right eye: Secondary | ICD-10-CM | POA: Diagnosis not present

## 2022-05-19 DIAGNOSIS — E1122 Type 2 diabetes mellitus with diabetic chronic kidney disease: Secondary | ICD-10-CM | POA: Diagnosis not present

## 2022-05-19 DIAGNOSIS — H4311 Vitreous hemorrhage, right eye: Secondary | ICD-10-CM | POA: Insufficient documentation

## 2022-05-19 DIAGNOSIS — E1169 Type 2 diabetes mellitus with other specified complication: Secondary | ICD-10-CM

## 2022-05-19 DIAGNOSIS — F411 Generalized anxiety disorder: Secondary | ICD-10-CM

## 2022-05-19 DIAGNOSIS — Z940 Kidney transplant status: Secondary | ICD-10-CM | POA: Diagnosis not present

## 2022-05-19 DIAGNOSIS — Z833 Family history of diabetes mellitus: Secondary | ICD-10-CM | POA: Insufficient documentation

## 2022-05-19 DIAGNOSIS — I120 Hypertensive chronic kidney disease with stage 5 chronic kidney disease or end stage renal disease: Secondary | ICD-10-CM | POA: Diagnosis not present

## 2022-05-19 DIAGNOSIS — Z8673 Personal history of transient ischemic attack (TIA), and cerebral infarction without residual deficits: Secondary | ICD-10-CM | POA: Diagnosis not present

## 2022-05-19 DIAGNOSIS — Z992 Dependence on renal dialysis: Secondary | ICD-10-CM | POA: Diagnosis not present

## 2022-05-19 DIAGNOSIS — E113593 Type 2 diabetes mellitus with proliferative diabetic retinopathy without macular edema, bilateral: Secondary | ICD-10-CM | POA: Insufficient documentation

## 2022-05-19 DIAGNOSIS — E113511 Type 2 diabetes mellitus with proliferative diabetic retinopathy with macular edema, right eye: Secondary | ICD-10-CM | POA: Diagnosis present

## 2022-05-19 HISTORY — PX: PARS PLANA VITRECTOMY: SHX2166

## 2022-05-19 HISTORY — PX: MEMBRANE PEEL: SHX5967

## 2022-05-19 HISTORY — PX: PHOTOCOAGULATION WITH LASER: SHX6027

## 2022-05-19 LAB — POCT I-STAT, CHEM 8
BUN: 30 mg/dL — ABNORMAL HIGH (ref 6–20)
Calcium, Ion: 1.27 mmol/L (ref 1.15–1.40)
Chloride: 109 mmol/L (ref 98–111)
Creatinine, Ser: 1.9 mg/dL — ABNORMAL HIGH (ref 0.61–1.24)
Glucose, Bld: 162 mg/dL — ABNORMAL HIGH (ref 70–99)
HCT: 39 % (ref 39.0–52.0)
Hemoglobin: 13.3 g/dL (ref 13.0–17.0)
Potassium: 4.8 mmol/L (ref 3.5–5.1)
Sodium: 138 mmol/L (ref 135–145)
TCO2: 16 mmol/L — ABNORMAL LOW (ref 22–32)

## 2022-05-19 LAB — GLUCOSE, CAPILLARY
Glucose-Capillary: 162 mg/dL — ABNORMAL HIGH (ref 70–99)
Glucose-Capillary: 149 mg/dL — ABNORMAL HIGH (ref 70–99)
Glucose-Capillary: 139 mg/dL — ABNORMAL HIGH (ref 70–99)

## 2022-05-19 SURGERY — PARS PLANA VITRECTOMY WITH 25 GAUGE
Anesthesia: General | Site: Eye | Laterality: Right

## 2022-05-19 MED ORDER — PROPOFOL 10 MG/ML IV BOLUS
INTRAVENOUS | Status: DC | PRN
  Administered 2022-05-19: 20 mg via INTRAVENOUS
  Administered 2022-05-19: 200 mg via INTRAVENOUS

## 2022-05-19 MED ORDER — GATIFLOXACIN 0.5 % OP SOLN OPTIME - NO CHARGE
OPHTHALMIC | Status: DC | PRN
  Administered 2022-05-19: 1 [drp] via OPHTHALMIC

## 2022-05-19 MED ORDER — NA CHONDROIT SULF-NA HYALURON 40-30 MG/ML IO SOSY
INTRAOCULAR | Status: DC | PRN
  Administered 2022-05-19: .5 mL via INTRAOCULAR

## 2022-05-19 MED ORDER — DORZOLAMIDE HCL-TIMOLOL MAL 2-0.5 % OP SOLN
OPHTHALMIC | Status: DC | PRN
  Administered 2022-05-19: 1 [drp] via OPHTHALMIC

## 2022-05-19 MED ORDER — SUCCINYLCHOLINE CHLORIDE 200 MG/10ML IV SOSY
PREFILLED_SYRINGE | INTRAVENOUS | Status: AC
  Filled 2022-05-19: qty 10

## 2022-05-19 MED ORDER — PHENYLEPHRINE HCL 10 % OP SOLN
1.0000 [drp] | OPHTHALMIC | Status: AC | PRN
  Administered 2022-05-19 (×3): 1 [drp] via OPHTHALMIC
  Filled 2022-05-19: qty 5

## 2022-05-19 MED ORDER — OXYCODONE HCL 5 MG PO TABS: 5.0000 mg | ORAL_TABLET | Freq: Once | ORAL | Status: DC | PRN

## 2022-05-19 MED ORDER — BACITRACIN-POLYMYXIN B 500-10000 UNIT/GM OP OINT
TOPICAL_OINTMENT | OPHTHALMIC | Status: AC
  Filled 2022-05-19: qty 3.5

## 2022-05-19 MED ORDER — ORAL CARE MOUTH RINSE: 15.0000 mL | Freq: Once | OROMUCOSAL | Status: AC

## 2022-05-19 MED ORDER — BSS IO SOLN
INTRAOCULAR | Status: AC
  Filled 2022-05-19: qty 15

## 2022-05-19 MED ORDER — FENTANYL CITRATE (PF) 250 MCG/5ML IJ SOLN
INTRAMUSCULAR | Status: DC | PRN
  Administered 2022-05-19: 50 ug via INTRAVENOUS

## 2022-05-19 MED ORDER — STERILE WATER FOR INJECTION IJ SOLN
INTRAMUSCULAR | Status: DC | PRN
  Administered 2022-05-19: .5 mL

## 2022-05-19 MED ORDER — ONDANSETRON HCL 4 MG/2ML IJ SOLN
INTRAMUSCULAR | Status: DC | PRN
  Administered 2022-05-19: 4 mg via INTRAVENOUS

## 2022-05-19 MED ORDER — INSULIN ASPART 100 UNIT/ML IJ SOLN
0.0000 [IU] | INTRAMUSCULAR | Status: DC | PRN
  Administered 2022-05-19: 2 [IU] via SUBCUTANEOUS
  Filled 2022-05-19: qty 1

## 2022-05-19 MED ORDER — EPHEDRINE 5 MG/ML INJ
INTRAVENOUS | Status: AC
  Filled 2022-05-19: qty 5

## 2022-05-19 MED ORDER — INDOCYANINE GREEN 25 MG IV SOLR
INTRAVENOUS | Status: AC
  Filled 2022-05-19: qty 10

## 2022-05-19 MED ORDER — TOBRAMYCIN-DEXAMETHASONE 0.3-0.1 % OP OINT
TOPICAL_OINTMENT | OPHTHALMIC | Status: AC
  Filled 2022-05-19: qty 3.5

## 2022-05-19 MED ORDER — TROPICAMIDE 1 % OP SOLN
1.0000 [drp] | OPHTHALMIC | Status: AC | PRN
  Administered 2022-05-19 (×3): 1 [drp] via OPHTHALMIC
  Filled 2022-05-19: qty 15

## 2022-05-19 MED ORDER — SODIUM CHLORIDE 0.9 % IV SOLN: INTRAVENOUS | Status: DC

## 2022-05-19 MED ORDER — ONDANSETRON HCL 4 MG/2ML IJ SOLN: 4.0000 mg | Freq: Four times a day (QID) | INTRAMUSCULAR | Status: DC | PRN

## 2022-05-19 MED ORDER — PHENYLEPHRINE 80 MCG/ML (10ML) SYRINGE FOR IV PUSH (FOR BLOOD PRESSURE SUPPORT)
PREFILLED_SYRINGE | INTRAVENOUS | Status: AC
  Filled 2022-05-19: qty 10

## 2022-05-19 MED ORDER — BUPIVACAINE HCL (PF) 0.75 % IJ SOLN
INTRAMUSCULAR | Status: AC
  Filled 2022-05-19: qty 10

## 2022-05-19 MED ORDER — DEXAMETHASONE SODIUM PHOSPHATE 10 MG/ML IJ SOLN
INTRAMUSCULAR | Status: AC
  Filled 2022-05-19: qty 1

## 2022-05-19 MED ORDER — MIDAZOLAM HCL 2 MG/2ML IJ SOLN
INTRAMUSCULAR | Status: DC | PRN
  Administered 2022-05-19: 2 mg via INTRAVENOUS

## 2022-05-19 MED ORDER — EPINEPHRINE PF 1 MG/ML IJ SOLN
INTRAOCULAR | Status: DC | PRN
  Administered 2022-05-19: 500.3 mL

## 2022-05-19 MED ORDER — GATIFLOXACIN 0.5 % OP SOLN
OPHTHALMIC | Status: AC
  Filled 2022-05-19: qty 2.5

## 2022-05-19 MED ORDER — PROPARACAINE HCL 0.5 % OP SOLN
1.0000 [drp] | OPHTHALMIC | Status: AC | PRN
  Administered 2022-05-19 (×3): 1 [drp] via OPHTHALMIC
  Filled 2022-05-19: qty 15

## 2022-05-19 MED ORDER — LIDOCAINE HCL (PF) 4 % IJ SOLN
INTRAMUSCULAR | Status: AC
  Filled 2022-05-19: qty 5

## 2022-05-19 MED ORDER — PHENYLEPHRINE HCL-NACL 20-0.9 MG/250ML-% IV SOLN
INTRAVENOUS | Status: DC | PRN
  Administered 2022-05-19: 10 ug/min via INTRAVENOUS

## 2022-05-19 MED ORDER — ATROPINE SULFATE 1 % OP SOLN
1.0000 [drp] | OPHTHALMIC | Status: AC | PRN
  Administered 2022-05-19 (×3): 1 [drp] via OPHTHALMIC
  Filled 2022-05-19: qty 2

## 2022-05-19 MED ORDER — CHLORHEXIDINE GLUCONATE 0.12 % MT SOLN
15.0000 mL | Freq: Once | OROMUCOSAL | Status: AC
  Administered 2022-05-19: 15 mL via OROMUCOSAL
  Filled 2022-05-19: qty 15

## 2022-05-19 MED ORDER — CEFTAZIDIME 1 G IJ SOLR
INTRAMUSCULAR | Status: AC
  Filled 2022-05-19: qty 1

## 2022-05-19 MED ORDER — STERILE WATER FOR INJECTION IJ SOLN
INTRAMUSCULAR | Status: AC
  Filled 2022-05-19: qty 10

## 2022-05-19 MED ORDER — MIDAZOLAM HCL 2 MG/2ML IJ SOLN
INTRAMUSCULAR | Status: AC
  Filled 2022-05-19: qty 2

## 2022-05-19 MED ORDER — ATROPINE SULFATE 1 % OP SOLN
OPHTHALMIC | Status: AC
  Filled 2022-05-19: qty 5

## 2022-05-19 MED ORDER — FENTANYL CITRATE (PF) 250 MCG/5ML IJ SOLN
INTRAMUSCULAR | Status: AC
  Filled 2022-05-19: qty 5

## 2022-05-19 MED ORDER — SUCCINYLCHOLINE CHLORIDE 200 MG/10ML IV SOSY
PREFILLED_SYRINGE | INTRAVENOUS | Status: DC | PRN
  Administered 2022-05-19: 120 mg via INTRAVENOUS

## 2022-05-19 MED ORDER — BACITRACIN-POLYMYXIN B 500-10000 UNIT/GM OP OINT
TOPICAL_OINTMENT | OPHTHALMIC | Status: DC | PRN
  Administered 2022-05-19: 1 via OPHTHALMIC

## 2022-05-19 MED ORDER — OXYCODONE HCL 5 MG/5ML PO SOLN: 5.0000 mg | Freq: Once | ORAL | Status: DC | PRN

## 2022-05-19 MED ORDER — ACETAMINOPHEN 500 MG PO TABS
ORAL_TABLET | ORAL | Status: AC
  Filled 2022-05-19: qty 2

## 2022-05-19 MED ORDER — BEVACIZUMAB CHEMO INJECTION 1.25MG/0.05ML SYRINGE FOR KALEIDOSCOPE
INTRAVITREAL | Status: DC | PRN
  Administered 2022-05-19: 1.25 mg via INTRAVITREAL

## 2022-05-19 MED ORDER — POLYMYXIN B SULFATE 500000 UNITS IJ SOLR
INTRAMUSCULAR | Status: AC
  Filled 2022-05-19: qty 10

## 2022-05-19 MED ORDER — LIDOCAINE HCL (PF) 2 % IJ SOLN
INTRAMUSCULAR | Status: AC
  Filled 2022-05-19: qty 10

## 2022-05-19 MED ORDER — CARBACHOL 0.01 % IO SOLN
INTRAOCULAR | Status: AC
  Filled 2022-05-19: qty 1.5

## 2022-05-19 MED ORDER — TRIAMCINOLONE ACETONIDE 40 MG/ML IJ SUSP
INTRAMUSCULAR | Status: AC
  Filled 2022-05-19: qty 5

## 2022-05-19 MED ORDER — DEXTROSE 5 % IV SOLN
INTRAVENOUS | Status: AC
  Filled 2022-05-19: qty 100

## 2022-05-19 MED ORDER — ATROPINE SULFATE 1 % OP SOLN
OPHTHALMIC | Status: DC | PRN
  Administered 2022-05-19: 1 [drp] via OPHTHALMIC

## 2022-05-19 MED ORDER — SODIUM CHLORIDE (PF) 0.9 % IJ SOLN
INTRAMUSCULAR | Status: AC
  Filled 2022-05-19: qty 10

## 2022-05-19 MED ORDER — ACETAZOLAMIDE SODIUM 500 MG IJ SOLR
INTRAMUSCULAR | Status: AC
  Filled 2022-05-19: qty 500

## 2022-05-19 MED ORDER — DEXMEDETOMIDINE HCL IN NACL 80 MCG/20ML IV SOLN
INTRAVENOUS | Status: DC | PRN
  Administered 2022-05-19 (×2): 8 ug via BUCCAL
  Administered 2022-05-19: 4 ug via BUCCAL

## 2022-05-19 MED ORDER — BRIMONIDINE TARTRATE 0.2 % OP SOLN
OPHTHALMIC | Status: AC
  Filled 2022-05-19: qty 5

## 2022-05-19 MED ORDER — FENTANYL CITRATE (PF) 100 MCG/2ML IJ SOLN: 25.0000 ug | INTRAMUSCULAR | Status: DC | PRN

## 2022-05-19 MED ORDER — BSS PLUS IO SOLN
INTRAOCULAR | Status: AC
  Filled 2022-05-19: qty 500

## 2022-05-19 MED ORDER — EPINEPHRINE PF 1 MG/ML IJ SOLN
INTRAMUSCULAR | Status: AC
  Filled 2022-05-19: qty 1

## 2022-05-19 MED ORDER — EPHEDRINE SULFATE-NACL 50-0.9 MG/10ML-% IV SOSY
PREFILLED_SYRINGE | INTRAVENOUS | Status: DC | PRN
  Administered 2022-05-19 (×2): 5 mg via INTRAVENOUS

## 2022-05-19 MED ORDER — DEXMEDETOMIDINE HCL IN NACL 80 MCG/20ML IV SOLN
INTRAVENOUS | Status: AC
  Filled 2022-05-19: qty 20

## 2022-05-19 MED ORDER — DORZOLAMIDE HCL-TIMOLOL MAL 2-0.5 % OP SOLN
OPHTHALMIC | Status: AC
  Filled 2022-05-19: qty 10

## 2022-05-19 MED ORDER — LIDOCAINE 2% (20 MG/ML) 5 ML SYRINGE
INTRAMUSCULAR | Status: AC
  Filled 2022-05-19: qty 5

## 2022-05-19 MED ORDER — LIDOCAINE 2% (20 MG/ML) 5 ML SYRINGE
INTRAMUSCULAR | Status: DC | PRN
  Administered 2022-05-19: 50 mg via INTRAVENOUS

## 2022-05-19 MED ORDER — PREDNISOLONE ACETATE 1 % OP SUSP
OPHTHALMIC | Status: DC | PRN
  Administered 2022-05-19: 1 [drp] via OPHTHALMIC

## 2022-05-19 MED ORDER — TRIAMCINOLONE ACETONIDE 40 MG/ML IJ SUSP
INTRAMUSCULAR | Status: DC | PRN
  Administered 2022-05-19 (×2): .5 mL

## 2022-05-19 MED ORDER — PHENYLEPHRINE 80 MCG/ML (10ML) SYRINGE FOR IV PUSH (FOR BLOOD PRESSURE SUPPORT)
PREFILLED_SYRINGE | INTRAVENOUS | Status: DC | PRN
  Administered 2022-05-19 (×3): 160 ug via INTRAVENOUS
  Administered 2022-05-19: 80 ug via INTRAVENOUS
  Administered 2022-05-19: 160 ug via INTRAVENOUS

## 2022-05-19 MED ORDER — ACETAMINOPHEN 500 MG PO TABS
1000.0000 mg | ORAL_TABLET | Freq: Once | ORAL | Status: AC
  Administered 2022-05-19: 1000 mg via ORAL

## 2022-05-19 MED ORDER — ONDANSETRON HCL 4 MG/2ML IJ SOLN
INTRAMUSCULAR | Status: AC
  Filled 2022-05-19: qty 2

## 2022-05-19 MED ORDER — PREDNISOLONE ACETATE 1 % OP SUSP
OPHTHALMIC | Status: AC
  Filled 2022-05-19: qty 5

## 2022-05-19 MED ORDER — NA CHONDROIT SULF-NA HYALURON 40-30 MG/ML IO SOSY
INTRAOCULAR | Status: AC
  Filled 2022-05-19: qty 1

## 2022-05-19 MED ORDER — BRIMONIDINE TARTRATE 0.2 % OP SOLN
OPHTHALMIC | Status: DC | PRN
  Administered 2022-05-19: 1 [drp] via OPHTHALMIC

## 2022-05-19 SURGICAL SUPPLY — 52 items
APL SWBSTK 6 STRL LF DISP (MISCELLANEOUS)
APPLICATOR COTTON TIP 6 STRL (MISCELLANEOUS) ×4 IMPLANT
APPLICATOR COTTON TIP 6IN STRL (MISCELLANEOUS)
BAND WRIST GAS GREEN (MISCELLANEOUS) IMPLANT
BNDG EYE OVAL (GAUZE/BANDAGES/DRESSINGS) ×1 IMPLANT
CABLE BIPOLOR RESECTION CORD (MISCELLANEOUS) ×1 IMPLANT
CANNULA ANT CHAM MAIN (OPHTHALMIC RELATED) IMPLANT
CANNULA FLEX TIP 25G (CANNULA) ×1 IMPLANT
CLSR STERI-STRIP ANTIMIC 1/2X4 (GAUZE/BANDAGES/DRESSINGS) ×1 IMPLANT
DRAPE INCISE 51X51 W/FILM STRL (DRAPES) ×1 IMPLANT
DRAPE MICROSCOPE LEICA 46X105 (MISCELLANEOUS) ×1 IMPLANT
DRAPE OPHTHALMIC 77X100 STRL (CUSTOM PROCEDURE TRAY) ×1 IMPLANT
FORCEPS GRIESHABER ILM 25G A (INSTRUMENTS) IMPLANT
FORCEPS GRIESHABER MAX 25G (MISCELLANEOUS) IMPLANT
GAS AUTO FILL CONSTEL (OPHTHALMIC)
GAS AUTO FILL CONSTELLATION (OPHTHALMIC) IMPLANT
GAS WRIST BAND GREEN (MISCELLANEOUS)
GLOVE BIO SURGEON STRL SZ7.5 (GLOVE) ×2 IMPLANT
GLOVE BIOGEL M 7.0 STRL (GLOVE) ×1 IMPLANT
GOWN STRL REUS W/ TWL LRG LVL3 (GOWN DISPOSABLE) ×2 IMPLANT
GOWN STRL REUS W/ TWL XL LVL3 (GOWN DISPOSABLE) ×1 IMPLANT
GOWN STRL REUS W/TWL LRG LVL3 (GOWN DISPOSABLE) ×2
GOWN STRL REUS W/TWL XL LVL3 (GOWN DISPOSABLE) ×1
KIT BASIN OR (CUSTOM PROCEDURE TRAY) ×1 IMPLANT
LENS VITRECTOMY FLAT OCLR DISP (MISCELLANEOUS) IMPLANT
LOOP FINESSE 25 GA (MISCELLANEOUS) IMPLANT
NDL 18GX1X1/2 (RX/OR ONLY) (NEEDLE) ×3 IMPLANT
NDL 25GX 5/8IN NON SAFETY (NEEDLE) ×3 IMPLANT
NDL FILTER BLUNT 18X1 1/2 (NEEDLE) ×1 IMPLANT
NDL HYPO 30X.5 LL (NEEDLE) ×2 IMPLANT
NEEDLE 18GX1X1/2 (RX/OR ONLY) (NEEDLE) ×2 IMPLANT
NEEDLE 25GX 5/8IN NON SAFETY (NEEDLE) ×2 IMPLANT
NEEDLE FILTER BLUNT 18X1 1/2 (NEEDLE) ×1 IMPLANT
NEEDLE HYPO 30X.5 LL (NEEDLE) IMPLANT
PACK VITRECTOMY CUSTOM (CUSTOM PROCEDURE TRAY) ×1 IMPLANT
PAD ARMBOARD 7.5X6 YLW CONV (MISCELLANEOUS) ×2 IMPLANT
PAK PIK VITRECTOMY CVS 25GA (OPHTHALMIC) ×1 IMPLANT
PENCIL BIPOLAR 25GA STR DISP (OPHTHALMIC RELATED) IMPLANT
PROBE ENDO DIATHERMY 25G (MISCELLANEOUS) IMPLANT
PROBE LASER ILLUM FLEX CVD 25G (OPHTHALMIC) IMPLANT
SCISSORS TIP ADVANCED DSP 25GA (INSTRUMENTS) IMPLANT
SET INJECTOR OIL FLUID CONSTEL (OPHTHALMIC) IMPLANT
SHIELD EYE LENSE ONLY DISP (GAUZE/BANDAGES/DRESSINGS) IMPLANT
SUT VICRYL 7 0 TG140 8 (SUTURE) ×1 IMPLANT
SYR 10ML LL (SYRINGE) ×1 IMPLANT
SYR 20ML LL LF (SYRINGE) ×1 IMPLANT
SYR 5ML LL (SYRINGE) ×1 IMPLANT
SYR TB 1ML LUER SLIP (SYRINGE) ×2 IMPLANT
TAPE SURG TRANSPORE 1 IN (GAUZE/BANDAGES/DRESSINGS) IMPLANT
TAPE SURGICAL TRANSPORE 1 IN (GAUZE/BANDAGES/DRESSINGS) ×1
TOWEL GREEN STERILE FF (TOWEL DISPOSABLE) ×1 IMPLANT
WATER STERILE IRR 1000ML POUR (IV SOLUTION) ×1 IMPLANT

## 2022-05-19 NOTE — Anesthesia Procedure Notes (Signed)
Procedure Name: Intubation Date/Time: 05/19/2022 11:44 AM  Performed by: Ester Rink, CRNAPre-anesthesia Checklist: Patient identified, Emergency Drugs available, Suction available and Patient being monitored Patient Re-evaluated:Patient Re-evaluated prior to induction Oxygen Delivery Method: Circle system utilized Preoxygenation: Pre-oxygenation with 100% oxygen Induction Type: IV induction Ventilation: Mask ventilation without difficulty Laryngoscope Size: Mac and 4 Grade View: Grade I Tube type: Oral Tube size: 7.5 mm Number of attempts: 1 Airway Equipment and Method: Stylet and Oral airway Placement Confirmation: ETT inserted through vocal cords under direct vision, positive ETCO2 and breath sounds checked- equal and bilateral Secured at: 22 cm Tube secured with: Tape Dental Injury: Teeth and Oropharynx as per pre-operative assessment

## 2022-05-19 NOTE — Transfer of Care (Signed)
Immediate Anesthesia Transfer of Care Note  Patient: Bill [name]  Procedure(s) Performed: TWENTY FIVE GAUGE PARS PLANA VITRECTOMY (Right: Eye) MEMBRANE PEEL (Right: Eye) PHOTOCOAGULATION WITH LASER (Right: Eye)  Patient Location: PACU  Anesthesia Type:General  Level of Consciousness: awake, alert , and drowsy  Airway & Oxygen Therapy: Patient connected to face mask oxygen  Post-op Assessment: Report given to RN and Post -op Vital signs reviewed and stable  Post vital signs: Reviewed and stable  Last Vitals:  Vitals Value Taken Time  BP 151/72 05/19/22 1353  Temp    Pulse 99 05/19/22 1356  Resp 18 05/19/22 1356  SpO2 97 % 05/19/22 1356  Vitals shown include unvalidated device data.  Last Pain:  Vitals:   05/19/22 0959  TempSrc:   PainSc: 0-No pain         Complications: There were no known notable events for this encounter.

## 2022-05-19 NOTE — Discharge Instructions (Addendum)
POSTOPERATIVE INSTRUCTIONS  Your doctor has performed vitreoretinal surgery on you at [place].  - Keep eye patched and shielded until seen by Dr. Zamora 8 AM tomorrow in clinic - Do not use drops until return - Sleep with head elevated 30-45 degrees   - No strenuous bending, stooping or lifting.  - You may not drive until further notice.  - Tylenol or any other over-the-counter pain reliever can be used according to your doctor. If more pain medicine is required, your doctor will have a prescription for you.  - You may read, go up and down stairs, and watch television.     Brian Zamora, M.D., Ph.D.  

## 2022-05-19 NOTE — Progress Notes (Signed)
Pt is alert and oriented. Pt is sitting up in chair awaiting ride. Pt mother is sitting at bedside with him. No complaints of pain or discomfort.

## 2022-05-19 NOTE — Op Note (Signed)
Date of procedure:  12.07.2023   Surgeon: [name], MD, PhD  Assistant: Ernest Mallick, Ophthalmic Assistant   Pre-operative Diagnoses:  Proliferative diabetic retinopathy, right eye Vitreous hemorrhage, right eye   Post-operative diagnosis:  Proliferative diabetic retinopathy, right eye Vitreous hemorrhage, right eye   Anesthesia: General   Procedure:   1. 25 gauge pars plana vitrectomy, Right Eye 2. Diabetic vitreous membrane peel, Right Eye  3. Endolaser, Right Eye 4. Intravitreal injection of Avastin, Right Eye    Indications for procedure: The patient presented with history of decreased vision due to diabetic vitreous hemorrhage, right eye. After discussing the risks, benefits, and alternatives to surgery, the patient elected to undergo surgical repair and informed consent was obtained.  The surgery was an attempt to clear the vitreous hemorrhage and treat and/or repair any issues or defects contributing to the vitreous hemorrhage, within the reasonable expectations of the surgeon.    Procedure in Detail:    The patient was met in the pre-operative holding area where their identification data was verified.  It was noted that there was a signed, informed consent in the chart and the Right Eye eye was verbally verified by the patient as the operative eye and was marked with a marking pen. The patient was then taken to the operating room and placed in the supine position. General endotracheal anesthesia was induced.  The eye was then prepped with 5% betadine and draped in the normal fashion for ophthalmic surgery. The microscope was draped and swung into position, and a secondary time-out was performed to identify the correct patient, eye, procedures, and any allergies.   A 25 gauge trocar was inserted in a 30-45 degrees fashion into the inferotemporal quadrant 4 mm posterior to the limbus in this phakic patient.  Correct positioning within the vitreous was verified externally with  the light pipe.  The infusion was then connected to the cannula and BSS infusion was commenced.  Additional ports were placed in the superonasal and superotemporal quadrants. Viscoat was placed on the cornea and the BIOM was used to visualize the posterior segment while the core vitrectomy was completed. The patient had an acute on chronic vitreous hemorrhage with scattered preretinal fibrosis concentrated in the posterior pole. A posterior vitreous detachment was induced using ILM forceps to grab a fibrotic plug over the optic nerve head and lifting anteriorly. The remaining vitreous and associated hemorrhage was meticulously removed. Kenalog was used to aid in this process.      Scleral depression was performed and used to shave the thick and adherent vitreous base and associated vitreous hemorrhage. Posterior to the border of the patient's PRP laser scars, there were patches of neovascularization with vitreous traction. Visualizing with a macular contact lens, these neovascular tufts were trimmed down and the tractional fibrosis was peeled off. All vitreous traction was removed. Next, the endolaser was used to supplement and fill-in the PRP laser posteriorly almost to the arcades 360 and anteriorly to the ora. Following these maneuvers, the retina was reattach with good PRP laser fill in 360.   The superonasal and superotemporal ports were removed and sutured with 7-0 vicryl, there was no leakage. The inferotemporal infusion port was removed. There was no leakage from the sclerotomy sites.  An intravitreal injection of Avastin (bevacizumab, 1.25 mg) was administered in the superotemporal quadrant, 54m posterior to the limbus without complication. The IOP was checked by digital palpation and found to be within physiologic range.   Subconjunctival injections of kefzol + bacitracin +  polymixin b and kenalog were then administered, and antibiotic and steroid drops as well as antibiotic ointment were placed  in the eye.  The drapes were removed and the eye was patched and shielded. The patient was then taken to the post-operative area for recovery having tolerated the procedure well.  He was instructed to keep his elevated to 30-45 degrees postoperatively and to follow up in clinic the following morning as scheduled.   Estimate blood lost: none Complications: None

## 2022-05-19 NOTE — Brief Op Note (Signed)
05/19/2022  1:43 PM  PATIENT:  Lorenso Courier  41 y.o. male  PRE-OPERATIVE DIAGNOSIS:  vitreous hemorrhage, right eye  POST-OPERATIVE DIAGNOSIS:  vitreous hemorrhage, right eye  PROCEDURE:  Procedure(s): TWENTY FIVE GAUGE PARS PLANA VITRECTOMY (Right) MEMBRANE PEEL (Right) PHOTOCOAGULATION WITH LASER (Right)  SURGEON:  Surgeon(s) and Role:    [name], MD - Primary  ASSISTANTS: Ernest Mallick, Ophthalmic Assistant    ANESTHESIA:   general  EBL:  10 mL   BLOOD ADMINISTERED:none  DRAINS: none   LOCAL MEDICATIONS USED:  NONE  SPECIMEN:  No Specimen  DISPOSITION OF SPECIMEN:  N/A  COUNTS:  YES  TOURNIQUET:  * No tourniquets in log *  DICTATION: .Note written in EPIC  PLAN OF CARE: Discharge to home after PACU  PATIENT DISPOSITION:  PACU - hemodynamically stable.   Delay start of Pharmacological VTE agent (>24hrs) due to surgical blood loss or risk of bleeding: not applicable

## 2022-05-19 NOTE — Interval H&P Note (Signed)
History and Physical Interval Note:  05/19/2022 11:19 AM  Bill [name]  has presented today for surgery, with the diagnosis of vitreous hemorrhage, right eye.  The various methods of treatment have been discussed with the patient and family. After consideration of risks, benefits and other options for treatment, the patient has consented to  Procedure(s): PARS PLANA VITRECTOMY WITH 25 GAUGE (Right) MEMBRANE PEEL (Right) as a surgical intervention.  The patient's history has been reviewed, patient examined, no change in status, stable for surgery.  I have reviewed the patient's chart and labs.  Questions were answered to the patient's satisfaction.     [name]

## 2022-05-20 ENCOUNTER — Encounter (INDEPENDENT_AMBULATORY_CARE_PROVIDER_SITE_OTHER): Payer: Self-pay | Admitting: Ophthalmology

## 2022-05-20 ENCOUNTER — Telehealth: Payer: Self-pay | Admitting: Internal Medicine

## 2022-05-20 ENCOUNTER — Ambulatory Visit (INDEPENDENT_AMBULATORY_CARE_PROVIDER_SITE_OTHER): Payer: Medicare Other | Admitting: Ophthalmology

## 2022-05-20 DIAGNOSIS — I10 Essential (primary) hypertension: Secondary | ICD-10-CM

## 2022-05-20 DIAGNOSIS — Z961 Presence of intraocular lens: Secondary | ICD-10-CM

## 2022-05-20 DIAGNOSIS — H4313 Vitreous hemorrhage, bilateral: Secondary | ICD-10-CM

## 2022-05-20 DIAGNOSIS — F411 Generalized anxiety disorder: Secondary | ICD-10-CM

## 2022-05-20 DIAGNOSIS — E113513 Type 2 diabetes mellitus with proliferative diabetic retinopathy with macular edema, bilateral: Secondary | ICD-10-CM

## 2022-05-20 DIAGNOSIS — H40053 Ocular hypertension, bilateral: Secondary | ICD-10-CM

## 2022-05-20 DIAGNOSIS — H25811 Combined forms of age-related cataract, right eye: Secondary | ICD-10-CM

## 2022-05-20 DIAGNOSIS — H35033 Hypertensive retinopathy, bilateral: Secondary | ICD-10-CM

## 2022-05-20 DIAGNOSIS — I639 Cerebral infarction, unspecified: Secondary | ICD-10-CM

## 2022-05-20 MED ORDER — SERTRALINE HCL 50 MG PO TABS: 50.0000 mg | ORAL_TABLET | Freq: Every day | ORAL | 0 refills | Status: DC

## 2022-05-20 NOTE — Telephone Encounter (Signed)
Rx done. 

## 2022-05-20 NOTE — Telephone Encounter (Signed)
Pt stated he needs a Rx refill on sertraline (ZOLOFT) 50 MG tablet pt is currently out and would need a refill as soon as possible.   Pt is aware that Dr. Is out of the office today and won't return until Mon. Pt is also aware that it can take up to 3 business days for Rx to be filled.   Please advise.

## 2022-05-20 NOTE — Progress Notes (Signed)
[facility] Note  05/20/2022     CHIEF COMPLAINT Patient presents for Retina Follow Up   HISTORY OF PRESENT ILLNESS: Bill [name] is a 41 y.o. male who presents to the clinic today for:  HPI     Retina Follow Up   Patient presents with  Other.  In right eye.  Severity is moderate.  Duration of 1 day.  Since onset it is stable.  I, the attending physician,  performed the HPI with the patient and updated documentation appropriately.        Comments   Pt here for 1 day POV s/p PPV OD 05/20/22. Pt states he is doing OK after surgery, got a bit of sleep last night. No pain reported.       Last edited by [name], MD on 05/23/2022  2:11 AM.    Pt states last night was rough, his eye is irritated, he states the numbing drops this morning helped with the pain  Referring physician: Isaac Bliss, Rayford Halsted, MD [address],  [place] [address]  HISTORICAL INFORMATION:   Selected notes from the MEDICAL RECORD NUMBER Referred by Dr. [name] for concern of severe NPDR / PDR OU   CURRENT MEDICATIONS: No current outpatient medications on file. (Ophthalmic Drugs)   No current facility-administered medications for this visit. (Ophthalmic Drugs)   Current Outpatient Medications (Other)  Medication Sig   atorvastatin (LIPITOR) 10 MG tablet TAKE 1 TABLET BY MOUTH EVERY DAY   cholecalciferol (VITAMIN D-400) 10 MCG (400 UNIT) TABS tablet Take 400 Units by mouth daily.   Dulaglutide (TRULICITY) 6.21 HY/8.6VH SOPN Inject 0.75 mg into the skin once a week. (Patient taking differently: Inject 0.75 mg into the skin every Tuesday.)   fluticasone (FLONASE) 50 MCG/ACT nasal spray Place 1 spray into both nostrils daily as needed for allergies or rhinitis.   Insulin Glargine Solostar (LANTUS) 100 UNIT/ML Solostar Pen Inject 6-12 Units into the skin every evening.   mycophenolate (MYFORTIC) 180 MG EC tablet Take 720 mg by mouth 2 (two) times  daily.   predniSONE (DELTASONE) 5 MG tablet Take 5 mg by mouth daily with breakfast.   sulfamethoxazole-trimethoprim (BACTRIM) 400-80 MG tablet Take 1 tablet by mouth every Monday, Wednesday, and Friday.   tacrolimus (PROGRAF) 1 MG capsule Take 3-4 mg by mouth See admin instructions. 4 mg in the morning, 3 mg in the evening   sertraline (ZOLOFT) 50 MG tablet Take 1 tablet (50 mg total) by mouth daily.   No current facility-administered medications for this visit. (Other)   REVIEW OF SYSTEMS: ROS   Positive for: Neurological, Genitourinary, Endocrine, Cardiovascular, Eyes, Psychiatric Negative for: Constitutional, Gastrointestinal, Skin, Musculoskeletal, HENT, Respiratory, Allergic/Imm, Heme/Lymph Last edited by Kingsley Spittle, COT on 05/20/2022  8:26 AM.     ALLERGIES Allergies  Allergen Reactions   Food     Melons-itchy/headaches   PAST MEDICAL HISTORY Past Medical History:  Diagnosis Date   Anemia    Anxiety    situational   Diabetes mellitus without complication ([place])    type 2   Diabetic retinopathy ([place])    PDR OU   ESRD on dialysis ([place])    living unrelated donor renal transplant 11/05/20   History of blood transfusion    Hypertension    PONV (postoperative nausea and vomiting)    03/22/2019- only 15 years ago   Renal disorder    Stroke ([place]) 2019   vision  Past Surgical History:  Procedure Laterality Date   A/V FISTULAGRAM N/A 03/11/2019   Procedure: A/V FISTULAGRAM - Left Arm;  Surgeon: Waynetta Sandy, MD;  Location: [facility];  Service: Cardiovascular;  Laterality: N/A;   arm surgery Right    Fracture   AV FISTULA PLACEMENT Left 01/25/2019   Procedure: ARTERIOVENOUS (AV) FISTULA CREATION  LEFT UPPER  ARM;  Surgeon: Serafina Mitchell, MD;  Location: [facility];  Service: Vascular;  Laterality: Left;   [place] Left 03/27/2019   Procedure: SECOND STAGE BASILIC VEIN TRANSPOSITION LEFT ARM;  Surgeon: Serafina Mitchell, MD;   Location: [facility];  Service: Vascular;  Laterality: Left;   KIDNEY TRANSPLANT  11/05/2020   [place]   MEMBRANE PEEL Right 05/19/2022   Procedure: MEMBRANE PEEL;  Surgeon: [name], MD;  Location: [facility];  Service: Ophthalmology;  Laterality: Right;   PARS PLANA VITRECTOMY Right 05/19/2022   Procedure: TWENTY FIVE GAUGE PARS PLANA VITRECTOMY;  Surgeon: [name], MD;  Location: [facility];  Service: Ophthalmology;  Laterality: Right;   PHOTOCOAGULATION WITH LASER Right 05/19/2022   Procedure: PHOTOCOAGULATION WITH LASER;  Surgeon: [name], MD;  Location: [facility];  Service: Ophthalmology;  Laterality: Right;   WISDOM TOOTH EXTRACTION     FAMILY HISTORY Family History  Problem Relation Age of Onset   Diabetes Mellitus II Mother    Hypertension Mother    Cataracts Mother    Diabetes Mellitus II Father    Stroke Father    Hypertension Father    Glaucoma Maternal Grandmother    Amblyopia Neg Hx    Blindness Neg Hx    Macular degeneration Neg Hx    Retinal detachment Neg Hx    Strabismus Neg Hx    Retinitis pigmentosa Neg Hx    SOCIAL HISTORY Social History   Tobacco Use   Smoking status: Never   Smokeless tobacco: Never  Vaping Use   Vaping Use: Never used  Substance Use Topics   Alcohol use: Not Currently   Drug use: Not Currently       OPHTHALMIC EXAM:  Base Eye Exam     Visual Acuity (Snellen - Linear)       Right Left   Dist [place] 20/400 unable  Pt was unable to do acuity OS. MS        Tonometry (Tonopen, 8:31 AM)       Right Left   Pressure 10 unable  Pt squeezing. MS        Pupils       Dark Light Shape React APD   Right 6 6 Round NR 0   Left 4 3 Round Brisk None         Visual Fields (Counting fingers)       Left Right   Restrictions Total superior temporal, superior nasal deficiencies; Partial outer inferior temporal, inferior nasal deficiencies Partial outer inferior temporal deficiency         Extraocular Movement        Right Left    Full, Ortho Full, Ortho         Neuro/Psych     Oriented x3: Yes   Mood/Affect: Normal         Dilation     Both eyes: 1.0% Mydriacyl, 2.5% Phenylephrine @ 8:34 AM           Slit Lamp and Fundus Exam     Slit Lamp Exam  Right Left   Lids/Lashes Dermatochalasis - upper lid Normal   Conjunctiva/Sclera mild melanosis, Subconjunctival hemorrhage -- temporally White and quiet   Cornea Trace Punctate epithelial erosions, epi defect (3.25Vx5.0H), 2+ Descemet's folds Trace Punctate epithelial erosions, well healed cataract wound   Anterior Chamber deep and clear deep, 2-3+fine cell and pigment   Iris Round and dilated, No NVI Round and dilated, no NVI   Lens 1-2+ Nuclear sclerosis, 1-2+ Cortical cataract, 2+Posterior subcapsular cataract PC IOL in good position   Anterior Vitreous post vitrectomy, VH cleared Syneresis, , blood stained vitreous condensations         Fundus Exam       Right Left   Disc 2+ mild Pallor, Sharp rim, fibrosis gone No view   C/D Ratio 0.1 0.2   Macula Flat, Good foveal reflex, scattered MA/DBH, fibrosis improved No view   Vessels Severe attenuation, Tortuous, +fibrosis, +NV inferior to disc -- regressing Sclerotic arterioles, Tortuous, +NV -- regressing, fibrosis along superior arcades, severe Vascular attenuation   Periphery Attached, scattered MA, good fill in PRP 360 Hazy view, superior retina visible with PRP           IMAGING AND PROCEDURES  Imaging and Procedures for @TODAY @          ASSESSMENT/PLAN:   ICD-10-CM   1. Proliferative diabetic retinopathy of both eyes with macular edema associated with type 2 diabetes mellitus ([place])  S85.4627     2. Vitreous hemorrhage of both eyes ([place])  H43.13     3. Essential hypertension  I10     4. Hypertensive retinopathy of both eyes  H35.033     5. Acute CVA (cerebrovascular accident) ([place])  I63.9     6. Ocular hypertension, bilateral  H40.053     7. Combined  forms of age-related cataract of right eye  H25.811     8. Pseudophakia  Z96.1      1,2. Proliferative diabetic retinopathy OU  - last A1c was 11.9 on 05.11.23; 9.6 on 11.29.22; 7.8 on 7.20.22; 7.2 on 01.13.22  - s/p PRP OD (10.02.19), (01.22.20), (02.12.20), (03.08.22)  - s/p PRP OS (05/02/2018), (02.19.20), (2.17.22)  - s/p IVK #1 OD (01.22.20), OS (02.12.20)  - s/p IVA OD #1 (10.21.20), #2 (12.02.20), #3 (01.13.21), #4 (02.10.21), #5 (04.19.21), #6 (05.21.21), #7 (06.21.21), #8 (08.19.21), #9 (10.11.21), #10 (12.01.21), #11 (01.25.22), #12 (03.01.22), #13 (04.05.22), #14 (6.6.22), #15 (09.22.22), #16 (03.28.23), #17 (08.04.23), #18 (11.01.23 for [facility]), #19 (11.29.23)  - s/p IVA OS #1 (12.02.20), #2 (04.19.21), #3 (05.21.21), #4 (06.21.21), #5 (08.19.21), #6 (10.11.21), #7 (12.01.21), #8 (01.25.22), #9 (03.01.22), #10 (04.05.22), #11 (6.6.22), #12 (09.22.22), #13 (12.04.23)   - FA 8.19.19 shows leaking MA, patches of capillary nonperfusion OU  - repeat FA 10.11.21 shows persistent NV OU, large patches of vascular nonperfusion OU (OS>OD) -- fill in PRP completed OU (OS  2.17.22, OD 3.8.22)  - repeat FA 9.13.22 shows interval improvement in NV/leakage  - pre-VH BCVA OD: 20/40  - s/p POD1 s/p PPV/MP OD, 12.07.2023             - doing well this morning             - IOP good at 10             - start   PF 4x/day OD                          zymaxid QID OD  Atropine BID OD                          PSO ung QID OD              - sleep with head elevated             - eye shield when sleeping              - post op drop and positioning instructions reviewed              - tylenol/ibuprofen for pain              - Rx given for breakthrough pain - f/u next Thursday, POV  3,4. 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 [name] in mid August 2019 with a 2 wk history intractable headaches and decreased vision OU  - found to have BP of 170s/90s  -- sent to ED and was admitted to [facility] where SBP was up to 200  - discussed importance of tight BP control   - pt reports improved compliance with BP meds and BP readings have improved  5. 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 Limited 2 sequence MRI head: Acute RIGHT periatrial subcentimeter  nonhemorrhagic infarct, in region of optic tract.  - could be limiting vision--will check formal VF once macular edema improves  6. Ocular Hypertention OU  - today, IOP 10 OD  - patient reports not taking cosopt for several months--IOP ok OU  7. Mixed cataract OD - The symptoms of cataract, surgical options, and treatments and risks were discussed with patient. - discussed diagnosis and progression  8. Pseudophakia OS  - s/p CE/IOL (07.29.21, Dr. Kathlen Mody)  - IOL in good position, doing well - monitor   Ophthalmic Meds Ordered this visit:  No orders of the defined types were placed in this encounter.    Return in about 6 days (around 05/26/2022) for f/u PDR OU, POV OD, DFE, OCT.  There are no Patient Instructions on file for this visit.   This document serves as a record of services personally performed by Gardiner Sleeper, MD, PhD. It was created on their behalf by Orvan Falconer, an ophthalmic technician. The creation of this record is the provider's dictation and/or activities during the visit.    Electronically signed by: Orvan Falconer, OA, 05/23/22  2:11 AM  This document serves as a record of services personally performed by Gardiner Sleeper, MD, PhD. It was created on their behalf by San Jetty. Owens Shark, OA an ophthalmic technician. The creation of this record is the provider's dictation and/or activities during the visit.    Electronically signed by: San Jetty. Owens Shark, [place] 12.08.2023 2:11 AM  Gardiner Sleeper, M.D., Ph.D. Diseases & Surgery of the Retina and Vitreous [facility]  I have reviewed the above  documentation for accuracy and completeness, and I agree with the above. Gardiner Sleeper, M.D., Ph.D. 05/23/22 2:13 AM   Abbreviations: M myopia (nearsighted); A astigmatism; H hyperopia (farsighted); P presbyopia; Mrx spectacle prescription;  CTL contact lenses; OD right eye; OS left eye; OU both eyes  XT exotropia; ET esotropia; PEK punctate epithelial keratitis; PEE punctate epithelial erosions; DES dry eye syndrome; MGD meibomian gland dysfunction; ATs artificial tears; PFAT's preservative free artificial tears; [place] nuclear sclerotic cataract; PSC posterior subcapsular cataract; ERM  epi-retinal membrane; PVD posterior vitreous detachment; RD retinal detachment; DM diabetes mellitus; DR diabetic retinopathy; NPDR non-proliferative diabetic retinopathy; PDR proliferative diabetic retinopathy; CSME clinically significant macular edema; DME diabetic macular edema; dbh dot blot hemorrhages; CWS cotton wool spot; POAG primary open angle glaucoma; C/D cup-to-disc ratio; HVF humphrey visual field; GVF goldmann visual field; OCT optical coherence tomography; IOP intraocular pressure; BRVO Branch retinal vein occlusion; CRVO central retinal vein occlusion; CRAO central retinal artery occlusion; BRAO branch retinal artery occlusion; RT retinal tear; SB scleral buckle; PPV pars plana vitrectomy; VH Vitreous hemorrhage; PRP panretinal laser photocoagulation; IVK intravitreal kenalog; VMT vitreomacular traction; MH Macular hole;  NVD neovascularization of the disc; NVE neovascularization elsewhere; AREDS age related eye disease study; ARMD age related macular degeneration; POAG primary open angle glaucoma; EBMD epithelial/anterior basement membrane dystrophy; ACIOL anterior chamber intraocular lens; IOL intraocular lens; PCIOL posterior chamber intraocular lens; Phaco/IOL phacoemulsification with intraocular lens placement; [place] photorefractive keratectomy; LASIK laser assisted in situ keratomileusis; HTN hypertension; DM  diabetes mellitus; COPD chronic obstructive pulmonary disease

## 2022-05-20 NOTE — Anesthesia Postprocedure Evaluation (Signed)
Anesthesia Post Note  Patient: Bill [name]  Procedure(s) Performed: TWENTY FIVE GAUGE PARS PLANA VITRECTOMY (Right: Eye) MEMBRANE PEEL (Right: Eye) PHOTOCOAGULATION WITH LASER (Right: Eye)     Patient location during evaluation: PACU Anesthesia Type: General Level of consciousness: awake and alert Pain management: pain level controlled Vital Signs Assessment: post-procedure vital signs reviewed and stable Respiratory status: spontaneous breathing, nonlabored ventilation, respiratory function stable and patient connected to nasal cannula oxygen Cardiovascular status: blood pressure returned to baseline and stable Postop Assessment: no apparent nausea or vomiting Anesthetic complications: no   There were no known notable events for this encounter.  Last Vitals:  Vitals:   05/19/22 1415 05/19/22 1430  BP: 106/70 135/71  Pulse: 95 89  Resp: 20 14  Temp:  36.6 C  SpO2: 98% 98%    Last Pain:  Vitals:   05/19/22 1430  TempSrc:   PainSc: [place]

## 2022-05-23 ENCOUNTER — Encounter (INDEPENDENT_AMBULATORY_CARE_PROVIDER_SITE_OTHER): Payer: Self-pay | Admitting: Ophthalmology

## 2022-05-23 NOTE — Progress Notes (Shared)
[facility] Note  05/26/2022     CHIEF COMPLAINT Patient presents for No chief complaint on file.   HISTORY OF PRESENT ILLNESS: Bill [name] is a 41 y.o. male who presents to the clinic today for:   Pt states last night was rough, his eye is irritated, he states the numbing drops this morning helped with the pain  Referring physician: Isaac Bliss, Rayford Halsted, MD [place],  [place] [address]  HISTORICAL INFORMATION:   Selected notes from the [facility] Referred by Dr. [name] for concern of severe NPDR / PDR OU   CURRENT MEDICATIONS: No current outpatient medications on file. (Ophthalmic Drugs)   No current facility-administered medications for this visit. (Ophthalmic Drugs)   Current Outpatient Medications (Other)  Medication Sig   atorvastatin (LIPITOR) 10 MG tablet TAKE 1 TABLET BY MOUTH EVERY DAY   cholecalciferol (VITAMIN D-400) 10 MCG (400 UNIT) TABS tablet Take 400 Units by mouth daily.   Dulaglutide (TRULICITY) 3.15 QM/0.8QP SOPN Inject 0.75 mg into the skin once a week. (Patient taking differently: Inject 0.75 mg into the skin every Tuesday.)   fluticasone (FLONASE) 50 MCG/ACT nasal spray Place 1 spray into both nostrils daily as needed for allergies or rhinitis.   Insulin Glargine Solostar (LANTUS) 100 UNIT/ML Solostar Pen Inject 6-12 Units into the skin every evening.   mycophenolate (MYFORTIC) 180 MG EC tablet Take 720 mg by mouth 2 (two) times daily.   predniSONE (DELTASONE) 5 MG tablet Take 5 mg by mouth daily with breakfast.   sertraline (ZOLOFT) 50 MG tablet Take 1 tablet (50 mg total) by mouth daily.   sulfamethoxazole-trimethoprim (BACTRIM) 400-80 MG tablet Take 1 tablet by mouth every Monday, Wednesday, and Friday.   tacrolimus (PROGRAF) 1 MG capsule Take 3-4 mg by mouth See admin instructions. 4 mg in the morning, 3 mg in the evening   No current facility-administered medications for this  visit. (Other)   REVIEW OF SYSTEMS:   ALLERGIES Allergies  Allergen Reactions   Food     Melons-itchy/headaches   PAST MEDICAL HISTORY Past Medical History:  Diagnosis Date   Anemia    Anxiety    situational   Diabetes mellitus without complication ([place])    type 2   Diabetic retinopathy ([place])    PDR OU   ESRD on dialysis ([place])    living unrelated donor renal transplant 11/05/20   History of blood transfusion    Hypertension    PONV (postoperative nausea and vomiting)    03/22/2019- only 15 years ago   Renal disorder    Stroke [facility]) 2019   vision   Past Surgical History:  Procedure Laterality Date   A/V FISTULAGRAM N/A 03/11/2019   Procedure: A/V FISTULAGRAM - Left Arm;  Surgeon: Waynetta Sandy, MD;  Location: [place] [facility];  Service: Cardiovascular;  Laterality: N/A;   arm surgery Right    Fracture   AV FISTULA PLACEMENT Left 01/25/2019   Procedure: ARTERIOVENOUS (AV) FISTULA CREATION  LEFT UPPER  ARM;  Surgeon: Serafina Mitchell, MD;  Location: [place];  Service: Vascular;  Laterality: Left;   [place] Left 03/27/2019   Procedure: SECOND STAGE BASILIC VEIN TRANSPOSITION LEFT ARM;  Surgeon: Serafina Mitchell, MD;  Location: [place];  Service: Vascular;  Laterality: Left;   KIDNEY TRANSPLANT  11/05/2020   [place] Right 05/19/2022   Procedure: MEMBRANE PEEL;  Surgeon: [name], MD;  Location: [place];  Service: Ophthalmology;  Laterality: Right;   PARS PLANA VITRECTOMY Right 05/19/2022   Procedure: TWENTY FIVE GAUGE PARS PLANA VITRECTOMY;  Surgeon: [name], MD;  Location: [place];  Service: Ophthalmology;  Laterality: Right;   PHOTOCOAGULATION WITH LASER Right 05/19/2022   Procedure: PHOTOCOAGULATION WITH LASER;  Surgeon: [name], MD;  Location: [place];  Service: Ophthalmology;  Laterality: Right;   WISDOM TOOTH EXTRACTION     FAMILY HISTORY Family History  Problem Relation Age of Onset   Diabetes  Mellitus II Mother    Hypertension Mother    Cataracts Mother    Diabetes Mellitus II Father    Stroke Father    Hypertension Father    Glaucoma Maternal Grandmother    Amblyopia Neg Hx    Blindness Neg Hx    Macular degeneration Neg Hx    Retinal detachment Neg Hx    Strabismus Neg Hx    Retinitis pigmentosa Neg Hx    SOCIAL HISTORY Social History   Tobacco Use   Smoking status: Never   Smokeless tobacco: Never  Vaping Use   Vaping Use: Never used  Substance Use Topics   Alcohol use: Not Currently   Drug use: Not Currently       OPHTHALMIC EXAM:  Not recorded    IMAGING AND PROCEDURES  Imaging and Procedures for @TODAY @          ASSESSMENT/PLAN:   ICD-10-CM   1. Proliferative diabetic retinopathy of both eyes with macular edema associated with type 2 diabetes mellitus ([place])  J49.7026     2. Vitreous hemorrhage of both eyes ([place])  H43.13     3. Essential hypertension  I10     4. Hypertensive retinopathy of both eyes  H35.033     5. Acute CVA (cerebrovascular accident) ([place])  I63.9     6. Ocular hypertension, bilateral  H40.053     7. 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 Pseudophakia  Z96.1      1,2. Proliferative diabetic retinopathy OU  - last A1c was 11.9 on 05.11.23; 9.6 on 11.29.22; 7.8 on 7.20.22; 7.2 on 01.13.22  - s/p PRP OD (10.02.19), (01.22.20), (02.12.20), (03.08.22)  - s/p PRP OS (05/02/2018), (02.19.20), (2.17.22)  - s/p IVK #1 OD (01.22.20), OS (02.12.20)  - s/p IVA OD #1 (10.21.20), #2 (12.02.20), #3 (01.13.21), #4 (02.10.21), #5 (04.19.21), #6 (05.21.21), #7 (06.21.21), #8 (08.19.21), #9 (10.11.21), #10 (12.01.21), #11 (01.25.22), #12 (03.01.22), #13 (04.05.22), #14 (6.6.22), #15 (09.22.22), #16 (03.28.23), #17 (08.04.23), #18 (11.01.23 for [facility]), #19 (11.29.23)  - s/p IVA OS #1 (12.02.20), #2 (04.19.21), #3 (05.21.21), #4 (06.21.21), #5 (08.19.21), #6 (10.11.21), #7 (12.01.21), #8 (01.25.22), #9  (03.01.22), #10 (04.05.22), #11 (6.6.22), #12 (09.22.22), #13 (12.04.23)   - FA 8.19.19 shows leaking MA, patches of capillary nonperfusion OU  - repeat FA 10.11.21 shows persistent NV OU, large patches of vascular nonperfusion OU (OS>OD) -- fill in PRP completed OU (OS  2.17.22, OD 3.8.22)  - repeat FA 9.13.22 shows interval improvement in NV/leakage  - pre-VH BCVA OD: 20/40  - s/p POW1 PPV/MP OD, 12.07.2023             - doing well             - IOP good at 10             - cont  PF 4x/day OD  zymaxid QID OD                          Atropine BID OD                          PSO ung QID OD              - sleep with head elevated             - eye shield when sleeping              - post op drop and positioning instructions reviewed              - tylenol/ibuprofen for pain              - Rx given for breakthrough pain - f/u next Thursday, POV  3,4. 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 [name] in mid August 2019 with a 2 wk history intractable headaches and decreased vision OU  - found to have BP of 170s/90s -- sent to ED and was admitted to [facility] where SBP was up to 200  - discussed importance of tight BP control   - pt reports improved compliance with BP meds and BP readings have improved  5. 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 Limited 2 sequence MRI head: Acute RIGHT periatrial subcentimeter  nonhemorrhagic infarct, in region of optic tract.  - could be limiting vision--will check formal VF once macular edema improves  6. Ocular Hypertention OU  - today, IOP 10 OD  - patient reports not taking cosopt for several months--IOP ok OU  7. Mixed cataract OD - The symptoms of cataract, surgical options, and treatments and risks were discussed with patient. - discussed diagnosis and progression  8. Pseudophakia OS  - s/p CE/IOL (07.29.21, Dr. Kathlen Mody)  - IOL in good  position, doing well - monitor   Ophthalmic Meds Ordered this visit:  No orders of the defined types were placed in this encounter.    No follow-ups on file.  There are no Patient Instructions on file for this visit.   This document serves as a record of services personally performed by Gardiner Sleeper, MD, PhD. It was created on their behalf by San Jetty. Owens Shark, OA an ophthalmic technician. The creation of this record is the provider's dictation and/or activities during the visit.    Electronically signed by: San Jetty. [place], [place] 12.11.2023 8:14 AM  Gardiner Sleeper, M.D., Ph.D. Diseases & Surgery of the Retina and Vitreous [facility]: M myopia (nearsighted); A astigmatism; H hyperopia (farsighted); P presbyopia; Mrx spectacle prescription;  CTL contact lenses; OD right eye; OS left eye; OU both eyes  XT exotropia; ET esotropia; PEK punctate epithelial keratitis; PEE punctate epithelial erosions; DES dry eye syndrome; MGD meibomian gland dysfunction; ATs artificial tears; PFAT's preservative free artificial tears; [place] nuclear sclerotic cataract; PSC posterior subcapsular cataract; ERM epi-retinal membrane; PVD posterior vitreous detachment; RD retinal detachment; DM diabetes mellitus; DR diabetic retinopathy; NPDR non-proliferative diabetic retinopathy; PDR proliferative diabetic retinopathy; CSME clinically significant macular edema; DME diabetic macular edema; dbh dot blot hemorrhages; CWS cotton wool spot; POAG primary open angle glaucoma; C/D cup-to-disc ratio; HVF humphrey visual field; GVF goldmann visual field; OCT optical coherence tomography; IOP intraocular pressure;  BRVO Branch retinal vein occlusion; CRVO central retinal vein occlusion; CRAO central retinal artery occlusion; BRAO branch retinal artery occlusion; RT retinal tear; SB scleral buckle; PPV pars plana vitrectomy; VH Vitreous hemorrhage; PRP panretinal laser photocoagulation; IVK  intravitreal kenalog; VMT vitreomacular traction; MH Macular hole;  NVD neovascularization of the disc; NVE neovascularization elsewhere; AREDS age related eye disease study; ARMD age related macular degeneration; POAG primary open angle glaucoma; EBMD epithelial/anterior basement membrane dystrophy; ACIOL anterior chamber intraocular lens; IOL intraocular lens; PCIOL posterior chamber intraocular lens; Phaco/IOL phacoemulsification with intraocular lens placement; [place] photorefractive keratectomy; LASIK laser assisted in situ keratomileusis; HTN hypertension; DM diabetes mellitus; COPD chronic obstructive pulmonary disease

## 2022-05-24 ENCOUNTER — Encounter: Payer: Self-pay | Admitting: Internal Medicine

## 2022-05-24 ENCOUNTER — Ambulatory Visit (INDEPENDENT_AMBULATORY_CARE_PROVIDER_SITE_OTHER): Payer: Medicare Other | Admitting: Internal Medicine

## 2022-05-24 VITALS — BP 124/86 | Temp 98.8°F | Ht 70.0 in | Wt 184.6 lb

## 2022-05-24 DIAGNOSIS — Z23 Encounter for immunization: Secondary | ICD-10-CM

## 2022-05-24 DIAGNOSIS — E11319 Type 2 diabetes mellitus with unspecified diabetic retinopathy without macular edema: Secondary | ICD-10-CM

## 2022-05-24 DIAGNOSIS — Z940 Kidney transplant status: Secondary | ICD-10-CM

## 2022-05-24 DIAGNOSIS — I639 Cerebral infarction, unspecified: Secondary | ICD-10-CM

## 2022-05-24 DIAGNOSIS — F411 Generalized anxiety disorder: Secondary | ICD-10-CM | POA: Diagnosis not present

## 2022-05-24 DIAGNOSIS — E1159 Type 2 diabetes mellitus with other circulatory complications: Secondary | ICD-10-CM | POA: Diagnosis not present

## 2022-05-24 DIAGNOSIS — I10 Essential (primary) hypertension: Secondary | ICD-10-CM | POA: Diagnosis not present

## 2022-05-24 DIAGNOSIS — E785 Hyperlipidemia, unspecified: Secondary | ICD-10-CM

## 2022-05-24 DIAGNOSIS — E1169 Type 2 diabetes mellitus with other specified complication: Secondary | ICD-10-CM

## 2022-05-24 LAB — POCT GLYCOSYLATED HEMOGLOBIN (HGB A1C): Hemoglobin A1C: 10.5 % — AB (ref 4.0–5.6)

## 2022-05-24 MED ORDER — SERTRALINE HCL 50 MG PO TABS: 50.0000 mg | ORAL_TABLET | Freq: Every day | ORAL | 1 refills | Status: DC

## 2022-05-24 MED ORDER — TRULICITY 1.5 MG/0.5ML ~~LOC~~ SOAJ: 1.5000 mg | SUBCUTANEOUS | 2 refills | Status: DC

## 2022-05-24 NOTE — Progress Notes (Signed)
Established Patient Office Visit     CC/Reason for Visit: Medication refills, follow-up chronic conditions  HPI: Bill [name] is a 41 y.o. male who is coming in today for the above mentioned reasons.  I have not seen him since May 2022 much has happened since then.  He got a kidney transplant that same month.  He was just released from the transplant center and followed locally by nephrologist Dr. Royce Macadamia.  He also has a history of diabetes with many complications including vascular.  He has never been known to be hypertensive but blood pressure is elevated today.  He has hyperlipidemia.  He tells me he had a procedure recently by Dr. Coralyn Pear due to hemorrhage in his right eye.  His diabetes is uncontrolled, A1c is 10.5 today.  He is requesting refills of sertraline that he takes for his generalized anxiety disorder.  He feels that is not well-controlled.   Past Medical/Surgical History: Past Medical History:  Diagnosis Date   Anemia    Anxiety    situational   Diabetes mellitus without complication ([place])    type 2   Diabetic retinopathy ([place])    PDR OU   ESRD on dialysis ([place])    living unrelated donor renal transplant 11/05/20   History of blood transfusion    Hypertension    PONV (postoperative nausea and vomiting)    03/22/2019- only 15 years ago   Renal disorder    Stroke [facility]) 2019   vision    Past Surgical History:  Procedure Laterality Date   A/V FISTULAGRAM N/A 03/11/2019   Procedure: A/V FISTULAGRAM - Left Arm;  Surgeon: Waynetta Sandy, MD;  Location: [place] [facility];  Service: Cardiovascular;  Laterality: N/A;   arm surgery Right    Fracture   AV FISTULA PLACEMENT Left 01/25/2019   Procedure: ARTERIOVENOUS (AV) FISTULA CREATION  LEFT UPPER  ARM;  Surgeon: Serafina Mitchell, MD;  Location: [place];  Service: Vascular;  Laterality: Left;   [place] Left 03/27/2019   Procedure: SECOND STAGE BASILIC VEIN TRANSPOSITION LEFT ARM;   Surgeon: Serafina Mitchell, MD;  Location: [place] [facility];  Service: Vascular;  Laterality: Left;   KIDNEY TRANSPLANT  11/05/2020   [place]   MEMBRANE PEEL Right 05/19/2022   Procedure: MEMBRANE PEEL;  Surgeon: [name], MD;  Location: [place];  Service: Ophthalmology;  Laterality: Right;   PARS PLANA VITRECTOMY Right 05/19/2022   Procedure: TWENTY FIVE GAUGE PARS PLANA VITRECTOMY;  Surgeon: [name], MD;  Location: [place];  Service: Ophthalmology;  Laterality: Right;   PHOTOCOAGULATION WITH LASER Right 05/19/2022   Procedure: PHOTOCOAGULATION WITH LASER;  Surgeon: [name], MD;  Location: [place];  Service: Ophthalmology;  Laterality: Right;   WISDOM TOOTH EXTRACTION      Social History:  reports that he has never smoked. He has never used smokeless tobacco. He reports that he does not currently use alcohol. He reports that he does not currently use drugs.  Allergies: Allergies  Allergen Reactions   Food     Melons-itchy/headaches    Family History:  Family History  Problem Relation Age of Onset   Diabetes Mellitus II Mother    Hypertension Mother    Cataracts Mother    Diabetes Mellitus II Father    Stroke Father    Hypertension Father    Glaucoma Maternal Grandmother    Amblyopia Neg Hx    Blindness Neg Hx    Macular degeneration  Neg Hx    Retinal detachment Neg Hx    Strabismus Neg Hx    Retinitis pigmentosa Neg Hx      Current Outpatient Medications:    atorvastatin (LIPITOR) 10 MG tablet, TAKE 1 TABLET BY MOUTH EVERY DAY, Disp: 90 tablet, Rfl: 1   cholecalciferol (VITAMIN D-400) 10 MCG (400 UNIT) TABS tablet, Take 400 Units by mouth daily., Disp: , Rfl:    Dulaglutide (TRULICITY) 1.5 XB/2.8UX SOPN, Inject 1.5 mg into the skin once a week., Disp: 2 mL, Rfl: 2   fluticasone (FLONASE) 50 MCG/ACT nasal spray, Place 1 spray into both nostrils daily as needed for allergies or rhinitis., Disp: , Rfl:    Insulin Glargine Solostar (LANTUS) 100 UNIT/ML Solostar Pen,  Inject 6-12 Units into the skin every evening., Disp: , Rfl:    mycophenolate (MYFORTIC) 180 MG EC tablet, Take 720 mg by mouth 2 (two) times daily., Disp: , Rfl:    predniSONE (DELTASONE) 5 MG tablet, Take 5 mg by mouth daily with breakfast., Disp: , Rfl:    sulfamethoxazole-trimethoprim (BACTRIM) 400-80 MG tablet, Take 1 tablet by mouth every Monday, Wednesday, and Friday., Disp: , Rfl:    tacrolimus (PROGRAF) 1 MG capsule, Take 3-4 mg by mouth See admin instructions. 4 mg in the morning, 3 mg in the evening, Disp: , Rfl:    sertraline (ZOLOFT) 50 MG tablet, Take 1 tablet (50 mg total) by mouth daily., Disp: 90 tablet, Rfl: 1  Review of Systems:  Constitutional: Denies fever, chills, diaphoresis, appetite change and fatigue.  HEENT: Denies photophobia, eye pain, redness, hearing loss, ear pain, congestion, sore throat, rhinorrhea, sneezing, mouth sores, trouble swallowing, neck pain, neck stiffness and tinnitus.   Respiratory: Denies SOB, DOE, cough, chest tightness,  and wheezing.   Cardiovascular: Denies chest pain, palpitations and leg swelling.  Gastrointestinal: Denies nausea, vomiting, abdominal pain, diarrhea, constipation, blood in stool and abdominal distention.  Genitourinary: Denies dysuria, urgency, frequency, hematuria, flank pain and difficulty urinating.  Endocrine: Denies: hot or cold intolerance, sweats, changes in hair or nails, polyuria, polydipsia. Musculoskeletal: Denies myalgias, back pain, joint swelling, arthralgias and gait problem.  Skin: Denies pallor, rash and wound.  Neurological: Denies dizziness, seizures, syncope, weakness, light-headedness, numbness and headaches.  Hematological: Denies adenopathy. Easy bruising, personal or family bleeding history  Psychiatric/Behavioral: Denies suicidal ideation, mood changes, confusion, nervousness, sleep disturbance and agitation    Physical Exam: Vitals:   05/24/22 0929 05/24/22 0934  BP: (!) 150/70 124/86  Temp:  98.8 F (37.1 C)   TempSrc: Oral   Weight: 184 lb 9.6 oz (83.7 kg)   Height: 5\' 10"  (1.778 m)     Body mass index is 26.49 kg/m.   Constitutional: NAD, calm, comfortable Eyes: PERRL, significant right-sided scleral and conjunctival injection ENMT: Mucous membranes are moist.  Respiratory: clear to auscultation bilaterally, no wheezing, no crackles. Normal respiratory effort. No accessory muscle use.  Cardiovascular: Regular rate and rhythm, no murmurs / rubs / gallops. No extremity edema.  Psychiatric: Normal judgment and insight. Alert and oriented x 3. Normal mood.     Impression and Plan:  Type 2 diabetes mellitus with vascular disease ([facility]) - Plan: POCT glycosylated hemoglobin (Hb A1C), Urine microalbumin-creatinine with uACR, Dulaglutide (TRULICITY) 1.5 LK/4.4WN SOPN  GAD (generalized anxiety disorder) - Plan: sertraline (ZOLOFT) 50 MG tablet  Hyperlipidemia associated with type 2 diabetes mellitus ([place])  Primary hypertension  Kidney transplant status  Need for influenza vaccination  Diabetic retinopathy associated with type 2 diabetes mellitus,  macular edema presence unspecified, unspecified laterality, unspecified retinopathy severity (HCC)  -A1c of 10.5 demonstrates poor diabetic control.  I will increase Trulicity from 4.44 to 1.5 mg weekly, he is not consistent in taking his 5 units of bedtime Lantus, we have discussed importance of compliance. -Blood pressure is noted to be elevated today, I have asked that he do ambulatory blood pressure monitoring and return in 3 months for follow-up. -Flu vaccine administered in office today. -I will refill his sertraline today.  He has been out of this for about 3 to 4 weeks which is likely what is causing increase in anxiety symptoms.  Time spent:34 minutes reviewing chart, interviewing and examining patient and formulating plan of care.      Lelon Frohlich, MD [facility]

## 2022-05-24 NOTE — Addendum Note (Signed)
Addended by: Westley Hummer B on: 05/24/2022 10:20 AM   Modules accepted: Orders

## 2022-05-26 ENCOUNTER — Encounter (INDEPENDENT_AMBULATORY_CARE_PROVIDER_SITE_OTHER): Payer: Self-pay

## 2022-05-26 ENCOUNTER — Encounter (INDEPENDENT_AMBULATORY_CARE_PROVIDER_SITE_OTHER): Payer: Self-pay | Admitting: Ophthalmology

## 2022-05-26 ENCOUNTER — Encounter (INDEPENDENT_AMBULATORY_CARE_PROVIDER_SITE_OTHER): Payer: Medicare Other | Admitting: Ophthalmology

## 2022-05-26 ENCOUNTER — Ambulatory Visit (INDEPENDENT_AMBULATORY_CARE_PROVIDER_SITE_OTHER): Payer: Medicare Other | Admitting: Ophthalmology

## 2022-05-26 DIAGNOSIS — H40053 Ocular hypertension, bilateral: Secondary | ICD-10-CM

## 2022-05-26 DIAGNOSIS — Z961 Presence of intraocular lens: Secondary | ICD-10-CM

## 2022-05-26 DIAGNOSIS — I10 Essential (primary) hypertension: Secondary | ICD-10-CM

## 2022-05-26 DIAGNOSIS — E113513 Type 2 diabetes mellitus with proliferative diabetic retinopathy with macular edema, bilateral: Secondary | ICD-10-CM

## 2022-05-26 DIAGNOSIS — H25811 Combined forms of age-related cataract, right eye: Secondary | ICD-10-CM

## 2022-05-26 DIAGNOSIS — H35033 Hypertensive retinopathy, bilateral: Secondary | ICD-10-CM

## 2022-05-26 DIAGNOSIS — H4313 Vitreous hemorrhage, bilateral: Secondary | ICD-10-CM

## 2022-05-26 DIAGNOSIS — I639 Cerebral infarction, unspecified: Secondary | ICD-10-CM

## 2022-05-26 MED ORDER — BACITRACIN-POLYMYXIN B 500-10000 UNIT/GM OP OINT: TOPICAL_OINTMENT | Freq: Every day | OPHTHALMIC | 0 refills | Status: AC

## 2022-05-26 MED ORDER — PREDNISOLONE ACETATE 1 % OP SUSP: 1.0000 [drp] | Freq: Four times a day (QID) | OPHTHALMIC | 0 refills | Status: AC

## 2022-05-26 NOTE — Progress Notes (Signed)
[facility] Note  05/26/2022     CHIEF COMPLAINT Patient presents for Retina Follow Up   HISTORY OF PRESENT ILLNESS: Bill [name] is a 41 y.o. male who presents to the clinic today for:  HPI     Retina Follow Up   Patient presents with  Diabetic Retinopathy.  In both eyes.  Severity is moderate.  Duration of 6 days.  Since onset it is gradually improving.  I, the attending physician,  performed the HPI with the patient and updated documentation appropriately.        Comments   Pt here for 6 day ret f/u PDR OU/POV OD, s/p PPV+MP OD on 12.7.23. Pt states VA is improving OD.       Last edited by [name], MD on 05/26/2022 10:15 PM.    Pt states this past week was "not too bad", his vision is improving, he is using drops as directed  Referring physician: Isaac Bliss, Rayford Halsted, MD [place],  [place] [address]  HISTORICAL INFORMATION:   Selected notes from the MEDICAL RECORD NUMBER Referred by Dr. [name] for concern of severe NPDR / PDR OU   CURRENT MEDICATIONS: Current Outpatient Medications (Ophthalmic Drugs)  Medication Sig   bacitracin-polymyxin b (POLYSPORIN) ophthalmic ointment Place into the right eye at bedtime for 10 days. Place a 1/2 inch ribbon of ointment into the lower eyelid.   prednisoLONE acetate (PRED FORTE) 1 % ophthalmic suspension Place 1 drop into the right eye 4 (four) times daily.   No current facility-administered medications for this visit. (Ophthalmic Drugs)   Current Outpatient Medications (Other)  Medication Sig   atorvastatin (LIPITOR) 10 MG tablet TAKE 1 TABLET BY MOUTH EVERY DAY   cholecalciferol (VITAMIN D-400) 10 MCG (400 UNIT) TABS tablet Take 400 Units by mouth daily.   Dulaglutide (TRULICITY) 1.5 KG/2.5KY SOPN Inject 1.5 mg into the skin once a week.   fluticasone (FLONASE) 50 MCG/ACT nasal spray Place 1 spray into both nostrils daily as needed for allergies or rhinitis.    Insulin Glargine Solostar (LANTUS) 100 UNIT/ML Solostar Pen Inject 6-12 Units into the skin every evening.   mycophenolate (MYFORTIC) 180 MG EC tablet Take 720 mg by mouth 2 (two) times daily.   predniSONE (DELTASONE) 5 MG tablet Take 5 mg by mouth daily with breakfast.   sertraline (ZOLOFT) 50 MG tablet Take 1 tablet (50 mg total) by mouth daily.   sulfamethoxazole-trimethoprim (BACTRIM) 400-80 MG tablet Take 1 tablet by mouth every Monday, Wednesday, and Friday.   tacrolimus (PROGRAF) 1 MG capsule Take 3-4 mg by mouth See admin instructions. 4 mg in the morning, 3 mg in the evening   No current facility-administered medications for this visit. (Other)   REVIEW OF SYSTEMS: ROS   Positive for: Neurological, Genitourinary, Endocrine, Cardiovascular, Eyes, Psychiatric Negative for: Constitutional, Gastrointestinal, Skin, Musculoskeletal, HENT, Respiratory, Allergic/Imm, Heme/Lymph Last edited by Kingsley Spittle, COT on 05/26/2022  9:25 AM.     ALLERGIES Allergies  Allergen Reactions   Food     Melons-itchy/headaches   PAST MEDICAL HISTORY Past Medical History:  Diagnosis Date   Anemia    Anxiety    situational   Diabetes mellitus without complication ([place])    type 2   Diabetic retinopathy ([place])    PDR OU   ESRD on dialysis ([place])    living unrelated donor renal transplant 11/05/20   History of blood transfusion    Hypertension  PONV (postoperative nausea and vomiting)    03/22/2019- only 15 years ago   Renal disorder    Stroke [facility]) 2019   vision   Past Surgical History:  Procedure Laterality Date   A/V FISTULAGRAM N/A 03/11/2019   Procedure: A/V FISTULAGRAM - Left Arm;  Surgeon: Waynetta Sandy, MD;  Location: [facility];  Service: Cardiovascular;  Laterality: N/A;   arm surgery Right    Fracture   AV FISTULA PLACEMENT Left 01/25/2019   Procedure: ARTERIOVENOUS (AV) FISTULA CREATION  LEFT UPPER  ARM;  Surgeon: Serafina Mitchell, MD;  Location: [facility];  Service: Vascular;  Laterality: Left;   [place] Left 03/27/2019   Procedure: SECOND STAGE BASILIC VEIN TRANSPOSITION LEFT ARM;  Surgeon: Serafina Mitchell, MD;  Location: [facility];  Service: Vascular;  Laterality: Left;   KIDNEY TRANSPLANT  11/05/2020   [place]   MEMBRANE PEEL Right 05/19/2022   Procedure: MEMBRANE PEEL;  Surgeon: [name], MD;  Location: [facility];  Service: Ophthalmology;  Laterality: Right;   PARS PLANA VITRECTOMY Right 05/19/2022   Procedure: TWENTY FIVE GAUGE PARS PLANA VITRECTOMY;  Surgeon: [name], MD;  Location: [facility];  Service: Ophthalmology;  Laterality: Right;   PHOTOCOAGULATION WITH LASER Right 05/19/2022   Procedure: PHOTOCOAGULATION WITH LASER;  Surgeon: [name], MD;  Location: [facility];  Service: Ophthalmology;  Laterality: Right;   WISDOM TOOTH EXTRACTION     FAMILY HISTORY Family History  Problem Relation Age of Onset   Diabetes Mellitus II Mother    Hypertension Mother    Cataracts Mother    Diabetes Mellitus II Father    Stroke Father    Hypertension Father    Glaucoma Maternal Grandmother    Amblyopia Neg Hx    Blindness Neg Hx    Macular degeneration Neg Hx    Retinal detachment Neg Hx    Strabismus Neg Hx    Retinitis pigmentosa Neg Hx    SOCIAL HISTORY Social History   Tobacco Use   Smoking status: Never   Smokeless tobacco: Never  Vaping Use   Vaping Use: Never used  Substance Use Topics   Alcohol use: Not Currently   Drug use: Not Currently       OPHTHALMIC EXAM:  Base Eye Exam     Visual Acuity (Snellen - Linear)       Right Left   Dist [place] 20/200 20/200 -1   Dist ph [place] 20/100 NI         Tonometry (Tonopen, 9:34 AM)       Right Left   Pressure 10 14         Pupils       Dark Light Shape React APD   Right 6 6 Round NR None   Left 4 3 Round Brisk +1         Visual Fields       Left Right   Restrictions Total superior temporal, superior nasal deficiencies;  Partial outer inferior temporal, inferior nasal deficiencies Partial outer inferior temporal deficiency; Partial inner inferior nasal deficiency         Extraocular Movement       Right Left    Full, Ortho Full, Ortho         Neuro/Psych     Oriented x3: Yes   Mood/Affect: Normal         Dilation     Both eyes: 1.0% Mydriacyl, 2.5% Phenylephrine @ 9:34 AM  Slit Lamp and Fundus Exam     Slit Lamp Exam       Right Left   Lids/Lashes Dermatochalasis - upper lid Normal   Conjunctiva/Sclera mild melanosis, Subconjunctival hemorrhage -- temporally White and quiet   Cornea Trace Punctate epithelial erosions, epi defect closed, tear film debris Trace Punctate epithelial erosions, well healed cataract wound   Anterior Chamber deep, 1-2+cell/pigment deep, 2-3+fine cell and pigment   Iris Round and dilated, No NVI Round and dilated, no NVI   Lens 1-2+ Nuclear sclerosis, 1-2+ Cortical cataract, 2+Posterior subcapsular cataract PC IOL in good position   Anterior Vitreous post vitrectomy, VH cleared Syneresis, , blood stained vitreous condensations         Fundus Exam       Right Left   Disc 2+ mild Pallor, Sharp rim, fibrosis gone No view   C/D Ratio 0.1 0.2   Macula Flat, Good foveal reflex, scattered MA/DBH, fibrosis improved No view   Vessels Severe attenuation, Tortuous, +fibrosis, +NV inferior to disc -- regressing Sclerotic arterioles, Tortuous, +NV -- regressing, fibrosis along superior arcades, severe Vascular attenuation   Periphery Attached, scattered MA, good fill in PRP 360 extending to arcades Hazy view, superior retina visible with PRP           IMAGING AND PROCEDURES  Imaging and Procedures for _0 @          ASSESSMENT/PLAN:   ICD-10-CM   1. Proliferative diabetic retinopathy of both eyes with macular edema associated with type 2 diabetes mellitus ([place])  V69.7948     2. Vitreous hemorrhage of both eyes ([place])  H43.13     3.  Essential hypertension  I10     4. Hypertensive retinopathy of both eyes  H35.033     5. Acute CVA (cerebrovascular accident) ([place])  I63.9     6. Ocular hypertension, bilateral  H40.053     7. 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 Pseudophakia  Z96.1      1,2. Proliferative diabetic retinopathy OU  - last A1c was 11.9 on 05.11.23; 9.6 on 11.29.22; 7.8 on 7.20.22; 7.2 on 01.13.22  - s/p PRP OD (10.02.19), (01.22.20), (02.12.20), (03.08.22)  - s/p PRP OS (05/02/2018), (02.19.20), (2.17.22)  - s/p IVK #1 OD (01.22.20), OS (02.12.20)  - s/p IVA OD #1 (10.21.20), #2 (12.02.20), #3 (01.13.21), #4 (02.10.21), #5 (04.19.21), #6 (05.21.21), #7 (06.21.21), #8 (08.19.21), #9 (10.11.21), #10 (12.01.21), #11 (01.25.22), #12 (03.01.22), #13 (04.05.22), #14 (6.6.22), #15 (09.22.22), #16 (03.28.23), #17 (08.04.23), #18 (11.01.23 for [facility]), #19 (11.29.23)  - s/p IVA OS #1 (12.02.20), #2 (04.19.21), #3 (05.21.21), #4 (06.21.21), #5 (08.19.21), #6 (10.11.21), #7 (12.01.21), #8 (01.25.22), #9 (03.01.22), #10 (04.05.22), #11 (6.6.22), #12 (09.22.22), #13 (12.04.23)   - FA 8.19.19 shows leaking MA, patches of capillary nonperfusion OU  - repeat FA 10.11.21 shows persistent NV OU, large patches of vascular nonperfusion OU (OS>OD) -- fill in PRP completed OU (OS  2.17.22, OD 3.8.22)  - repeat FA 9.13.22 shows interval improvement in NV/leakage  - pre-VH BCVA OD: 20/40  - s/p POW1 s/p PPV/MP OD, 12.07.2023             - doing well   - BCVA 20/100 OD             - IOP good at 10             - cont   PF 4x/day OD  zymaxid QID OD -- stop on Monday, 12.18.23                         Atropine BID OD -- okay to stop now                         PSO ung QID OD -- decrease to QHS/PRN             - sleep with head elevated             - eye shield when sleeping x1 more week             - post op drop and positioning instructions reviewed              -  tylenol/ibuprofen for pain  - f/u 2-3 weeks, POV  3,4. 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 [name] in mid August 2019 with a 2 wk history intractable headaches and decreased vision OU  - found to have BP of 170s/90s -- sent to ED and was admitted to [facility] where SBP was up to 200  - discussed importance of tight BP control   - pt reports improved compliance with BP meds and BP readings have improved  5. 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 Limited 2 sequence MRI head: Acute RIGHT periatrial subcentimeter  nonhemorrhagic infarct, in region of optic tract.  - could be limiting vision--will check formal VF once macular edema improves  6. Ocular Hypertention OU  - today, IOP 10 OD  - patient reports not taking cosopt for several months--IOP ok OU  7. Mixed cataract OD - The symptoms of cataract, surgical options, and treatments and risks were discussed with patient. - discussed diagnosis and progression  8. Pseudophakia OS  - s/p CE/IOL (07.29.21, Dr. Kathlen Mody)  - IOL in good position, doing well - monitor   Ophthalmic Meds Ordered this visit:  Meds ordered this encounter  Medications   prednisoLONE acetate (PRED FORTE) 1 % ophthalmic suspension    Sig: Place 1 drop into the right eye 4 (four) times daily.    Dispense:  15 mL    Refill:  0   bacitracin-polymyxin b (POLYSPORIN) ophthalmic ointment    Sig: Place into the right eye at bedtime for 10 days. Place a 1/2 inch ribbon of ointment into the lower eyelid.    Dispense:  3.5 g    Refill:  0     Return for 2-3 wks - POV OD, VH OS, Dilated Exam, OCT.  There are no Patient Instructions on file for this visit.   This document serves as a record of services personally performed by Gardiner Sleeper, MD, PhD. It was created on their behalf by San Jetty. Owens Shark, OA an ophthalmic technician. The creation of this record is the provider's dictation  and/or activities during the visit.    Electronically signed by: San Jetty. Owens Shark, [place] 12.14.2023 10:25 PM  Gardiner Sleeper, M.D., Ph.D. Diseases & Surgery of the Retina and Vitreous [facility]  I have reviewed the above documentation for accuracy and completeness, and I agree with the above. Gardiner Sleeper, M.D., Ph.D. 05/26/22 10:25 PM  Abbreviations: M myopia (nearsighted); A astigmatism; H hyperopia (farsighted); P presbyopia; Mrx spectacle prescription;  CTL contact lenses; OD right eye; OS left  eye; OU both eyes  XT exotropia; ET esotropia; PEK punctate epithelial keratitis; PEE punctate epithelial erosions; DES dry eye syndrome; MGD meibomian gland dysfunction; ATs artificial tears; PFAT's preservative free artificial tears; [place] nuclear sclerotic cataract; PSC posterior subcapsular cataract; ERM epi-retinal membrane; PVD posterior vitreous detachment; RD retinal detachment; DM diabetes mellitus; DR diabetic retinopathy; NPDR non-proliferative diabetic retinopathy; PDR proliferative diabetic retinopathy; CSME clinically significant macular edema; DME diabetic macular edema; dbh dot blot hemorrhages; CWS Bill wool spot; POAG primary open angle glaucoma; C/D cup-to-disc ratio; HVF humphrey visual field; GVF goldmann visual field; OCT optical coherence tomography; IOP intraocular pressure; BRVO Branch retinal vein occlusion; CRVO central retinal vein occlusion; CRAO central retinal artery occlusion; BRAO branch retinal artery occlusion; RT retinal tear; SB scleral buckle; PPV pars plana vitrectomy; VH Vitreous hemorrhage; PRP panretinal laser photocoagulation; IVK intravitreal kenalog; VMT vitreomacular traction; MH Macular hole;  NVD neovascularization of the disc; NVE neovascularization elsewhere; AREDS age related eye disease study; ARMD age related macular degeneration; POAG primary open angle glaucoma; EBMD epithelial/anterior basement membrane dystrophy; ACIOL anterior  chamber intraocular lens; IOL intraocular lens; PCIOL posterior chamber intraocular lens; Phaco/IOL phacoemulsification with intraocular lens placement; [place] photorefractive keratectomy; LASIK laser assisted in situ keratomileusis; HTN hypertension; DM diabetes mellitus; COPD chronic obstructive pulmonary disease

## 2022-05-27 ENCOUNTER — Encounter (INDEPENDENT_AMBULATORY_CARE_PROVIDER_SITE_OTHER): Payer: Self-pay

## 2022-05-27 ENCOUNTER — Encounter (INDEPENDENT_AMBULATORY_CARE_PROVIDER_SITE_OTHER): Payer: Medicare Other | Admitting: Ophthalmology

## 2022-06-01 NOTE — Progress Notes (Signed)
[facility] Note  06/14/2022     CHIEF COMPLAINT Patient presents for Retina Follow Up   HISTORY OF PRESENT ILLNESS: Bill [name] is a 41 y.o. male who presents to the clinic today for:  HPI     Retina Follow Up   Patient presents with  Diabetic Retinopathy.  In both eyes.  This started weeks ago.  Severity is moderate.  Duration of 3 days.  Since onset it is gradually improving.  I, the attending physician,  performed the HPI with the patient and updated documentation appropriately.        Comments   Patient feels that the right eye is getting better. He is Pred OD QID. His blood sugar was 130's?      Last edited by [name], MD on 06/17/2022 10:01 PM.     Pt states vision is improving, he is using PF QID OD   Referring physician: Isaac Bliss, Rayford Halsted, MD [place],  [place] [id number]  HISTORICAL INFORMATION:   Selected notes from the MEDICAL RECORD NUMBER Referred by Dr. [name] for concern of severe NPDR / PDR OU   CURRENT MEDICATIONS: Current Outpatient Medications (Ophthalmic Drugs)  Medication Sig   prednisoLONE acetate (PRED FORTE) 1 % ophthalmic suspension Place 1 drop into the right eye 4 (four) times daily.   No current facility-administered medications for this visit. (Ophthalmic Drugs)   Current Outpatient Medications (Other)  Medication Sig   atorvastatin (LIPITOR) 10 MG tablet TAKE 1 TABLET BY MOUTH EVERY DAY   cholecalciferol (VITAMIN D-400) 10 MCG (400 UNIT) TABS tablet Take 400 Units by mouth daily.   Dulaglutide (TRULICITY) 1.5 IO/9.7DZ SOPN Inject 1.5 mg into the skin once a week.   fluticasone (FLONASE) 50 MCG/ACT nasal spray Place 1 spray into both nostrils daily as needed for allergies or rhinitis.   Insulin Glargine Solostar (LANTUS) 100 UNIT/ML Solostar Pen Inject 6-12 Units into the skin every evening.   mycophenolate (MYFORTIC) 180 MG EC tablet Take 720 mg by mouth 2 (two) times  daily.   predniSONE (DELTASONE) 5 MG tablet Take 5 mg by mouth daily with breakfast.   sertraline (ZOLOFT) 50 MG tablet Take 1 tablet (50 mg total) by mouth daily.   sulfamethoxazole-trimethoprim (BACTRIM) 400-80 MG tablet Take 1 tablet by mouth every Monday, Wednesday, and Friday.   tacrolimus (PROGRAF) 1 MG capsule Take 3-4 mg by mouth See admin instructions. 4 mg in the morning, 3 mg in the evening   No current facility-administered medications for this visit. (Other)   REVIEW OF SYSTEMS: ROS   Positive for: Neurological, Genitourinary, Endocrine, Cardiovascular, Eyes, Psychiatric Negative for: Constitutional, Gastrointestinal, Skin, Musculoskeletal, HENT, Respiratory, Allergic/Imm, Heme/Lymph Last edited by Annie Paras, COT on 06/14/2022  8:31 AM.     ALLERGIES Allergies  Allergen Reactions   Food     Melons-itchy/headaches   PAST MEDICAL HISTORY Past Medical History:  Diagnosis Date   Anemia    Anxiety    situational   Diabetes mellitus without complication ([place])    type 2   Diabetic retinopathy ([place])    PDR OU   ESRD on dialysis ([place])    living unrelated donor renal transplant 11/05/20   History of blood transfusion    Hypertension    PONV (postoperative nausea and vomiting)    03/22/2019- only 15 years ago   Renal disorder    Stroke ([place]) 2019   vision   Past  Surgical History:  Procedure Laterality Date   A/V FISTULAGRAM N/A 03/11/2019   Procedure: A/V FISTULAGRAM - Left Arm;  Surgeon: Waynetta Sandy, MD;  Location: [facility];  Service: Cardiovascular;  Laterality: N/A;   arm surgery Right    Fracture   AV FISTULA PLACEMENT Left 01/25/2019   Procedure: ARTERIOVENOUS (AV) FISTULA CREATION  LEFT UPPER  ARM;  Surgeon: Serafina Mitchell, MD;  Location: [facility];  Service: Vascular;  Laterality: Left;   [place] Left 03/27/2019   Procedure: SECOND STAGE BASILIC VEIN TRANSPOSITION LEFT ARM;  Surgeon: Serafina Mitchell, MD;   Location: [facility];  Service: Vascular;  Laterality: Left;   KIDNEY TRANSPLANT  11/05/2020   [place]   MEMBRANE PEEL Right 05/19/2022   Procedure: MEMBRANE PEEL;  Surgeon: [name], MD;  Location: [facility];  Service: Ophthalmology;  Laterality: Right;   PARS PLANA VITRECTOMY Right 05/19/2022   Procedure: TWENTY FIVE GAUGE PARS PLANA VITRECTOMY;  Surgeon: [name], MD;  Location: [facility];  Service: Ophthalmology;  Laterality: Right;   PHOTOCOAGULATION WITH LASER Right 05/19/2022   Procedure: PHOTOCOAGULATION WITH LASER;  Surgeon: [name], MD;  Location: [facility];  Service: Ophthalmology;  Laterality: Right;   WISDOM TOOTH EXTRACTION     FAMILY HISTORY Family History  Problem Relation Age of Onset   Diabetes Mellitus II Mother    Hypertension Mother    Cataracts Mother    Diabetes Mellitus II Father    Stroke Father    Hypertension Father    Glaucoma Maternal Grandmother    Amblyopia Neg Hx    Blindness Neg Hx    Macular degeneration Neg Hx    Retinal detachment Neg Hx    Strabismus Neg Hx    Retinitis pigmentosa Neg Hx    SOCIAL HISTORY Social History   Tobacco Use   Smoking status: Never   Smokeless tobacco: Never  Vaping Use   Vaping Use: Never used  Substance Use Topics   Alcohol use: Not Currently   Drug use: Not Currently       OPHTHALMIC EXAM:  Base Eye Exam     Visual Acuity (Snellen - Linear)       Right Left   Dist [place] 20/150 20/80 +2   Dist ph [place] 20/40 +1 NI         Tonometry (Tonopen, 8:42 AM)       Right Left   Pressure 15 17         Pupils       Dark Light Shape React APD   Right 6 6 Round NR None   Left 4 4 Round Brisk +1         Visual Fields       Left Right   Restrictions Total superior temporal, superior nasal deficiencies; Partial outer inferior temporal, inferior nasal deficiencies Partial outer inferior temporal deficiency; Partial inner inferior nasal deficiency         Extraocular Movement       Right  Left    Full, Ortho Full, Ortho         Neuro/Psych     Oriented x3: Yes   Mood/Affect: Normal         Dilation     Both eyes: 1.0% Mydriacyl, 2.5% Phenylephrine @ 8:40 AM           Slit Lamp and Fundus Exam     Slit Lamp Exam  Right Left   Lids/Lashes Dermatochalasis - upper lid Normal   Conjunctiva/Sclera mild melanosis, Subconjunctival hemorrhage -- improving, sutures dissolving White and quiet   Cornea Trace Punctate epithelial erosions, epi defect closed, tear film debris Trace Punctate epithelial erosions, well healed cataract wound   Anterior Chamber deep, 1-2+cell/pigment deep, 2-3+fine cell and pigment   [place]ris Round and dilated, No NVI Round and dilated, no NVI   Lens 2+ Nuclear sclerosis, 2+ Cortical cataract, 2+Posterior subcapsular cataract PC IOL in good position   Anterior Vitreous post vitrectomy, VH cleared Syneresis, blood stained vitreous condensations -- slightly improved, blood clots settled inferiorly         Fundus Exam       Right Left   Disc 3+ mild Pallor, Sharp rim, fibrosis gone 3+ Pallor, fibrosis   C/D Ratio 0.1 0.2   Macula Flat, Good foveal reflex, scattered MA/DBH, fibrosis improved Flat, Blunted foveal reflex, ERM   Vessels Severe attenuation, Tortuous, +fibrosis - improved, +NV inferior to disc -- regressing, +sheathing Sclerotic arterioles, Tortuous, +NV -- regressing, fibrosis along superior arcades, severe Vascular attenuation   Periphery Attached, scattered MA, good fill in PRP 360 extending to arcades, pre retinal heme settled inferiorly Attached, 360 PRP           Refraction     Manifest Refraction       Sphere Cylinder Axis Dist VA   Right -2.00 +0.25 007 20/50   Left [place]               IMAGING AND PROCEDURES  Imaging and Procedures for @TODAY @  OCT, Retina - OU - Both Eyes       Right Eye Quality was good. Central Foveal Thickness: 346. Progression has improved. Findings include no SRF, abnormal  foveal contour, epiretinal membrane, intraretinal fluid (Mild ERM with sharpening of foveal contour, +IRF/cystic changes superior macula, interval improvement in vitreous opacities and tractional fibrosis).   Left Eye Quality was poor. Central Foveal Thickness: 297. Progression has improved. Findings include (Interval improvement in vitreous opacities).   Notes *Images captured and stored on drive  Diagnosis / Impression:  PDR w/ tractional fibrosis OU OD: Mild ERM with sharpening of foveal contour, +IRF/cystic changes superior macula, interval improvement in vitreous opacities and tractional fibrosis OS: Interval improvement in vitreous opacities  Clinical management:  See below  Abbreviations: NFP - Normal foveal profile. CME - cystoid macular edema. PED - pigment epithelial detachment. IRF - intraretinal fluid. SRF - subretinal fluid. EZ - ellipsoid zone. ERM - epiretinal membrane. ORA - outer retinal atrophy. ORT - outer retinal tubulation. SRHM - subretinal hyper-reflective material       Intravitreal Injection, Pharmacologic Agent - OD - Right Eye       Time Out 06/14/2022. 9:10 AM. Confirmed correct patient, procedure, site, and patient consented.   Anesthesia Topical anesthesia was used. Anesthetic medications included Lidocaine 2%, Proparacaine 0.5%.   Procedure Preparation included 5% betadine to ocular surface, eyelid speculum. A supplied (32g) needle was used.   Injection: 1.25 mg Bevacizumab 1.25mg /0.58ml   Route: Intravitreal, Site: Right Eye   NDC: H061816, Lot: [id number] , Expiration date: 06/29/2022   Post-op Post injection exam found visual acuity of at least counting fingers. The patient tolerated the procedure well. There were no complications. The patient received written and verbal post procedure care education. Post injection medications were not given.      Intravitreal Injection, Pharmacologic Agent - OS - Left Eye       Time Out  06/14/2022.  9:10 AM. Confirmed correct patient, procedure, site, and patient consented.   Anesthesia Topical anesthesia was used. Anesthetic medications included Lidocaine 2%, Proparacaine 0.5%.   Procedure Preparation included 5% betadine to ocular surface, eyelid speculum. A supplied (32g) needle was used.   Injection: 1.25 mg Bevacizumab 1.25mg /0.59ml   Route: Intravitreal, Site: Left Eye   NDC: H061816, Lot: [id number], Expiration date: 07/13/2022   Post-op Post injection exam found visual acuity of at least counting fingers. The patient tolerated the procedure well. There were no complications. The patient received written and verbal post procedure care education. Post injection medications were not given.            ASSESSMENT/PLAN:   ICD-10-CM   1. Proliferative diabetic retinopathy of both eyes with macular edema associated with type 2 diabetes mellitus (HCC)  E11.3513 OCT, Retina - OU - Both Eyes    Intravitreal Injection, Pharmacologic Agent - OD - Right Eye    Intravitreal Injection, Pharmacologic Agent - OS - Left Eye    Bevacizumab (AVASTIN) SOLN 1.25 mg    Bevacizumab (AVASTIN) SOLN 1.25 mg    2. Vitreous hemorrhage of both eyes (HCC)  H43.13 OCT, Retina - OU - Both Eyes    3. Essential hypertension  I10     4. Hypertensive retinopathy of both eyes  H35.033     5. Acute CVA (cerebrovascular accident) ([place])  I63.9     6. Ocular hypertension, bilateral  H40.053     7. 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 Pseudophakia  Z96.1       1,2. Proliferative diabetic retinopathy OU  - last A1c was 11.9 on 05.11.23; 9.6 on 11.29.22; 7.8 on 7.20.22; 7.2 on 01.13.22  - s/p PRP OD (10.02.19), (01.22.20), (02.12.20), (03.08.22)  - s/p PRP OS (05/02/2018), (02.19.20), (2.17.22)  - s/p IVK #1 OD (01.22.20), OS (02.12.20)  - s/p IVA OD #1 (10.21.20), #2 (12.02.20), #3 (01.13.21), #4 (02.10.21), #5 (04.19.21), #6 (05.21.21), #7 (06.21.21), #8 (08.19.21),  #9 (10.11.21), #10 (12.01.21), #11 (01.25.22), #12 (03.01.22), #13 (04.05.22), #14 (6.6.22), #15 (09.22.22), #16 (03.28.23), #17 (08.04.23), #18 (11.01.23 for [facility]), #19 (11.29.23)  - s/p IVA OS #1 (12.02.20), #2 (04.19.21), #3 (05.21.21), #4 (06.21.21), #5 (08.19.21), #6 (10.11.21), #7 (12.01.21), #8 (01.25.22), #9 (03.01.22), #10 (04.05.22), #11 (6.6.22), #12 (09.22.22), #13 (12.04.23)   - FA 8.19.19 shows leaking MA, patches of capillary nonperfusion OU  - repeat FA 10.11.21 shows persistent NV OU, large patches of vascular nonperfusion OU (OS>OD) -- fill in PRP completed OU (OS  2.17.22, OD 3.8.22)  - repeat FA 9.13.22 shows interval improvement in NV/leakage  - pre-VH BCVA OD: 20/40  - recommend IVA OU (OD #20 and OS #14) today, 01.02.23  - pt wishes to proceed with injections  - RBA of procedure discussed, questions answered - informed consent obtained and signed - see procedure note  - s/p POW4 s/p PPV/MP OD, 12.07.2023             - doing well   - BCVA 20/40 OD -- improved             - IOP good at 15             - cont   PF 4x/day OD                          PSO ung QID OD -- decrease to QHS/PRN             -  sleep with head elevated             - eye shield when sleeping x1 more week             - post op drop and positioning instructions reviewed              - tylenol/ibuprofen for pain  - f/u 4 weeks, POV  3,4. 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 [name] in mid August 2019 with a 2 wk history intractable headaches and decreased vision OU  - found to have BP of 170s/90s -- sent to ED and was admitted to [facility] where SBP was up to 200  - discussed importance of tight BP control   - pt reports improved compliance with BP meds and BP readings have improved  5. 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 Limited 2 sequence MRI head: Acute RIGHT periatrial subcentimeter  nonhemorrhagic infarct, in  region of optic tract.  - could be limiting vision--will check formal VF once macular edema improves  6. Ocular Hypertention OU  - today, IOP 15 OD  - patient reports not taking cosopt for several months--IOP ok OU  7. Mixed cataract OD - The symptoms of cataract, surgical options, and treatments and risks were discussed with patient. - discussed diagnosis and progression  8. Pseudophakia OS  - s/p CE/IOL (07.29.21, Dr. Kathlen Mody)  - IOL in good position, doing well - monitor   Ophthalmic Meds Ordered this visit:  Meds ordered this encounter  Medications   Bevacizumab (AVASTIN) SOLN 1.25 mg   Bevacizumab (AVASTIN) SOLN 1.25 mg     Return in about 4 weeks (around 07/12/2022) for f/u PDR Ou, DFE, OCT.  There are no Patient Instructions on file for this visit.   This document serves as a record of services personally performed by Gardiner Sleeper, MD, PhD. It was created on their behalf by Renaldo Reel, [place] an ophthalmic technician. The creation of this record is the provider's dictation and/or activities during the visit.    Electronically signed by:  Renaldo Reel, COT 12.20.23 10:01 PM   Gardiner Sleeper, M.D., Ph.D. Diseases & Surgery of the Retina and Vitreous [facility]  I have reviewed the above documentation for accuracy and completeness, and I agree with the above. Gardiner Sleeper, M.D., Ph.D. 06/17/22 10:02 PM   Abbreviations: M myopia (nearsighted); A astigmatism; H hyperopia (farsighted); P presbyopia; Mrx spectacle prescription;  CTL contact lenses; OD right eye; OS left eye; OU both eyes  XT exotropia; ET esotropia; PEK punctate epithelial keratitis; PEE punctate epithelial erosions; DES dry eye syndrome; MGD meibomian gland dysfunction; ATs artificial tears; PFAT's preservative free artificial tears; [place] nuclear sclerotic cataract; PSC posterior subcapsular cataract; ERM epi-retinal membrane; PVD posterior vitreous detachment; RD  retinal detachment; DM diabetes mellitus; DR diabetic retinopathy; NPDR non-proliferative diabetic retinopathy; PDR proliferative diabetic retinopathy; CSME clinically significant macular edema; DME diabetic macular edema; dbh dot blot hemorrhages; CWS cotton wool spot; POAG primary open angle glaucoma; C/D cup-to-disc ratio; HVF humphrey visual field; GVF goldmann visual field; OCT optical coherence tomography; IOP intraocular pressure; BRVO Branch retinal vein occlusion; CRVO central retinal vein occlusion; CRAO central retinal artery occlusion; BRAO branch retinal artery occlusion; RT retinal tear; SB scleral buckle; PPV pars plana vitrectomy; VH Vitreous hemorrhage; PRP panretinal laser photocoagulation; IVK intravitreal kenalog; VMT vitreomacular traction; MH Macular hole;  NVD neovascularization of the disc; NVE neovascularization elsewhere; AREDS age related eye disease study; ARMD age related macular degeneration; POAG primary open angle glaucoma; EBMD epithelial/anterior basement membrane dystrophy; ACIOL anterior chamber intraocular lens; IOL intraocular lens; PCIOL posterior chamber intraocular lens; Phaco/IOL phacoemulsification with intraocular lens placement; [place] photorefractive keratectomy; LASIK laser assisted in situ keratomileusis; HTN hypertension; DM diabetes mellitus; COPD chronic obstructive pulmonary disease

## 2022-06-14 ENCOUNTER — Encounter (HOSPITAL_COMMUNITY): Payer: Self-pay

## 2022-06-14 ENCOUNTER — Encounter (INDEPENDENT_AMBULATORY_CARE_PROVIDER_SITE_OTHER): Payer: Self-pay | Admitting: Ophthalmology

## 2022-06-14 ENCOUNTER — Ambulatory Visit (INDEPENDENT_AMBULATORY_CARE_PROVIDER_SITE_OTHER): Payer: Medicare Other | Admitting: Ophthalmology

## 2022-06-14 DIAGNOSIS — H40053 Ocular hypertension, bilateral: Secondary | ICD-10-CM

## 2022-06-14 DIAGNOSIS — H4313 Vitreous hemorrhage, bilateral: Secondary | ICD-10-CM

## 2022-06-14 DIAGNOSIS — E113513 Type 2 diabetes mellitus with proliferative diabetic retinopathy with macular edema, bilateral: Secondary | ICD-10-CM

## 2022-06-14 DIAGNOSIS — H25811 Combined forms of age-related cataract, right eye: Secondary | ICD-10-CM

## 2022-06-14 DIAGNOSIS — I639 Cerebral infarction, unspecified: Secondary | ICD-10-CM

## 2022-06-14 DIAGNOSIS — Z961 Presence of intraocular lens: Secondary | ICD-10-CM

## 2022-06-14 DIAGNOSIS — I10 Essential (primary) hypertension: Secondary | ICD-10-CM | POA: Diagnosis not present

## 2022-06-14 DIAGNOSIS — H35033 Hypertensive retinopathy, bilateral: Secondary | ICD-10-CM | POA: Diagnosis not present

## 2022-06-14 MED ORDER — BEVACIZUMAB CHEMO INJECTION 1.25MG/0.05ML SYRINGE FOR KALEIDOSCOPE
1.2500 mg | INTRAVITREAL | Status: AC | PRN
  Administered 2022-06-14: 1.25 mg via INTRAVITREAL

## 2022-06-28 NOTE — Progress Notes (Shared)
[facility] Note  07/12/2022     CHIEF COMPLAINT Patient presents for No chief complaint on file.   HISTORY OF PRESENT ILLNESS: Bill [name] is a 42 y.o. male who presents to the clinic today for:    Pt states vision is improving, he is using PF QID OD   Referring physician: Isaac Bliss, Rayford Halsted, MD [address] [place],  [place] [address]  HISTORICAL INFORMATION:   Selected notes from the MEDICAL RECORD NUMBER Referred by Dr. [name] for concern of severe NPDR / PDR OU   CURRENT MEDICATIONS: Current Outpatient Medications (Ophthalmic Drugs)  Medication Sig   prednisoLONE acetate (PRED FORTE) 1 % ophthalmic suspension Place 1 drop into the right eye 4 (four) times daily.   No current facility-administered medications for this visit. (Ophthalmic Drugs)   Current Outpatient Medications (Other)  Medication Sig   atorvastatin (LIPITOR) 10 MG tablet TAKE 1 TABLET BY MOUTH EVERY DAY   cholecalciferol (VITAMIN D-400) 10 MCG (400 UNIT) TABS tablet Take 400 Units by mouth daily.   Dulaglutide (TRULICITY) 1.5 GY/6.9SW SOPN Inject 1.5 mg into the skin once a week.   fluticasone (FLONASE) 50 MCG/ACT nasal spray Place 1 spray into both nostrils daily as needed for allergies or rhinitis.   Insulin Glargine Solostar (LANTUS) 100 UNIT/ML Solostar Pen Inject 6-12 Units into the skin every evening.   mycophenolate (MYFORTIC) 180 MG EC tablet Take 720 mg by mouth 2 (two) times daily.   predniSONE (DELTASONE) 5 MG tablet Take 5 mg by mouth daily with breakfast.   sertraline (ZOLOFT) 50 MG tablet Take 1 tablet (50 mg total) by mouth daily.   sulfamethoxazole-trimethoprim (BACTRIM) 400-80 MG tablet Take 1 tablet by mouth every Monday, Wednesday, and Friday.   tacrolimus (PROGRAF) 1 MG capsule Take 3-4 mg by mouth See admin instructions. 4 mg in the morning, 3 mg in the evening   No current facility-administered medications for this visit. (Other)    REVIEW OF SYSTEMS:   ALLERGIES Allergies  Allergen Reactions   Food     Melons-itchy/headaches   PAST MEDICAL HISTORY Past Medical History:  Diagnosis Date   Anemia    Anxiety    situational   Diabetes mellitus without complication ([place])    type 2   Diabetic retinopathy ([place])    PDR OU   ESRD on dialysis ([place])    living unrelated donor renal transplant 11/05/20   History of blood transfusion    Hypertension    PONV (postoperative nausea and vomiting)    03/22/2019- only 15 years ago   Renal disorder    Stroke [facility]) 2019   vision   Past Surgical History:  Procedure Laterality Date   A/V FISTULAGRAM N/A 03/11/2019   Procedure: A/V FISTULAGRAM - Left Arm;  Surgeon: Waynetta Sandy, MD;  Location: [facility];  Service: Cardiovascular;  Laterality: N/A;   arm surgery Right    Fracture   AV FISTULA PLACEMENT Left 01/25/2019   Procedure: ARTERIOVENOUS (AV) FISTULA CREATION  LEFT UPPER  ARM;  Surgeon: Serafina Mitchell, MD;  Location: [facility];  Service: Vascular;  Laterality: Left;   [place] Left 03/27/2019   Procedure: SECOND STAGE BASILIC VEIN TRANSPOSITION LEFT ARM;  Surgeon: Serafina Mitchell, MD;  Location: [place] [facility];  Service: Vascular;  Laterality: Left;   KIDNEY TRANSPLANT  11/05/2020   [place]   MEMBRANE PEEL Right 05/19/2022   Procedure: MEMBRANE  PEEL;  Surgeon: [name], MD;  Location: [facility];  Service: Ophthalmology;  Laterality: Right;   PARS PLANA VITRECTOMY Right 05/19/2022   Procedure: TWENTY FIVE GAUGE PARS PLANA VITRECTOMY;  Surgeon: [name], MD;  Location: [facility];  Service: Ophthalmology;  Laterality: Right;   PHOTOCOAGULATION WITH LASER Right 05/19/2022   Procedure: PHOTOCOAGULATION WITH LASER;  Surgeon: [name], MD;  Location: [facility];  Service: Ophthalmology;  Laterality: Right;   WISDOM TOOTH EXTRACTION     FAMILY HISTORY Family History  Problem Relation Age of Onset   Diabetes Mellitus II Mother     Hypertension Mother    Cataracts Mother    Diabetes Mellitus II Father    Stroke Father    Hypertension Father    Glaucoma Maternal Grandmother    Amblyopia Neg Hx    Blindness Neg Hx    Macular degeneration Neg Hx    Retinal detachment Neg Hx    Strabismus Neg Hx    Retinitis pigmentosa Neg Hx    SOCIAL HISTORY Social History   Tobacco Use   Smoking status: Never   Smokeless tobacco: Never  Vaping Use   Vaping Use: Never used  Substance Use Topics   Alcohol use: Not Currently   Drug use: Not Currently       OPHTHALMIC EXAM:  Not recorded    IMAGING AND PROCEDURES  Imaging and Procedures for @TODAY @          ASSESSMENT/PLAN: No diagnosis found.   1,2. Proliferative diabetic retinopathy OU  - last A1c was 11.9 on 05.11.23; 9.6 on 11.29.22; 7.8 on 7.20.22; 7.2 on 01.13.22  - s/p PRP OD (10.02.19), (01.22.20), (02.12.20), (03.08.22)  - s/p PRP OS (05/02/2018), (02.19.20), (2.17.22)  - s/p IVK #1 OD (01.22.20), OS (02.12.20)  - s/p IVA OD #1 (10.21.20), #2 (12.02.20), #3 (01.13.21), #4 (02.10.21), #5 (04.19.21), #6 (05.21.21), #7 (06.21.21), #8 (08.19.21), #9 (10.11.21), #10 (12.01.21), #11 (01.25.22), #12 (03.01.22), #13 (04.05.22), #14 (6.6.22), #15 (09.22.22), #16 (03.28.23), #17 (08.04.23), #18 (11.01.23 for [facility]), #19 (11.29.23), #20 (01.02.24)  - s/p IVA OS #1 (12.02.20), #2 (04.19.21), #3 (05.21.21), #4 (06.21.21), #5 (08.19.21), #6 (10.11.21), #7 (12.01.21), #8 (01.25.22), #9 (03.01.22), #10 (04.05.22), #11 (6.6.22), #12 (09.22.22), #13 (12.04.23), #14 (01.02.24)  - FA 8.19.19 shows leaking MA, patches of capillary nonperfusion OU  - repeat FA 10.11.21 shows persistent NV OU, large patches of vascular nonperfusion OU (OS>OD) -- fill in PRP completed OU (OS  2.17.22, OD 3.8.22)  - repeat FA 9.13.22 shows interval improvement in NV/leakage  - pre-VH BCVA OD: 20/40  - recommend IVA OU (OD #21 and OS #15) today, 01.30.24  - pt wishes to proceed with  injections  - RBA of procedure discussed, questions answered - informed consent obtained and signed - see procedure note  - s/p POW4 s/p PPV/MP OD, 12.07.2023             - doing well   - BCVA 20/40 OD -- improved             - IOP good at 15             - cont   PF 4x/day OD                          PSO ung QID OD -- decrease to QHS/PRN             - sleep with head elevated             -  eye shield when sleeping x1 more week             - post op drop and positioning instructions reviewed              - tylenol/ibuprofen for pain  - f/u 4 weeks, POV  3,4. 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 [name] in mid August 2019 with a 2 wk history intractable headaches and decreased vision OU  - found to have BP of 170s/90s -- sent to ED and was admitted to [facility] where SBP was up to 200  - discussed importance of tight BP control   - pt reports improved compliance with BP meds and BP readings have improved  5. 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 Limited 2 sequence MRI head: Acute RIGHT periatrial subcentimeter  nonhemorrhagic infarct, in region of optic tract.  - could be limiting vision--will check formal VF once macular edema improves  6. Ocular Hypertention OU  - today, IOP 15 OD  - patient reports not taking cosopt for several months--IOP ok OU  7. Mixed cataract OD - The symptoms of cataract, surgical options, and treatments and risks were discussed with patient. - discussed diagnosis and progression  8. Pseudophakia OS  - s/p CE/IOL (07.29.21, Dr. Kathlen Mody)  - IOL in good position, doing well - monitor   Ophthalmic Meds Ordered this visit:  No orders of the defined types were placed in this encounter.    No follow-ups on file.  There are no Patient Instructions on file for this visit.   This document serves as a record of services personally performed by Gardiner Sleeper, MD, PhD. It  was created on their behalf by Renaldo Reel, [place] an ophthalmic technician. The creation of this record is the provider's dictation and/or activities during the visit.    Electronically signed by:  Renaldo Reel, COT 1.16.24 2:25 PM   Gardiner Sleeper, M.D., Ph.D. Diseases & Surgery of the Retina and Vitreous [facility]: M myopia (nearsighted); A astigmatism; H hyperopia (farsighted); P presbyopia; Mrx spectacle prescription;  CTL contact lenses; OD right eye; OS left eye; OU both eyes  XT exotropia; ET esotropia; PEK punctate epithelial keratitis; PEE punctate epithelial erosions; DES dry eye syndrome; MGD meibomian gland dysfunction; ATs artificial tears; PFAT's preservative free artificial tears; [place] nuclear sclerotic cataract; PSC posterior subcapsular cataract; ERM epi-retinal membrane; PVD posterior vitreous detachment; RD retinal detachment; DM diabetes mellitus; DR diabetic retinopathy; NPDR non-proliferative diabetic retinopathy; PDR proliferative diabetic retinopathy; CSME clinically significant macular edema; DME diabetic macular edema; dbh dot blot hemorrhages; CWS cotton wool spot; POAG primary open angle glaucoma; C/D cup-to-disc ratio; HVF humphrey visual field; GVF goldmann visual field; OCT optical coherence tomography; IOP intraocular pressure; BRVO Branch retinal vein occlusion; CRVO central retinal vein occlusion; CRAO central retinal artery occlusion; BRAO branch retinal artery occlusion; RT retinal tear; SB scleral buckle; PPV pars plana vitrectomy; VH Vitreous hemorrhage; PRP panretinal laser photocoagulation; IVK intravitreal kenalog; VMT vitreomacular traction; MH Macular hole;  NVD neovascularization of the disc; NVE neovascularization elsewhere; AREDS age related eye disease study; ARMD age related macular degeneration; POAG primary open angle glaucoma; EBMD epithelial/anterior basement membrane dystrophy; ACIOL anterior  chamber intraocular lens; IOL intraocular lens; PCIOL posterior chamber intraocular lens; Phaco/IOL phacoemulsification with intraocular lens placement; PRK photorefractive keratectomy; LASIK laser assisted in situ keratomileusis; HTN hypertension;  DM diabetes mellitus; COPD chronic obstructive pulmonary disease

## 2022-07-12 ENCOUNTER — Encounter (INDEPENDENT_AMBULATORY_CARE_PROVIDER_SITE_OTHER): Payer: Medicare Other | Admitting: Ophthalmology

## 2022-07-12 ENCOUNTER — Encounter (INDEPENDENT_AMBULATORY_CARE_PROVIDER_SITE_OTHER): Payer: Self-pay

## 2022-07-12 DIAGNOSIS — Z961 Presence of intraocular lens: Secondary | ICD-10-CM

## 2022-07-12 DIAGNOSIS — H4313 Vitreous hemorrhage, bilateral: Secondary | ICD-10-CM

## 2022-07-12 DIAGNOSIS — I639 Cerebral infarction, unspecified: Secondary | ICD-10-CM

## 2022-07-12 DIAGNOSIS — H25811 Combined forms of age-related cataract, right eye: Secondary | ICD-10-CM

## 2022-07-12 DIAGNOSIS — H35033 Hypertensive retinopathy, bilateral: Secondary | ICD-10-CM

## 2022-07-12 DIAGNOSIS — H40053 Ocular hypertension, bilateral: Secondary | ICD-10-CM

## 2022-07-12 DIAGNOSIS — E113513 Type 2 diabetes mellitus with proliferative diabetic retinopathy with macular edema, bilateral: Secondary | ICD-10-CM

## 2022-07-12 DIAGNOSIS — I10 Essential (primary) hypertension: Secondary | ICD-10-CM

## 2022-07-19 NOTE — Progress Notes (Signed)
[facility] Note  07/25/2022     CHIEF COMPLAINT Patient presents for Retina Follow Up   HISTORY OF PRESENT ILLNESS: Bill [name] is a 42 y.o. male who presents to the clinic today for:  HPI     Retina Follow Up   Patient presents with  Diabetic Retinopathy.  In both eyes.  This started 4 weeks ago.  I, the attending physician,  performed the HPI with the patient and updated documentation appropriately.        Comments   Patient here for 4 weeks retina follow up for PDR OU. Patient states vision about the same. No eye pain. Sees regular eye doctor end of this month.       Last edited by [name], MD on 07/25/2022  5:19 PM.    Pt is delayed to follow up from 4 weeks to 6 weeks   Referring physician: Isaac Bliss, Rayford Halsted, MD [place],  [place] [address]  HISTORICAL INFORMATION:   Selected notes from the MEDICAL RECORD NUMBER Referred by Dr. [name] for concern of severe NPDR / PDR OU   CURRENT MEDICATIONS: Current Outpatient Medications (Ophthalmic Drugs)  Medication Sig   dorzolamide-timolol (COSOPT) 2-0.5 % ophthalmic solution Place 1 drop into the right eye 2 (two) times daily.   prednisoLONE acetate (PRED FORTE) 1 % ophthalmic suspension Place 1 drop into the right eye 4 (four) times daily.   No current facility-administered medications for this visit. (Ophthalmic Drugs)   Current Outpatient Medications (Other)  Medication Sig   atorvastatin (LIPITOR) 10 MG tablet TAKE 1 TABLET BY MOUTH EVERY DAY   cholecalciferol (VITAMIN D-400) 10 MCG (400 UNIT) TABS tablet Take 400 Units by mouth daily.   Dulaglutide (TRULICITY) 1.5 0000000 SOPN Inject 1.5 mg into the skin once a week.   fluticasone (FLONASE) 50 MCG/ACT nasal spray Place 1 spray into both nostrils daily as needed for allergies or rhinitis.   Insulin Glargine Solostar (LANTUS) 100 UNIT/ML Solostar Pen Inject 6-12 Units into the skin every evening.    mycophenolate (MYFORTIC) 180 MG EC tablet Take 720 mg by mouth 2 (two) times daily.   predniSONE (DELTASONE) 5 MG tablet Take 5 mg by mouth daily with breakfast.   sertraline (ZOLOFT) 50 MG tablet Take 1 tablet (50 mg total) by mouth daily.   sulfamethoxazole-trimethoprim (BACTRIM) 400-80 MG tablet Take 1 tablet by mouth every Monday, Wednesday, and Friday.   tacrolimus (PROGRAF) 1 MG capsule Take 3-4 mg by mouth See admin instructions. 4 mg in the morning, 3 mg in the evening   No current facility-administered medications for this visit. (Other)   REVIEW OF SYSTEMS: ROS   Positive for: Neurological, Genitourinary, Endocrine, Cardiovascular, Eyes, Psychiatric Negative for: Constitutional, Gastrointestinal, Skin, Musculoskeletal, HENT, Respiratory, Allergic/Imm, Heme/Lymph Last edited by [name], COA on 07/25/2022  3:07 PM.     ALLERGIES Allergies  Allergen Reactions   Food     Melons-itchy/headaches   PAST MEDICAL HISTORY Past Medical History:  Diagnosis Date   Anemia    Anxiety    situational   Diabetes mellitus without complication ([place])    type 2   Diabetic retinopathy ([place])    PDR OU   ESRD on dialysis ([place])    living unrelated donor renal transplant 11/05/20   History of blood transfusion    Hypertension    PONV (postoperative nausea and vomiting)    03/22/2019- only 15 years ago  Renal disorder    Stroke [facility]) 2019   vision   Past Surgical History:  Procedure Laterality Date   A/V FISTULAGRAM N/A 03/11/2019   Procedure: A/V FISTULAGRAM - Left Arm;  Surgeon: Waynetta Sandy, MD;  Location: [facility];  Service: Cardiovascular;  Laterality: N/A;   arm surgery Right    Fracture   AV FISTULA PLACEMENT Left 01/25/2019   Procedure: ARTERIOVENOUS (AV) FISTULA CREATION  LEFT UPPER  ARM;  Surgeon: Serafina Mitchell, MD;  Location: [facility];  Service: Vascular;  Laterality: Left;   [place]iger Left 03/27/2019   Procedure: SECOND  STAGE BASILIC VEIN TRANSPOSITION LEFT ARM;  Surgeon: Serafina Mitchell, MD;  Location: [facility];  Service: Vascular;  Laterality: Left;   KIDNEY TRANSPLANT  11/05/2020   [place]   MEMBRANE PEEL Right 05/19/2022   Procedure: MEMBRANE PEEL;  Surgeon: [name], MD;  Location: [facility];  Service: Ophthalmology;  Laterality: Right;   PARS PLANA VITRECTOMY Right 05/19/2022   Procedure: TWENTY FIVE GAUGE PARS PLANA VITRECTOMY;  Surgeon: [name], MD;  Location: [facility];  Service: Ophthalmology;  Laterality: Right;   PHOTOCOAGULATION WITH LASER Right 05/19/2022   Procedure: PHOTOCOAGULATION WITH LASER;  Surgeon: [name], MD;  Location: [facility] [facility];  Service: Ophthalmology;  Laterality: Right;   WISDOM TOOTH EXTRACTION     FAMILY HISTORY Family History  Problem Relation Age of Onset   Diabetes Mellitus II Mother    Hypertension Mother    Cataracts Mother    Diabetes Mellitus II Father    Stroke Father    Hypertension Father    Glaucoma Maternal Grandmother    Amblyopia Neg Hx    Blindness Neg Hx    Macular degeneration Neg Hx    Retinal detachment Neg Hx    Strabismus Neg Hx    Retinitis pigmentosa Neg Hx    SOCIAL HISTORY Social History   Tobacco Use   Smoking status: Never   Smokeless tobacco: Never  Vaping Use   Vaping Use: Never used  Substance Use Topics   Alcohol use: Not Currently   Drug use: Not Currently       OPHTHALMIC EXAM:  Base Eye Exam     Visual Acuity (Snellen - Linear)       Right Left   Dist cc 20/60 20/60 -2   Dist ph cc 20/40 -2 20/60 +2    Correction: Glasses         Tonometry (Tonopen, 3:02 PM)       Right Left   Pressure 31,30 15         Tonometry #2 (Tonopen, 3:49 PM)       Right Left   Pressure 36          Tonometry #3 (Tonopen, 3:50 PM)       Right Left   Pressure 36          Tonometry Comments   Brimonidine and Cosopt given in OD at 3:04 pm.        Neuro/Psych     Oriented x3: Yes   Mood/Affect:  Normal         Dilation     Both eyes: 1.0% Mydriacyl, 2.5% Phenylephrine @ 3:00 PM           Slit Lamp and Fundus Exam     Slit Lamp Exam       Right Left   Lids/Lashes Dermatochalasis - upper lid Normal   Conjunctiva/Sclera  mild melanosis, Subconjunctival hemorrhage -- improving, sutures dissolving White and quiet   Cornea Trace Punctate epithelial erosions, epi defect closed, tear film debris Trace Punctate epithelial erosions, well healed cataract wound   Anterior Chamber deep, 1-2+cell/pigment deep, 2-3+fine cell and pigment   Iris Round and dilated, No NVI Round and dilated, no NVI   Lens 2+ Nuclear sclerosis, 2+ Cortical cataract, 2+Posterior subcapsular cataract PC IOL in good position   Anterior Vitreous post vitrectomy, VH cleared Syneresis, blood stained vitreous condensations -- slightly improved, blood clots settled inferiorly         Fundus Exam       Right Left   Disc 3+Pallor, Sharp rim, fibrosis gone 3+ Pallor, NVD with fibrosis   C/D Ratio 0.1 0.2   Macula Flat, Good foveal reflex, scattered MA/DBH, fibrosis improved, cystic changes superior macula -- improved Flat, Blunted foveal reflex, ERM   Vessels Severe attenuation, Tortuous, +fibrosis - improved, +NV inferior to disc -- regressing, +sheathing Sclerotic arterioles, Tortuous, +NV -- regressing, fibrosis along superior arcades, severe Vascular attenuation   Periphery Attached, scattered DBH, good fill in PRP 360 extending to arcades, pre retinal heme settled inferiorly Attached, 360 PRP           Refraction     Wearing Rx       Sphere Cylinder Axis Add   Right -0.75 +1.00 164 +2.00   Left -0.25 +0.75 152 +2.00           IMAGING AND PROCEDURES  Imaging and Procedures for @TODAY$ @  OCT, Retina - OU - Both Eyes       Right Eye Quality was good. Central Foveal Thickness: 324. Progression has improved. Findings include no SRF, abnormal foveal contour, epiretinal membrane, intraretinal  fluid (Mild ERM with sharpening of foveal contour, mild interval improvement in cystic changes superior macula, interval improvement in vitreous opacities, stable improvement in tractional fibrosis).   Left Eye Quality was good. Central Foveal Thickness: 297. Progression has improved. Findings include no IRF, no SRF, abnormal foveal contour (Interval improvement in vitreous opacities).   Notes *Images captured and stored on drive  Diagnosis / Impression:  PDR w/ tractional fibrosis OU OD: Mild ERM with sharpening of foveal contour, mild interval improvement in cystic changes superior macula, interval improvement in vitreous opacities, stable improvement in tractional fibrosis OS: Interval improvement in vitreous opacities  Clinical management:  See below  Abbreviations: NFP - Normal foveal profile. CME - cystoid macular edema. PED - pigment epithelial detachment. IRF - intraretinal fluid. SRF - subretinal fluid. EZ - ellipsoid zone. ERM - epiretinal membrane. ORA - outer retinal atrophy. ORT - outer retinal tubulation. SRHM - subretinal hyper-reflective material       Intravitreal Injection, Pharmacologic Agent - OD - Right Eye       Time Out 07/25/2022. 4:10 PM. Confirmed correct patient, procedure, site, and patient consented.   Anesthesia Topical anesthesia was used. Anesthetic medications included Lidocaine 2%, Proparacaine 0.5%.   Procedure Preparation included 5% betadine to ocular surface, eyelid speculum. A supplied (32g) needle was used.   Injection: 1.25 mg Bevacizumab 1.52m/0.05ml   Route: Intravitreal, Site: Right Eye   NDC:[id number]:816987 Lot: [id number] , Expiration date: 08/26/2022   Post-op Post injection exam found visual acuity of at least counting fingers. The patient tolerated the procedure well. There were no complications. The patient received written and verbal post procedure care education. Post injection medications were not given.      Intravitreal  Injection, Pharmacologic Agent - OS -  Left Eye       Time Out 07/25/2022. 4:11 PM. Confirmed correct patient, procedure, site, and patient consented.   Anesthesia Topical anesthesia was used. Anesthetic medications included Lidocaine 2%, Proparacaine 0.5%.   Procedure Preparation included 5% betadine to ocular surface, eyelid speculum. A (33g) needle was used.   Injection: 1.25 mg Bevacizumab 1.31m/0.05ml   Route: Intravitreal, Site: Left Eye   NDC:IF:816987 Lot: [id number] Expiration date: 04/05/2023   Post-op Post injection exam found visual acuity of at least counting fingers. The patient tolerated the procedure well. There were no complications. The patient received written and verbal post procedure care education. Post injection medications were not given.             ASSESSMENT/PLAN:   ICD-10-CM   1. Proliferative diabetic retinopathy of both eyes with macular edema associated with type 2 diabetes mellitus (HCC)  E11.3513 OCT, Retina - OU - Both Eyes    Intravitreal Injection, Pharmacologic Agent - OD - Right Eye    Intravitreal Injection, Pharmacologic Agent - OS - Left Eye    Bevacizumab (AVASTIN) SOLN 1.25 mg    Bevacizumab (AVASTIN) SOLN 1.25 mg    2. Vitreous hemorrhage of both eyes ([place]  H43.13     3. Essential hypertension  I10     4. Hypertensive retinopathy of both eyes  H35.033     5. Acute CVA (cerebrovascular accident) ([place]  I63.9     6. Ocular hypertension, bilateral  H40.053     7. 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 Pseudophakia  Z96.1        1,2. Proliferative diabetic retinopathy OU  - delayed f/u -- 6 wks instead of 4  - last A1c was 10.5 on 12.21.23; 11.9 on 05.11.23; 9.6 on 11.29.22; 7.8 on 7.20.22; 7.2 on 01.13.22  - s/p PRP OD (10.02.19), (01.22.20), (02.12.20), (03.08.22)  - s/p PRP OS (05/02/2018), (02.19.20), (2.17.22)  - s/p IVK #1 OD (01.22.20), OS (02.12.20)  - s/p IVA OD #1 (10.21.20), #2  (12.02.20), #3 (01.13.21), #4 (02.10.21), #5 (04.19.21), #6 (05.21.21), #7 (06.21.21), #8 (08.19.21), #9 (10.11.21), #10 (12.01.21), #11 (01.25.22), #12 (03.01.22), #13 (04.05.22), #14 (6.6.22), #15 (09.22.22), #16 (03.28.23), #17 (08.04.23), #18 ([facility], #[facility] (11.29.23), #20 (01.02.23)  - s/p IVA OS #1 (12.02.20), #2 (04.19.21), #3 (05.21.21), #4 (06.21.21), #5 (08.19.21), #6 (10.11.21), #7 (12.01.21), #8 (01.25.22), #9 (03.01.22), #10 (04.05.22), #11 (6.6.22), #12 (09.22.22), #13 (12.04.23), #14 (01.02.23)  - FA 8.19.19 shows leaking MA, patches of capillary nonperfusion OU  - repeat FA 10.11.21 shows persistent NV OU, large patches of vascular nonperfusion OU (OS>OD) -- fill in PRP completed OU (OS  2.17.22, OD 3.8.22)  - repeat FA 9.13.22 shows interval improvement in NV/leakage  - pre-VH BCVA OD: 20/40  - s/p PPV/MP OD, 12.07.2023             - doing well   - BCVA 20/40 OD -- stable             - IOP elevated to 31+ -- ?steroid response             - dec PF to Qdaily for one week, then stop  - restart Cosopt BID OD             - post op drop instructions reviewed   - OCT shows OD: mild interval improvement in cystic changes superior macula, interval improvement in vitreous opacities, stable improvement in tractional fibrosis;  OS: Interval improvement in vitreous opacities  - recommend IVA OU (OD #21 and OS #15) today, 02.12.24  - pt wishes to proceed with injections  - RBA of procedure discussed, questions answered - informed consent obtained and signed - see procedure note - f/u 6 weeks -- DFE/OCT, possible injxns  3,4. 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 [name] in mid August 2019 with a 2 wk history intractable headaches and decreased vision OU  - found to have BP of 170s/90s -- sent to ED and was admitted to [facility] where SBP was up to 200  - discussed importance of tight BP control   - pt reports improved compliance with BP  meds and BP readings have improved  5. 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 Limited 2 sequence MRI head: Acute RIGHT periatrial subcentimeter  nonhemorrhagic infarct, in region of optic tract.  - could be limiting vision--will check formal VF once macular edema improves  6. Ocular Hypertention OU  - today, IOP 31,36 OD  - patient reports not taking cosopt for several months -- will restart Cosopt BID OS  7. Mixed cataract OD - The symptoms of cataract, surgical options, and treatments and risks were discussed with patient. - discussed diagnosis and progression  8. Pseudophakia OS  - s/p CE/IOL (07.29.21, Dr. Kathlen Mody)  - IOL in good position, doing well - monitor   Ophthalmic Meds Ordered this visit:  Meds ordered this encounter  Medications   dorzolamide-timolol (COSOPT) 2-0.5 % ophthalmic solution    Sig: Place 1 drop into the right eye 2 (two) times daily.    Dispense:  10 mL    Refill:  1   Bevacizumab (AVASTIN) SOLN 1.25 mg   Bevacizumab (AVASTIN) SOLN 1.25 mg     Return in about 6 weeks (around 09/05/2022) for f/u PDR OU, DFE, OCT.  There are no Patient Instructions on file for this visit.   This document serves as a record of services personally performed by Gardiner Sleeper, MD, PhD. It was created on their behalf by Orvan Falconer, an ophthalmic technician. The creation of this record is the provider's dictation and/or activities during the visit.    Electronically signed by: Orvan Falconer, OA, 07/26/22  1:16 AM  This document serves as a record of services personally performed by Gardiner Sleeper, MD, PhD. It was created on their behalf by San Jetty. Owens Shark, OA an ophthalmic technician. The creation of this record is the provider's dictation and/or activities during the visit.    Electronically signed by: San Jetty. Owens Shark, OA 02.12.2024  1:16 AM   Gardiner Sleeper, M.D., Ph.D. Diseases & Surgery of the Retina and  Vitreous [facility]  I have reviewed the above documentation for accuracy and completeness, and I agree with the above. Gardiner Sleeper, M.D., Ph.D. 07/26/22 1:21 AM   Abbreviations: M myopia (nearsighted); A astigmatism; H hyperopia (farsighted); P presbyopia; Mrx spectacle prescription;  CTL contact lenses; OD right eye; OS left eye; OU both eyes  XT exotropia; ET esotropia; PEK punctate epithelial keratitis; PEE punctate epithelial erosions; DES dry eye syndrome; MGD meibomian gland dysfunction; ATs artificial tears; PFAT's preservative free artificial tears; [place] nuclear sclerotic cataract; PSC posterior subcapsular cataract; ERM epi-retinal membrane; PVD posterior vitreous detachment; RD retinal detachment; DM diabetes mellitus; DR diabetic retinopathy; NPDR non-proliferative diabetic retinopathy; PDR proliferative diabetic retinopathy; CSME clinically significant macular  edema; DME diabetic macular edema; dbh dot blot hemorrhages; CWS cotton wool spot; POAG primary open angle glaucoma; C/D cup-to-disc ratio; HVF humphrey visual field; GVF goldmann visual field; OCT optical coherence tomography; IOP intraocular pressure; BRVO Branch retinal vein occlusion; CRVO central retinal vein occlusion; CRAO central retinal artery occlusion; BRAO branch retinal artery occlusion; RT retinal tear; SB scleral buckle; PPV pars plana vitrectomy; VH Vitreous hemorrhage; PRP panretinal laser photocoagulation; IVK intravitreal kenalog; VMT vitreomacular traction; MH Macular hole;  NVD neovascularization of the disc; NVE neovascularization elsewhere; AREDS age related eye disease study; ARMD age related macular degeneration; POAG primary open angle glaucoma; EBMD epithelial/anterior basement membrane dystrophy; ACIOL anterior chamber intraocular lens; IOL intraocular lens; PCIOL posterior chamber intraocular lens; Phaco/IOL phacoemulsification with intraocular lens placement; [place] photorefractive  keratectomy; LASIK laser assisted in situ keratomileusis; HTN hypertension; DM diabetes mellitus; COPD chronic obstructive pulmonary disease

## 2022-07-25 ENCOUNTER — Encounter (INDEPENDENT_AMBULATORY_CARE_PROVIDER_SITE_OTHER): Payer: Self-pay | Admitting: Ophthalmology

## 2022-07-25 ENCOUNTER — Ambulatory Visit (INDEPENDENT_AMBULATORY_CARE_PROVIDER_SITE_OTHER): Payer: Medicare Other | Admitting: Ophthalmology

## 2022-07-25 DIAGNOSIS — E113513 Type 2 diabetes mellitus with proliferative diabetic retinopathy with macular edema, bilateral: Secondary | ICD-10-CM | POA: Diagnosis not present

## 2022-07-25 DIAGNOSIS — Z961 Presence of intraocular lens: Secondary | ICD-10-CM

## 2022-07-25 DIAGNOSIS — H35033 Hypertensive retinopathy, bilateral: Secondary | ICD-10-CM | POA: Diagnosis not present

## 2022-07-25 DIAGNOSIS — H4313 Vitreous hemorrhage, bilateral: Secondary | ICD-10-CM | POA: Diagnosis not present

## 2022-07-25 DIAGNOSIS — I10 Essential (primary) hypertension: Secondary | ICD-10-CM | POA: Diagnosis not present

## 2022-07-25 DIAGNOSIS — H25811 Combined forms of age-related cataract, right eye: Secondary | ICD-10-CM

## 2022-07-25 DIAGNOSIS — H40053 Ocular hypertension, bilateral: Secondary | ICD-10-CM

## 2022-07-25 DIAGNOSIS — I639 Cerebral infarction, unspecified: Secondary | ICD-10-CM

## 2022-07-25 MED ORDER — BEVACIZUMAB CHEMO INJECTION 1.25MG/0.05ML SYRINGE FOR KALEIDOSCOPE
1.2500 mg | INTRAVITREAL | Status: AC | PRN
  Administered 2022-07-25: 1.25 mg via INTRAVITREAL

## 2022-07-25 MED ORDER — DORZOLAMIDE HCL-TIMOLOL MAL 2-0.5 % OP SOLN: 1.0000 [drp] | Freq: Two times a day (BID) | OPHTHALMIC | 1 refills | Status: DC

## 2022-08-13 ENCOUNTER — Encounter (HOSPITAL_COMMUNITY): Payer: Self-pay

## 2022-08-16 ENCOUNTER — Encounter (HOSPITAL_COMMUNITY): Payer: Self-pay

## 2022-08-23 ENCOUNTER — Ambulatory Visit: Payer: Medicare Other | Admitting: Internal Medicine

## 2022-08-23 DIAGNOSIS — E1159 Type 2 diabetes mellitus with other circulatory complications: Secondary | ICD-10-CM

## 2022-09-05 ENCOUNTER — Encounter (INDEPENDENT_AMBULATORY_CARE_PROVIDER_SITE_OTHER): Payer: Medicare Other | Admitting: Ophthalmology

## 2022-09-05 DIAGNOSIS — H40053 Ocular hypertension, bilateral: Secondary | ICD-10-CM

## 2022-09-05 DIAGNOSIS — H25811 Combined forms of age-related cataract, right eye: Secondary | ICD-10-CM

## 2022-09-05 DIAGNOSIS — Z961 Presence of intraocular lens: Secondary | ICD-10-CM

## 2022-09-05 DIAGNOSIS — I639 Cerebral infarction, unspecified: Secondary | ICD-10-CM

## 2022-09-05 DIAGNOSIS — I10 Essential (primary) hypertension: Secondary | ICD-10-CM

## 2022-09-05 DIAGNOSIS — E113513 Type 2 diabetes mellitus with proliferative diabetic retinopathy with macular edema, bilateral: Secondary | ICD-10-CM

## 2022-09-05 DIAGNOSIS — H35033 Hypertensive retinopathy, bilateral: Secondary | ICD-10-CM

## 2022-09-05 DIAGNOSIS — H4313 Vitreous hemorrhage, bilateral: Secondary | ICD-10-CM

## 2022-09-13 NOTE — Progress Notes (Signed)
[facility] - Clinic Note  09/20/2022     CHIEF COMPLAINT Patient presents for Retina Follow Up   HISTORY OF PRESENT ILLNESS: Bill [name] is a 42 y.o. male who presents to the clinic today for:  HPI     Retina Follow Up   Patient presents with  Diabetic Retinopathy.  In both eyes.  This started 6 weeks ago.  I, the attending physician,  performed the HPI with the patient and updated documentation appropriately.        Comments   Patient here for 6 weeks retina follow up for PDR OU. Patient states vision about the same. No eye pain. Uses pressure eye drops.      Last edited by Rennis Chris, MD on 09/20/2022  5:18 PM.    Pt is still using PF BID OD, he is also using Cosopt BID OD  Referring physician: Philip Aspen, Limmie Patricia, MD [address] [place],  [place] [address]  HISTORICAL INFORMATION:   Selected notes from the MEDICAL RECORD NUMBER Referred by Dr. Jorje Guild for concern of severe NPDR / PDR OU   CURRENT MEDICATIONS: Current Outpatient Medications (Ophthalmic Drugs)  Medication Sig   brimonidine (ALPHAGAN) 0.2 % ophthalmic solution Place 1 drop into the right eye 2 (two) times daily.   dorzolamide-timolol (COSOPT) 2-0.5 % ophthalmic solution Place 1 drop into the right eye 2 (two) times daily.   prednisoLONE acetate (PRED FORTE) 1 % ophthalmic suspension Place 1 drop into the right eye 4 (four) times daily.   No current facility-administered medications for this visit. (Ophthalmic Drugs)   Current Outpatient Medications (Other)  Medication Sig   atorvastatin (LIPITOR) 10 MG tablet TAKE 1 TABLET BY MOUTH EVERY DAY   cholecalciferol (VITAMIN D-400) 10 MCG (400 UNIT) TABS tablet Take 400 Units by mouth daily.   Dulaglutide (TRULICITY) 1.5 MG/0.5ML SOPN Inject 1.5 mg into the skin once a week.   fluticasone (FLONASE) 50 MCG/ACT nasal spray Place 1 spray into both nostrils daily as needed for allergies or rhinitis.   Insulin  Glargine Solostar (LANTUS) 100 UNIT/ML Solostar Pen Inject 6-12 Units into the skin every evening.   mycophenolate (MYFORTIC) 180 MG EC tablet Take 720 mg by mouth 2 (two) times daily.   predniSONE (DELTASONE) 5 MG tablet Take 5 mg by mouth daily with breakfast.   sertraline (ZOLOFT) 50 MG tablet Take 1 tablet (50 mg total) by mouth daily.   sulfamethoxazole-trimethoprim (BACTRIM) 400-80 MG tablet Take 1 tablet by mouth every Monday, Wednesday, and Friday.   tacrolimus (PROGRAF) 1 MG capsule Take 3-4 mg by mouth See admin instructions. 4 mg in the morning, 3 mg in the evening   No current facility-administered medications for this visit. (Other)   REVIEW OF SYSTEMS: ROS   Positive for: Neurological, Genitourinary, Endocrine, Cardiovascular, Eyes, Psychiatric Negative for: Constitutional, Gastrointestinal, Skin, Musculoskeletal, HENT, Respiratory, Allergic/Imm, Heme/Lymph Last edited by Laddie Aquas, COA on 09/20/2022  9:15 AM.      ALLERGIES Allergies  Allergen Reactions   Food     Melons-itchy/headaches   PAST MEDICAL HISTORY Past Medical History:  Diagnosis Date   Anemia    Anxiety    situational   Diabetes mellitus without complication    type 2   Diabetic retinopathy    PDR OU   ESRD on dialysis    living unrelated donor renal transplant 11/05/20   History of blood transfusion    Hypertension    PONV (  postoperative nausea and vomiting)    03/22/2019- only 15 years ago   Renal disorder    Stroke 2019   vision   Past Surgical History:  Procedure Laterality Date   A/V FISTULAGRAM N/A 03/11/2019   Procedure: A/V FISTULAGRAM - Left Arm;  Surgeon: Maeola Harman, MD;  Location[facility] INVASIVE CV LAB;  Service: Cardiovascular;  Laterality: N/A;   arm surgery Right    Fracture   AV FISTULA PLACEMENT Left 01/25/2019   Procedure: ARTERIOVENOUS (AV) FISTULA CREATION  LEFT UPPER  ARM;  Surgeon: Nada Libman, MD;  Location: [facility] OR;  Service: Vascular;  Laterality:  Left;   BASCILIC VEIN TRANSPOSITION Left 03/27/2019   Procedure: SECOND STAGE BASILIC VEIN TRANSPOSITION LEFT ARM;  Surgeon: Nada Libman, MD;  Location: [facility] OR;  Service: Vascular;  Laterality: Left;   KIDNEY TRANSPLANT  11/05/2020   [facility]   MEMBRANE PEEL Right 05/19/2022   Procedure: MEMBRANE PEEL;  Surgeon: Rennis Chris, MD;  Location: [facility] OR;  Service: Ophthalmology;  Laterality: Right;   PARS PLANA VITRECTOMY Right 05/19/2022   Procedure: TWENTY FIVE GAUGE PARS PLANA VITRECTOMY;  Surgeon: Rennis Chris, MD;  Location: [facility] OR;  Service: Ophthalmology;  Laterality: Right;   PHOTOCOAGULATION WITH LASER Right 05/19/2022   Procedure: PHOTOCOAGULATION WITH LASER;  Surgeon: Rennis Chris, MD;  Location: [facility] OR;  Service: Ophthalmology;  Laterality: Right;   WISDOM TOOTH EXTRACTION     FAMILY HISTORY Family History  Problem Relation Age of Onset   Diabetes Mellitus II Mother    Hypertension Mother    Cataracts Mother    Diabetes Mellitus II Father    Stroke Father    Hypertension Father    Glaucoma Maternal Grandmother    Amblyopia Neg Hx    Blindness Neg Hx    Macular degeneration Neg Hx    Retinal detachment Neg Hx    Strabismus Neg Hx    Retinitis pigmentosa Neg Hx    SOCIAL HISTORY Social History   Tobacco Use   Smoking status: Never   Smokeless tobacco: Never  Vaping Use   Vaping Use: Never used  Substance Use Topics   Alcohol use: Not Currently   Drug use: Not Currently       OPHTHALMIC EXAM:  Base Eye Exam     Visual Acuity (Snellen - Linear)       Right Left   Dist cc 20/50 -2 20/40   Dist ph cc 20/40 NI    Correction: Glasses         Tonometry (Tonopen, 9:09 AM)       Right Left   Pressure 33,31 20  Gave Brimonidine and Cosopt @ 9:09 am.        Pupils       Dark Light Shape React APD   Right 4 4 Round NR None   Left 4 3 Round Brisk +1         Visual Fields (Counting fingers)       Left Right   Restrictions Total  superior temporal, superior nasal deficiencies; Partial outer inferior temporal, inferior nasal deficiencies Partial outer inferior temporal deficiency; Partial inner inferior nasal deficiency         Extraocular Movement       Right Left    Full, Ortho Full, Ortho         Neuro/Psych     Oriented x3: Yes   Mood/Affect: Normal         Dilation  Both eyes: 1.0% Mydriacyl, 2.5% Phenylephrine @ 9:09 AM           Slit Lamp and Fundus Exam     Slit Lamp Exam       Right Left   Lids/Lashes Dermatochalasis - upper lid Normal   Conjunctiva/Sclera mild melanosis, Subconjunctival hemorrhage -- improving, sutures dissolving White and quiet   Cornea Trace Punctate epithelial erosions, epi defect closed, tear film debris Trace Punctate epithelial erosions, well healed cataract wound   Anterior Chamber deep, 1-2+cell/pigment deep, 2-3+fine cell and pigment   Iris Round and dilated, No NVI Round and dilated, no NVI   Lens 2+ Nuclear sclerosis, 2+ Cortical cataract, 2+Posterior subcapsular cataract PC IOL in good position   Anterior Vitreous post vitrectomy, VH cleared Syneresis, blood stained vitreous condensations -- slightly improved, blood clots settled inferiorly         Fundus Exam       Right Left   Disc 3+Pallor, Sharp rim, fibrosis gone 3+ Pallor, NVD with fibrosis   C/D Ratio 0.1 0.2   Macula Flat, Good foveal reflex, scattered MA/DBH, fibrosis improved, cystic changes superior macula -- improved Flat, Blunted foveal reflex, ERM   Vessels Severe attenuation, Tortuous, +fibrosis - improved, +NV inferior to disc -- regressing, +sheathing Sclerotic arterioles, Tortuous, +NV -- regressing, fibrosis along superior arcades, severe Vascular attenuation   Periphery Attached, scattered DBH, good fill in PRP 360 extending to arcades, pre retinal heme settled inferiorly Attached, 360 PRP           Refraction     Wearing Rx       Sphere Cylinder Axis Add   Right  -0.75 +1.00 164 +2.00   Left -0.25 +0.75 152 +2.00           IMAGING AND PROCEDURES  Imaging and Procedures for @TODAY @  OCT, Retina - OU - Both Eyes       Right Eye Quality was good. Central Foveal Thickness: 331. Progression has improved. Findings include no SRF, abnormal foveal contour, epiretinal membrane, intraretinal fluid (Mild ERM with sharpening of foveal contour, persistent cystic changes superior macula, stable improvement in vitreous opacities and tractional fibrosis).   Left Eye Quality was good. Central Foveal Thickness: 292. Progression has improved. Findings include no IRF, no SRF, abnormal foveal contour (Interval improvement in vitreous opacities).   Notes *Images captured and stored on drive  Diagnosis / Impression:  PDR w/ tractional fibrosis OU OD: Mild ERM with sharpening of foveal contour, persistent cystic changes superior macula, stable improvement in vitreous opacities and tractional fibrosis OS: Interval improvement in vitreous opacities  Clinical management:  See below  Abbreviations: NFP - Normal foveal profile. CME - cystoid macular edema. PED - pigment epithelial detachment. IRF - intraretinal fluid. SRF - subretinal fluid. EZ - ellipsoid zone. ERM - epiretinal membrane. ORA - outer retinal atrophy. ORT - outer retinal tubulation. SRHM - subretinal hyper-reflective material       Intravitreal Injection, Pharmacologic Agent - OD - Right Eye       Time Out 09/20/2022. 9:39 AM. Confirmed correct patient, procedure, site, and patient consented.   Anesthesia Topical anesthesia was used. Anesthetic medications included Lidocaine 2%, Proparacaine 0.5%.   Procedure Preparation included 5% betadine to ocular surface, eyelid speculum. A supplied (32g) needle was used.   Injection: 1.25 mg Bevacizumab 1.25mg /0.30ml   Route: Intravitreal, Site: Right Eye   NDC: P3213405, Lot: [id number], Expiration date: 10/09/2022   Post-op Post injection exam  found visual acuity of at  least counting fingers. The patient tolerated the procedure well. There were no complications. The patient received written and verbal post procedure care education. Post injection medications were not given.      Intravitreal Injection, Pharmacologic Agent - OS - Left Eye       Time Out 09/20/2022. 9:39 AM. Confirmed correct patient, procedure, site, and patient consented.   Anesthesia Topical anesthesia was used. Anesthetic medications included Lidocaine 2%, Proparacaine 0.5%.   Procedure Preparation included 5% betadine to ocular surface, eyelid speculum. A (33g) needle was used.   Injection: 1.25 mg Bevacizumab 1.25mg /0.12ml   Route: Intravitreal, Site: Left Eye   NDC: P3213405, Lot: [id number], Expiration date: 12/15/2022   Post-op Post injection exam found visual acuity of at least counting fingers. The patient tolerated the procedure well. There were no complications. The patient received written and verbal post procedure care education. Post injection medications were not given.            ASSESSMENT/PLAN:   ICD-10-CM   1. Proliferative diabetic retinopathy of both eyes with macular edema associated with type 2 diabetes mellitus  E11.3513 OCT, Retina - OU - Both Eyes    Intravitreal Injection, Pharmacologic Agent - OD - Right Eye    Intravitreal Injection, Pharmacologic Agent - OS - Left Eye    Bevacizumab (AVASTIN) SOLN 1.25 mg    Bevacizumab (AVASTIN) SOLN 1.25 mg    2. Vitreous hemorrhage of both eyes  H43.13     3. Essential hypertension  I10     4. Hypertensive retinopathy of both eyes  H35.033     5. Acute CVA (cerebrovascular accident)  I63.9     6. Ocular hypertension, bilateral  H40.053     7. 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 Pseudophakia  Z96.1      1,2. Proliferative diabetic retinopathy OU  - delayed f/u -- 8 wks instead of 6  - last A1c was 10.5 on 12.21.23; 11.9 on 05.11.23;  9.6 on 11.29.22; 7.8 on 7.20.22; 7.2 on 01.13.22  - s/p PRP OD (10.02.19), (01.22.20), (02.12.20), (03.08.22)  - s/p PRP OS (05/02/2018), (02.19.20), (2.17.22)  - s/p IVK #1 OD (01.22.20), OS (02.12.20)  - s/p IVA OD #1 (10.21.20), #2 (12.02.20), #3 (01.13.21), #4 (02.10.21), #5 (04.19.21), #6 (05.21.21), #7 (06.21.21), #8 (08.19.21), #9 (10.11.21), #10 (12.01.21), #11 (01.25.22), #12 (03.01.22), #13 (04.05.22), #14 (6.6.22), #15 (09.22.22), #16 (03.28.23), #17 (08.04.23), #18 (11.01.23 for [facility]), #19 (11.29.23), #20 (01.02.23), #21 (02.12.24)  - s/p IVA OS #1 (12.02.20), #2 (04.19.21), #3 (05.21.21), #4 (06.21.21), #5 (08.19.21), #6 (10.11.21), #7 (12.01.21), #8 (01.25.22), #9 (03.01.22), #10 (04.05.22), #11 (6.6.22), #12 (09.22.22), #13 (12.04.23), #14 (01.02.23), #15 (02.12.24)  - FA 8.19.19 shows leaking MA, patches of capillary nonperfusion OU  - repeat FA 10.11.21 shows persistent NV OU, large patches of vascular nonperfusion OU (OS>OD) -- fill in PRP completed OU (OS  2.17.22, OD 3.8.22)  - repeat FA 9.13.22 shows interval improvement in NV/leakage  - pre-VH BCVA OD: 20/40  - s/p PPV/MP OD, 12.07.2023             - doing well   - BCVA 20/40 OD -- stable             - IOP elevated to 31+ -- ?steroid response -- pt is still using PF BID OD (didn't taper_             - dec PF to Qdaily for one week, then  stop  - cont Cosopt BID OD  - start Brim BID OD             - post op drop instructions reviewed   - OCT shows Mild ERM with sharpening of foveal contour, persistent cystic changes superior macula, stable improvement in vitreous opacities and tractional fibrosis  - recommend IVA OU (OD #22 and OS #16) today, 04.09.24  - pt wishes to proceed with injections  - RBA of procedure discussed, questions answered - informed consent obtained and signed - see procedure note - f/u 6 weeks -- DFE/OCT, possible injxns  3,4. 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  Jorje Guild in mid August 2019 with a 2 wk history intractable headaches and decreased vision OU  - found to have BP of 170s/90s -- sent to ED and was admitted to [facility] where SBP was up to 200  - discussed importance of tight BP control   - pt reports improved compliance with BP meds and BP readings have improved  5. 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 Limited 2 sequence MRI head: Acute RIGHT periatrial subcentimeter  nonhemorrhagic infarct, in region of optic tract.  - could be limiting vision--will check formal VF once macular edema improves  6. Ocular Hypertention OU  - today, IOP 33,31 OD  - cont Cosopt BID OS  7. Mixed cataract OD - The symptoms of cataract, surgical options, and treatments and risks were discussed with patient. - discussed diagnosis and progression - continue to monitor  8. Pseudophakia OS  - s/p CE/IOL (07.29.21, Dr. Alben Spittle)  - IOL in good position, doing well - continue to monitor   Ophthalmic Meds Ordered this visit:  Meds ordered this encounter  Medications   brimonidine (ALPHAGAN) 0.2 % ophthalmic solution    Sig: Place 1 drop into the right eye 2 (two) times daily.    Dispense:  3 mL    Refill:  11   Bevacizumab (AVASTIN) SOLN 1.25 mg   Bevacizumab (AVASTIN) SOLN 1.25 mg     Return in about 6 weeks (around 11/01/2022) for f/u PDR OU, DFE, OCT.  There are no Patient Instructions on file for this visit.   This document serves as a record of services personally performed by Karie Chimera, MD, PhD. It was created on their behalf by Gerilyn Nestle, COT an ophthalmic technician. The creation of this record is the provider's dictation and/or activities during the visit.    Electronically signed by:  Gerilyn Nestle, COT  04.02.24 5:21 PM  This document serves as a record of services personally performed by Karie Chimera, MD, PhD. It was created on their behalf by Glee Arvin. Manson Passey, OA an  ophthalmic technician. The creation of this record is the provider's dictation and/or activities during the visit.    Electronically signed by: Glee Arvin. Manson Passey, [place] 04.09.2024 5:21 PM   Karie Chimera, M.D., Ph.D. Diseases & Surgery of the Retina and Vitreous [facility]  I have reviewed the above documentation for accuracy and completeness, and I agree with the above. Karie Chimera, M.D., Ph.D. 09/20/22 5:23 PM  Abbreviations: M myopia (nearsighted); A astigmatism; H hyperopia (farsighted); P presbyopia; Mrx spectacle prescription;  CTL contact lenses; OD right eye; OS left eye; OU both eyes  XT exotropia; ET esotropia; PEK punctate epithelial keratitis; PEE punctate epithelial erosions; DES dry eye syndrome; MGD meibomian  gland dysfunction; ATs artificial tears; PFAT's preservative free artificial tears; NSC nuclear sclerotic cataract; PSC posterior subcapsular cataract; ERM epi-retinal membrane; PVD posterior vitreous detachment; RD retinal detachment; DM diabetes mellitus; DR diabetic retinopathy; NPDR non-proliferative diabetic retinopathy; PDR proliferative diabetic retinopathy; CSME clinically significant macular edema; DME diabetic macular edema; dbh dot blot hemorrhages; CWS cotton wool spot; POAG primary open angle glaucoma; C/D cup-to-disc ratio; HVF humphrey visual field; GVF goldmann visual field; OCT optical coherence tomography; IOP intraocular pressure; BRVO Branch retinal vein occlusion; CRVO central retinal vein occlusion; CRAO central retinal artery occlusion; BRAO branch retinal artery occlusion; RT retinal tear; SB scleral buckle; PPV pars plana vitrectomy; VH Vitreous hemorrhage; PRP panretinal laser photocoagulation; IVK intravitreal kenalog; VMT vitreomacular traction; MH Macular hole;  NVD neovascularization of the disc; NVE neovascularization elsewhere; AREDS age related eye disease study; ARMD age related macular degeneration; POAG primary open angle  glaucoma; EBMD epithelial/anterior basement membrane dystrophy; ACIOL anterior chamber intraocular lens; IOL intraocular lens; PCIOL posterior chamber intraocular lens; Phaco/IOL phacoemulsification with intraocular lens placement; PRK photorefractive keratectomy; LASIK laser assisted in situ keratomileusis; HTN hypertension; DM diabetes mellitus; COPD chronic obstructive pulmonary disease

## 2022-09-14 ENCOUNTER — Encounter (INDEPENDENT_AMBULATORY_CARE_PROVIDER_SITE_OTHER): Payer: Medicare Other | Admitting: Ophthalmology

## 2022-09-14 DIAGNOSIS — I639 Cerebral infarction, unspecified: Secondary | ICD-10-CM

## 2022-09-14 DIAGNOSIS — H4313 Vitreous hemorrhage, bilateral: Secondary | ICD-10-CM

## 2022-09-14 DIAGNOSIS — I10 Essential (primary) hypertension: Secondary | ICD-10-CM

## 2022-09-14 DIAGNOSIS — H40053 Ocular hypertension, bilateral: Secondary | ICD-10-CM

## 2022-09-14 DIAGNOSIS — Z961 Presence of intraocular lens: Secondary | ICD-10-CM

## 2022-09-14 DIAGNOSIS — H35033 Hypertensive retinopathy, bilateral: Secondary | ICD-10-CM

## 2022-09-14 DIAGNOSIS — H25811 Combined forms of age-related cataract, right eye: Secondary | ICD-10-CM

## 2022-09-14 DIAGNOSIS — E113513 Type 2 diabetes mellitus with proliferative diabetic retinopathy with macular edema, bilateral: Secondary | ICD-10-CM

## 2022-09-20 ENCOUNTER — Ambulatory Visit (INDEPENDENT_AMBULATORY_CARE_PROVIDER_SITE_OTHER): Payer: Medicare Other | Admitting: Ophthalmology

## 2022-09-20 ENCOUNTER — Encounter (INDEPENDENT_AMBULATORY_CARE_PROVIDER_SITE_OTHER): Payer: Self-pay | Admitting: Ophthalmology

## 2022-09-20 DIAGNOSIS — Z961 Presence of intraocular lens: Secondary | ICD-10-CM | POA: Diagnosis not present

## 2022-09-20 DIAGNOSIS — H25811 Combined forms of age-related cataract, right eye: Secondary | ICD-10-CM | POA: Diagnosis not present

## 2022-09-20 DIAGNOSIS — H35033 Hypertensive retinopathy, bilateral: Secondary | ICD-10-CM

## 2022-09-20 DIAGNOSIS — E113513 Type 2 diabetes mellitus with proliferative diabetic retinopathy with macular edema, bilateral: Secondary | ICD-10-CM | POA: Diagnosis not present

## 2022-09-20 DIAGNOSIS — H40053 Ocular hypertension, bilateral: Secondary | ICD-10-CM

## 2022-09-20 DIAGNOSIS — I639 Cerebral infarction, unspecified: Secondary | ICD-10-CM

## 2022-09-20 DIAGNOSIS — H4313 Vitreous hemorrhage, bilateral: Secondary | ICD-10-CM

## 2022-09-20 DIAGNOSIS — I10 Essential (primary) hypertension: Secondary | ICD-10-CM

## 2022-09-20 MED ORDER — BEVACIZUMAB CHEMO INJECTION 1.25MG/0.05ML SYRINGE FOR KALEIDOSCOPE
1.2500 mg | INTRAVITREAL | Status: AC | PRN
Start: 2022-09-20 — End: 2022-09-20
  Administered 2022-09-20: 1.25 mg via INTRAVITREAL

## 2022-09-20 MED ORDER — BRIMONIDINE TARTRATE 0.2 % OP SOLN: 1.0000 [drp] | Freq: Two times a day (BID) | OPHTHALMIC | 11 refills | Status: AC

## 2022-09-22 DIAGNOSIS — Z79899 Other long term (current) drug therapy: Secondary | ICD-10-CM | POA: Diagnosis not present

## 2022-09-22 DIAGNOSIS — N1831 Chronic kidney disease, stage 3a: Secondary | ICD-10-CM | POA: Diagnosis not present

## 2022-09-22 DIAGNOSIS — I129 Hypertensive chronic kidney disease with stage 1 through stage 4 chronic kidney disease, or unspecified chronic kidney disease: Secondary | ICD-10-CM | POA: Diagnosis not present

## 2022-09-22 DIAGNOSIS — Z940 Kidney transplant status: Secondary | ICD-10-CM | POA: Diagnosis not present

## 2022-09-22 DIAGNOSIS — D631 Anemia in chronic kidney disease: Secondary | ICD-10-CM | POA: Diagnosis not present

## 2022-09-22 DIAGNOSIS — E1122 Type 2 diabetes mellitus with diabetic chronic kidney disease: Secondary | ICD-10-CM | POA: Diagnosis not present

## 2022-09-22 DIAGNOSIS — E872 Acidosis, unspecified: Secondary | ICD-10-CM | POA: Diagnosis not present

## 2022-09-24 LAB — LAB REPORT - SCANNED
Creatinine, POC: 101.3 mg/dL
EGFR: 42
Protein/Creatinine Ratio: 144

## 2022-10-19 ENCOUNTER — Other Ambulatory Visit (INDEPENDENT_AMBULATORY_CARE_PROVIDER_SITE_OTHER): Payer: Self-pay

## 2022-10-19 MED ORDER — DORZOLAMIDE HCL-TIMOLOL MAL 2-0.5 % OP SOLN: 1.0000 [drp] | Freq: Two times a day (BID) | OPHTHALMIC | 3 refills | Status: AC

## 2022-11-01 ENCOUNTER — Encounter (INDEPENDENT_AMBULATORY_CARE_PROVIDER_SITE_OTHER): Payer: Medicare Other | Admitting: Ophthalmology

## 2022-11-01 DIAGNOSIS — E113513 Type 2 diabetes mellitus with proliferative diabetic retinopathy with macular edema, bilateral: Secondary | ICD-10-CM

## 2022-11-01 DIAGNOSIS — I10 Essential (primary) hypertension: Secondary | ICD-10-CM

## 2022-11-01 DIAGNOSIS — Z961 Presence of intraocular lens: Secondary | ICD-10-CM

## 2022-11-01 DIAGNOSIS — H4313 Vitreous hemorrhage, bilateral: Secondary | ICD-10-CM

## 2022-11-01 DIAGNOSIS — H40053 Ocular hypertension, bilateral: Secondary | ICD-10-CM

## 2022-11-01 DIAGNOSIS — H25811 Combined forms of age-related cataract, right eye: Secondary | ICD-10-CM

## 2022-11-01 DIAGNOSIS — H35033 Hypertensive retinopathy, bilateral: Secondary | ICD-10-CM

## 2022-11-01 DIAGNOSIS — I639 Cerebral infarction, unspecified: Secondary | ICD-10-CM

## 2022-11-11 ENCOUNTER — Encounter (INDEPENDENT_AMBULATORY_CARE_PROVIDER_SITE_OTHER): Payer: Medicare Other | Admitting: Ophthalmology

## 2022-11-11 DIAGNOSIS — E113513 Type 2 diabetes mellitus with proliferative diabetic retinopathy with macular edema, bilateral: Secondary | ICD-10-CM

## 2022-11-11 DIAGNOSIS — I639 Cerebral infarction, unspecified: Secondary | ICD-10-CM

## 2022-11-11 DIAGNOSIS — I10 Essential (primary) hypertension: Secondary | ICD-10-CM

## 2022-11-11 DIAGNOSIS — Z961 Presence of intraocular lens: Secondary | ICD-10-CM

## 2022-11-11 DIAGNOSIS — H25811 Combined forms of age-related cataract, right eye: Secondary | ICD-10-CM

## 2022-11-11 DIAGNOSIS — H4313 Vitreous hemorrhage, bilateral: Secondary | ICD-10-CM

## 2022-11-11 DIAGNOSIS — H35033 Hypertensive retinopathy, bilateral: Secondary | ICD-10-CM

## 2022-11-11 DIAGNOSIS — H40053 Ocular hypertension, bilateral: Secondary | ICD-10-CM

## 2022-11-17 DIAGNOSIS — H25811 Combined forms of age-related cataract, right eye: Secondary | ICD-10-CM | POA: Diagnosis not present

## 2022-11-17 DIAGNOSIS — H35373 Puckering of macula, bilateral: Secondary | ICD-10-CM | POA: Diagnosis not present

## 2022-11-17 DIAGNOSIS — E113593 Type 2 diabetes mellitus with proliferative diabetic retinopathy without macular edema, bilateral: Secondary | ICD-10-CM | POA: Diagnosis not present

## 2022-11-17 DIAGNOSIS — H524 Presbyopia: Secondary | ICD-10-CM | POA: Diagnosis not present

## 2022-11-21 ENCOUNTER — Other Ambulatory Visit: Payer: Self-pay | Admitting: Internal Medicine

## 2022-11-21 NOTE — Telephone Encounter (Signed)
This is a historical medication.  Okay to fill? 

## 2022-12-01 NOTE — Progress Notes (Signed)
[facility] - Clinic Note  12/05/2022     CHIEF COMPLAINT Patient presents for Retina Follow Up   HISTORY OF PRESENT ILLNESS: Bill [name] is a 42 y.o. male who presents to the clinic today for:  HPI     Retina Follow Up   Patient presents with  Diabetic Retinopathy.  In both eyes.  This started 6 weeks ago.  I, the attending physician,  performed the HPI with the patient and updated documentation appropriately.        Comments   Patient here for  6 weeks retina follow up for PDR OU. Patient states vision about the same. May be worse. Hasn't had an injection in long time. Blurriness in OD from cataract. Having cataract surgery OD in September 24 or 26. No eye pain. Using drops.      Last edited by Rennis Chris, MD on 12/05/2022  3:20 PM.     Pt states he is having cataract sx OD with Dr. Zenaida Niece in September  Referring physician: Diona Foley, MD [address] [place],  [place] [address]  HISTORICAL INFORMATION:   Selected notes from the MEDICAL RECORD NUMBER Referred by Dr. Jorje Guild for concern of severe NPDR / PDR OU   CURRENT MEDICATIONS: Current Outpatient Medications (Ophthalmic Drugs)  Medication Sig   brimonidine (ALPHAGAN) 0.2 % ophthalmic solution Place 1 drop into the right eye 2 (two) times daily.   dorzolamide-timolol (COSOPT) 2-0.5 % ophthalmic solution Place 1 drop into the right eye 2 (two) times daily.   prednisoLONE acetate (PRED FORTE) 1 % ophthalmic suspension Place 1 drop into the right eye 4 (four) times daily.   No current facility-administered medications for this visit. (Ophthalmic Drugs)   Current Outpatient Medications (Other)  Medication Sig   atorvastatin (LIPITOR) 10 MG tablet TAKE 1 TABLET BY MOUTH EVERY DAY   cholecalciferol (VITAMIN D-400) 10 MCG (400 UNIT) TABS tablet Take 400 Units by mouth daily.   Dulaglutide (TRULICITY) 1.5 MG/0.5ML SOPN Inject 1.5 mg into the skin once a week.   fluticasone (FLONASE) 50  MCG/ACT nasal spray Place 1 spray into both nostrils daily as needed for allergies or rhinitis.   LANTUS SOLOSTAR 100 UNIT/ML Solostar Pen Inject 18 Units into the skin nightly.   mycophenolate (MYFORTIC) 180 MG EC tablet Take 720 mg by mouth 2 (two) times daily.   predniSONE (DELTASONE) 5 MG tablet Take 5 mg by mouth daily with breakfast.   sertraline (ZOLOFT) 50 MG tablet Take 1 tablet (50 mg total) by mouth daily.   sulfamethoxazole-trimethoprim (BACTRIM) 400-80 MG tablet Take 1 tablet by mouth every Monday, Wednesday, and Friday.   tacrolimus (PROGRAF) 1 MG capsule Take 3-4 mg by mouth See admin instructions. 4 mg in the morning, 3 mg in the evening   No current facility-administered medications for this visit. (Other)   REVIEW OF SYSTEMS: ROS   Positive for: Neurological, Genitourinary, Endocrine, Cardiovascular, Eyes, Psychiatric Negative for: Constitutional, Gastrointestinal, Skin, Musculoskeletal, HENT, Respiratory, Allergic/Imm, Heme/Lymph Last edited by Laddie Aquas, COA on 12/05/2022  8:08 AM.       ALLERGIES Allergies  Allergen Reactions   Food     Melons-itchy/headaches   PAST MEDICAL HISTORY Past Medical History:  Diagnosis Date   Anemia    Anxiety    situational   Diabetes mellitus without complication (HCC)    type 2   Diabetic retinopathy (HCC)    PDR OU   ESRD on dialysis (HCC)    living  unrelated donor renal transplant 11/05/20   History of blood transfusion    Hypertension    PONV (postoperative nausea and vomiting)    03/22/2019- only 15 years ago   Renal disorder    Stroke [facility]) 2019   vision   Past Surgical History:  Procedure Laterality Date   A/V FISTULAGRAM N/A 03/11/2019   Procedure: A/V FISTULAGRAM - Left Arm;  Surgeon: Maeola Harman, MD;  Location: [facility] INVASIVE CV LAB;  Service: Cardiovascular;  Laterality: N/A;   arm surgery Right    Fracture   AV FISTULA PLACEMENT Left 01/25/2019   Procedure: ARTERIOVENOUS (AV) FISTULA  CREATION  LEFT UPPER  ARM;  Surgeon: Nada Libman, MD;  Location: [facility] OR;  Service: Vascular;  Laterality: Left;   BASCILIC VEIN TRANSPOSITION Left 03/27/2019   Procedure: SECOND STAGE BASILIC VEIN TRANSPOSITION LEFT ARM;  Surgeon: Nada Libman, MD;  Location: [facility] OR;  Service: Vascular;  Laterality: Left;   KIDNEY TRANSPLANT  11/05/2020   [facility]   MEMBRANE PEEL Right 05/19/2022   Procedure: MEMBRANE PEEL;  Surgeon: Rennis Chris, MD;  Location: [facility] OR;  Service: Ophthalmology;  Laterality: Right;   PARS PLANA VITRECTOMY Right 05/19/2022   Procedure: TWENTY FIVE GAUGE PARS PLANA VITRECTOMY;  Surgeon: Rennis Chris, MD;  Location[facility] OR;  Service: Ophthalmology;  Laterality: Right;   PHOTOCOAGULATION WITH LASER Right 05/19/2022   Procedure: PHOTOCOAGULATION WITH LASER;  Surgeon: Rennis Chris, MD;  Location: [facility] OR;  Service: Ophthalmology;  Laterality: Right;   WISDOM TOOTH EXTRACTION     FAMILY HISTORY Family History  Problem Relation Age of Onset   Diabetes Mellitus II Mother    Hypertension Mother    Cataracts Mother    Diabetes Mellitus II Father    Stroke Father    Hypertension Father    Glaucoma Maternal Grandmother    Amblyopia Neg Hx    Blindness Neg Hx    Macular degeneration Neg Hx    Retinal detachment Neg Hx    Strabismus Neg Hx    Retinitis pigmentosa Neg Hx    SOCIAL HISTORY Social History   Tobacco Use   Smoking status: Never   Smokeless tobacco: Never  Vaping Use   Vaping Use: Never used  Substance Use Topics   Alcohol use: Not Currently   Drug use: Not Currently       OPHTHALMIC EXAM:  Base Eye Exam     Visual Acuity (Snellen - Linear)       Right Left   Dist [place] 20/80 20/60 -1   Dist ph [place] 20/40 -2 20/40         Tonometry (Tonopen, 8:03 AM)       Right Left   Pressure 17 16         Pupils       Dark Light Shape React APD   Right 4 4 Round NR None   Left 4 3 Round Brisk +1         Visual Fields       Left Right    Restrictions Total superior temporal, superior nasal deficiencies; Partial outer inferior temporal, inferior nasal deficiencies Partial outer inferior temporal deficiency; Partial inner inferior nasal deficiency         Extraocular Movement       Right Left    Full, Ortho Full, Ortho         Neuro/Psych     Oriented x3: Yes   Mood/Affect: Normal  Dilation     Both eyes: 1.0% Mydriacyl, 2.5% Phenylephrine @ 8:03 AM           Slit Lamp and Fundus Exam     Slit Lamp Exam       Right Left   Lids/Lashes Dermatochalasis - upper lid Normal   Conjunctiva/Sclera mild melanosis, Subconjunctival hemorrhage -- improving, sutures dissolving White and quiet   Cornea Trace tear film debris Trace Punctate epithelial erosions, well healed cataract wound   Anterior Chamber deep and clear deep, 2-3+fine cell and pigment   Iris Round and dilated, No NVI Round and dilated, no NVI   Lens 3+ Nuclear sclerosis, 3+ Cortical cataract, 2+Posterior subcapsular cataract PC IOL in good position   Anterior Vitreous post vitrectomy, VH cleared Syneresis, blood stained vitreous condensations -- slightly improved, blood clots settled inferiorly and turning white         Fundus Exam       Right Left   Disc 3+Pallor, Sharp rim, fibrosis gone 3+ Pallor, NVD with fibrosis -- regressed   C/D Ratio 0.1 0.2   Macula Flat, Good foveal reflex, scattered MA/DBH, fibrosis improved, cystic changes superior macula -- improved Flat, Blunted foveal reflex, ERM   Vessels attenuated, Tortuous, +fibrosis - improved, +NV inferior to disc -- regressing, +sheathing Sclerotic arterioles, Tortuous, +NV -- regressing, fibrosis along superior arcades, severe attenuation   Periphery Attached, scattered DBH, good fill in PRP 360 extending to arcades, pre retinal heme settled inferiorly and improved Attached, 360 PRP           Refraction     Wearing Rx       Sphere Cylinder Axis Add   Right -0.75 +1.00  164 +2.00   Left -0.25 +0.75 152 +2.00           IMAGING AND PROCEDURES  Imaging and Procedures for @TODAY @  OCT, Retina - OU - Both Eyes       Right Eye Quality was good. Central Foveal Thickness: 326. Progression has improved. Findings include no SRF, abnormal foveal contour, epiretinal membrane, intraretinal fluid (Mild ERM with sharpening of foveal contour, persistent cystic changes superior macula--slightly improved, stable improvement in vitreous opacities and tractional fibrosis).   Left Eye Quality was good. Central Foveal Thickness: 295. Progression has improved. Findings include no IRF, no SRF, abnormal foveal contour (Interval improvement in vitreous opacities).   Notes *Images captured and stored on drive  Diagnosis / Impression:  PDR w/ tractional fibrosis OU OD: Mild ERM with sharpening of foveal contour, persistent cystic changes superior macula -- slightly improved, stable improvement in vitreous opacities and tractional fibrosis OS: Interval improvement in vitreous opacities  Clinical management:  See below  Abbreviations: NFP - Normal foveal profile. CME - cystoid macular edema. PED - pigment epithelial detachment. IRF - intraretinal fluid. SRF - subretinal fluid. EZ - ellipsoid zone. ERM - epiretinal membrane. ORA - outer retinal atrophy. ORT - outer retinal tubulation. SRHM - subretinal hyper-reflective material       Intravitreal Injection, Pharmacologic Agent - OD - Right Eye       Time Out 12/05/2022. 8:19 AM. Confirmed correct patient, procedure, site, and patient consented.   Anesthesia Topical anesthesia was used. Anesthetic medications included Lidocaine 2%, Proparacaine 0.5%.   Procedure Preparation included 5% betadine to ocular surface, eyelid speculum. A supplied (32g) needle was used.   Injection: 1.25 mg Bevacizumab 1.25mg /0.78ml   Route: Intravitreal, Site: Right Eye   NDC: P3213405, Lot: [id number], Expiration date: 01/23/2023  Post-op Post injection exam found visual acuity of at least counting fingers. The patient tolerated the procedure well. There were no complications. The patient received written and verbal post procedure care education. Post injection medications were not given.      Intravitreal Injection, Pharmacologic Agent - OS - Left Eye       Time Out 12/05/2022. 8:19 AM. Confirmed correct patient, procedure, site, and patient consented.   Anesthesia Topical anesthesia was used. Anesthetic medications included Lidocaine 2%, Proparacaine 0.5%.   Procedure Preparation included 5% betadine to ocular surface, eyelid speculum. A (33g) needle was used.   Injection: 1.25 mg Bevacizumab 1.25mg /0.44ml   Route: Intravitreal, Site: Left Eye   NDC: P3213405, Lot: [id number], Expiration date: 03/10/2023   Post-op Post injection exam found visual acuity of at least counting fingers. The patient tolerated the procedure well. There were no complications. The patient received written and verbal post procedure care education. Post injection medications were not given.            ASSESSMENT/PLAN:   ICD-10-CM   1. Proliferative diabetic retinopathy of both eyes with macular edema associated with type 2 diabetes mellitus (HCC)  E11.3513 OCT, Retina - OU - Both Eyes    Intravitreal Injection, Pharmacologic Agent - OD - Right Eye    Intravitreal Injection, Pharmacologic Agent - OS - Left Eye    Bevacizumab (AVASTIN) SOLN 1.25 mg    Bevacizumab (AVASTIN) SOLN 1.25 mg    2. Vitreous hemorrhage of both eyes (HCC)  H43.13     3. Essential hypertension  I10     4. Hypertensive retinopathy of both eyes  H35.033     5. Acute CVA (cerebrovascular accident) (HCC)  I63.9     6. Ocular hypertension, bilateral  H40.053     7. 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 Pseudophakia  Z96.1       1,2. Proliferative diabetic retinopathy OU  - delayed f/u -- 10.9 wks instead of 6  -  last A1c was 10.5 on 12.21.23; 11.9 on 05.11.23; 9.6 on 11.29.22; 7.8 on 7.20.22; 7.2 on 01.13.22  - s/p PRP OD (10.02.19), (01.22.20), (02.12.20), (03.08.22)  - s/p PRP OS (05/02/2018), (02.19.20), (2.17.22)  - s/p IVK #1 OD (01.22.20), OS (02.12.20)  - s/p IVA OD #1 (10.21.20), #2 (12.02.20), #3 (01.13.21), #4 (02.10.21), #5 (04.19.21), #6 (05.21.21), #7 (06.21.21), #8 (08.19.21), #9 (10.11.21), #10 (12.01.21), #11 (01.25.22), #12 (03.01.22), #13 (04.05.22), #14 (6.6.22), #15 (09.22.22), #16 (03.28.23), #17 (08.04.23), #18 (11.01.23 for [facility]), #19 (11.29.23), #20 (01.02.23), #21 (02.12.24), #22 (04.09.24)  - s/p IVA OS #1 (12.02.20), #2 (04.19.21), #3 (05.21.21), #4 (06.21.21), #5 (08.19.21), #6 (10.11.21), #7 (12.01.21), #8 (01.25.22), #9 (03.01.22), #10 (04.05.22), #11 (6.6.22), #12 (09.22.22), #13 (12.04.23), #14 (01.02.23), #15 (02.12.24), #16 (04.09.24)  - FA 8.19.19 shows leaking MA, patches of capillary nonperfusion OU  - repeat FA 10.11.21 shows persistent NV OU, large patches of vascular nonperfusion OU (OS>OD) -- fill in PRP completed OU (OS  2.17.22, OD 3.8.22)  - repeat FA 9.13.22 shows interval improvement in NV/leakage  - pre-VH BCVA OD: 20/40  - s/p PPV/MP OD, 12.07.2023             - doing well   - BCVA 20/40 OD -- stable             - history of steroid response -- IOP elevated to 31+ prior -- improved to 17 today  - cont Cosopt and Brim  BID OD             - post op drop instructions reviewed   - OCT shows OD: Mild ERM with sharpening of foveal contour, persistent cystic changes superior macula -- slightly improved, stable improvement in vitreous opacities and tractional fibrosis; OS: Interval improvement in vitreous opacities at 10.9 weeks  - recommend IVA OU (OD #23 and OS #17) today, 06.24.24 with follow up in 8-10 weeks  - pt wishes to proceed with injections  - RBA of procedure discussed, questions answered - informed consent obtained and signed - see procedure note - f/u  8-10 weeks -- DFE/OCT, possible injxns  3,4. 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 Jorje Guild in mid August 2019 with a 2 wk history intractable headaches and decreased vision OU  - found to have BP of 170s/90s -- sent to ED and was admitted to [facility] where SBP was up to 200  - discussed importance of tight BP control   - pt reports compliance with BP meds and BP has improved  5. 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 Limited 2 sequence MRI head: Acute RIGHT periatrial subcentimeter  nonhemorrhagic infarct, in region of optic tract.  - could be limiting vision--will check formal VF once edema improves  6. Ocular Hypertention OU  - today, IOP 17,16  - continue cosopt and brim bid OU  7. Mixed cataract OD - The symptoms of cataract, surgical options, and treatments and risks were discussed with patient. - discussed diagnosis and progression - continue to monitor - surgery scheduled for March 09, 2023 with Dr. Zenaida Niece  8. Pseudophakia OS  - s/p CE/IOL (07.29.21, Dr. Alben Spittle)  - IOL in good position, doing well - continue to monitor  Ophthalmic Meds Ordered this visit:  Meds ordered this encounter  Medications   Bevacizumab (AVASTIN) SOLN 1.25 mg   Bevacizumab (AVASTIN) SOLN 1.25 mg     Return for f/u 8-10 weeks, PDR OU, DFE, OCT.  There are no Patient Instructions on file for this visit.   This document serves as a record of services personally performed by Karie Chimera, MD, PhD. It was created on their behalf by Annalee Genta, COMT. The creation of this record is the provider's dictation and/or activities during the visit.  Electronically signed by: Annalee Genta, COMT 12/05/22 3:20 PM  This document serves as a record of services personally performed by Karie Chimera, MD, PhD. It was created on their behalf by Glee Arvin. Manson Passey, OA an ophthalmic technician. The creation of this record is the  provider's dictation and/or activities during the visit.    Electronically signed by: Glee Arvin. Manson Passey, [place] 06.24.2024 3:20 PM  Karie Chimera, M.D., Ph.D. Diseases & Surgery of the Retina and Vitreous [facility]  I have reviewed the above documentation for accuracy and completeness, and I agree with the above. Karie Chimera, M.D., Ph.D. 12/05/22 3:37 PM   Abbreviations: M myopia (nearsighted); A astigmatism; H hyperopia (farsighted); P presbyopia; Mrx spectacle prescription;  CTL contact lenses; OD right eye; OS left eye; OU both eyes  XT exotropia; ET esotropia; PEK punctate epithelial keratitis; PEE punctate epithelial erosions; DES dry eye syndrome; MGD meibomian gland dysfunction; ATs artificial tears; PFAT's preservative free artificial tears; NSC nuclear sclerotic cataract; PSC posterior subcapsular cataract; ERM epi-retinal membrane; PVD posterior vitreous detachment; RD retinal detachment; DM diabetes mellitus; DR  diabetic retinopathy; NPDR non-proliferative diabetic retinopathy; PDR proliferative diabetic retinopathy; CSME clinically significant macular edema; DME diabetic macular edema; dbh dot blot hemorrhages; CWS cotton wool spot; POAG primary open angle glaucoma; C/D cup-to-disc ratio; HVF humphrey visual field; GVF goldmann visual field; OCT optical coherence tomography; IOP intraocular pressure; BRVO Branch retinal vein occlusion; CRVO central retinal vein occlusion; CRAO central retinal artery occlusion; BRAO branch retinal artery occlusion; RT retinal tear; SB scleral buckle; PPV pars plana vitrectomy; VH Vitreous hemorrhage; PRP panretinal laser photocoagulation; IVK intravitreal kenalog; VMT vitreomacular traction; MH Macular hole;  NVD neovascularization of the disc; NVE neovascularization elsewhere; AREDS age related eye disease study; ARMD age related macular degeneration; POAG primary open angle glaucoma; EBMD epithelial/anterior basement membrane  dystrophy; ACIOL anterior chamber intraocular lens; IOL intraocular lens; PCIOL posterior chamber intraocular lens; Phaco/IOL phacoemulsification with intraocular lens placement; PRK photorefractive keratectomy; LASIK laser assisted in situ keratomileusis; HTN hypertension; DM diabetes mellitus; COPD chronic obstructive pulmonary disease

## 2022-12-05 ENCOUNTER — Ambulatory Visit (INDEPENDENT_AMBULATORY_CARE_PROVIDER_SITE_OTHER): Payer: Medicare Other | Admitting: Ophthalmology

## 2022-12-05 ENCOUNTER — Encounter (INDEPENDENT_AMBULATORY_CARE_PROVIDER_SITE_OTHER): Payer: Self-pay | Admitting: Ophthalmology

## 2022-12-05 DIAGNOSIS — H35033 Hypertensive retinopathy, bilateral: Secondary | ICD-10-CM | POA: Diagnosis not present

## 2022-12-05 DIAGNOSIS — Z961 Presence of intraocular lens: Secondary | ICD-10-CM

## 2022-12-05 DIAGNOSIS — E113513 Type 2 diabetes mellitus with proliferative diabetic retinopathy with macular edema, bilateral: Secondary | ICD-10-CM | POA: Diagnosis not present

## 2022-12-05 DIAGNOSIS — H4313 Vitreous hemorrhage, bilateral: Secondary | ICD-10-CM

## 2022-12-05 DIAGNOSIS — H40053 Ocular hypertension, bilateral: Secondary | ICD-10-CM

## 2022-12-05 DIAGNOSIS — I10 Essential (primary) hypertension: Secondary | ICD-10-CM | POA: Diagnosis not present

## 2022-12-05 DIAGNOSIS — H25811 Combined forms of age-related cataract, right eye: Secondary | ICD-10-CM

## 2022-12-05 DIAGNOSIS — I639 Cerebral infarction, unspecified: Secondary | ICD-10-CM

## 2022-12-05 MED ORDER — BEVACIZUMAB CHEMO INJECTION 1.25MG/0.05ML SYRINGE FOR KALEIDOSCOPE
1.2500 mg | INTRAVITREAL | Status: AC | PRN
Start: 2022-12-05 — End: 2022-12-05
  Administered 2022-12-05: 1.25 mg via INTRAVITREAL

## 2023-01-16 ENCOUNTER — Other Ambulatory Visit: Payer: Self-pay | Admitting: Internal Medicine

## 2023-01-16 DIAGNOSIS — F411 Generalized anxiety disorder: Secondary | ICD-10-CM

## 2023-01-30 ENCOUNTER — Encounter (INDEPENDENT_AMBULATORY_CARE_PROVIDER_SITE_OTHER): Payer: Medicare Other | Admitting: Ophthalmology

## 2023-01-30 DIAGNOSIS — H4313 Vitreous hemorrhage, bilateral: Secondary | ICD-10-CM

## 2023-01-30 DIAGNOSIS — E113513 Type 2 diabetes mellitus with proliferative diabetic retinopathy with macular edema, bilateral: Secondary | ICD-10-CM

## 2023-01-30 DIAGNOSIS — I10 Essential (primary) hypertension: Secondary | ICD-10-CM

## 2023-01-30 DIAGNOSIS — I639 Cerebral infarction, unspecified: Secondary | ICD-10-CM

## 2023-01-30 DIAGNOSIS — Z961 Presence of intraocular lens: Secondary | ICD-10-CM

## 2023-01-30 DIAGNOSIS — H35033 Hypertensive retinopathy, bilateral: Secondary | ICD-10-CM

## 2023-01-30 DIAGNOSIS — H25811 Combined forms of age-related cataract, right eye: Secondary | ICD-10-CM

## 2023-01-30 DIAGNOSIS — H40053 Ocular hypertension, bilateral: Secondary | ICD-10-CM

## 2023-02-16 ENCOUNTER — Other Ambulatory Visit: Payer: Self-pay | Admitting: Internal Medicine

## 2023-02-17 NOTE — Progress Notes (Signed)
[facility] - Clinic Note  02/20/2023     CHIEF COMPLAINT Patient presents for Retina Follow Up   HISTORY OF PRESENT ILLNESS: Bill [name] is a 42 y.o. male who presents to the clinic today for:  HPI     Retina Follow Up   Patient presents with  Diabetic Retinopathy.  In both eyes.  This started 11 weeks ago.  I, the attending physician,  performed the HPI with the patient and updated documentation appropriately.        Comments   Patient here for 10 weeks (11 weeks) for retina follow up for PDR OU. Patient states vision about the same. Has cataract surgery OD scheduled for September 24 th. No eye pain.       Last edited by Rennis Chris, MD on 02/20/2023 11:45 AM.    Pt states his vision seems the same, he has cataract sx OD scheduled for September 24  Referring physician: Philip Aspen, Limmie Patricia, MD [id number] [place],  [place] [address]  HISTORICAL INFORMATION:   Selected notes from the MEDICAL RECORD NUMBER Referred by Dr. Jorje Guild for concern of severe NPDR / PDR OU   CURRENT MEDICATIONS: Current Outpatient Medications (Ophthalmic Drugs)  Medication Sig   brimonidine (ALPHAGAN) 0.2 % ophthalmic solution Place 1 drop into the right eye 2 (two) times daily.   dorzolamide-timolol (COSOPT) 2-0.5 % ophthalmic solution Place 1 drop into the right eye 2 (two) times daily.   prednisoLONE acetate (PRED FORTE) 1 % ophthalmic suspension Place 1 drop into the right eye 4 (four) times daily.   No current facility-administered medications for this visit. (Ophthalmic Drugs)   Current Outpatient Medications (Other)  Medication Sig   atorvastatin (LIPITOR) 10 MG tablet TAKE 1 TABLET BY MOUTH EVERY DAY   cholecalciferol (VITAMIN D-400) 10 MCG (400 UNIT) TABS tablet Take 400 Units by mouth daily.   Continuous Glucose Sensor (FREESTYLE LIBRE 2 SENSOR) MISC Inject 1 sensor to the skin every 14 days for continuous glucose monitoring.   Dulaglutide  (TRULICITY) 1.5 MG/0.5ML SOPN Inject 1.5 mg into the skin once a week.   fluticasone (FLONASE) 50 MCG/ACT nasal spray Place 1 spray into both nostrils daily as needed for allergies or rhinitis.   LANTUS SOLOSTAR 100 UNIT/ML Solostar Pen Inject 18 Units into the skin nightly.   mycophenolate (MYFORTIC) 180 MG EC tablet Take 720 mg by mouth 2 (two) times daily.   predniSONE (DELTASONE) 5 MG tablet Take 5 mg by mouth daily with breakfast.   sertraline (ZOLOFT) 50 MG tablet TAKE 1 TABLET BY MOUTH EVERY DAY   sulfamethoxazole-trimethoprim (BACTRIM) 400-80 MG tablet Take 1 tablet by mouth every Monday, Wednesday, and Friday.   tacrolimus (PROGRAF) 1 MG capsule Take 3-4 mg by mouth See admin instructions. 4 mg in the morning, 3 mg in the evening   No current facility-administered medications for this visit. (Other)   REVIEW OF SYSTEMS: ROS   Positive for: Neurological, Genitourinary, Endocrine, Cardiovascular, Eyes, Psychiatric Negative for: Constitutional, Gastrointestinal, Skin, Musculoskeletal, HENT, Respiratory, Allergic/Imm, Heme/Lymph Last edited by Laddie Aquas, COA on 02/20/2023  9:30 AM.     ALLERGIES Allergies  Allergen Reactions   Food     Melons-itchy/headaches   PAST MEDICAL HISTORY Past Medical History:  Diagnosis Date   Anemia    Anxiety    situational   Diabetes mellitus without complication (HCC)    type 2   Diabetic retinopathy (HCC)    PDR  OU   ESRD on dialysis [facility])    living unrelated donor renal transplant 11/05/20   History of blood transfusion    Hypertension    PONV (postoperative nausea and vomiting)    03/22/2019- only 15 years ago   Renal disorder    Stroke [facility]) 2019   vision   Past Surgical History:  Procedure Laterality Date   A/V FISTULAGRAM N/A 03/11/2019   Procedure: A/V FISTULAGRAM - Left Arm;  Surgeon: Maeola Harman, MD;  Location: [facility] INVASIVE CV LAB;  Service: Cardiovascular;  Laterality: N/A;   arm surgery Right    Fracture    AV FISTULA PLACEMENT Left 01/25/2019   Procedure: ARTERIOVENOUS (AV) FISTULA CREATION  LEFT UPPER  ARM;  Surgeon: Nada Libman, MD;  Location: [facility] OR;  Service: Vascular;  Laterality: Left;   BASCILIC VEIN TRANSPOSITION Left 03/27/2019   Procedure: SECOND STAGE BASILIC VEIN TRANSPOSITION LEFT ARM;  Surgeon: Nada Libman, MD;  Location: [facility] OR;  Service: Vascular;  Laterality: Left;   KIDNEY TRANSPLANT  11/05/2020   [facility]   MEMBRANE PEEL Right 05/19/2022   Procedure: MEMBRANE PEEL;  Surgeon: Rennis Chris, MD;  Location: [facility] OR;  Service: Ophthalmology;  Laterality: Right;   PARS PLANA VITRECTOMY Right 05/19/2022   Procedure: TWENTY FIVE GAUGE PARS PLANA VITRECTOMY;  Surgeon: Rennis Chris, MD;  Location: [facility] OR;  Service: Ophthalmology;  Laterality: Right;   PHOTOCOAGULATION WITH LASER Right 05/19/2022   Procedure: PHOTOCOAGULATION WITH LASER;  Surgeon: Rennis Chris, MD;  Location: [facility] OR;  Service: Ophthalmology;  Laterality: Right;   WISDOM TOOTH EXTRACTION     FAMILY HISTORY Family History  Problem Relation Age of Onset   Diabetes Mellitus II Mother    Hypertension Mother    Cataracts Mother    Diabetes Mellitus II Father    Stroke Father    Hypertension Father    Glaucoma Maternal Grandmother    Amblyopia Neg Hx    Blindness Neg Hx    Macular degeneration Neg Hx    Retinal detachment Neg Hx    Strabismus Neg Hx    Retinitis pigmentosa Neg Hx    SOCIAL HISTORY Social History   Tobacco Use   Smoking status: Never   Smokeless tobacco: Never  Vaping Use   Vaping status: Never Used  Substance Use Topics   Alcohol use: Not Currently   Drug use: Not Currently       OPHTHALMIC EXAM:  Base Eye Exam     Visual Acuity (Snellen - Linear)       Right Left   Dist [place] 20/80 -2 20/60   Dist ph [place] 20/50 -2          Tonometry (Tonopen, 9:25 AM)       Right Left   Pressure 11 11         Pupils       Dark Light Shape React APD   Right 4 4 Round NR  None   Left 4 3 Round Brisk +1         Visual Fields (Counting fingers)       Left Right   Restrictions Total superior temporal, superior nasal deficiencies; Partial outer inferior temporal, inferior nasal deficiencies Partial outer inferior temporal deficiency; Partial inner inferior nasal deficiency         Extraocular Movement       Right Left    Full, Ortho Full, Ortho         Neuro/Psych  Oriented x3: Yes   Mood/Affect: Normal         Dilation     Both eyes: 1.0% Mydriacyl, 2.5% Phenylephrine @ 9:25 AM           Slit Lamp and Fundus Exam     Slit Lamp Exam       Right Left   Lids/Lashes Dermatochalasis - upper lid Normal   Conjunctiva/Sclera mild melanosis, Subconjunctival hemorrhage -- improving, sutures dissolving White and quiet   Cornea Trace tear film debris Trace Punctate epithelial erosions, well healed cataract wound   Anterior Chamber deep and clear deep, 2-3+fine cell and pigment   Iris Round and dilated, No NVI Round and dilated, no NVI   Lens 3+ Nuclear sclerosis, 3+ Cortical cataract, 2+Posterior subcapsular cataract PC IOL in good position   Anterior Vitreous post vitrectomy, VH cleared Syneresis, blood stained vitreous condensations -- slightly improved, blood clots settled inferiorly and white in color         Fundus Exam       Right Left   Disc 3+Pallor, Sharp rim, fibrosis gone 3+ Pallor, NVD with fibrosis -- regressed   C/D Ratio 0.1 0.2   Macula Flat, Good foveal reflex, scattered MA/DBH, fibrosis improved, cystic changes superior macula -- improved Flat, Blunted foveal reflex, ERM   Vessels attenuated, Tortuous, +fibrosis - improved, +NV inferior to disc -- regressing, +sheathing Sclerotic arterioles, Tortuous, +NV -- regressed, fibrosis along superior arcades, severe attenuation   Periphery Attached, rare DBH, good fill in PRP 360 extending to arcades, pre retinal heme settled inferiorly and improved Attached, 360 PRP            Refraction     Wearing Rx       Sphere Cylinder Axis Add   Right -0.75 +1.00 164 +2.00   Left -0.25 +0.75 152 +2.00           IMAGING AND PROCEDURES  Imaging and Procedures for @TODAY @  OCT, Retina - OU - Both Eyes       Right Eye Quality was good. Central Foveal Thickness: 328. Progression has improved. Findings include no SRF, abnormal foveal contour, epiretinal membrane, intraretinal fluid (Mild ERM with sharpening of foveal contour and mild pucker, persistent cystic changes superior macula -- slightly improved, stable improvement in vitreous opacities and tractional fibrosis).   Left Eye Quality was good. Central Foveal Thickness: 285. Progression has improved. Findings include no IRF, no SRF, abnormal foveal contour, epiretinal membrane, preretinal fibrosis (interval improvement in mild vitreous opacities, diffuse atrophy).   Notes *Images captured and stored on drive  Diagnosis / Impression:  PDR w/ tractional fibrosis OU OD: Mild ERM with sharpening of foveal contour and mild pucker, persistent cystic changes superior macula -- slightly improved, stable improvement in vitreous opacities and tractional fibrosis OS: interval improvement in mild vitreous opacities, diffuse atrophy  Clinical management:  See below  Abbreviations: NFP - Normal foveal profile. CME - cystoid macular edema. PED - pigment epithelial detachment. IRF - intraretinal fluid. SRF - subretinal fluid. EZ - ellipsoid zone. ERM - epiretinal membrane. ORA - outer retinal atrophy. ORT - outer retinal tubulation. SRHM - subretinal hyper-reflective material       Intravitreal Injection, Pharmacologic Agent - OD - Right Eye       Time Out 02/20/2023. 10:10 AM. Confirmed correct patient, procedure, site, and patient consented.   Anesthesia Topical anesthesia was used. Anesthetic medications included Lidocaine 2%, Proparacaine 0.5%.   Procedure Preparation included 5% betadine to ocular  surface, eyelid speculum. A supplied (32g) needle was used.   Injection: 1.25 mg Bevacizumab 1.25mg /0.34ml   Route: Intravitreal, Site: Right Eye   NDC: P3213405, Lot: [id number], Expiration date: 03/04/2023   Post-op Post injection exam found visual acuity of at least counting fingers. The patient tolerated the procedure well. There were no complications. The patient received written and verbal post procedure care education. Post injection medications were not given.      Intravitreal Injection, Pharmacologic Agent - OS - Left Eye       Time Out 02/20/2023. 10:11 AM. Confirmed correct patient, procedure, site, and patient consented.   Anesthesia Topical anesthesia was used. Anesthetic medications included Lidocaine 2%, Proparacaine 0.5%.   Procedure Preparation included 5% betadine to ocular surface, eyelid speculum. A (33g) needle was used.   Injection: 1.25 mg Bevacizumab 1.25mg /0.55ml   Route: Intravitreal, Site: Left Eye   NDC: P3213405, Lot: [id number], Expiration date: 03/20/2023   Post-op Post injection exam found visual acuity of at least counting fingers. The patient tolerated the procedure well. There were no complications. The patient received written and verbal post procedure care education. Post injection medications were not given.            ASSESSMENT/PLAN:   ICD-10-CM   1. Proliferative diabetic retinopathy of both eyes with macular edema associated with type 2 diabetes mellitus (HCC)  E11.3513 OCT, Retina - OU - Both Eyes    Intravitreal Injection, Pharmacologic Agent - OD - Right Eye    Intravitreal Injection, Pharmacologic Agent - OS - Left Eye    Bevacizumab (AVASTIN) SOLN 1.25 mg    Bevacizumab (AVASTIN) SOLN 1.25 mg    2. Vitreous hemorrhage of both eyes (HCC)  H43.13     3. Essential hypertension  I10     4. Hypertensive retinopathy of both eyes  H35.033     5. Acute CVA (cerebrovascular accident) (HCC)  I63.9     6. Ocular hypertension,  bilateral  H40.053     7. 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 Pseudophakia  Z96.1      1,2. Proliferative diabetic retinopathy OU  - delayed f/u -- 11 wks instead of 8-10  - last A1c was 10.5 on 12.21.23; 11.9 on 05.11.23; 9.6 on 11.29.22; 7.8 on 7.20.22; 7.2 on 01.13.22  - s/p PRP OD (10.02.19), (01.22.20), (02.12.20), (03.08.22)  - s/p PRP OS (05/02/2018), (02.19.20), (2.17.22)  - s/p IVK #1 OD (01.22.20), OS (02.12.20)  - s/p IVA OD #1 (10.21.20), #2 (12.02.20), #3 (01.13.21), #4 (02.10.21), #5 (04.19.21), #6 (05.21.21), #7 (06.21.21), #8 (08.19.21), #9 (10.11.21), #10 (12.01.21), #11 (01.25.22), #12 (03.01.22), #13 (04.05.22), #14 (6.6.22), #15 (09.22.22), #16 (03.28.23), #17 (08.04.23), #18 (11.01.23 for [facility]), #19 (11.29.23), #20 (01.02.23), #21 (02.12.24), #22 (04.09.24), #23 (06.24.24)  - s/p IVA OS #1 (12.02.20), #2 (04.19.21), #3 (05.21.21), #4 (06.21.21), #5 (08.19.21), #6 (10.11.21), #7 (12.01.21), #8 (01.25.22), #9 (03.01.22), #10 (04.05.22), #11 (6.6.22), #12 (09.22.22), #13 (12.04.23), #14 (01.02.23), #15 (02.12.24), #16 (04.09.24), #17 (06.24.24)  - FA 8.19.19 shows leaking MA, patches of capillary nonperfusion OU  - repeat FA 10.11.21 shows persistent NV OU, large patches of vascular nonperfusion OU (OS>OD) -- fill in PRP completed OU (OS  2.17.22, OD 3.8.22)  - repeat FA 9.13.22 shows interval improvement in NV/leakage  - pre-VH BCVA OD: 20/40  - s/p PPV/MP OD, 12.07.2023             - doing well   - BCVA 20/50  OD -- decreased (cataract)             - history of steroid response -- IOP elevated to 31+ prior -- improved to 11 today  - cont Cosopt and Brim BID OD             - post op drop instructions reviewed   - OCT shows OD: Mild ERM with sharpening of foveal contour and mild pucker, persistent cystic changes superior macula -- slightly improved, stable improvement in vitreous opacities and tractional fibrosis; OS: stable improvement in  vitreous opacities at 11 weeks  - recommend IVA OU (OD #24 and OS #18) today, 09.09.24 with follow up in 12 weeks  - pt wishes to proceed with injections  - RBA of procedure discussed, questions answered - informed consent obtained and signed - see procedure note - f/u 12 weeks -- DFE/OCT, possible injxns -- sign new consent  3,4. 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 Jorje Guild in mid August 2019 with a 2 wk history intractable headaches and decreased vision OU  - found to have BP of 170s/90s -- sent to ED and was admitted to [facility] where SBP was up to 200  - discussed importance of tight BP control   - pt reports compliance with BP meds and BP has improved  5. 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 Limited 2 sequence MRI head: Acute RIGHT periatrial subcentimeter  nonhemorrhagic infarct, in region of optic tract.  - could be limiting field of vision-will check formal VF once edema improves  6. Ocular Hypertention OU  - today, IOP 11 OU  - continue cosopt and brimonidine bid OU  7. Mixed cataract OD - The symptoms of cataract, surgical options, and treatments and risks were discussed with patient. - discussed diagnosis and progression - surgery scheduled for March 07, 2023 with Dr. Zenaida Niece  8. Pseudophakia OS  - s/p CE/IOL (07.29.21, Dr. Alben Spittle)  - IOL in good position, doing well - continue to monitor  Ophthalmic Meds Ordered this visit:  Meds ordered this encounter  Medications   Bevacizumab (AVASTIN) SOLN 1.25 mg   Bevacizumab (AVASTIN) SOLN 1.25 mg     Return in about 12 weeks (around 05/15/2023) for f/u PDR OU, DFE, OCT.  There are no Patient Instructions on file for this visit.   This document serves as a record of services personally performed by Karie Chimera, MD, PhD. It was created on their behalf by Annalee Genta, COMT. The creation of this record is the provider's dictation  and/or activities during the visit.  Electronically signed by: Annalee Genta, COMT 02/20/23 12:04 PM  This document serves as a record of services personally performed by Karie Chimera, MD, PhD. It was created on their behalf by Glee Arvin. Manson Passey, OA an ophthalmic technician. The creation of this record is the provider's dictation and/or activities during the visit.    Electronically signed by: Glee Arvin. Manson Passey, OA 02/20/23 12:04 PM  Karie Chimera, M.D., Ph.D. Diseases & Surgery of the Retina and Vitreous [facility]  I have reviewed the above documentation for accuracy and completeness, and I agree with the above. Karie Chimera, M.D., Ph.D. 02/20/23 12:05 PM  Abbreviations: M myopia (nearsighted); A astigmatism; H hyperopia (farsighted); P presbyopia; Mrx spectacle prescription;  CTL contact lenses; OD right eye; OS left eye; OU both eyes  XT exotropia; ET  esotropia; PEK punctate epithelial keratitis; PEE punctate epithelial erosions; DES dry eye syndrome; MGD meibomian gland dysfunction; ATs artificial tears; PFAT's preservative free artificial tears; NSC nuclear sclerotic cataract; PSC posterior subcapsular cataract; ERM epi-retinal membrane; PVD posterior vitreous detachment; RD retinal detachment; DM diabetes mellitus; DR diabetic retinopathy; NPDR non-proliferative diabetic retinopathy; PDR proliferative diabetic retinopathy; CSME clinically significant macular edema; DME diabetic macular edema; dbh dot blot hemorrhages; CWS cotton wool spot; POAG primary open angle glaucoma; C/D cup-to-disc ratio; HVF humphrey visual field; GVF goldmann visual field; OCT optical coherence tomography; IOP intraocular pressure; BRVO Branch retinal vein occlusion; CRVO central retinal vein occlusion; CRAO central retinal artery occlusion; BRAO branch retinal artery occlusion; RT retinal tear; SB scleral buckle; PPV pars plana vitrectomy; VH Vitreous hemorrhage; PRP panretinal laser  photocoagulation; IVK intravitreal kenalog; VMT vitreomacular traction; MH Macular hole;  NVD neovascularization of the disc; NVE neovascularization elsewhere; AREDS age related eye disease study; ARMD age related macular degeneration; POAG primary open angle glaucoma; EBMD epithelial/anterior basement membrane dystrophy; ACIOL anterior chamber intraocular lens; IOL intraocular lens; PCIOL posterior chamber intraocular lens; Phaco/IOL phacoemulsification with intraocular lens placement; PRK photorefractive keratectomy; LASIK laser assisted in situ keratomileusis; HTN hypertension; DM diabetes mellitus; COPD chronic obstructive pulmonary disease

## 2023-02-18 ENCOUNTER — Other Ambulatory Visit: Payer: Self-pay | Admitting: Internal Medicine

## 2023-02-18 DIAGNOSIS — F411 Generalized anxiety disorder: Secondary | ICD-10-CM

## 2023-02-20 ENCOUNTER — Ambulatory Visit (INDEPENDENT_AMBULATORY_CARE_PROVIDER_SITE_OTHER): Payer: Medicare Other | Admitting: Ophthalmology

## 2023-02-20 ENCOUNTER — Encounter (INDEPENDENT_AMBULATORY_CARE_PROVIDER_SITE_OTHER): Payer: Self-pay | Admitting: Ophthalmology

## 2023-02-20 DIAGNOSIS — E113513 Type 2 diabetes mellitus with proliferative diabetic retinopathy with macular edema, bilateral: Secondary | ICD-10-CM

## 2023-02-20 DIAGNOSIS — H4313 Vitreous hemorrhage, bilateral: Secondary | ICD-10-CM

## 2023-02-20 DIAGNOSIS — H35033 Hypertensive retinopathy, bilateral: Secondary | ICD-10-CM | POA: Diagnosis not present

## 2023-02-20 DIAGNOSIS — I639 Cerebral infarction, unspecified: Secondary | ICD-10-CM

## 2023-02-20 DIAGNOSIS — Z961 Presence of intraocular lens: Secondary | ICD-10-CM

## 2023-02-20 DIAGNOSIS — I10 Essential (primary) hypertension: Secondary | ICD-10-CM | POA: Diagnosis not present

## 2023-02-20 DIAGNOSIS — H40053 Ocular hypertension, bilateral: Secondary | ICD-10-CM

## 2023-02-20 DIAGNOSIS — H25811 Combined forms of age-related cataract, right eye: Secondary | ICD-10-CM

## 2023-02-20 MED ORDER — BEVACIZUMAB CHEMO INJECTION 1.25MG/0.05ML SYRINGE FOR KALEIDOSCOPE
1.2500 mg | INTRAVITREAL | Status: AC | PRN
Start: 2023-02-20 — End: 2023-02-20
  Administered 2023-02-20: 1.25 mg via INTRAVITREAL

## 2023-03-07 DIAGNOSIS — H25811 Combined forms of age-related cataract, right eye: Secondary | ICD-10-CM | POA: Diagnosis not present

## 2023-03-07 DIAGNOSIS — H268 Other specified cataract: Secondary | ICD-10-CM | POA: Diagnosis not present

## 2023-03-08 ENCOUNTER — Encounter: Payer: Self-pay | Admitting: Internal Medicine

## 2023-03-08 LAB — HM DIABETES EYE EXAM

## 2023-03-28 ENCOUNTER — Ambulatory Visit (HOSPITAL_COMMUNITY)
Admission: EM | Admit: 2023-03-28 | Discharge: 2023-03-28 | Disposition: A | Discharge status: Home / Self Care | Payer: Medicare Other | Attending: Family Medicine | Admitting: Family Medicine

## 2023-03-28 ENCOUNTER — Encounter (HOSPITAL_COMMUNITY): Payer: Self-pay | Admitting: *Deleted

## 2023-03-28 ENCOUNTER — Telehealth (HOSPITAL_COMMUNITY): Payer: Self-pay

## 2023-03-28 DIAGNOSIS — B029 Zoster without complications: Secondary | ICD-10-CM | POA: Diagnosis not present

## 2023-03-28 MED ORDER — VALACYCLOVIR HCL 1 G PO TABS: 1000.0000 mg | ORAL_TABLET | Freq: Three times a day (TID) | ORAL | 0 refills | Status: AC

## 2023-03-28 NOTE — ED Triage Notes (Signed)
Pt states that he noticed a rash on his face Sunday and Monday it was worse so he took some benadryl. The rash how now spread to his eyes. He is now having a headache too.

## 2023-03-28 NOTE — Telephone Encounter (Signed)
Stacy from [facility] called to see why patient was sent over. Staff spoke with Dr. Tracie Harrier who states he told the patient to call to see if they could work him in sooner then his appointment schedule.

## 2023-03-29 NOTE — ED Provider Notes (Signed)
[facility]   [id number] 03/28/23 Arrival Time: 1408  ASSESSMENT & PLAN:  1. Herpes zoster without complication    Begin: Meds ordered this encounter  Medications   valACYclovir (VALTREX) 1000 MG tablet    Sig: Take 1 tablet (1,000 mg total) by mouth 3 (three) times daily.    Dispense:  21 tablet    Refill:  0   Reports that he has an ophthalmologist and will call them today to inquire whether they can see him today or tomorrow. Prefers OTC analgesics as needed.  Reviewed expectations re: course of current medical issues. Questions answered. Outlined signs and symptoms indicating need for more acute intervention. Patient verbalized understanding. After Visit Summary given.   SUBJECTIVE:  Bill [name] is a 42 y.o. male who presents with complaint of facial rash; noted a few d ago; slightly worse now. Burning/tingling above L eye before noting rash. No h/o similar. No tx PTA. Denies eye pain or visual changes.  OBJECTIVE:  Vitals:   03/28/23 1428  BP: (!) 143/82  Pulse: 99  Resp: 18  Temp: 98.6 F (37 C)  TempSrc: Oral  SpO2: 98%    General appearance: alert; no distress HEENT: [place]; AT; PERRLA; no restriction of the extraocular movements OS: without reported pain; with minimal conjunctival injection; without drainage; without corneal gross opacities; without limbal flush; without periorbital swelling or erythema Neck: supple without LAD Lungs: clear to auscultation bilaterally; unlabored respirations Heart: regular rate and rhythm Skin: warm and dry; crops of red/purplish papules over L V1 dermatome of face; tip of nose is spared Psychological: alert and cooperative; normal mood and affect     Allergies  Allergen Reactions   Food     Melons-itchy/headaches    Past Medical History:  Diagnosis Date   Anemia    Anxiety    situational   Diabetes mellitus without complication (HCC)    type 2   Diabetic retinopathy (HCC)    PDR OU   ESRD on  dialysis (HCC)    living unrelated donor renal transplant 11/05/20   History of blood transfusion    Hypertension    PONV (postoperative nausea and vomiting)    03/22/2019- only 15 years ago   Renal disorder    Stroke (HCC) 2019   vision   Social History   Socioeconomic History   Marital status: Married    Spouse name: Not on file   Number of children: Not on file   Years of education: Not on file   Highest education level: Not on file  Occupational History   Not on file  Tobacco Use   Smoking status: Never   Smokeless tobacco: Never  Vaping Use   Vaping status: Never Used  Substance and Sexual Activity   Alcohol use: Not Currently   Drug use: Not Currently   Sexual activity: Yes  Other Topics Concern   Not on file  Social History Narrative   ** Merged History Encounter **       Social Determinants of Health   Financial Resource Strain: Not on file  Food Insecurity: Not on file  Transportation Needs: Not on file  Physical Activity: Not on file  Stress: Not on file  Social Connections: Not on file  Intimate Partner Violence: Not on file   Family History  Problem Relation Age of Onset   Diabetes Mellitus II Mother    Hypertension Mother    Cataracts Mother    Diabetes Mellitus II Father  Stroke Father    Hypertension Father    Glaucoma Maternal Grandmother    Amblyopia Neg Hx    Blindness Neg Hx    Macular degeneration Neg Hx    Retinal detachment Neg Hx    Strabismus Neg Hx    Retinitis pigmentosa Neg Hx    Past Surgical History:  Procedure Laterality Date   A/V FISTULAGRAM N/A 03/11/2019   Procedure: A/V FISTULAGRAM - Left Arm;  Surgeon: Maeola Harman, MD;  Location: [facility] INVASIVE CV LAB;  Service: Cardiovascular;  Laterality: N/A;   arm surgery Right    Fracture   AV FISTULA PLACEMENT Left 01/25/2019   Procedure: ARTERIOVENOUS (AV) FISTULA CREATION  LEFT UPPER  ARM;  Surgeon: Nada Libman, MD;  Location: [facility] OR;  Service: Vascular;   Laterality: Left;   BASCILIC VEIN TRANSPOSITION Left 03/27/2019   Procedure: SECOND STAGE BASILIC VEIN TRANSPOSITION LEFT ARM;  Surgeon: Nada Libman, MD;  Location: [facility] OR;  Service: Vascular;  Laterality: Left;   KIDNEY TRANSPLANT  11/05/2020   [facility]   MEMBRANE PEEL Right 05/19/2022   Procedure: MEMBRANE PEEL;  Surgeon: Rennis Chris, MD;  Location: [facility] OR;  Service: Ophthalmology;  Laterality: Right;   PARS PLANA VITRECTOMY Right 05/19/2022   Procedure: TWENTY FIVE GAUGE PARS PLANA VITRECTOMY;  Surgeon: Rennis Chris, MD;  Location: [facility] OR;  Service: Ophthalmology;  Laterality: Right;   PHOTOCOAGULATION WITH LASER Right 05/19/2022   Procedure: PHOTOCOAGULATION WITH LASER;  Surgeon: Rennis Chris, MD;  Location: [facility] OR;  Service: Ophthalmology;  Laterality: Right;   WISDOM TOOTH EXTRACTION        Mardella Layman, MD 03/29/23 1130

## 2023-04-06 ENCOUNTER — Encounter: Payer: Self-pay | Admitting: Internal Medicine

## 2023-04-06 DIAGNOSIS — B0239 Other herpes zoster eye disease: Secondary | ICD-10-CM | POA: Diagnosis not present

## 2023-04-06 DIAGNOSIS — B0233 Zoster keratitis: Secondary | ICD-10-CM | POA: Diagnosis not present

## 2023-04-06 LAB — HM DIABETES EYE EXAM

## 2023-04-11 ENCOUNTER — Ambulatory Visit (HOSPITAL_COMMUNITY)
Admission: EM | Admit: 2023-04-11 | Discharge: 2023-04-11 | Disposition: A | Discharge status: Home / Self Care | Payer: Medicare Other | Attending: Emergency Medicine | Admitting: Emergency Medicine

## 2023-04-11 ENCOUNTER — Other Ambulatory Visit: Payer: Self-pay

## 2023-04-11 ENCOUNTER — Encounter (HOSPITAL_COMMUNITY): Payer: Self-pay | Admitting: Emergency Medicine

## 2023-04-11 DIAGNOSIS — B0229 Other postherpetic nervous system involvement: Secondary | ICD-10-CM

## 2023-04-11 MED ORDER — GABAPENTIN 300 MG PO CAPS: 300.0000 mg | ORAL_CAPSULE | ORAL | 0 refills | Status: AC

## 2023-04-11 NOTE — ED Triage Notes (Signed)
Headache Sunday morning and then reoccurred last night(after midnight). Burning pain in left eye/nose and then spread to left side of head.   Patient reports sharp, piercing pain in left side of head.    No photosensitivity.    Patient reports he is recovering from shingles involving left side of face ( seen at this location).    Saw eye doctor and did get steroid eye drops.

## 2023-04-11 NOTE — Discharge Instructions (Addendum)
Please take the gabapentin as prescribed   Day one: 300 mg, once Day two: 300 mg, twice daily (total of 600 mg) Day three: 300 mg, three times daily (total of 900 mg) Then continue taking 3 times daily until you can follow up with your primary care provider.  If you cannot get in with your provider, scan the QR code on the last page to get established with someone new

## 2023-04-11 NOTE — ED Provider Notes (Signed)
[facility]    CSN: [id number] Arrival date & time: 04/11/23  1014     History   Chief Complaint Chief Complaint  Patient presents with   Headache    HPI Bill [name] is a 42 y.o. male.  Last night developed burning pain across left forehead, including around eye and nose. Sharp piercing pain.  Was occurring every 2 hours. Right now he is not having the symptom. Was seen here 2 weeks ago and dx with herpes zoster of this area, prescribed valtrex.  Seen by ophthalmology on 10/24, given another course of valtrex and prednisone eye drops to use QID. Also erythromycin ointment for the skin. Has follow up appointment in a few days.   Denies vision changes, dizziness, weakness, neck pain or stiffness  Hx diabetic retinopathy, CVA, HTN, ESRD post transplant  Followed by [facility]  Past Medical History:  Diagnosis Date   Anemia    Anxiety    situational   Diabetes mellitus without complication (HCC)    type 2   Diabetic retinopathy (HCC)    PDR OU   ESRD on dialysis (HCC)    living unrelated donor renal transplant 11/05/20   History of blood transfusion    Hypertension    PONV (postoperative nausea and vomiting)    03/22/2019- only 15 years ago   Renal disorder    Stroke [facility]) 2019   vision    Patient Active Problem List   Diagnosis Date Noted   Hyperlipidemia associated with type 2 diabetes mellitus (HCC) 10/17/2018   Perinephric hematoma/Rt Sided Post Renal Biopsy 10/13/2018   AKI (acute kidney injury) (HCC) 10/13/2018   Acute on chronic blood loss anemia/ Post Renal Biopsy 10/13/2018   Diabetes 1.5, 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   HTN (hypertension) 10/13/2018   (HFpEF) heart failure with preserved ejection fraction (HCC) 10/13/2018   Anemia 10/06/2018   Acute CVA (cerebrovascular accident) (HCC) 01/25/2018   Hypertensive emergency 01/25/2018   Acute non intractable tension-type headache 01/25/2018   Type 2 diabetes mellitus with vascular  disease (HCC) 01/25/2018    Past Surgical History:  Procedure Laterality Date   A/V FISTULAGRAM N/A 03/11/2019   Procedure: A/V FISTULAGRAM - Left Arm;  Surgeon: Maeola Harman, MD;  Location: [facility] INVASIVE CV LAB;  Service: Cardiovascular;  Laterality: N/A;   arm surgery Right    Fracture   AV FISTULA PLACEMENT Left 01/25/2019   Procedure: ARTERIOVENOUS (AV) FISTULA CREATION  LEFT UPPER  ARM;  Surgeon: Nada Libman, MD;  Location: [facility] OR;  Service: Vascular;  Laterality: Left;   BASCILIC VEIN TRANSPOSITION Left 03/27/2019   Procedure: SECOND STAGE BASILIC VEIN TRANSPOSITION LEFT ARM;  Surgeon: Nada Libman, MD;  Location: [facility] OR;  Service: Vascular;  Laterality: Left;   KIDNEY TRANSPLANT  11/05/2020   [facility]   MEMBRANE PEEL Right 05/19/2022   Procedure: MEMBRANE PEEL;  Surgeon: Rennis Chris, MD;  Location: [facility] OR;  Service: Ophthalmology;  Laterality: Right;   PARS PLANA VITRECTOMY Right 05/19/2022   Procedure: TWENTY FIVE GAUGE PARS PLANA VITRECTOMY;  Surgeon: Rennis Chris, MD;  Location: [facility] OR;  Service: Ophthalmology;  Laterality: Right;   PHOTOCOAGULATION WITH LASER Right 05/19/2022   Procedure: PHOTOCOAGULATION WITH LASER;  Surgeon: Rennis Chris, MD;  Location: [facility] OR;  Service: Ophthalmology;  Laterality: Right;   WISDOM TOOTH EXTRACTION         Home Medications    Prior to Admission medications   Medication Sig Start Date  End Date Taking? Authorizing Provider  gabapentin (NEURONTIN) 300 MG capsule Take 1 capsule (300 mg total) by mouth as directed. Take 300 mg daily for 1 day, then 300 mg twice daily for 1 day, then increase to 300 mg three times daily on day 3. Continue three times daily 04/11/23  Yes Kasaundra Fahrney, PA-C  atorvastatin (LIPITOR) 10 MG tablet TAKE 1 TABLET BY MOUTH EVERY DAY 05/04/21   Philip Aspen, Limmie Patricia, MD  brimonidine [facility]) 0.2 % ophthalmic solution Place 1 drop into the right eye 2 (two) times daily. 09/20/22   Rennis Chris, MD  cholecalciferol (VITAMIN D-400) 10 MCG (400 UNIT) TABS tablet Take 400 Units by mouth daily.    [provider]  Continuous Glucose Sensor (FREESTYLE LIBRE 2 SENSOR) MISC Inject 1 sensor to the skin every 14 days for continuous glucose monitoring. 02/16/23   Philip Aspen, Limmie Patricia, MD  dorzolamide-timolol (COSOPT) 2-0.5 % ophthalmic solution Place 1 drop into the right eye 2 (two) times daily. 10/19/22   Rennis Chris, MD  fluticasone [facility]) 50 MCG/ACT nasal spray Place 1 spray into both nostrils daily as needed for allergies or rhinitis.    [provider]  LANTUS SOLOSTAR 100 UNIT/ML Solostar Pen Inject 18 Units into the skin nightly. 11/21/22   Philip Aspen, Limmie Patricia, MD  mycophenolate (MYFORTIC) 180 MG EC tablet Take 720 mg by mouth 2 (two) times daily. 01/25/22   [provider]  prednisoLONE acetate (PRED FORTE) 1 % ophthalmic suspension Place 1 drop into the right eye 4 (four) times daily. 05/26/22   Rennis Chris, MD  predniSONE (DELTASONE) 5 MG tablet Take 5 mg by mouth daily with breakfast.    [provider]  sertraline (ZOLOFT) 50 MG tablet TAKE 1 TABLET BY MOUTH EVERY DAY 02/20/23   Philip Aspen, Limmie Patricia, MD  sulfamethoxazole-trimethoprim (BACTRIM) 400-80 MG tablet Take 1 tablet by mouth every Monday, Wednesday, and Friday.    [provider]  tacrolimus (PROGRAF) 1 MG capsule Take 3-4 mg by mouth See admin instructions. 4 mg in the morning, 3 mg in the evening 10/21/21   [provider]  valACYclovir (VALTREX) 1000 MG tablet Take 1 tablet (1,000 mg total) by mouth 3 (three) times daily. 03/28/23   Mardella Layman, MD    Family History Family History  Problem Relation Age of Onset   Diabetes Mellitus II Mother    Hypertension Mother    Cataracts Mother    Diabetes Mellitus II Father    Stroke Father    Hypertension Father    Glaucoma Maternal Grandmother    Amblyopia Neg Hx    Blindness Neg Hx    Macular  degeneration Neg Hx    Retinal detachment Neg Hx    Strabismus Neg Hx    Retinitis pigmentosa Neg Hx     Social History Social History   Tobacco Use   Smoking status: Never   Smokeless tobacco: Never  Vaping Use   Vaping status: Never Used  Substance Use Topics   Alcohol use: Not Currently   Drug use: Not Currently     Allergies   Food   Review of Systems Review of Systems Per HPI  Physical Exam Triage Vital Signs ED Triage Vitals [04/11/23 1031]  Encounter Vitals Group     BP      Systolic BP Percentile      Diastolic BP Percentile      Pulse      Resp  Temp      Temp src      SpO2      Weight      Height      Head Circumference      Peak Flow      Pain Score 10     Pain Loc      Pain Education      Exclude from Growth Chart    No data found.  Updated Vital Signs BP (!) 147/76 (BP Location: Right Arm)   Pulse 91   Temp 98.3 F (36.8 C) (Oral)   Resp 18   SpO2 96%    Physical Exam Vitals and nursing note reviewed.  Constitutional:      General: He is not in acute distress.    Appearance: He is not ill-appearing.  HENT:     Head: Atraumatic.  Eyes:     Extraocular Movements: Extraocular movements intact.     Conjunctiva/sclera: Conjunctivae normal.     Pupils: Pupils are equal, round, and reactive to light.  Cardiovascular:     Rate and Rhythm: Normal rate and regular rhythm.     Heart sounds: Normal heart sounds.  Pulmonary:     Effort: Pulmonary effort is normal.     Breath sounds: Normal breath sounds.  Musculoskeletal:     Cervical back: Normal range of motion. No rigidity.  Skin:    Findings: Rash present.     Comments: Crusted over lesions on the left face. No active lesions or vesicles noted   Neurological:     General: No focal deficit present.     Mental Status: He is alert and oriented to person, place, and time.     Cranial Nerves: No cranial nerve deficit.     Motor: No weakness.     Gait: Gait normal.     UC  Treatments / Results  Labs (all labs ordered are listed, but only abnormal results are displayed) Labs Reviewed - No data to display  EKG   Radiology No results found.  Procedures Procedures (including critical care time)  Medications Ordered in UC Medications - No data to display  Initial Impression / Assessment and Plan / UC Course  I have reviewed the triage vital signs and the nursing notes.  Pertinent labs & imaging results that were available during my care of the patient were reviewed by me and considered in my medical decision making (see chart for details).  Suspect acute herpetic neuralgia, occurring within 30 days of initial zoster outbreak.  Creatinine clearance calculated to be 57 mL/min, no dose adjustment needed. Gabapentin 300 mg once today, 300 mg BID tomorrow, then 300 mg TID until follow up with PCP. ED precautions discussed. Patient agreeable to plan, no questions at this time   Final Clinical Impressions(s) / UC Diagnoses   Final diagnoses:  Post herpetic neuralgia     Discharge Instructions      Please take the gabapentin as prescribed   Day one: 300 mg, once Day two: 300 mg, twice daily (total of 600 mg) Day three: 300 mg, three times daily (total of 900 mg) Then continue taking 3 times daily until you can follow up with your primary care provider.  If you cannot get in with your provider, scan the QR code on the last page to get established with someone new     ED Prescriptions     Medication Sig Dispense Auth. Provider   gabapentin (NEURONTIN) 300 MG capsule Take 1 capsule (  300 mg total) by mouth as directed. Take 300 mg daily for 1 day, then 300 mg twice daily for 1 day, then increase to 300 mg three times daily on day 3. Continue three times daily 60 capsule Turkessa Ostrom, Lurena Joiner, PA-C      PDMP not reviewed this encounter.   Collan Schoenfeld, Lurena Joiner, [place] 04/11/23 1108

## 2023-05-02 ENCOUNTER — Encounter: Payer: Medicare Other | Admitting: Family Medicine

## 2023-05-02 NOTE — Progress Notes (Unsigned)
[place]

## 2023-05-04 NOTE — Progress Notes (Shared)
[facility] - Clinic Note  05/15/2023     CHIEF COMPLAINT Patient presents for No chief complaint on file.   HISTORY OF PRESENT ILLNESS: Bill [name] is a 42 y.o. male who presents to the clinic today for:   Pt states his vision seems the same, he has cataract sx OD scheduled for September 24  Referring physician: Philip Aspen, Limmie Patricia, MD [address] [place],  [place] [address]  HISTORICAL INFORMATION:   Selected notes from the MEDICAL RECORD NUMBER Referred by Dr. Jorje Guild for concern of severe NPDR / PDR OU   CURRENT MEDICATIONS: Current Outpatient Medications (Ophthalmic Drugs)  Medication Sig   brimonidine (ALPHAGAN) 0.2 % ophthalmic solution Place 1 drop into the right eye 2 (two) times daily.   dorzolamide-timolol (COSOPT) 2-0.5 % ophthalmic solution Place 1 drop into the right eye 2 (two) times daily.   prednisoLONE acetate (PRED FORTE) 1 % ophthalmic suspension Place 1 drop into the right eye 4 (four) times daily.   No current facility-administered medications for this visit. (Ophthalmic Drugs)   Current Outpatient Medications (Other)  Medication Sig   atorvastatin (LIPITOR) 10 MG tablet TAKE 1 TABLET BY MOUTH EVERY DAY   cholecalciferol (VITAMIN D-400) 10 MCG (400 UNIT) TABS tablet Take 400 Units by mouth daily.   Continuous Glucose Sensor (FREESTYLE LIBRE 2 SENSOR) MISC Inject 1 sensor to the skin every 14 days for continuous glucose monitoring.   fluticasone (FLONASE) 50 MCG/ACT nasal spray Place 1 spray into both nostrils daily as needed for allergies or rhinitis.   gabapentin (NEURONTIN) 300 MG capsule Take 1 capsule (300 mg total) by mouth as directed. Take 300 mg daily for 1 day, then 300 mg twice daily for 1 day, then increase to 300 mg three times daily on day 3. Continue three times daily   LANTUS SOLOSTAR 100 UNIT/ML Solostar Pen Inject 18 Units into the skin nightly.   mycophenolate (MYFORTIC) 180 MG EC tablet Take 720  mg by mouth 2 (two) times daily.   predniSONE (DELTASONE) 5 MG tablet Take 5 mg by mouth daily with breakfast.   sertraline (ZOLOFT) 50 MG tablet TAKE 1 TABLET BY MOUTH EVERY DAY   sulfamethoxazole-trimethoprim (BACTRIM) 400-80 MG tablet Take 1 tablet by mouth every Monday, Wednesday, and Friday.   tacrolimus (PROGRAF) 1 MG capsule Take 3-4 mg by mouth See admin instructions. 4 mg in the morning, 3 mg in the evening   valACYclovir (VALTREX) 1000 MG tablet Take 1 tablet (1,000 mg total) by mouth 3 (three) times daily.   No current facility-administered medications for this visit. (Other)   REVIEW OF SYSTEMS:   ALLERGIES Allergies  Allergen Reactions   Food     Melons-itchy/headaches   PAST MEDICAL HISTORY Past Medical History:  Diagnosis Date   Anemia    Anxiety    situational   Diabetes mellitus without complication (HCC)    type 2   Diabetic retinopathy (HCC)    PDR OU   ESRD on dialysis (HCC)    living unrelated donor renal transplant 11/05/20   History of blood transfusion    Hypertension    PONV (postoperative nausea and vomiting)    03/22/2019- only 15 years ago   Renal disorder    Stroke [facility]) 2019   vision   Past Surgical History:  Procedure Laterality Date   A/V FISTULAGRAM N/A 03/11/2019   Procedure: A/V FISTULAGRAM - Left Arm;  Surgeon: Maeola Harman, MD;  [facility] INVASIVE CV LAB;  Service: Cardiovascular;  Laterality: N/A;   arm surgery Right    Fracture   AV FISTULA PLACEMENT Left 01/25/2019   Procedure: ARTERIOVENOUS (AV) FISTULA CREATION  LEFT UPPER  ARM;  Surgeon: Nada Libman, MD;  Location: [facility] OR;  Service: Vascular;  Laterality: Left;   BASCILIC VEIN TRANSPOSITION Left 03/27/2019   Procedure: SECOND STAGE BASILIC VEIN TRANSPOSITION LEFT ARM;  Surgeon: Nada Libman, MD;  Location: [facility] OR;  Service: Vascular;  Laterality: Left;   KIDNEY TRANSPLANT  11/05/2020   [facility]   MEMBRANE PEEL Right 05/19/2022   Procedure:  MEMBRANE PEEL;  Surgeon: Rennis Chris, MD;  Location: [facility] OR;  Service: Ophthalmology;  Laterality: Right;   PARS PLANA VITRECTOMY Right 05/19/2022   Procedure: TWENTY FIVE GAUGE PARS PLANA VITRECTOMY;  Surgeon: Rennis Chris, MD;  Location: [facility] OR;  Service: Ophthalmology;  Laterality: Right;   PHOTOCOAGULATION WITH LASER Right 05/19/2022   Procedure: PHOTOCOAGULATION WITH LASER;  Surgeon: Rennis Chris, MD;  Location: [facility] OR;  Service: Ophthalmology;  Laterality: Right;   WISDOM TOOTH EXTRACTION     FAMILY HISTORY Family History  Problem Relation Age of Onset   Diabetes Mellitus II Mother    Hypertension Mother    Cataracts Mother    Diabetes Mellitus II Father    Stroke Father    Hypertension Father    Glaucoma Maternal Grandmother    Amblyopia Neg Hx    Blindness Neg Hx    Macular degeneration Neg Hx    Retinal detachment Neg Hx    Strabismus Neg Hx    Retinitis pigmentosa Neg Hx    SOCIAL HISTORY Social History   Tobacco Use   Smoking status: Never   Smokeless tobacco: Never  Vaping Use   Vaping status: Never Used  Substance Use Topics   Alcohol use: Not Currently   Drug use: Not Currently       OPHTHALMIC EXAM:  Not recorded    IMAGING AND PROCEDURES  Imaging and Procedures for @TODAY @          ASSESSMENT/PLAN:   ICD-10-CM   1. Proliferative diabetic retinopathy of both eyes with macular edema associated with type 2 diabetes mellitus (HCC)  K44.0102     2. Vitreous hemorrhage of both eyes (HCC)  H43.13     3. Essential hypertension  I10     4. Hypertensive retinopathy of both eyes  H35.033     5. Acute CVA (cerebrovascular accident) (HCC)  I63.9     6. Ocular hypertension, bilateral  H40.053     7. 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 Pseudophakia  Z96.1      1,2. Proliferative diabetic retinopathy OU  - delayed f/u -- 11 wks instead of 8-10  - last A1c was 10.5 on 12.21.23; 11.9 on 05.11.23; 9.6 on 11.29.22; 7.8  on 7.20.22; 7.2 on 01.13.22  - s/p PRP OD (10.02.19), (01.22.20), (02.12.20), (03.08.22)  - s/p PRP OS (05/02/2018), (02.19.20), (2.17.22)  - s/p IVK #1 OD (01.22.20), OS (02.12.20)  - s/p IVA OD #1 (10.21.20), #2 (12.02.20), #3 (01.13.21), #4 (02.10.21), #5 (04.19.21), #6 (05.21.21), #7 (06.21.21), #8 (08.19.21), #9 (10.11.21), #10 (12.01.21), #11 (01.25.22), #12 (03.01.22), #13 (04.05.22), #14 (6.6.22), #15 (09.22.22), #16 (03.28.23), #17 (08.04.23), #18 (11.01.23 for [facility]), #19 (11.29.23), #20 (01.02.23), #21 (02.12.24), #22 (04.09.24), #23 (06.24.24), #24 (09.09.24)  - s/p IVA OS #1 (12.02.20), #2 (04.19.21), #3 (05.21.21), #4 (06.21.21), #5 (  08.19.21), #6 (10.11.21), #7 (12.01.21), #8 (01.25.22), #9 (03.01.22), #10 (04.05.22), #11 (6.6.22), #12 (09.22.22), #13 (12.04.23), #14 (01.02.23), #15 (02.12.24), #16 (04.09.24), #17 (06.24.24), #18 (09.09.24)  - FA 8.19.19 shows leaking MA, patches of capillary nonperfusion OU  - repeat FA 10.11.21 shows persistent NV OU, large patches of vascular nonperfusion OU (OS>OD) -- fill in PRP completed OU (OS  2.17.22, OD 3.8.22)  - repeat FA 9.13.22 shows interval improvement in NV/leakage  - pre-VH BCVA OD: 20/40  - s/p PPV/MP OD, 12.07.2023             - doing well   - BCVA 20/50 OD -- decreased (cataract)             - history of steroid response -- IOP elevated to 31+ prior -- improved to 11 today  - cont Cosopt and Brim BID OD             - post op drop instructions reviewed   - OCT shows OD: Mild ERM with sharpening of foveal contour and mild pucker, persistent cystic changes superior macula -- slightly improved, stable improvement in vitreous opacities and tractional fibrosis; OS: stable improvement in vitreous opacities at 11 weeks  - recommend IVA OU (OD #25 and OS #19) today, 12.02.24 with follow up in 12 weeks  - pt wishes to proceed with injections  - RBA of procedure discussed, questions answered - informed consent obtained and signed - see  procedure note - f/u 12 weeks -- DFE/OCT, possible injxns -- sign new consent  3,4. 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 Jorje Guild in mid August 2019 with a 2 wk history intractable headaches and decreased vision OU  - found to have BP of 170s/90s -- sent to ED and was admitted to [facility] where SBP was up to 200  - discussed importance of tight BP control   - pt reports compliance with BP meds and BP has improved  5. 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 Limited 2 sequence MRI head: Acute RIGHT periatrial subcentimeter  nonhemorrhagic infarct, in region of optic tract.  - could be limiting field of vision-will check formal VF once edema improves  6. Ocular Hypertention OU  - today, IOP 11 OU  - continue cosopt and brimonidine bid OU  7. Mixed cataract OD - The symptoms of cataract, surgical options, and treatments and risks were discussed with patient. - discussed diagnosis and progression - surgery scheduled for March 07, 2023 with Dr. Zenaida Niece  8. Pseudophakia OS  - s/p CE/IOL (07.29.21, Dr. Alben Spittle)  - IOL in good position, doing well - continue to monitor  Ophthalmic Meds Ordered this visit:  No orders of the defined types were placed in this encounter.    No follow-ups on file.  There are no Patient Instructions on file for this visit.   This document serves as a record of services personally performed by Karie Chimera, MD, PhD. It was created on their behalf by Glee Arvin. Manson Passey, OA an ophthalmic technician. The creation of this record is the provider's dictation and/or activities during the visit.    Electronically signed by: Glee Arvin. Manson Passey, OA 05/04/23 1:39 PM   Karie Chimera, M.D., Ph.D. Diseases & Surgery of the Retina and Vitreous [facility]    Abbreviations: M myopia (nearsighted); A astigmatism; H hyperopia (farsighted); P presbyopia; Mrx  spectacle prescription;  CTL contact lenses; OD right eye; OS left eye; OU both eyes  XT exotropia; ET esotropia; PEK punctate epithelial keratitis; PEE punctate epithelial erosions; DES dry eye syndrome; MGD meibomian gland dysfunction; ATs artificial tears; PFAT's preservative free artificial tears; NSC nuclear sclerotic cataract; PSC posterior subcapsular cataract; ERM epi-retinal membrane; PVD posterior vitreous detachment; RD retinal detachment; DM diabetes mellitus; DR diabetic retinopathy; NPDR non-proliferative diabetic retinopathy; PDR proliferative diabetic retinopathy; CSME clinically significant macular edema; DME diabetic macular edema; dbh dot blot hemorrhages; CWS cotton wool spot; POAG primary open angle glaucoma; C/D cup-to-disc ratio; HVF humphrey visual field; GVF goldmann visual field; OCT optical coherence tomography; IOP intraocular pressure; BRVO Branch retinal vein occlusion; CRVO central retinal vein occlusion; CRAO central retinal artery occlusion; BRAO branch retinal artery occlusion; RT retinal tear; SB scleral buckle; PPV pars plana vitrectomy; VH Vitreous hemorrhage; PRP panretinal laser photocoagulation; IVK intravitreal kenalog; VMT vitreomacular traction; MH Macular hole;  NVD neovascularization of the disc; NVE neovascularization elsewhere; AREDS age related eye disease study; ARMD age related macular degeneration; POAG primary open angle glaucoma; EBMD epithelial/anterior basement membrane dystrophy; ACIOL anterior chamber intraocular lens; IOL intraocular lens; PCIOL posterior chamber intraocular lens; Phaco/IOL phacoemulsification with intraocular lens placement; PRK photorefractive keratectomy; LASIK laser assisted in situ keratomileusis; HTN hypertension; DM diabetes mellitus; COPD chronic obstructive pulmonary disease

## 2023-05-15 ENCOUNTER — Encounter (INDEPENDENT_AMBULATORY_CARE_PROVIDER_SITE_OTHER): Payer: Medicare Other | Admitting: Ophthalmology

## 2023-05-15 DIAGNOSIS — H40053 Ocular hypertension, bilateral: Secondary | ICD-10-CM

## 2023-05-15 DIAGNOSIS — H35033 Hypertensive retinopathy, bilateral: Secondary | ICD-10-CM

## 2023-05-15 DIAGNOSIS — E113513 Type 2 diabetes mellitus with proliferative diabetic retinopathy with macular edema, bilateral: Secondary | ICD-10-CM

## 2023-05-15 DIAGNOSIS — H25811 Combined forms of age-related cataract, right eye: Secondary | ICD-10-CM

## 2023-05-15 DIAGNOSIS — I639 Cerebral infarction, unspecified: Secondary | ICD-10-CM

## 2023-05-15 DIAGNOSIS — I10 Essential (primary) hypertension: Secondary | ICD-10-CM

## 2023-05-15 DIAGNOSIS — H4313 Vitreous hemorrhage, bilateral: Secondary | ICD-10-CM

## 2023-05-15 DIAGNOSIS — Z961 Presence of intraocular lens: Secondary | ICD-10-CM

## 2023-06-08 ENCOUNTER — Telehealth: Payer: Medicare Other | Admitting: Family Medicine

## 2023-06-08 DIAGNOSIS — Z0000 Encounter for general adult medical examination without abnormal findings: Secondary | ICD-10-CM | POA: Diagnosis not present

## 2023-06-08 NOTE — Progress Notes (Signed)
PATIENT CHECK-IN and HEALTH RISK ASSESSMENT QUESTIONNAIRE:  -completed by phone/video for upcoming Medicare Preventive Visit   Pre-Visit Check-in: 1)Vitals (height, wt, BP, etc) - record in vitals section for visit on day of visit Request home vitals (wt, BP, etc.) and enter into vitals, THEN update Vital Signs SmartPhrase below at the top of the HPI. See below.  2)Review and Update Medications, Allergies PMH, Surgeries, Social history in Epic 3)Hospitalizations in the last year with date/reason? no  4)Review and Update Care Team (patient's specialists) in Epic 5) Complete PHQ9 in Epic  6) Complete Fall Screening in Epic 7)Review all Health Maintenance Due and order under PCP if not done.  Medicare Wellness Patient Questionnaire:  Answer theses question about your habits: How often do you have a drink containing alcohol? Once per month How many drinks containing alcohol do you have on a typical day when you are drinking? Usually 2 How often do you have six or more drinks on one occasion? no Have you ever smoked? no How many packs a day do/did you smoke? no Do you use smokeless tobacco?no Do you use an illicit drugs? no On average, how many days per week do you engage in moderate to strenuous exercise (like a brisk walk)? Has a trainer - usually goes twice a week Are you sexually active?  Yes, but not m Typical  diet: reports diet is not great - he wants to try to   Answer theses question about your everyday activities: Can you perform most household chores? yes Are you deaf or have significant trouble hearing? no Do you feel that you have a problem with memory? no Do you feel safe at home? yes Last dentist visit? Has been quite some time 8. Do you have any difficulty performing your everyday activities?n Are you having any difficulty walking, taking medications on your own, and or difficulty managing daily home needs?n Do you have difficulty walking or climbing stairs?n Do you  have difficulty dressing or bathing?n Do you have difficulty doing errands alone such as visiting a doctor's office or shopping?n Do you currently have any difficulty preparing food and eating?n Do you currently have any difficulty using the toilet?n Do you have any difficulty managing your finances?n Do you have any difficulties with housekeeping of managing your housekeeping?n   Do you have Advanced Directives in place (Living Will, Healthcare Power or Attorney)? no   Last eye Exam and location?  Saw him in September   Do you currently use prescribed or non-prescribed narcotic or opioid pain medications? no       ----------------------------------------------------------------------------------------------------------------------------------------------------------------------------------------------------------------------  Because this visit was a virtual/telehealth visit, some criteria may be missing or patient reported. Any vitals not documented were not able to be obtained and vitals that have been documented are patient reported.    MEDICARE ANNUAL PREVENTIVE CARE VISIT WITH PROVIDER (Welcome to Medicare, initial annual wellness or annual wellness exam)  Virtual Visit via Phone Note  I connected with Bill [name] on 06/08/23  by phone and verified that I am speaking with the correct person using two identifiers.  Location patient: home Location provider:work or home office Persons participating in the virtual visit: patient, provider  Concerns and/or follow up today: reports is doing well, no concerns   See HM section in Epic for other details of completed HM.    ROS: negative for report of fevers, unintentional weight loss, vision changes, vision loss, hearing loss or change, chest pain, sob, hemoptysis, melena, hematochezia, hematuria, falls, bleeding  or bruising, thoughts of suicide or self harm, memory loss  Patient-completed extensive health risk assessment  - reviewed and discussed with the patient: See Health Risk Assessment completed with patient prior to the visit either above or in recent phone note. This was reviewed in detailed with the patient today and appropriate recommendations, orders and referrals were placed as needed per Summary below and patient instructions.   Review of Medical History: -PMH, PSH, Family History and current specialty and care providers reviewed and updated and listed below   Patient Care Team: Philip Aspen, Limmie Patricia, MD as PCP - General (Internal Medicine) Philip Aspen, Limmie Patricia, MD (Internal Medicine) [facility]   Past Medical History:  Diagnosis Date   Anemia    Anxiety    situational   Diabetes mellitus without complication [facility])    type 2   Diabetic retinopathy (HCC)    PDR OU   ESRD on dialysis (HCC)    living unrelated donor renal transplant 11/05/20   History of blood transfusion    Hypertension    PONV (postoperative nausea and vomiting)    03/22/2019- only 15 years ago   Renal disorder    Stroke [facility]) 2019   vision    Past Surgical History:  Procedure Laterality Date   A/V FISTULAGRAM N/A 03/11/2019   Procedure: A/V FISTULAGRAM - Left Arm;  Surgeon: Maeola Harman, MD;  Location: [facility] INVASIVE CV LAB;  Service: Cardiovascular;  Laterality: N/A;   arm surgery Right    Fracture   AV FISTULA PLACEMENT Left 01/25/2019   Procedure: ARTERIOVENOUS (AV) FISTULA CREATION  LEFT UPPER  ARM;  Surgeon: Nada Libman, MD;  Location: [facility] OR;  Service: Vascular;  Laterality: Left;   BASCILIC VEIN TRANSPOSITION Left 03/27/2019   Procedure: SECOND STAGE BASILIC VEIN TRANSPOSITION LEFT ARM;  Surgeon: Nada Libman, MD;  Location: [facility] OR;  Service: Vascular;  Laterality: Left;   KIDNEY TRANSPLANT  11/05/2020   [facility]   MEMBRANE PEEL Right 05/19/2022   Procedure: MEMBRANE PEEL;  Surgeon: Rennis Chris, MD;  Location: [facility] OR;  Service: Ophthalmology;  Laterality:  Right;   PARS PLANA VITRECTOMY Right 05/19/2022   Procedure: TWENTY FIVE GAUGE PARS PLANA VITRECTOMY;  Surgeon: Rennis Chris, MD;  Location[facility] OR;  Service: Ophthalmology;  Laterality: Right;   PHOTOCOAGULATION WITH LASER Right 05/19/2022   Procedure: PHOTOCOAGULATION WITH LASER;  Surgeon: Rennis Chris, MD;  Location: [facility] OR;  Service: Ophthalmology;  Laterality: Right;   WISDOM TOOTH EXTRACTION      Social History   Socioeconomic History   Marital status: Married    Spouse name: Not on file   Number of children: Not on file   Years of education: Not on file   Highest education level: Not on file  Occupational History   Not on file  Tobacco Use   Smoking status: Never   Smokeless tobacco: Never  Vaping Use   Vaping status: Never Used  Substance and Sexual Activity   Alcohol use: Not Currently   Drug use: Not Currently   Sexual activity: Yes  Other Topics Concern   Not on file  Social History Narrative   ** Merged History Encounter **       Social Drivers of Health   Financial Resource Strain: Not on file  Food Insecurity: Not on file  Transportation Needs: Not on file  Physical Activity: Not on file  Stress: Not on file  Social Connections: Not on file  Intimate  Partner Violence: Not on file    Family History  Problem Relation Age of Onset   Diabetes Mellitus II Mother    Hypertension Mother    Cataracts Mother    Diabetes Mellitus II Father    Stroke Father    Hypertension Father    Glaucoma Maternal Grandmother    Amblyopia Neg Hx    Blindness Neg Hx    Macular degeneration Neg Hx    Retinal detachment Neg Hx    Strabismus Neg Hx    Retinitis pigmentosa Neg Hx     Current Outpatient Medications on File Prior to Visit  Medication Sig Dispense Refill   atorvastatin (LIPITOR) 10 MG tablet TAKE 1 TABLET BY MOUTH EVERY DAY 90 tablet 1   brimonidine (ALPHAGAN) 0.2 % ophthalmic solution Place 1 drop into the right eye 2 (two) times daily. 3 mL 11    cholecalciferol (VITAMIN D-400) 10 MCG (400 UNIT) TABS tablet Take 400 Units by mouth daily.     Continuous Glucose Sensor (FREESTYLE LIBRE 2 SENSOR) MISC Inject 1 sensor to the skin every 14 days for continuous glucose monitoring. 6 each 11   dorzolamide-timolol (COSOPT) 2-0.5 % ophthalmic solution Place 1 drop into the right eye 2 (two) times daily. 10 mL 3   fluticasone (FLONASE) 50 MCG/ACT nasal spray Place 1 spray into both nostrils daily as needed for allergies or rhinitis.     gabapentin (NEURONTIN) 300 MG capsule Take 1 capsule (300 mg total) by mouth as directed. Take 300 mg daily for 1 day, then 300 mg twice daily for 1 day, then increase to 300 mg three times daily on day 3. Continue three times daily 60 capsule 0   LANTUS SOLOSTAR 100 UNIT/ML Solostar Pen Inject 18 Units into the skin nightly. 18 mL 5   mycophenolate (MYFORTIC) 180 MG EC tablet Take 720 mg by mouth 2 (two) times daily.     prednisoLONE acetate (PRED FORTE) 1 % ophthalmic suspension Place 1 drop into the right eye 4 (four) times daily. 15 mL 0   predniSONE (DELTASONE) 5 MG tablet Take 5 mg by mouth daily with breakfast.     sertraline (ZOLOFT) 50 MG tablet TAKE 1 TABLET BY MOUTH EVERY DAY 90 tablet 0   sulfamethoxazole-trimethoprim (BACTRIM) 400-80 MG tablet Take 1 tablet by mouth every Monday, Wednesday, and Friday.     tacrolimus (PROGRAF) 1 MG capsule Take 3-4 mg by mouth See admin instructions. 4 mg in the morning, 3 mg in the evening     valACYclovir (VALTREX) 1000 MG tablet Take 1 tablet (1,000 mg total) by mouth 3 (three) times daily. 21 tablet 0   No current facility-administered medications on file prior to visit.    Allergies  Allergen Reactions   Food     Melons-itchy/headaches       Physical Exam Vitals requested from patient and listed below if patient had equipment and was able to obtain at home for this virtual visit: There were no vitals filed for this visit. Estimated body mass index is 26.49  kg/m as calculated from the following:   Height as of 05/24/22: 5\' 10"  (1.778 m).   Weight as of 05/24/22: 184 lb 9.6 oz (83.7 kg).  EKG (optional): deferred due to virtual visit  GENERAL: alert, oriented, no acute distress detected; full vision exam deferred due to pandemic and/or virtual encounter  PSYCH/NEURO: pleasant and cooperative, no obvious depression or anxiety, speech and thought processing grossly intact, Cognitive function grossly intact  Flowsheet Row Office Visit from 05/24/2022 in [facility] [place]  PHQ-9 Total Score 8           06/08/2023    5:50 PM 05/24/2022   10:18 AM 10/28/2020    8:41 AM 06/25/2020    7:36 AM 03/27/2020    9:39 AM  Depression screen PHQ 2/9  Decreased Interest 0 0 0 0 0  Down, Depressed, Hopeless 1 3 0 0 1  PHQ - 2 Score 1 3 0 0 1  Altered sleeping  2 1 0 0  Tired, decreased energy   0 0 0  Change in appetite  0 0 0 1  Feeling bad or failure about yourself   1 0 0 0  Trouble concentrating  0 1 0 0  Moving slowly or fidgety/restless  0 1 0 1  Suicidal thoughts  2 0 0 0  PHQ-9 Score  8 3 0 3  Difficult doing work/chores  Not difficult at all Not difficult at all Not difficult at all Somewhat difficult  Reports seeing therapist and feels is doing ok currently.      03/27/2019    9:15 AM 03/27/2020    9:38 AM 05/19/2022    9:59 AM 05/24/2022   10:19 AM 06/08/2023    5:50 PM  Fall Risk  Falls in the past year?  0  0 0  Was there an injury with Fall?  0  0 0  Fall Risk Category Calculator  0  0 0  Fall Risk Category (Retired)  Low  Low   (RETIRED) Patient Fall Risk Level Low fall risk Low fall risk Low fall risk Low fall risk   Patient at Risk for Falls Due to  No Fall Risks  No Fall Risks   Fall risk Follow up  Falls evaluation completed  Falls evaluation completed      SUMMARY AND PLAN:  Encounter for Medicare annual wellness exam  Discussed applicable health maintenance/preventive health measures  and advised and referred or ordered per patient preferences: -he agrees to call to schedule inperson follow up with Dr. Ardyth Harps and do the labs/foot exam then Health Maintenance  Topic Date Due   Diabetic kidney evaluation - Urine ACR  10/11/2019   FOOT EXAM  06/25/2021   HEMOGLOBIN A1C  11/23/2022   INFLUENZA VACCINE  01/12/2023   COVID-19 Vaccine (5 - 2024-25 season) 02/12/2023   Diabetic kidney evaluation - eGFR measurement  09/24/2023   OPHTHALMOLOGY EXAM  04/05/2024   Medicare Annual Wellness (AWV)  06/07/2024   DTaP/Tdap/Td (2 - Td or Tdap) 05/16/2029   Hepatitis C Screening  Completed   HIV Screening  Completed   HPV VACCINES  Aged [employer] and counseling on the following was provided based on the above review of health and a plan/checklist for the patient, along with additional information discussed, was provided for the patient in the patient instructions :  -Advised on importance of completing advanced directives, discussed options for completing and provided information in patient instructions as well -Advised and counseled on a healthy lifestyle - including the importance of a healthy diet, regular physical activity, social connections and stress management. -Reviewed patient's current diet. Advised and counseled on a whole foods based healthy diet - particularly as it pertains to his chronic disease. A summary of a healthy diet was provided in the Patient Instructions.  -reviewed patient's current physical activity level and discussed exercise guidelines for adults. Discussed community resources  and ideas for safe exercise at home to assist in meeting exercise guideline recommendations in a safe and healthy way.  -Advise yearly dental visits at minimum and regular eye exams -Advised and counseled on alcohol safe limits, risks  Follow up: see patient instructions   Patient Instructions  I really enjoyed getting to talk with you today! I am available on Tuesdays  and Thursdays for virtual visits if you have any questions or concerns, or if I can be of any further assistance.   CHECKLIST FROM ANNUAL WELLNESS VISIT:  -Follow up (please call to schedule if not scheduled after visit):   -yearly for annual wellness visit with primary care office  Here is a list of your preventive care/health maintenance measures and the plan for each if any are due:  PLAN For any measures below that may be due:  -please schedule a visit with Dr. Ardyth Harps for follow up and to get your labs and foot exam -please get your updated flu and covid vaccines at the pharmacy  Health Maintenance  Topic Date Due   Diabetic kidney evaluation - Urine ACR  10/11/2019   FOOT EXAM  06/25/2021   HEMOGLOBIN A1C  11/23/2022   INFLUENZA VACCINE  01/12/2023   COVID-19 Vaccine (5 - 2024-25 season) 02/12/2023   Diabetic kidney evaluation - eGFR measurement  09/24/2023   OPHTHALMOLOGY EXAM  04/05/2024   Medicare Annual Wellness (AWV)  06/07/2024   DTaP/Tdap/Td (2 - Td or Tdap) 05/16/2029   Hepatitis C Screening  Completed   HIV Screening  Completed   HPV VACCINES  Aged Out    -See a dentist at least yearly  -Get your eyes checked and then per your eye specialist's recommendations  -Other issues addressed today:   -I have included below further information regarding a healthy whole foods based diet, physical activity guidelines for adults, stress management and opportunities for social connections. I hope you find this information useful.   -----------------------------------------------------------------------------------------------------------------------------------------------------------------------------------------------------------------------------------------------------------  NUTRITION: -eat real food: lots of colorful vegetables (half the plate) and fruits -5-7 servings of vegetables and fruits per day (fresh or steamed is best), exp. 2 servings of vegetables with  lunch and dinner and 2 servings of fruit per day. Berries and greens such as kale and collards are great choices.  -consume on a regular basis: whole grains (make sure first ingredient on label contains the word "whole"), fresh fruits, fish, nuts, seeds, healthy oils (such as olive oil, avocado oil, grape seed oil) -may eat small amounts of dairy and lean meat on occasion, but avoid processed meats such as ham, bacon, lunch meat, etc. -drink water -try to avoid fast food and pre-packaged foods, processed meat -most experts advise limiting sodium to < 2300mg  per day, should limit further is any chronic conditions such as high blood pressure, heart disease, diabetes, etc. The American Heart Association advised that < 1500mg  is is ideal -try to avoid foods that contain any ingredients with names you do not recognize  -try to avoid sugar/sweets (except for the natural sugar that occurs in fresh fruit) -try to avoid sweet drinks -try to avoid white rice, white bread, pasta (unless whole grain), white or yellow potatoes  EXERCISE GUIDELINES FOR ADULTS: -if you wish to increase your physical activity, do so gradually and with the approval of your doctor -STOP and seek medical care immediately if you have any chest pain, chest discomfort or trouble breathing when starting or increasing exercise  -move and stretch your body, legs, feet and arms  when sitting for long periods -Physical activity guidelines for optimal health in adults: -least 150 minutes per week of aerobic exercise (can talk, but not sing) once approved by your doctor, 20-30 minutes of sustained activity or two 10 minute episodes of sustained activity every day.  -resistance training at least 2 days per week if approved by your doctor -balance exercises 3+ days per week:   Stand somewhere where you have something sturdy to hold onto if you lose balance.    1) lift up on toes, start with 5x per day and work up to 20x   2) stand and lift on  leg straight out to the side so that foot is a few inches of the floor, start with 5x each side and work up to 20x each side   3) stand on one foot, start with 5 seconds each side and work up to 20 seconds on each side  If you need ideas or help with getting more active:  -Silver sneakers https://tools.silversneakers.com  -Walk with a Doc: http://www.duncan-williams.com/  -try to include resistance (weight lifting/strength building) and balance exercises twice per week: or the following link for ideas: http://castillo-powell.com/  BuyDucts.dk  STRESS MANAGEMENT: -can try meditating, or just sitting quietly with deep breathing while intentionally relaxing all parts of your body for 5 minutes daily -if you need further help with stress, anxiety or depression please follow up with your primary doctor or contact the wonderful folks at [facility]: [phone]  SOCIAL CONNECTIONS: -options in [place] if you wish to engage in more social and exercise related activities:  -Silver sneakers https://tools.silversneakers.com  -Walk with a Doc: http://www.duncan-williams.com/  -Check out the [facility] Active Adults 50+ section on the [place] [employer] (hiking clubs, book clubs, cards and games, chess, exercise classes, aquatic classes and much more) - see the website for details: https://www.[place].gov/departments/parks-recreation/active-adults50  -YouTube has lots of exercise videos for different ages and abilities as well  -[facility] (a variety of indoor and outdoor inperson activities for adults). [phone]. [address]  -Virtual Online Classes (a variety of topics): see seniorplanet.org or call [phone]  -consider volunteering at a school, hospice center, church, senior center or elsewhere         ADVANCED HEALTHCARE  DIRECTIVES:  [place] Advanced Directives assistance:   ExpressWeek.com.cy  Everyone should have advanced health care directives in place. This is so that you get the care you want, should you ever be in a situation where you are unable to make your own medical decisions.   From the [place] Advanced Directive Website: "Advance Health Care Directives are legal documents in which you give written instructions about your health care if, in the future, you cannot speak for yourself.   A health care power of attorney allows you to name a person you trust to make your health care decisions if you cannot make them yourself. A declaration of a desire for a natural death (or living will) is document, which states that you desire not to have your life prolonged by extraordinary measures if you have a terminal or incurable illness or if you are in a vegetative state. An advance instruction for mental health treatment makes a declaration of instructions, information and preferences regarding your mental health treatment. It also states that you are aware that the advance instruction authorizes a mental health treatment provider to act according to your wishes. It may also outline your consent or refusal of mental health treatment. A declaration of an  anatomical gift allows anyone over the age of 30 to make a gift by will, organ donor card or other document."   Please see the following website or an elder law attorney for forms, FAQs and for completion of advanced directives: [place] TEFL teacher Health Care Directives Advance Health Care Directives (http://guzman.com/)  Or copy and paste the following to your web browser: PoshChat.fi    Terressa Koyanagi, DO

## 2023-06-08 NOTE — Patient Instructions (Signed)
I really enjoyed getting to talk with you today! I am available on Tuesdays and Thursdays for virtual visits if you have any questions or concerns, or if I can be of any further assistance.   CHECKLIST FROM ANNUAL WELLNESS VISIT:  -Follow up (please call to schedule if not scheduled after visit):   -yearly for annual wellness visit with primary care office  Here is a list of your preventive care/health maintenance measures and the plan for each if any are due:  PLAN For any measures below that may be due:  -please schedule a visit with Dr. Ardyth Harps for follow up and to get your labs and foot exam -please get your updated flu and covid vaccines at the pharmacy  Health Maintenance  Topic Date Due   Diabetic kidney evaluation - Urine ACR  10/11/2019   FOOT EXAM  06/25/2021   HEMOGLOBIN A1C  11/23/2022   INFLUENZA VACCINE  01/12/2023   COVID-19 Vaccine (5 - 2024-25 season) 02/12/2023   Diabetic kidney evaluation - eGFR measurement  09/24/2023   OPHTHALMOLOGY EXAM  04/05/2024   Medicare Annual Wellness (AWV)  06/07/2024   DTaP/Tdap/Td (2 - Td or Tdap) 05/16/2029   Hepatitis C Screening  Completed   HIV Screening  Completed   HPV VACCINES  Aged Out    -See a dentist at least yearly  -Get your eyes checked and then per your eye specialist's recommendations  -Other issues addressed today:   -I have included below further information regarding a healthy whole foods based diet, physical activity guidelines for adults, stress management and opportunities for social connections. I hope you find this information useful.   -----------------------------------------------------------------------------------------------------------------------------------------------------------------------------------------------------------------------------------------------------------  NUTRITION: -eat real food: lots of colorful vegetables (half the plate) and fruits -5-7 servings of vegetables and  fruits per day (fresh or steamed is best), exp. 2 servings of vegetables with lunch and dinner and 2 servings of fruit per day. Berries and greens such as kale and collards are great choices.  -consume on a regular basis: whole grains (make sure first ingredient on label contains the word "whole"), fresh fruits, fish, nuts, seeds, healthy oils (such as olive oil, avocado oil, grape seed oil) -may eat small amounts of dairy and lean meat on occasion, but avoid processed meats such as ham, bacon, lunch meat, etc. -drink water -try to avoid fast food and pre-packaged foods, processed meat -most experts advise limiting sodium to < 2300mg  per day, should limit further is any chronic conditions such as high blood pressure, heart disease, diabetes, etc. The American Heart Association advised that < 1500mg  is is ideal -try to avoid foods that contain any ingredients with names you do not recognize  -try to avoid sugar/sweets (except for the natural sugar that occurs in fresh fruit) -try to avoid sweet drinks -try to avoid white rice, white bread, pasta (unless whole grain), white or yellow potatoes  EXERCISE GUIDELINES FOR ADULTS: -if you wish to increase your physical activity, do so gradually and with the approval of your doctor -STOP and seek medical care immediately if you have any chest pain, chest discomfort or trouble breathing when starting or increasing exercise  -move and stretch your body, legs, feet and arms when sitting for long periods -Physical activity guidelines for optimal health in adults: -least 150 minutes per week of aerobic exercise (can talk, but not sing) once approved by your doctor, 20-30 minutes of sustained activity or two 10 minute episodes of sustained activity every day.  -resistance training at least  2 days per week if approved by your doctor -balance exercises 3+ days per week:   Stand somewhere where you have something sturdy to hold onto if you lose balance.    1) lift  up on toes, start with 5x per day and work up to 20x   2) stand and lift on leg straight out to the side so that foot is a few inches of the floor, start with 5x each side and work up to 20x each side   3) stand on one foot, start with 5 seconds each side and work up to 20 seconds on each side  If you need ideas or help with getting more active:  -Silver sneakers https://tools.silversneakers.com  -Walk with a Doc: http://www.duncan-williams.com/  -try to include resistance (weight lifting/strength building) and balance exercises twice per week: or the following link for ideas: http://castillo-powell.com/  BuyDucts.dk  STRESS MANAGEMENT: -can try meditating, or just sitting quietly with deep breathing while intentionally relaxing all parts of your body for 5 minutes daily -if you need further help with stress, anxiety or depression please follow up with your primary doctor or contact the wonderful folks at [facility]: [phone]  SOCIAL CONNECTIONS: -options in [place] if you wish to engage in more social and exercise related activities:  -Silver sneakers https://tools.silversneakers.com  -Walk with a Doc: http://www.duncan-williams.com/  -Check out the [facility] Active Adults 50+ section on the [place] of [employer] (hiking clubs, book clubs, cards and games, chess, exercise classes, aquatic classes and much more) - see the website for details: https://www.[place].gov/departments/parks-recreation/active-adults50  -YouTube has lots of exercise videos for different ages and abilities as well  -[facility] (a variety of indoor and outdoor inperson activities for adults). 214-102-9822. [address]  -Virtual Online Classes (a variety of topics): see seniorplanet.org or call 708-377-2199  -consider volunteering at a school, hospice center, church, senior  center or elsewhere         ADVANCED HEALTHCARE DIRECTIVES:  [place] Advanced Directives assistance:   ExpressWeek.com.cy  Everyone should have advanced health care directives in place. This is so that you get the care you want, should you ever be in a situation where you are unable to make your own medical decisions.   From the [place] Advanced Directive Website: "Advance Health Care Directives are legal documents in which you give written instructions about your health care if, in the future, you cannot speak for yourself.   A health care power of attorney allows you to name a person you trust to make your health care decisions if you cannot make them yourself. A declaration of a desire for a natural death (or living will) is document, which states that you desire not to have your life prolonged by extraordinary measures if you have a terminal or incurable illness or if you are in a vegetative state. An advance instruction for mental health treatment makes a declaration of instructions, information and preferences regarding your mental health treatment. It also states that you are aware that the advance instruction authorizes a mental health treatment provider to act according to your wishes. It may also outline your consent or refusal of mental health treatment. A declaration of an anatomical gift allows anyone over the age of 36 to make a gift by will, organ donor card or other document."   Please see the following website or an elder law attorney for forms, FAQs and for completion of advanced directives: [place] [employer]  Advance Health Care Directives (http://guzman.com/)  Or copy and paste the following to your web browser: PoshChat.fi

## 2023-07-03 ENCOUNTER — Encounter (INDEPENDENT_AMBULATORY_CARE_PROVIDER_SITE_OTHER): Payer: Medicare Other | Admitting: Ophthalmology

## 2023-07-03 DIAGNOSIS — Z961 Presence of intraocular lens: Secondary | ICD-10-CM

## 2023-07-03 DIAGNOSIS — H40053 Ocular hypertension, bilateral: Secondary | ICD-10-CM

## 2023-07-03 DIAGNOSIS — H4313 Vitreous hemorrhage, bilateral: Secondary | ICD-10-CM

## 2023-07-03 DIAGNOSIS — I639 Cerebral infarction, unspecified: Secondary | ICD-10-CM

## 2023-07-03 DIAGNOSIS — H35033 Hypertensive retinopathy, bilateral: Secondary | ICD-10-CM

## 2023-07-03 DIAGNOSIS — I10 Essential (primary) hypertension: Secondary | ICD-10-CM

## 2023-07-03 DIAGNOSIS — H25811 Combined forms of age-related cataract, right eye: Secondary | ICD-10-CM

## 2023-07-03 DIAGNOSIS — E113513 Type 2 diabetes mellitus with proliferative diabetic retinopathy with macular edema, bilateral: Secondary | ICD-10-CM

## 2023-07-05 ENCOUNTER — Encounter (INDEPENDENT_AMBULATORY_CARE_PROVIDER_SITE_OTHER): Payer: Medicare Other | Admitting: Ophthalmology

## 2023-07-05 DIAGNOSIS — H25811 Combined forms of age-related cataract, right eye: Secondary | ICD-10-CM

## 2023-07-05 DIAGNOSIS — H35033 Hypertensive retinopathy, bilateral: Secondary | ICD-10-CM

## 2023-07-05 DIAGNOSIS — H40053 Ocular hypertension, bilateral: Secondary | ICD-10-CM

## 2023-07-05 DIAGNOSIS — H4313 Vitreous hemorrhage, bilateral: Secondary | ICD-10-CM

## 2023-07-05 DIAGNOSIS — E113513 Type 2 diabetes mellitus with proliferative diabetic retinopathy with macular edema, bilateral: Secondary | ICD-10-CM

## 2023-07-05 DIAGNOSIS — I639 Cerebral infarction, unspecified: Secondary | ICD-10-CM

## 2023-07-05 DIAGNOSIS — I10 Essential (primary) hypertension: Secondary | ICD-10-CM

## 2023-07-05 DIAGNOSIS — Z961 Presence of intraocular lens: Secondary | ICD-10-CM

## 2023-09-13 ENCOUNTER — Telehealth: Payer: Self-pay | Admitting: *Deleted

## 2023-09-13 NOTE — Telephone Encounter (Signed)
 Patient was identified as falling into the [place] Measure - Diabetes.   Patient was: Left voicemail to schedule with primary care provider.

## 2023-10-03 ENCOUNTER — Encounter (INDEPENDENT_AMBULATORY_CARE_PROVIDER_SITE_OTHER): Admitting: Ophthalmology

## 2023-10-03 DIAGNOSIS — H35033 Hypertensive retinopathy, bilateral: Secondary | ICD-10-CM

## 2023-10-03 DIAGNOSIS — H40053 Ocular hypertension, bilateral: Secondary | ICD-10-CM

## 2023-10-03 DIAGNOSIS — H4313 Vitreous hemorrhage, bilateral: Secondary | ICD-10-CM

## 2023-10-03 DIAGNOSIS — Z961 Presence of intraocular lens: Secondary | ICD-10-CM

## 2023-10-03 DIAGNOSIS — H25811 Combined forms of age-related cataract, right eye: Secondary | ICD-10-CM

## 2023-10-03 DIAGNOSIS — I10 Essential (primary) hypertension: Secondary | ICD-10-CM

## 2023-10-03 DIAGNOSIS — E113513 Type 2 diabetes mellitus with proliferative diabetic retinopathy with macular edema, bilateral: Secondary | ICD-10-CM

## 2023-10-03 DIAGNOSIS — I639 Cerebral infarction, unspecified: Secondary | ICD-10-CM

## 2023-12-11 ENCOUNTER — Other Ambulatory Visit: Payer: Self-pay | Admitting: Internal Medicine

## 2023-12-11 NOTE — Telephone Encounter (Signed)
 Left message on machine for patient.  He will need an office visit for refills.

## 2024-01-04 NOTE — Progress Notes (Shared)
 [facility] - Clinic Note  01/10/2024     CHIEF COMPLAINT Patient presents for No chief complaint on file.   HISTORY OF PRESENT ILLNESS: Bill [name] is a 43 y.o. male who presents to the clinic today for:   Pt states   Referring physician: Theophilus Andrews, Tully GRADE, MD [address] [place],  [place] [address]  HISTORICAL INFORMATION:   Selected notes from the MEDICAL RECORD NUMBER Referred by Dr. Hallahan for concern of severe NPDR / PDR OU   CURRENT MEDICATIONS: Current Outpatient Medications (Ophthalmic Drugs)  Medication Sig   brimonidine  (ALPHAGAN ) 0.2 % ophthalmic solution Place 1 drop into the right eye 2 (two) times daily.   dorzolamide -timolol  (COSOPT ) 2-0.5 % ophthalmic solution Place 1 drop into the right eye 2 (two) times daily.   prednisoLONE  acetate (PRED FORTE ) 1 % ophthalmic suspension Place 1 drop into the right eye 4 (four) times daily.   No current facility-administered medications for this visit. (Ophthalmic Drugs)   Current Outpatient Medications (Other)  Medication Sig   atorvastatin  (LIPITOR ) 10 MG tablet TAKE 1 TABLET BY MOUTH EVERY DAY   cholecalciferol (VITAMIN D-400) 10 MCG (400 UNIT) TABS tablet Take 400 Units by mouth daily.   Continuous Glucose Sensor (FREESTYLE LIBRE 2 SENSOR) MISC Inject 1 sensor to the skin every 14 days for continuous glucose monitoring.   fluticasone (FLONASE) 50 MCG/ACT nasal spray Place 1 spray into both nostrils daily as needed for allergies or rhinitis.   gabapentin  (NEURONTIN ) 300 MG capsule Take 1 capsule (300 mg total) by mouth as directed. Take 300 mg daily for 1 day, then 300 mg twice daily for 1 day, then increase to 300 mg three times daily on day 3. Continue three times daily   LANTUS SOLOSTAR 100 UNIT/ML Solostar Pen Inject 18 Units into the skin nightly.   mycophenolate (MYFORTIC) 180 MG EC tablet Take 720 mg by mouth 2 (two) times daily.   predniSONE (DELTASONE) 5 MG tablet  Take 5 mg by mouth daily with breakfast.   sertraline  (ZOLOFT ) 50 MG tablet TAKE 1 TABLET BY MOUTH EVERY DAY   sulfamethoxazole-trimethoprim (BACTRIM) 400-80 MG tablet Take 1 tablet by mouth every Monday, Wednesday, and Friday.   tacrolimus  (PROGRAF ) 1 MG capsule Take 3-4 mg by mouth See admin instructions. 4 mg in the morning, 3 mg in the evening   valACYclovir  (VALTREX ) 1000 MG tablet Take 1 tablet (1,000 mg total) by mouth 3 (three) times daily.   No current facility-administered medications for this visit. (Other)   REVIEW OF SYSTEMS:   ALLERGIES Allergies  Allergen Reactions   Food     Melons-itchy/headaches   PAST MEDICAL HISTORY Past Medical History:  Diagnosis Date   Anemia    Anxiety    situational   Diabetes mellitus without complication (HCC)    type 2   Diabetic retinopathy (HCC)    PDR OU   ESRD on dialysis (HCC)    living unrelated donor renal transplant 11/05/20   History of blood transfusion    Hypertension    PONV (postoperative nausea and vomiting)    03/22/2019- only 15 years ago   Renal disorder    Stroke [facility]) 2019   vision   Past Surgical History:  Procedure Laterality Date   A/V FISTULAGRAM N/A 03/11/2019   Procedure: A/V FISTULAGRAM - Left Arm;  Surgeon: Sheree Penne Bruckner, MD;  Location: [facility] INVASIVE CV LAB;  Service: Cardiovascular;  Laterality: N/A;  arm surgery Right    Fracture   AV FISTULA PLACEMENT Left 01/25/2019   Procedure: ARTERIOVENOUS (AV) FISTULA CREATION  LEFT UPPER  ARM;  Surgeon: Serene Gaile ORN, MD;  Location: [facility] OR;  Service: Vascular;  Laterality: Left;   BASCILIC VEIN TRANSPOSITION Left 03/27/2019   Procedure: SECOND STAGE BASILIC VEIN TRANSPOSITION LEFT ARM;  Surgeon: Serene Gaile ORN, MD;  [facility];  Service: Vascular;  Laterality: Left;   KIDNEY TRANSPLANT  11/05/2020   [facility]   MEMBRANE PEEL Right 05/19/2022   Procedure: MEMBRANE PEEL;  Surgeon: Valdemar Rogue, MD;  Location: [facility] OR;  Service:  Ophthalmology;  Laterality: Right;   PARS PLANA VITRECTOMY Right 05/19/2022   Procedure: TWENTY FIVE GAUGE PARS PLANA VITRECTOMY;  Surgeon: Valdemar Rogue, MD;  Location: [facility] OR;  Service: Ophthalmology;  Laterality: Right;   PHOTOCOAGULATION WITH LASER Right 05/19/2022   Procedure: PHOTOCOAGULATION WITH LASER;  Surgeon: Valdemar Rogue, MD;  Location[facility] OR;  Service: Ophthalmology;  Laterality: Right;   WISDOM TOOTH EXTRACTION     FAMILY HISTORY Family History  Problem Relation Age of Onset   Diabetes Mellitus II Mother    Hypertension Mother    Cataracts Mother    Diabetes Mellitus II Father    Stroke Father    Hypertension Father    Glaucoma Maternal Grandmother    Amblyopia Neg Hx    Blindness Neg Hx    Macular degeneration Neg Hx    Retinal detachment Neg Hx    Strabismus Neg Hx    Retinitis pigmentosa Neg Hx    SOCIAL HISTORY Social History   Tobacco Use   Smoking status: Never   Smokeless tobacco: Never  Vaping Use   Vaping status: Never Used  Substance Use Topics   Alcohol use: Not Currently   Drug use: Not Currently       OPHTHALMIC EXAM:  Not recorded    IMAGING AND PROCEDURES  Imaging and Procedures for @TODAY @          ASSESSMENT/PLAN: No diagnosis found.  1,2. Proliferative diabetic retinopathy OU  - delayed f/u -- 11 wks instead of 8-10  - last A1c was 10.5 on 12.21.23; 11.9 on 05.11.23; 9.6 on 11.29.22; 7.8 on 7.20.22; 7.2 on 01.13.22  - s/p PRP OD (10.02.19), (01.22.20), (02.12.20), (03.08.22)  - s/p PRP OS (05/02/2018), (02.19.20), (2.17.22)  - s/p IVK #1 OD (01.22.20), OS (02.12.20)  - s/p IVA OD #1 (10.21.20), #2 (12.02.20), #3 (01.13.21), #4 (02.10.21), #5 (04.19.21), #6 (05.21.21), #7 (06.21.21), #8 (08.19.21), #9 (10.11.21), #10 (12.01.21), #11 (01.25.22), #12 (03.01.22), #13 (04.05.22), #14 (6.6.22), #15 (09.22.22), #16 (03.28.23), #17 (08.04.23), #18 (11.01.23 for [facility]), #19 (11.29.23), #20 (01.02.23), #21 (02.12.24), #22 (04.09.24), #23  (06.24.24), #24 (09.09.24)  - s/p IVA OS #1 (12.02.20), #2 (04.19.21), #3 (05.21.21), #4 (06.21.21), #5 (08.19.21), #6 (10.11.21), #7 (12.01.21), #8 (01.25.22), #9 (03.01.22), #10 (04.05.22), #11 (6.6.22), #12 (09.22.22), #13 (12.04.23), #14 (01.02.23), #15 (02.12.24), #16 (04.09.24), #17 (06.24.24), #18 (09.09.24)  - FA 8.19.19 shows leaking MA, patches of capillary nonperfusion OU  - repeat FA 10.11.21 shows persistent NV OU, large patches of vascular nonperfusion OU (OS>OD) -- fill in PRP completed OU (OS  2.17.22, OD 3.8.22)  - repeat FA 9.13.22 shows interval improvement in NV/leakage  - pre-VH BCVA OD: 20/40  - s/p PPV/MP OD, 12.07.2023             - doing well   - BCVA 20/50 OD -- decreased (cataract)             -  history of steroid response -- IOP elevated to 31+ prior -- improved to 11 today  - cont Cosopt  and Brim BID OD             - post op drop instructions reviewed   - OCT shows OD: Mild ERM with sharpening of foveal contour and mild pucker, persistent cystic changes superior macula -- slightly improved, stable improvement in vitreous opacities and tractional fibrosis; OS: stable improvement in vitreous opacities at 11 weeks  - recommend IVA OU (OD #25 and OS #19) today, 07.30.25 with follow up in 12 weeks  - pt wishes to proceed with injections  - RBA of procedure discussed, questions answered - informed consent obtained and signed - see procedure note - f/u 12 weeks -- DFE/OCT, possible injxns -- sign new consent  3,4. 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 Zulema in mid August 2019 with a 2 wk history intractable headaches and decreased vision OU  - found to have BP of 170s/90s -- sent to ED and was admitted to [facility] where SBP was up to 200  - discussed importance of tight BP control   - pt reports compliance with BP meds and BP has improved  5. 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  Limited 2 sequence MRI head: Acute RIGHT periatrial subcentimeter  nonhemorrhagic infarct, in region of optic tract.  - could be limiting field of vision-will check formal VF once edema improves  6. Ocular Hypertention OU  - today, IOP 11 OU  - continue cosopt  and brimonidine  bid OU  7. Mixed cataract OD - The symptoms of cataract, surgical options, and treatments and risks were discussed with patient. - discussed diagnosis and progression - surgery scheduled for March 07, 2023 with Dr. Fleeta  8. Pseudophakia OS  - s/p CE/IOL (07.29.21, Dr. Lelon)  - IOL in good position, doing well - continue to monitor  Ophthalmic Meds Ordered this visit:  No orders of the defined types were placed in this encounter.    No follow-ups on file.  There are no Patient Instructions on file for this visit.   This document serves as a record of services personally performed by Redell JUDITHANN Hans, MD, PhD. It was created on their behalf by Auston Muzzy, COMT. The creation of this record is the provider's dictation and/or activities during the visit.  This document serves as a record of services personally performed by Redell JUDITHANN Hans, MD, PhD. It was created on their behalf by Almetta Pesa, an ophthalmic technician. The creation of this record is the provider's dictation and/or activities during the visit.    Electronically signed by: Almetta Pesa, OA, 01/04/24  1:12 PM   Redell JUDITHANN Hans, M.D., Ph.D. Diseases & Surgery of the Retina and Vitreous [facility]   Abbreviations: M myopia (nearsighted); A astigmatism; H hyperopia (farsighted); P presbyopia; Mrx spectacle prescription;  CTL contact lenses; OD right eye; OS left eye; OU both eyes  XT exotropia; ET esotropia; PEK punctate epithelial keratitis; PEE punctate epithelial erosions; DES dry eye syndrome; MGD meibomian gland dysfunction; ATs artificial tears; PFAT's preservative free artificial tears; NSC nuclear  sclerotic cataract; PSC posterior subcapsular cataract; ERM epi-retinal membrane; PVD posterior vitreous detachment; RD retinal detachment; DM diabetes mellitus; DR diabetic retinopathy; NPDR non-proliferative diabetic retinopathy; PDR proliferative diabetic retinopathy; CSME clinically significant macular edema; DME diabetic macular edema; dbh dot blot hemorrhages; CWS  cotton wool spot; POAG primary open angle glaucoma; C/D cup-to-disc ratio; HVF humphrey visual field; GVF goldmann visual field; OCT optical coherence tomography; IOP intraocular pressure; BRVO Branch retinal vein occlusion; CRVO central retinal vein occlusion; CRAO central retinal artery occlusion; BRAO branch retinal artery occlusion; RT retinal tear; SB scleral buckle; PPV pars plana vitrectomy; VH Vitreous hemorrhage; PRP panretinal laser photocoagulation; IVK intravitreal kenalog ; VMT vitreomacular traction; MH Macular hole;  NVD neovascularization of the disc; NVE neovascularization elsewhere; AREDS age related eye disease study; ARMD age related macular degeneration; POAG primary open angle glaucoma; EBMD epithelial/anterior basement membrane dystrophy; ACIOL anterior chamber intraocular lens; IOL intraocular lens; PCIOL posterior chamber intraocular lens; Phaco/IOL phacoemulsification with intraocular lens placement; PRK photorefractive keratectomy; LASIK laser assisted in situ keratomileusis; HTN hypertension; DM diabetes mellitus; COPD chronic obstructive pulmonary disease

## 2024-01-10 ENCOUNTER — Encounter (INDEPENDENT_AMBULATORY_CARE_PROVIDER_SITE_OTHER): Admitting: Ophthalmology

## 2024-01-10 DIAGNOSIS — Z961 Presence of intraocular lens: Secondary | ICD-10-CM

## 2024-01-10 DIAGNOSIS — H35033 Hypertensive retinopathy, bilateral: Secondary | ICD-10-CM

## 2024-01-10 DIAGNOSIS — H25811 Combined forms of age-related cataract, right eye: Secondary | ICD-10-CM

## 2024-01-10 DIAGNOSIS — E113513 Type 2 diabetes mellitus with proliferative diabetic retinopathy with macular edema, bilateral: Secondary | ICD-10-CM

## 2024-01-10 DIAGNOSIS — I10 Essential (primary) hypertension: Secondary | ICD-10-CM

## 2024-01-10 DIAGNOSIS — H40053 Ocular hypertension, bilateral: Secondary | ICD-10-CM

## 2024-01-10 DIAGNOSIS — I639 Cerebral infarction, unspecified: Secondary | ICD-10-CM

## 2024-01-10 DIAGNOSIS — H4313 Vitreous hemorrhage, bilateral: Secondary | ICD-10-CM

## 2024-01-10 NOTE — Progress Notes (Signed)
 [facility] - Clinic Note  01/19/2024     CHIEF COMPLAINT Patient presents for Retina Follow Up   HISTORY OF PRESENT ILLNESS: Bill [name] is a 43 y.o. male who presents to the clinic today for:  HPI     Retina Follow Up   Patient presents with  Diabetic Retinopathy.  In both eyes.  This started 6 years ago.  Duration of 11 months.  Since onset it is rapidly worsening.  I, the attending physician,  performed the HPI with the patient and updated documentation appropriately.        Comments   Pt is 7 months late for his follow up. Pt states for the past month he has has been losing vision in his left eye, he believes it is bleeding. He wasn't able to come in sooner due to a new job. Pt states right eye is still doing ok.  A1c=about 9, 6 months ago BS=pt hasn't worn in a month but averages in the 190's.      Last edited by Jaelyn Bourgoin, MD on 01/19/2024  4:22 PM.    Pt lost to f/u from 11 weeks to 11 months. Patient complains of loss of vision OS. Floaters OS started at end of April 2025, followed by blurry vision OS. Vision totally hazy OS for the past couple of weeks. BS not well controlled. Patient denies eye pain OS.  Referring physician: Theophilus Andrews, Tully GRADE, MD [address] [place],  [place] [address]  HISTORICAL INFORMATION:   Selected notes from the MEDICAL RECORD NUMBER Referred by Dr. Hallahan for concern of severe NPDR / PDR OU   CURRENT MEDICATIONS: Current Outpatient Medications (Ophthalmic Drugs)  Medication Sig   brimonidine  (ALPHAGAN ) 0.2 % ophthalmic solution Place 1 drop into the right eye 2 (two) times daily.   dorzolamide -timolol  (COSOPT ) 2-0.5 % ophthalmic solution Place 1 drop into the right eye 2 (two) times daily.   prednisoLONE  acetate (PRED FORTE ) 1 % ophthalmic suspension Place 1 drop into the right eye 4 (four) times daily.   No current facility-administered medications for this visit. (Ophthalmic Drugs)    Current Outpatient Medications (Other)  Medication Sig   atorvastatin  (LIPITOR ) 10 MG tablet TAKE 1 TABLET BY MOUTH EVERY DAY   cholecalciferol (VITAMIN D-400) 10 MCG (400 UNIT) TABS tablet Take 400 Units by mouth daily.   Continuous Glucose Sensor (FREESTYLE LIBRE 2 SENSOR) MISC Inject 1 sensor to the skin every 14 days for continuous glucose monitoring.   fluticasone (FLONASE) 50 MCG/ACT nasal spray Place 1 spray into both nostrils daily as needed for allergies or rhinitis.   gabapentin  (NEURONTIN ) 300 MG capsule Take 1 capsule (300 mg total) by mouth as directed. Take 300 mg daily for 1 day, then 300 mg twice daily for 1 day, then increase to 300 mg three times daily on day 3. Continue three times daily   LANTUS SOLOSTAR 100 UNIT/ML Solostar Pen Inject 18 Units into the skin nightly.   mycophenolate (MYFORTIC) 180 MG EC tablet Take 720 mg by mouth 2 (two) times daily.   predniSONE (DELTASONE) 5 MG tablet Take 5 mg by mouth daily with breakfast.   sertraline  (ZOLOFT ) 50 MG tablet TAKE 1 TABLET BY MOUTH EVERY DAY   sulfamethoxazole-trimethoprim (BACTRIM) 400-80 MG tablet Take 1 tablet by mouth every Monday, Wednesday, and Friday.   tacrolimus  (PROGRAF ) 1 MG capsule Take 3-4 mg by mouth See admin instructions. 4 mg in the morning, 3 mg  in the evening   valACYclovir  (VALTREX ) 1000 MG tablet Take 1 tablet (1,000 mg total) by mouth 3 (three) times daily.   No current facility-administered medications for this visit. (Other)   REVIEW OF SYSTEMS: ROS   Positive for: Neurological, Genitourinary, Endocrine, Cardiovascular, Eyes, Psychiatric Negative for: Constitutional, Gastrointestinal, Skin, Musculoskeletal, HENT, Respiratory, Allergic/Imm, Heme/Lymph Last edited by Elnor Avelina RAMAN, COT on 01/19/2024  1:57 PM.      ALLERGIES Allergies  Allergen Reactions   Food     Melons-itchy/headaches   PAST MEDICAL HISTORY Past Medical History:  Diagnosis Date   Anemia    Anxiety    situational    Diabetes mellitus without complication (HCC)    type 2   Diabetic retinopathy (HCC)    PDR OU   ESRD on dialysis (HCC)    living unrelated donor renal transplant 11/05/20   History of blood transfusion    Hypertension    PONV (postoperative nausea and vomiting)    03/22/2019- only 15 years ago   Renal disorder    Stroke [facility]) 2019   vision   Past Surgical History:  Procedure Laterality Date   A/V FISTULAGRAM N/A 03/11/2019   Procedure: A/V FISTULAGRAM - Left Arm;  Surgeon: Sheree Penne Bruckner, MD;  Location: [facility];  Service: Cardiovascular;  Laterality: N/A;   arm surgery Right    Fracture   AV FISTULA PLACEMENT Left 01/25/2019   Procedure: ARTERIOVENOUS (AV) FISTULA CREATION  LEFT UPPER  ARM;  Surgeon: Serene Gaile ORN, MD;  Location: [facility] OR;  Service: Vascular;  Laterality: Left;   BASCILIC VEIN TRANSPOSITION Left 03/27/2019   Procedure: SECOND STAGE BASILIC VEIN TRANSPOSITION LEFT ARM;  Surgeon: Serene Gaile ORN, MD;  Location: [facility] OR;  Service: Vascular;  Laterality: Left;   KIDNEY TRANSPLANT  11/05/2020   [facility]   MEMBRANE PEEL Right 05/19/2022   Procedure: MEMBRANE PEEL;  Surgeon: Valdemar Rogue, MD;  Location: [facility] OR;  Service: Ophthalmology;  Laterality: Right;   PARS PLANA VITRECTOMY Right 05/19/2022   Procedure: TWENTY FIVE GAUGE PARS PLANA VITRECTOMY;  Surgeon: Valdemar Rogue, MD;  Location: [facility] OR;  Service: Ophthalmology;  Laterality: Right;   PHOTOCOAGULATION WITH LASER Right 05/19/2022   Procedure: PHOTOCOAGULATION WITH LASER;  Surgeon: Valdemar Rogue, MD;  Location: [facility] OR;  Service: Ophthalmology;  Laterality: Right;   WISDOM TOOTH EXTRACTION     FAMILY HISTORY Family History  Problem Relation Age of Onset   Diabetes Mellitus II Mother    Hypertension Mother    Cataracts Mother    Diabetes Mellitus II Father    Stroke Father    Hypertension Father    Glaucoma Maternal Grandmother    Amblyopia Neg Hx    Blindness Neg Hx    Macular  degeneration Neg Hx    Retinal detachment Neg Hx    Strabismus Neg Hx    Retinitis pigmentosa Neg Hx    SOCIAL HISTORY Social History   Tobacco Use   Smoking status: Never   Smokeless tobacco: Never  Vaping Use   Vaping status: Never Used  Substance Use Topics   Alcohol use: Not Currently   Drug use: Not Currently       OPHTHALMIC EXAM:  Base Eye Exam     Visual Acuity (Snellen - Linear)       Right Left   Dist [place] 20/30 +2 HM   Dist ph [place] 20/25 -2 NI         Tonometry (Tonopen, 2:03  PM)       Right Left   Pressure 18 52         Tonometry #2 (Tonopen, 2:43 PM)       Right Left   Pressure  43         Tonometry Comments   2:12pm 1 gtts Dorzolamide  2% OS, 1 gtts Brimonidine  Timolol  0.2/0.5% OS        Pupils       Pupils Dark Light Shape React APD   Right PERRL 3 2 Round Minimal None   Left PERRL 3 2 Round Minimal NR         Visual Fields       Left Right     Full   Restrictions Total superior nasal, inferior nasal deficiencies          Extraocular Movement       Right Left    Full, Ortho Full, Ortho         Neuro/Psych     Oriented x3: Yes   Mood/Affect: Normal         Dilation     Both eyes: 1.0% Mydriacyl , 2.5% Phenylephrine  @ 2:08 PM           Slit Lamp and Fundus Exam     Slit Lamp Exam       Right Left   Lids/Lashes Dermatochalasis - upper lid Dermatochalasis - upper lid   Conjunctiva/Sclera mild melanosis White and quiet   Cornea 1+ inferior PEE, well healed temporal cataract wound Diffuse edema, central haze, fine neovascularization nasally and temporally, +KP   Anterior Chamber deep and clear deep, hazy view   Iris Round and dilated, No NVI Round and dilated, no obvious NVI   Lens PC IOL in good position, trace pigment on optic PC IOL in good position   Anterior Vitreous post vitrectomy, VH cleared Syneresis, diffuse VH         Fundus Exam       Right Left   Disc 3+Pallor, Sharp rim, fibrosis gone  no view   C/D Ratio 0.1    Macula Flat, Good foveal reflex, scattered MA/DBH, fibrosis improved, cystic changes superior macula -- improved, mild ERM with mild stria no view   Vessels attenuated, Tortuous, +fibrosis - improved, +NV inferior to disc -- regressing, +sheathing    Periphery Attached, rare DBH, good fill in PRP 360 extending to arcades, pre retinal heme-resolved            IMAGING AND PROCEDURES  Imaging and Procedures for @TODAY @  OCT, Retina - OU - Both Eyes       Right Eye Quality was good. Central Foveal Thickness: 262. Progression has improved. Findings include no SRF, abnormal foveal contour, epiretinal membrane, intraretinal fluid (Mild ERM with sharpening of foveal contour and mild pucker--slightly improved,diffuse IRA--greatest superiorly, stable improvement in vitreous opacities and tractional fibrosis).   Left Eye Quality was poor. Progression has no prior data. Findings include (No images obtained).   Notes *Images captured and stored on drive  Diagnosis / Impression:  PDR w/ tractional fibrosis OU OD: Mild ERM with sharpening of foveal contour and mild pucker, persistent cystic changes superior macula -- slightly improved, stable improvement in vitreous opacities and tractional fibrosis OS: no images obtained  Clinical management:  See below  Abbreviations: NFP - Normal foveal profile. CME - cystoid macular edema. PED - pigment epithelial detachment. IRF - intraretinal fluid. SRF - subretinal fluid. EZ - ellipsoid zone. ERM - epiretinal membrane.  ORA - outer retinal atrophy. ORT - outer retinal tubulation. SRHM - subretinal hyper-reflective material       B-Scan Ultrasound - OS - Left Eye       Quality was good. Findings included vitreous hemorrhage, vitreous opacities.   Notes **Images stored on drive**  Impression: OD: vitreous opacities consistent with hemorrhage; no obvious RT/RD or mass      Intravitreal Injection, Pharmacologic Agent  - OS - Left Eye       Time Out 01/19/2024. 3:20 PM. Confirmed correct patient, procedure, site, and patient consented.   Anesthesia Topical anesthesia was used. Anesthetic medications included Lidocaine  2%, Proparacaine  0.5%.   Procedure Preparation included 5% betadine to ocular surface, eyelid speculum. A supplied needle was used.   Injection: 1.25 mg Bevacizumab  1.25mg /0.100ml   Route: Intravitreal, Site: Left Eye   NDC: 49757-939-98, Lot: [id number], Expiration date: 02/05/2024   Post-op Post injection exam found visual acuity of at least counting fingers. The patient tolerated the procedure well. There were no complications. The patient received written and verbal post procedure care education.   Notes An AC tap was performed following injection due to elevated IOP using a 30 gauge needle on a syringe with the plunger removed. The needle was placed at the limbus at 5 oclock and approximately 0.09cc of aqueous was removed from the anterior chamber. Betadine was applied to the tap area before and after the paracentesis was performed. There were no complications. The patient tolerated the procedure well. The IOP was rechecked and was found to be ~11 mmHg by digital palpation.             ASSESSMENT/PLAN:   ICD-10-CM   1. Proliferative diabetic retinopathy of both eyes with macular edema associated with type 2 diabetes mellitus (HCC)  E11.3513 OCT, Retina - OU - Both Eyes    Intravitreal Injection, Pharmacologic Agent - OS - Left Eye    Bevacizumab  (AVASTIN ) SOLN 1.25 mg    2. Vitreous hemorrhage of both eyes (HCC)  H43.13 B-Scan Ultrasound - OS - Left Eye    Intravitreal Injection, Pharmacologic Agent - OS - Left Eye    Bevacizumab  (AVASTIN ) SOLN 1.25 mg    3. Essential hypertension  I10     4. Hypertensive retinopathy of both eyes  H35.033     5. Acute CVA (cerebrovascular accident) (HCC)  I63.9     6. Ocular hypertension, bilateral  H40.053     7. Combined forms of  age-related cataract of right eye  H25.811     8. Pseudophakia  Z96.1       1,2. Proliferative diabetic retinopathy OU  - delayed f/u -- 11 wks instead of 8-10  - last A1c was 10.5 on 12.21.23; 11.9 on 05.11.23; 9.6 on 11.29.22; 7.8 on 7.20.22; 7.2 on 01.13.22  - s/p PRP OD (10.02.19), (01.22.20), (02.12.20), (03.08.22)  - s/p PRP OS (05/02/2018), (02.19.20), (2.17.22)  - s/p IVK #1 OD (01.22.20), OS (02.12.20)  - s/p IVA OD #1 (10.21.20), #2 (12.02.20), #3 (01.13.21), #4 (02.10.21), #5 (04.19.21), #6 (05.21.21), #7 (06.21.21), #8 (08.19.21), #9 (10.11.21), #10 (12.01.21), #11 (01.25.22), #12 (03.01.22), #13 (04.05.22), #14 (6.6.22), #15 (09.22.22), #16 (03.28.23), #17 (08.04.23), #18 (11.01.23 for [facility]), #19 (11.29.23), #20 (01.02.23), #21 (02.12.24), #22 (04.09.24), #23 (06.24.24), #24 (09.09.24), #25 (07.30.25)  - s/p IVA OS #1 (12.02.20), #2 (04.19.21), #3 (05.21.21), #4 (06.21.21), #5 (08.19.21), #6 (10.11.21), #7 (12.01.21), #8 (01.25.22), #9 (03.01.22), #10 (04.05.22), #11 (6.6.22), #12 (09.22.22), #13 (12.04.23), #14 (01.02.23), #15 (02.12.24), #16 (04.09.24), #  17 (06.24.24), #18 (09.09.24), #19 (07.30.25)  - FA 8.19.19 shows leaking MA, patches of capillary nonperfusion OU  - repeat FA 10.11.21 shows persistent NV OU, large patches of vascular nonperfusion OU (OS>OD) -- fill in PRP completed OU (OS  2.17.22, OD 3.8.22)  - repeat FA 9.13.22 shows interval improvement in NV/leakage  - pre-VH BCVA OD: 20/40  - s/p PPV/MP OD, 12.07.2023             - doing well   - BCVA 20/50 OD -- decreased (cataract)             - history of steroid response -- IOP elevated to 31+ prior -- IOP 18 mmHg today  - cont Cosopt  and Brim BID OD             - post op drop instructions reviewed   - OCT shows OD: Mild ERM with sharpening of foveal contour and mild pucker, persistent cystic changes superior macula -- slightly improved, stable improvement in vitreous opacities and tractional fibrosis; OS: stable  improvement in vitreous opacities at 11 weeks  - recommend IVA OU (OD #26 and OS #20) today, 08.08.25 with follow up in 12 weeks  - pt wishes to proceed with injections  - RBA of procedure discussed, questions answered - informed consent obtained and signed - see procedure note -- ac tapped to lower IOP - f/u 12 weeks -- DFE/OCT, possible injxns -- sign new consent  3,4. 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 Zulema in mid August 2019 with a 2 wk history intractable headaches and decreased vision OU  - found to have BP of 170s/90s -- sent to ED and was admitted to [facility] where SBP was up to 200  - discussed importance of tight BP control   - pt reports compliance with BP meds and BP has improved  5. 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 Limited 2 sequence MRI head: Acute RIGHT periatrial subcentimeter  nonhemorrhagic infarct, in region of optic tract.  - could be limiting field of vision-will check formal VF once edema improves  6. Vitreous hemorrhage OS  - subacute VH OS - onset April 2026 -- delayed presentation due to work schedule - Bscan OS today   7. Ocular Hypertention OU  - today, IOP 18 OD, 40 OS  - patient stopped cosopt  and brimonidine  -- restart TID OS  8. NVG OS  - IOP 52 today, 08.08.25  - restart cosopt  and brimonidine  tid OU  7. Mixed cataract OD - The symptoms of cataract, surgical options, and treatments and risks were discussed with patient. - discussed diagnosis and progression - surgery scheduled for March 07, 2023 with Dr. Fleeta  8. Pseudophakia OS  - s/p CE/IOL (07.29.21, Dr. Lelon)  - IOL in good position, doing well - continue to monitor  Ophthalmic Meds Ordered this visit:  Meds ordered this encounter  Medications   Bevacizumab  (AVASTIN ) SOLN 1.25 mg     No follow-ups on file.  There are no Patient Instructions on file for this visit.   This document  serves as a record of services personally performed by Redell JUDITHANN Hans, MD, PhD. It was created on their behalf by Avelina Pereyra, COA an ophthalmic technician. The creation of this record is the provider's dictation and/or activities during the visit.   Electronically signed by: Avelina GORMAN Pereyra, COT  01/24/24  2:05 AM  This document serves as a record of services personally performed by Redell JUDITHANN Hans, MD, PhD. It was created on their behalf by Auston Muzzy, COMT. The creation of this record is the provider's dictation and/or activities during the visit.  Electronically signed by: Auston Muzzy, COMT 01/24/24 2:05 AM  Redell JUDITHANN Hans, M.D., Ph.D. Diseases & Surgery of the Retina and Vitreous [facility]  I have reviewed the above documentation for accuracy and completeness, and I agree with the above. Redell JUDITHANN Hans, M.D., Ph.D. 01/24/24 2:08 AM   Abbreviations: M myopia (nearsighted); A astigmatism; H hyperopia (farsighted); P presbyopia; Mrx spectacle prescription;  CTL contact lenses; OD right eye; OS left eye; OU both eyes  XT exotropia; ET esotropia; PEK punctate epithelial keratitis; PEE punctate epithelial erosions; DES dry eye syndrome; MGD meibomian gland dysfunction; ATs artificial tears; PFAT's preservative free artificial tears; NSC nuclear sclerotic cataract; PSC posterior subcapsular cataract; ERM epi-retinal membrane; PVD posterior vitreous detachment; RD retinal detachment; DM diabetes mellitus; DR diabetic retinopathy; NPDR non-proliferative diabetic retinopathy; PDR proliferative diabetic retinopathy; CSME clinically significant macular edema; DME diabetic macular edema; dbh dot blot hemorrhages; CWS cotton wool spot; POAG primary open angle glaucoma; C/D cup-to-disc ratio; HVF humphrey visual field; GVF goldmann visual field; OCT optical coherence tomography; IOP intraocular pressure; BRVO Branch retinal vein occlusion; CRVO central retinal vein occlusion;  CRAO central retinal artery occlusion; BRAO branch retinal artery occlusion; RT retinal tear; SB scleral buckle; PPV pars plana vitrectomy; VH Vitreous hemorrhage; PRP panretinal laser photocoagulation; IVK intravitreal kenalog ; VMT vitreomacular traction; MH Macular hole;  NVD neovascularization of the disc; NVE neovascularization elsewhere; AREDS age related eye disease study; ARMD age related macular degeneration; POAG primary open angle glaucoma; EBMD epithelial/anterior basement membrane dystrophy; ACIOL anterior chamber intraocular lens; IOL intraocular lens; PCIOL posterior chamber intraocular lens; Phaco/IOL phacoemulsification with intraocular lens placement; PRK photorefractive keratectomy; LASIK laser assisted in situ keratomileusis; HTN hypertension; DM diabetes mellitus; COPD chronic obstructive pulmonary disease

## 2024-01-19 ENCOUNTER — Encounter (INDEPENDENT_AMBULATORY_CARE_PROVIDER_SITE_OTHER): Payer: Self-pay | Admitting: Ophthalmology

## 2024-01-19 ENCOUNTER — Ambulatory Visit (INDEPENDENT_AMBULATORY_CARE_PROVIDER_SITE_OTHER): Admitting: Ophthalmology

## 2024-01-19 DIAGNOSIS — E113513 Type 2 diabetes mellitus with proliferative diabetic retinopathy with macular edema, bilateral: Secondary | ICD-10-CM

## 2024-01-19 DIAGNOSIS — H35033 Hypertensive retinopathy, bilateral: Secondary | ICD-10-CM | POA: Diagnosis not present

## 2024-01-19 DIAGNOSIS — H4313 Vitreous hemorrhage, bilateral: Secondary | ICD-10-CM | POA: Diagnosis not present

## 2024-01-19 DIAGNOSIS — I10 Essential (primary) hypertension: Secondary | ICD-10-CM

## 2024-01-19 DIAGNOSIS — Z961 Presence of intraocular lens: Secondary | ICD-10-CM

## 2024-01-19 DIAGNOSIS — I639 Cerebral infarction, unspecified: Secondary | ICD-10-CM

## 2024-01-19 DIAGNOSIS — H25811 Combined forms of age-related cataract, right eye: Secondary | ICD-10-CM

## 2024-01-19 DIAGNOSIS — H40053 Ocular hypertension, bilateral: Secondary | ICD-10-CM

## 2024-01-19 MED ORDER — BEVACIZUMAB CHEMO INJECTION 1.25MG/0.05ML SYRINGE FOR KALEIDOSCOPE
1.2500 mg | INTRAVITREAL | Status: AC | PRN
  Administered 2024-01-24: 1.25 mg via INTRAVITREAL

## 2024-01-29 NOTE — Progress Notes (Shared)
 [facility] - Clinic Note  01/30/2024     CHIEF COMPLAINT Patient presents for No chief complaint on file.   HISTORY OF PRESENT ILLNESS: Bill [name] is a 43 y.o. male who presents to the clinic today for:   Pt lost to f/u from 11 weeks to 11 months. Patient complains of loss of vision OS. Floaters OS started at end of April 2025, followed by blurry vision OS. Vision totally hazy OS for the past couple of weeks. BS not well controlled. Patient denies eye pain OS.  Referring physician: Theophilus Andrews, Tully GRADE, MD [address] [place],  [place] [address]  HISTORICAL INFORMATION:   Selected notes from the MEDICAL RECORD NUMBER Referred by Dr. Hallahan for concern of severe NPDR / PDR OU   CURRENT MEDICATIONS: Current Outpatient Medications (Ophthalmic Drugs)  Medication Sig   brimonidine  (ALPHAGAN ) 0.2 % ophthalmic solution Place 1 drop into the right eye 2 (two) times daily.   dorzolamide -timolol  (COSOPT ) 2-0.5 % ophthalmic solution Place 1 drop into the right eye 2 (two) times daily.   prednisoLONE  acetate (PRED FORTE ) 1 % ophthalmic suspension Place 1 drop into the right eye 4 (four) times daily.   No current facility-administered medications for this visit. (Ophthalmic Drugs)   Current Outpatient Medications (Other)  Medication Sig   atorvastatin  (LIPITOR ) 10 MG tablet TAKE 1 TABLET BY MOUTH EVERY DAY   cholecalciferol (VITAMIN D-400) 10 MCG (400 UNIT) TABS tablet Take 400 Units by mouth daily.   Continuous Glucose Sensor (FREESTYLE LIBRE 2 SENSOR) MISC Inject 1 sensor to the skin every 14 days for continuous glucose monitoring.   fluticasone (FLONASE) 50 MCG/ACT nasal spray Place 1 spray into both nostrils daily as needed for allergies or rhinitis.   gabapentin  (NEURONTIN ) 300 MG capsule Take 1 capsule (300 mg total) by mouth as directed. Take 300 mg daily for 1 day, then 300 mg twice daily for 1 day, then increase to 300 mg three times daily  on day 3. Continue three times daily   LANTUS SOLOSTAR 100 UNIT/ML Solostar Pen Inject 18 Units into the skin nightly.   mycophenolate (MYFORTIC) 180 MG EC tablet Take 720 mg by mouth 2 (two) times daily.   predniSONE (DELTASONE) 5 MG tablet Take 5 mg by mouth daily with breakfast.   sertraline  (ZOLOFT ) 50 MG tablet TAKE 1 TABLET BY MOUTH EVERY DAY   sulfamethoxazole-trimethoprim (BACTRIM) 400-80 MG tablet Take 1 tablet by mouth every Monday, Wednesday, and Friday.   tacrolimus  (PROGRAF ) 1 MG capsule Take 3-4 mg by mouth See admin instructions. 4 mg in the morning, 3 mg in the evening   valACYclovir  (VALTREX ) 1000 MG tablet Take 1 tablet (1,000 mg total) by mouth 3 (three) times daily.   No current facility-administered medications for this visit. (Other)   REVIEW OF SYSTEMS:    ALLERGIES Allergies  Allergen Reactions   Food     Melons-itchy/headaches   PAST MEDICAL HISTORY Past Medical History:  Diagnosis Date   Anemia    Anxiety    situational   Diabetes mellitus without complication (HCC)    type 2   Diabetic retinopathy (HCC)    PDR OU   ESRD on dialysis (HCC)    living unrelated donor renal transplant 11/05/20   History of blood transfusion    Hypertension    PONV (postoperative nausea and vomiting)    03/22/2019- only 15 years ago   Renal disorder    Stroke (HCC)  2019   vision   Past Surgical History:  Procedure Laterality Date   A/V FISTULAGRAM N/A 03/11/2019   Procedure: A/V FISTULAGRAM - Left Arm;  Surgeon: Sheree Penne Bruckner, MD;  Location: [facility] INVASIVE CV LAB;  Service: Cardiovascular;  Laterality: N/A;   arm surgery Right    Fracture   AV FISTULA PLACEMENT Left 01/25/2019   Procedure: ARTERIOVENOUS (AV) FISTULA CREATION  LEFT UPPER  ARM;  Surgeon: Serene Gaile ORN, MD;  Location: [facility] OR;  Service: Vascular;  Laterality: Left;   BASCILIC VEIN TRANSPOSITION Left 03/27/2019   Procedure: SECOND STAGE BASILIC VEIN TRANSPOSITION LEFT ARM;  Surgeon: Serene Gaile ORN, MD;  Location: [facility] OR;  Service: Vascular;  Laterality: Left;   KIDNEY TRANSPLANT  11/05/2020   [facility]   MEMBRANE PEEL Right 05/19/2022   Procedure: MEMBRANE PEEL;  Surgeon: Valdemar Rogue, MD;  Location: [facility] OR;  Service: Ophthalmology;  Laterality: Right;   PARS PLANA VITRECTOMY Right 05/19/2022   Procedure: TWENTY FIVE GAUGE PARS PLANA VITRECTOMY;  Surgeon: Valdemar Rogue, MD;  Location: [facility] OR;  Service: Ophthalmology;  Laterality: Right;   PHOTOCOAGULATION WITH LASER Right 05/19/2022   Procedure: PHOTOCOAGULATION WITH LASER;  Surgeon: Valdemar Rogue, MD;  Location: [facility] OR;  Service: Ophthalmology;  Laterality: Right;   WISDOM TOOTH EXTRACTION     FAMILY HISTORY Family History  Problem Relation Age of Onset   Diabetes Mellitus II Mother    Hypertension Mother    Cataracts Mother    Diabetes Mellitus II Father    Stroke Father    Hypertension Father    Glaucoma Maternal Grandmother    Amblyopia Neg Hx    Blindness Neg Hx    Macular degeneration Neg Hx    Retinal detachment Neg Hx    Strabismus Neg Hx    Retinitis pigmentosa Neg Hx    SOCIAL HISTORY Social History   Tobacco Use   Smoking status: Never   Smokeless tobacco: Never  Vaping Use   Vaping status: Never Used  Substance Use Topics   Alcohol use: Not Currently   Drug use: Not Currently       OPHTHALMIC EXAM:  Not recorded    IMAGING AND PROCEDURES  Imaging and Procedures for @TODAY @           ASSESSMENT/PLAN: No diagnosis found.   1,2. Proliferative diabetic retinopathy OU  - delayed f/u -- 11 wks instead of 8-10  - last A1c was 10.5 on 12.21.23; 11.9 on 05.11.23; 9.6 on 11.29.22; 7.8 on 7.20.22; 7.2 on 01.13.22  - s/p PRP OD (10.02.19), (01.22.20), (02.12.20), (03.08.22)  - s/p PRP OS (05/02/2018), (02.19.20), (2.17.22)  - s/p IVK #1 OD (01.22.20), OS (02.12.20)  - s/p IVA OD #1 (10.21.20), #2 (12.02.20), #3 (01.13.21), #4 (02.10.21), #5 (04.19.21), #6 (05.21.21), #7 (06.21.21),  #8 (08.19.21), #9 (10.11.21), #10 (12.01.21), #11 (01.25.22), #12 (03.01.22), #13 (04.05.22), #14 (6.6.22), #15 (09.22.22), #16 (03.28.23), #17 (08.04.23), #18 (11.01.23 for [facility]), #19 (11.29.23), #20 (01.02.23), #21 (02.12.24), #22 (04.09.24), #23 (06.24.24), #24 (09.09.24), #25 (07.30.25), #26 (08.08.25)  - s/p IVA OS #1 (12.02.20), #2 (04.19.21), #3 (05.21.21), #4 (06.21.21), #5 (08.19.21), #6 (10.11.21), #7 (12.01.21), #8 (01.25.22), #9 (03.01.22), #10 (04.05.22), #11 (6.6.22), #12 (09.22.22), #13 (12.04.23), #14 (01.02.23), #15 (02.12.24), #16 (04.09.24), #17 (06.24.24), #18 (09.09.24), #19 (07.30.25), #20 (08.08.25)  - FA 8.19.19 shows leaking MA, patches of capillary nonperfusion OU  - repeat FA 10.11.21 shows persistent NV OU, large patches of vascular nonperfusion OU (OS>OD) -- fill in PRP completed  OU (OS  2.17.22, OD 3.8.22)  - repeat FA 9.13.22 shows interval improvement in NV/leakage  - pre-VH BCVA OD: 20/40  - s/p PPV/MP OD, 12.07.2023             - doing well   - BCVA 20/50 OD -- decreased (cataract)             - history of steroid response -- IOP elevated to 31+ prior -- IOP 18 mmHg today  - cont Cosopt  and Brim BID OD             - post op drop instructions reviewed   - OCT shows OD: Mild ERM with sharpening of foveal contour and mild pucker, persistent cystic changes superior macula -- slightly improved, stable improvement in vitreous opacities and tractional fibrosis; OS: stable improvement in vitreous opacities at 11 weeks  - recommend IVA OU (OD #27 and OS #21) today, 08.19.25 with follow up in 12 weeks  - pt wishes to proceed with injections  - RBA of procedure discussed, questions answered - informed consent obtained and signed - see procedure note -- ac tapped to lower IOP - f/u 12 weeks -- DFE/OCT, possible injxns -- sign new consent  3,4. 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 Zulema in mid August 2019 with a 2 wk history intractable  headaches and decreased vision OU  - found to have BP of 170s/90s -- sent to ED and was admitted to [facility] where SBP was up to 200  - discussed importance of tight BP control   - pt reports compliance with BP meds and BP has improved  5. 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 Limited 2 sequence MRI head: Acute RIGHT periatrial subcentimeter  nonhemorrhagic infarct, in region of optic tract.  - could be limiting field of vision-will check formal VF once edema improves  6. Vitreous hemorrhage OS  - subacute VH OS - onset April 2026 -- delayed presentation due to work schedule - Bscan OS today  7. Ocular Hypertention OU  - today, IOP 18 OD, 40 OS  - patient stopped cosopt  and brimonidine  -- restart TID OS  8. NVG OS  - IOP 52 today, 08.08.25  - restart cosopt  and brimonidine  tid OU  7. Mixed cataract OD - The symptoms of cataract, surgical options, and treatments and risks were discussed with patient. - discussed diagnosis and progression - surgery scheduled for March 07, 2023 with Dr. Fleeta  8. Pseudophakia OS  - s/p CE/IOL (07.29.21, Dr. Lelon)  - IOL in good position, doing well - continue to monitor  Ophthalmic Meds Ordered this visit:  No orders of the defined types were placed in this encounter.    No follow-ups on file.  There are no Patient Instructions on file for this visit.    This document serves as a record of services personally performed by Redell JUDITHANN Hans, MD, PhD. It was created on their behalf by Auston Muzzy, COMT. The creation of this record is the provider's dictation and/or activities during the visit.  Electronically signed by: Auston Muzzy, COMT 01/29/24 3:24 PM  Redell JUDITHANN Hans, M.D., Ph.D. Diseases & Surgery of the Retina and Vitreous [facility]   Abbreviations: M myopia (nearsighted); A astigmatism; H hyperopia (farsighted); P presbyopia; Mrx spectacle  prescription;  CTL contact lenses; OD right eye; OS left eye; OU both eyes  XT  exotropia; ET esotropia; PEK punctate epithelial keratitis; PEE punctate epithelial erosions; DES dry eye syndrome; MGD meibomian gland dysfunction; ATs artificial tears; PFAT's preservative free artificial tears; NSC nuclear sclerotic cataract; PSC posterior subcapsular cataract; ERM epi-retinal membrane; PVD posterior vitreous detachment; RD retinal detachment; DM diabetes mellitus; DR diabetic retinopathy; NPDR non-proliferative diabetic retinopathy; PDR proliferative diabetic retinopathy; CSME clinically significant macular edema; DME diabetic macular edema; dbh dot blot hemorrhages; CWS cotton wool spot; POAG primary open angle glaucoma; C/D cup-to-disc ratio; HVF humphrey visual field; GVF goldmann visual field; OCT optical coherence tomography; IOP intraocular pressure; BRVO Branch retinal vein occlusion; CRVO central retinal vein occlusion; CRAO central retinal artery occlusion; BRAO branch retinal artery occlusion; RT retinal tear; SB scleral buckle; PPV pars plana vitrectomy; VH Vitreous hemorrhage; PRP panretinal laser photocoagulation; IVK intravitreal kenalog ; VMT vitreomacular traction; MH Macular hole;  NVD neovascularization of the disc; NVE neovascularization elsewhere; AREDS age related eye disease study; ARMD age related macular degeneration; POAG primary open angle glaucoma; EBMD epithelial/anterior basement membrane dystrophy; ACIOL anterior chamber intraocular lens; IOL intraocular lens; PCIOL posterior chamber intraocular lens; Phaco/IOL phacoemulsification with intraocular lens placement; PRK photorefractive keratectomy; LASIK laser assisted in situ keratomileusis; HTN hypertension; DM diabetes mellitus; COPD chronic obstructive pulmonary disease

## 2024-01-30 ENCOUNTER — Encounter (INDEPENDENT_AMBULATORY_CARE_PROVIDER_SITE_OTHER): Admitting: Ophthalmology

## 2024-01-30 DIAGNOSIS — H4313 Vitreous hemorrhage, bilateral: Secondary | ICD-10-CM

## 2024-01-30 DIAGNOSIS — I639 Cerebral infarction, unspecified: Secondary | ICD-10-CM

## 2024-01-30 DIAGNOSIS — I10 Essential (primary) hypertension: Secondary | ICD-10-CM

## 2024-01-30 DIAGNOSIS — Z961 Presence of intraocular lens: Secondary | ICD-10-CM

## 2024-01-30 DIAGNOSIS — H35033 Hypertensive retinopathy, bilateral: Secondary | ICD-10-CM

## 2024-01-30 DIAGNOSIS — H40053 Ocular hypertension, bilateral: Secondary | ICD-10-CM

## 2024-01-30 DIAGNOSIS — H25811 Combined forms of age-related cataract, right eye: Secondary | ICD-10-CM

## 2024-01-30 DIAGNOSIS — E113513 Type 2 diabetes mellitus with proliferative diabetic retinopathy with macular edema, bilateral: Secondary | ICD-10-CM

## 2024-02-09 DIAGNOSIS — Z940 Kidney transplant status: Secondary | ICD-10-CM | POA: Diagnosis not present

## 2024-02-09 DIAGNOSIS — D631 Anemia in chronic kidney disease: Secondary | ICD-10-CM | POA: Diagnosis not present

## 2024-02-09 DIAGNOSIS — N1832 Chronic kidney disease, stage 3b: Secondary | ICD-10-CM | POA: Diagnosis not present

## 2024-02-09 DIAGNOSIS — I129 Hypertensive chronic kidney disease with stage 1 through stage 4 chronic kidney disease, or unspecified chronic kidney disease: Secondary | ICD-10-CM | POA: Diagnosis not present

## 2024-02-09 DIAGNOSIS — Z79899 Other long term (current) drug therapy: Secondary | ICD-10-CM | POA: Diagnosis not present

## 2024-02-09 DIAGNOSIS — E1122 Type 2 diabetes mellitus with diabetic chronic kidney disease: Secondary | ICD-10-CM | POA: Diagnosis not present

## 2024-02-09 DIAGNOSIS — E872 Acidosis, unspecified: Secondary | ICD-10-CM | POA: Diagnosis not present

## 2024-02-09 DIAGNOSIS — N1831 Chronic kidney disease, stage 3a: Secondary | ICD-10-CM | POA: Diagnosis not present

## 2024-02-09 LAB — COMPREHENSIVE METABOLIC PANEL WITH GFR
Albumin: 3.9 (ref 3.5–5.0)
Calcium: 9.6 (ref 8.7–10.7)
Globulin: 2.5
eGFR: 50

## 2024-02-09 LAB — HEPATIC FUNCTION PANEL
ALT: 10 U/L (ref 10–40)
AST: 10 — AB (ref 14–40)

## 2024-02-09 LAB — CBC AND DIFFERENTIAL
HCT: 39 — AB (ref 41–53)
Hemoglobin: 11.7 — AB (ref 13.5–17.5)
Platelets: 162 K/uL (ref 150–400)
WBC: 4.5

## 2024-02-09 LAB — BASIC METABOLIC PANEL WITH GFR
BUN: 41 — AB (ref 4–21)
CO2: 16 (ref 13–22)
Chloride: 94 — AB (ref 99–108)
Creatinine: 1.7 — AB (ref 0.6–1.3)
Glucose: 503
Potassium: 5.1 meq/L (ref 3.5–5.1)
Sodium: 126 — AB (ref 137–147)

## 2024-02-09 LAB — CBC: RBC: 4.56 (ref 3.87–5.11)

## 2024-02-12 LAB — LAB REPORT - SCANNED
Creatinine, POC: 37.4 mg/dL
EGFR: 50

## 2024-02-15 ENCOUNTER — Encounter: Payer: Self-pay | Admitting: Internal Medicine

## 2024-02-21 NOTE — Progress Notes (Signed)
 [facility] - Clinic Note  02/22/2024     CHIEF COMPLAINT Patient presents for Retina Follow Up   HISTORY OF PRESENT ILLNESS: Bill [name] is a 43 y.o. male who presents to the clinic today for:  HPI     Retina Follow Up   Patient presents with  Diabetic Retinopathy.  In both eyes.  This started years ago.  Duration of 4 weeks.  Since onset it is rapidly worsening.  I, the attending physician,  performed the HPI with the patient and updated documentation appropriately.        Comments   Patient feels the vision is slowly clearing up. He is using Cosopt  and Brimonidine  tid OS only. His blood sugar was 146 last time.       Last edited by Valdemar Rogue, MD on 02/22/2024  2:41 PM.    Pt states  Referring physician: Theophilus Andrews, Tully GRADE, MD [address] [place],  [place] [address]  HISTORICAL INFORMATION:   Selected notes from the MEDICAL RECORD NUMBER Referred by Dr. Hallahan for concern of severe NPDR / PDR OU   CURRENT MEDICATIONS: Current Outpatient Medications (Ophthalmic Drugs)  Medication Sig   brimonidine  (ALPHAGAN ) 0.2 % ophthalmic solution Place 1 drop into the left eye 3 (three) times daily.   dorzolamide -timolol  (COSOPT ) 2-0.5 % ophthalmic solution Place 1 drop into the left eye in the morning, at noon, and at bedtime.   brimonidine  (ALPHAGAN ) 0.2 % ophthalmic solution Place 1 drop into the right eye 2 (two) times daily.   dorzolamide -timolol  (COSOPT ) 2-0.5 % ophthalmic solution Place 1 drop into the right eye 2 (two) times daily.   prednisoLONE  acetate (PRED FORTE ) 1 % ophthalmic suspension Place 1 drop into the right eye 4 (four) times daily.   No current facility-administered medications for this visit. (Ophthalmic Drugs)   Current Outpatient Medications (Other)  Medication Sig   atorvastatin  (LIPITOR ) 10 MG tablet TAKE 1 TABLET BY MOUTH EVERY DAY   cholecalciferol (VITAMIN D-400) 10 MCG (400 UNIT) TABS tablet Take 400  Units by mouth daily.   Continuous Glucose Sensor (FREESTYLE LIBRE 2 SENSOR) MISC Inject 1 sensor to the skin every 14 days for continuous glucose monitoring.   fluticasone (FLONASE) 50 MCG/ACT nasal spray Place 1 spray into both nostrils daily as needed for allergies or rhinitis.   gabapentin  (NEURONTIN ) 300 MG capsule Take 1 capsule (300 mg total) by mouth as directed. Take 300 mg daily for 1 day, then 300 mg twice daily for 1 day, then increase to 300 mg three times daily on day 3. Continue three times daily   LANTUS SOLOSTAR 100 UNIT/ML Solostar Pen Inject 18 Units into the skin nightly.   mycophenolate (MYFORTIC) 180 MG EC tablet Take 720 mg by mouth 2 (two) times daily.   predniSONE (DELTASONE) 5 MG tablet Take 5 mg by mouth daily with breakfast.   sertraline  (ZOLOFT ) 50 MG tablet TAKE 1 TABLET BY MOUTH EVERY DAY   sulfamethoxazole-trimethoprim (BACTRIM) 400-80 MG tablet Take 1 tablet by mouth every Monday, Wednesday, and Friday.   tacrolimus  (PROGRAF ) 1 MG capsule Take 3-4 mg by mouth See admin instructions. 4 mg in the morning, 3 mg in the evening   valACYclovir  (VALTREX ) 1000 MG tablet Take 1 tablet (1,000 mg total) by mouth 3 (three) times daily.   No current facility-administered medications for this visit. (Other)   REVIEW OF SYSTEMS: ROS   Positive for: Neurological, Genitourinary, Endocrine, Cardiovascular, Eyes, Psychiatric  Negative for: Constitutional, Gastrointestinal, Skin, Musculoskeletal, HENT, Respiratory, Allergic/Imm, Heme/Lymph Last edited by Myra Wanda SAILOR, COT on 02/22/2024  8:19 AM.     ALLERGIES Allergies  Allergen Reactions   Food     Melons-itchy/headaches   PAST MEDICAL HISTORY Past Medical History:  Diagnosis Date   Anemia    Anxiety    situational   Diabetes mellitus without complication (HCC)    type 2   Diabetic retinopathy (HCC)    PDR OU   ESRD on dialysis (HCC)    living unrelated donor renal transplant 11/05/20   History of blood  transfusion    Hypertension    PONV (postoperative nausea and vomiting)    03/22/2019- only 15 years ago   Renal disorder    Stroke [facility]) 2019   vision   Past Surgical History:  Procedure Laterality Date   A/V FISTULAGRAM N/A 03/11/2019   Procedure: A/V FISTULAGRAM - Left Arm;  Surgeon: Sheree Penne Bruckner, MD;  Location: [facility] INVASIVE CV LAB;  Service: Cardiovascular;  Laterality: N/A;   arm surgery Right    Fracture   AV FISTULA PLACEMENT Left 01/25/2019   Procedure: ARTERIOVENOUS (AV) FISTULA CREATION  LEFT UPPER  ARM;  Surgeon: Serene Gaile ORN, MD;  Location: [facility] OR;  Service: Vascular;  Laterality: Left;   BASCILIC VEIN TRANSPOSITION Left 03/27/2019   Procedure: SECOND STAGE BASILIC VEIN TRANSPOSITION LEFT ARM;  Surgeon: Serene Gaile ORN, MD;  Location: [facility] OR;  Service: Vascular;  Laterality: Left;   KIDNEY TRANSPLANT  11/05/2020   [facility]   MEMBRANE PEEL Right 05/19/2022   Procedure: MEMBRANE PEEL;  Surgeon: Valdemar Rogue, MD;  Location: [facility] OR;  Service: Ophthalmology;  Laterality: Right;   PARS PLANA VITRECTOMY Right 05/19/2022   Procedure: TWENTY FIVE GAUGE PARS PLANA VITRECTOMY;  Surgeon: Valdemar Rogue, MD;  Location: [facility] OR;  Service: Ophthalmology;  Laterality: Right;   PHOTOCOAGULATION WITH LASER Right 05/19/2022   Procedure: PHOTOCOAGULATION WITH LASER;  Surgeon: Valdemar Rogue, MD;  Location: [facility] OR;  Service: Ophthalmology;  Laterality: Right;   WISDOM TOOTH EXTRACTION     FAMILY HISTORY Family History  Problem Relation Age of Onset   Diabetes Mellitus II Mother    Hypertension Mother    Cataracts Mother    Diabetes Mellitus II Father    Stroke Father    Hypertension Father    Glaucoma Maternal Grandmother    Amblyopia Neg Hx    Blindness Neg Hx    Macular degeneration Neg Hx    Retinal detachment Neg Hx    Strabismus Neg Hx    Retinitis pigmentosa Neg Hx    SOCIAL HISTORY Social History   Tobacco Use   Smoking status: Never   Smokeless tobacco:  Never  Vaping Use   Vaping status: Never Used  Substance Use Topics   Alcohol use: Not Currently   Drug use: Not Currently       OPHTHALMIC EXAM:  Base Eye Exam     Visual Acuity (Snellen - Linear)       Right Left   Dist [place] 20/30 -2 HM   Dist ph [place] 20/25 +2 NI         Tonometry (Tonopen, 8:23 AM)       Right Left   Pressure 15 53  Drops or Combigan, Brimonidine , and Latanoprost were given. Three minutes starting at 8:23A        Pupils       Dark Light Shape React APD  Right 4 3 Round Minimal None   Left 3 2 Round Minimal None         Visual Fields       Left Right     Full         Extraocular Movement       Right Left    Full, Ortho Full, Ortho         Neuro/Psych     Oriented x3: Yes   Mood/Affect: Normal         Dilation     Both eyes: 1.0% Mydriacyl , 2.5% Phenylephrine  @ 8:20 AM           Slit Lamp and Fundus Exam     Slit Lamp Exam       Right Left   Lids/Lashes Dermatochalasis - upper lid Dermatochalasis - upper lid   Conjunctiva/Sclera mild melanosis mild melanosis   Cornea Trace PEE, well healed temporal cataract wound Diffuse edema, central haze, fine neovascularization nasally and temporally, +KP   Anterior Chamber deep and clear deep and clear   Iris Round and dilated, No NVI Round and dilated, no obvious NVI   Lens PC IOL in good position, trace pigment on optic PC IOL in good position   Anterior Vitreous post vitrectomy, VH cleared Syneresis, diffuse VH, blood stained vit condensations         Fundus Exam       Right Left   Disc 3+Pallor, Sharp rim, fibrosis gone no view   C/D Ratio 0.1    Macula Flat, Good foveal reflex, scattered MA/DBH, fibrosis improved, cystic changes superior macula -- improved, mild ERM with mild stria no view   Vessels attenuated, Tortuous, +fibrosis - improved, +NV inferior to disc -- regressing, +sheathing    Periphery Attached, rare DBH, good fill in PRP 360 extending to arcades,  pre retinal heme-resolved Red reflex, very hazy view           IMAGING AND PROCEDURES  Imaging and Procedures for @TODAY @  OCT, Retina - OU - Both Eyes       Right Eye Quality was good. Central Foveal Thickness: 265. Progression has improved. Findings include no SRF, abnormal foveal contour, epiretinal membrane, intraretinal fluid (Diffuse retinal atrophy, mild ERM and pucker, diffuse IRA--greatest superiorly, stable improvement in vitreous opacities and tractional fibrosis).   Left Eye Quality was poor. Progression has no prior data. Findings include (No images obtained).   Notes *Images captured and stored on drive  Diagnosis / Impression:  PDR w/ tractional fibrosis OU OD: Diffuse retinal atrophy, mild ERM and pucker, diffuse IRA--greatest superiorly, stable improvement in vitreous opacities and tractional fibrosis OS: no images obtained  Clinical management:  See below  Abbreviations: NFP - Normal foveal profile. CME - cystoid macular edema. PED - pigment epithelial detachment. IRF - intraretinal fluid. SRF - subretinal fluid. EZ - ellipsoid zone. ERM - epiretinal membrane. ORA - outer retinal atrophy. ORT - outer retinal tubulation. SRHM - subretinal hyper-reflective material       Intravitreal Injection, Pharmacologic Agent - OS - Left Eye       Time Out 02/22/2024. 9:05 AM. Confirmed correct patient, procedure, site, and patient consented.   Anesthesia Topical anesthesia was used. Anesthetic medications included Lidocaine  2%, Proparacaine  0.5%.   Procedure Preparation included 5% betadine to ocular surface, eyelid speculum. A supplied needle was used.   Injection: 1.25 mg Bevacizumab  1.25mg /0.95ml   Route: Intravitreal, Site: Left Eye   NDC: [id number], Lot: [id number], Expiration date: 03/03/2024  Post-op Post injection exam found visual acuity of at least counting fingers. The patient tolerated the procedure well. There were no complications. The patient  received written and verbal post procedure care education.   Notes An AC tap was performed following injection due to elevated IOP using a 30 gauge needle on a syringe with the plunger removed. The needle was placed at the limbus at 5 oclock and approximately 0.10cc of aqueous was removed from the anterior chamber. Betadine was applied to the tap area before and after the paracentesis was performed. There were no complications. The patient tolerated the procedure well. The IOP was rechecked and was found to be ~11 mmHg by digital palpation.            ASSESSMENT/PLAN:   ICD-10-CM   1. Proliferative diabetic retinopathy of both eyes with macular edema associated with type 2 diabetes mellitus (HCC)  E11.3513 OCT, Retina - OU - Both Eyes    Intravitreal Injection, Pharmacologic Agent - OS - Left Eye    Bevacizumab  (AVASTIN ) SOLN 1.25 mg    2. Vitreous hemorrhage of both eyes (HCC)  H43.13 Intravitreal Injection, Pharmacologic Agent - OS - Left Eye    Bevacizumab  (AVASTIN ) SOLN 1.25 mg    3. Essential hypertension  I10     4. Hypertensive retinopathy of both eyes  H35.033     5. Acute CVA (cerebrovascular accident) (HCC)  I63.9     6. Ocular hypertension, bilateral  H40.053     7. 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 Pseudophakia  Z96.1      **delayed f/u -- 5 wks instead of 1-2 for f/u VH OD**  1. Proliferative diabetic retinopathy OU  - delayed f/u -- 11 wks instead of 8-10  - last A1c was 10.5 on 12.21.23; 11.9 on 05.11.23; 9.6 on 11.29.22; 7.8 on 7.20.22; 7.2 on 01.13.22  - s/p PRP OD (10.02.19), (01.22.20), (02.12.20), (03.08.22)  - s/p PRP OS (05/02/2018), (02.19.20), (2.17.22)  - s/p IVK #1 OD (01.22.20), OS (02.12.20)  - s/p IVA OD #1 (10.21.20), #2 (12.02.20), #3 (01.13.21), #4 (02.10.21), #5 (04.19.21), #6 (05.21.21), #7 (06.21.21), #8 (08.19.21), #9 (10.11.21), #10 (12.01.21), #11 (01.25.22), #12 (03.01.22), #13 (04.05.22), #14 (6.6.22),  #15 (09.22.22), #16 (03.28.23), #17 (08.04.23), #18 (11.01.23 for [facility]), #19 (11.29.23), #20 (01.02.23), #21 (02.12.24), #22 (04.09.24), #23 (06.24.24), #24 (09.09.24)  - s/p IVA OS #1 (12.02.20), #2 (04.19.21), #3 (05.21.21), #4 (06.21.21), #5 (08.19.21), #6 (10.11.21), #7 (12.01.21), #8 (01.25.22), #9 (03.01.22), #10 (04.05.22), #11 (6.6.22), #12 (09.22.22), #13 (12.04.23), #14 (01.02.23), #15 (02.12.24), #16 (04.09.24), #17 (06.24.24), #18 (09.09.24), #19 (07.30.25), #20 (08.08.25)  - FA 8.19.19 shows leaking MA, patches of capillary nonperfusion OU  - repeat FA 10.11.21 shows persistent NV OU, large patches of vascular nonperfusion OU (OS>OD) -- fill in PRP completed OU (OS  2.17.22, OD 3.8.22)  - repeat FA 9.13.22 shows interval improvement in NV/leakage  - pre-VH BCVA OD: 20/40  - s/p PPV/MP OD, 12.07.2023             - doing well OD --BCVA 20/25             - history of steroid response -- IOP elevated to 31+ prior-- IOP 15, 53 today  - cont Cosopt  and Brim TID OS--pt ran out of the purple gtts, refills sent and samples given  - OCT shows OD: Diffuse retinal atrophy, mild ERM and pucker, diffuse IRA--greatest superiorly, stable improvement in vitreous opacities  and tractional fibrosis; OS: no images obtained at 5 weeks  - exam shows persistent diffuse VH OS  - recommend IVA OS #21 today, 09.11.25 with follow up in 2 weeks  - pt wishes to proceed with injections  - RBA of procedure discussed, questions answered - informed consent obtained and signed - see procedure note -- AC tapped to lower IOP - f/u 2 weeks -- DFE/OCT/IOP chk  2,3. 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 Zulema in mid August 2019 with a 2 wk history intractable headaches and decreased vision OU  - found to have BP of 170s/90s -- sent to ED and was [facility] to [facility] where SBP was up to 200  - discussed importance of tight BP control   - pt reports compliance with BP meds and  BP has improved  4. 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 Limited 2 sequence MRI head: Acute RIGHT periatrial subcentimeter  nonhemorrhagic infarct, in region of optic tract.  - could be limiting field of vision-will check formal VF once edema improves  5. Vitreous hemorrhage OS  - subacute VH OS - onset April 2025 -- delayed presentation due to work schedule on 08.08.25  - b-scan u/s on 08.08.25 showed diffuse VH, no obvious RT/RD OS  - s/p IVA OS #20 on 08.08.25  - diffuse VH persists, BCVA remains HM  - recommend IVA OS #21 today, 09.11.25 as above  6,7. Ocular Hypertention OU / NVG OS  - today, IOP 15 OD, 53 OS  - cont brim and cosopt  TID OS only  8. Mixed cataract OD - The symptoms of cataract, surgical options, and treatments and risks were discussed with patient. - discussed diagnosis and progression - surgery scheduled for March 07, 2023 with Dr. Fleeta  9. Pseudophakia OS  - s/p CE/IOL (07.29.21, Dr. Lelon)  - IOL in good position, doing well - continue to monitor  Ophthalmic Meds Ordered this visit:  Meds ordered this encounter  Medications   brimonidine  (ALPHAGAN ) 0.2 % ophthalmic solution    Sig: Place 1 drop into the left eye 3 (three) times daily.    Dispense:  5 mL    Refill:  3   dorzolamide -timolol  (COSOPT ) 2-0.5 % ophthalmic solution    Sig: Place 1 drop into the left eye in the morning, at noon, and at bedtime.    Dispense:  10 mL    Refill:  3   Bevacizumab  (AVASTIN ) SOLN 1.25 mg     Return in about 2 weeks (around 03/07/2024) for PDR OU, IOP CHK, DFE, OCT.  There are no Patient Instructions on file for this visit.   This document serves as a record of services personally performed by Redell JUDITHANN Hans, MD, PhD. It was created on their behalf by Almetta Pesa, an ophthalmic technician. The creation of this record is the provider's dictation and/or activities during the visit.    Electronically signed by:  Almetta Pesa, OA, 02/22/24  2:43 PM  Redell JUDITHANN Hans, M.D., Ph.D. Diseases & Surgery of the Retina and Vitreous [facility]  I have reviewed the above documentation for accuracy and completeness, and I agree with the above. Redell JUDITHANN Hans, M.D., Ph.D. 02/22/24 2:51 PM   Abbreviations: M myopia (nearsighted); A astigmatism; H hyperopia (farsighted); P presbyopia; Mrx spectacle prescription;  CTL contact lenses; OD right eye; OS left eye; OU both eyes  XT  exotropia; ET esotropia; PEK punctate epithelial keratitis; PEE punctate epithelial erosions; DES dry eye syndrome; MGD meibomian gland dysfunction; ATs artificial tears; PFAT's preservative free artificial tears; NSC nuclear sclerotic cataract; PSC posterior subcapsular cataract; ERM epi-retinal membrane; PVD posterior vitreous detachment; RD retinal detachment; DM diabetes mellitus; DR diabetic retinopathy; NPDR non-proliferative diabetic retinopathy; PDR proliferative diabetic retinopathy; CSME clinically significant macular edema; DME diabetic macular edema; dbh dot blot hemorrhages; CWS cotton wool spot; POAG primary open angle glaucoma; C/D cup-to-disc ratio; HVF humphrey visual field; GVF goldmann visual field; OCT optical coherence tomography; IOP intraocular pressure; BRVO Branch retinal vein occlusion; CRVO central retinal vein occlusion; CRAO central retinal artery occlusion; BRAO branch retinal artery occlusion; RT retinal tear; SB scleral buckle; PPV pars plana vitrectomy; VH Vitreous hemorrhage; PRP panretinal laser photocoagulation; IVK intravitreal kenalog ; VMT vitreomacular traction; MH Macular hole;  NVD neovascularization of the disc; NVE neovascularization elsewhere; AREDS age related eye disease study; ARMD age related macular degeneration; POAG primary open angle glaucoma; EBMD epithelial/anterior basement membrane dystrophy; ACIOL anterior chamber intraocular lens; IOL intraocular lens; PCIOL posterior  chamber intraocular lens; Phaco/IOL phacoemulsification with intraocular lens placement; PRK photorefractive keratectomy; LASIK laser assisted in situ keratomileusis; HTN hypertension; DM diabetes mellitus; COPD chronic obstructive pulmonary disease

## 2024-02-22 ENCOUNTER — Ambulatory Visit (INDEPENDENT_AMBULATORY_CARE_PROVIDER_SITE_OTHER): Admitting: Ophthalmology

## 2024-02-22 ENCOUNTER — Encounter (INDEPENDENT_AMBULATORY_CARE_PROVIDER_SITE_OTHER): Payer: Self-pay | Admitting: Ophthalmology

## 2024-02-22 DIAGNOSIS — H25811 Combined forms of age-related cataract, right eye: Secondary | ICD-10-CM

## 2024-02-22 DIAGNOSIS — H40053 Ocular hypertension, bilateral: Secondary | ICD-10-CM | POA: Diagnosis not present

## 2024-02-22 DIAGNOSIS — H4052X3 Glaucoma secondary to other eye disorders, left eye, severe stage: Secondary | ICD-10-CM

## 2024-02-22 DIAGNOSIS — Z961 Presence of intraocular lens: Secondary | ICD-10-CM | POA: Diagnosis not present

## 2024-02-22 DIAGNOSIS — I10 Essential (primary) hypertension: Secondary | ICD-10-CM

## 2024-02-22 DIAGNOSIS — H4313 Vitreous hemorrhage, bilateral: Secondary | ICD-10-CM

## 2024-02-22 DIAGNOSIS — E113513 Type 2 diabetes mellitus with proliferative diabetic retinopathy with macular edema, bilateral: Secondary | ICD-10-CM | POA: Diagnosis not present

## 2024-02-22 DIAGNOSIS — H35033 Hypertensive retinopathy, bilateral: Secondary | ICD-10-CM | POA: Diagnosis not present

## 2024-02-22 DIAGNOSIS — I639 Cerebral infarction, unspecified: Secondary | ICD-10-CM

## 2024-02-22 LAB — HM DIABETES EYE EXAM

## 2024-02-22 MED ORDER — BEVACIZUMAB CHEMO INJECTION 1.25MG/0.05ML SYRINGE FOR KALEIDOSCOPE
1.2500 mg | INTRAVITREAL | Status: AC | PRN
  Administered 2024-02-22: 1.25 mg via INTRAVITREAL

## 2024-02-22 MED ORDER — DORZOLAMIDE HCL-TIMOLOL MAL 2-0.5 % OP SOLN: 1.0000 [drp] | Freq: Three times a day (TID) | OPHTHALMIC | 3 refills | Status: DC

## 2024-02-22 MED ORDER — BRIMONIDINE TARTRATE 0.2 % OP SOLN: 1.0000 [drp] | Freq: Three times a day (TID) | OPHTHALMIC | 3 refills | Status: AC

## 2024-03-04 NOTE — Progress Notes (Signed)
 [facility] - Clinic Note  03/08/2024     CHIEF COMPLAINT Patient presents for Retina Follow Up   HISTORY OF PRESENT ILLNESS: Bill [name] is a 43 y.o. male who presents to the clinic today for:  HPI     Retina Follow Up   Patient presents with  Diabetic Retinopathy.  In both eyes.  This started 2 weeks ago.  I, the attending physician,  performed the HPI with the patient and updated documentation appropriately.        Comments   Patient here for 2 weeks retina follow up for PDR OU/IOP check. Patient states vision about the same. OS trying to clear up with medication. No eye pain. Using gtts.      Last edited by Valdemar Rogue, MD on 03/13/2024  8:15 PM.     Pt states the vision is slowly clearing.   Referring physician: Theophilus Andrews, Tully GRADE, MD [address] [place],  [place] [address]  HISTORICAL INFORMATION:   Selected notes from the MEDICAL RECORD NUMBER Referred by Dr. Hallahan for concern of severe NPDR / PDR OU   CURRENT MEDICATIONS: Current Outpatient Medications (Ophthalmic Drugs)  Medication Sig   brimonidine  (ALPHAGAN ) 0.2 % ophthalmic solution Place 1 drop into the right eye 2 (two) times daily.   brimonidine  (ALPHAGAN ) 0.2 % ophthalmic solution Place 1 drop into the left eye 3 (three) times daily.   dorzolamide -timolol  (COSOPT ) 2-0.5 % ophthalmic solution Place 1 drop into the right eye 2 (two) times daily.   dorzolamide -timolol  (COSOPT ) 2-0.5 % ophthalmic solution Place 1 drop into the left eye in the morning, at noon, and at bedtime.   latanoprost  (XALATAN ) 0.005 % ophthalmic solution Place 1 drop into the left eye daily.   loteprednol  (LOTEMAX ) 0.5 % ophthalmic suspension Place 1 drop into the left eye 3 (three) times daily.   prednisoLONE  acetate (PRED FORTE ) 1 % ophthalmic suspension Place 1 drop into the right eye 4 (four) times daily.   No current facility-administered medications for this visit. (Ophthalmic  Drugs)   Current Outpatient Medications (Other)  Medication Sig   atorvastatin  (LIPITOR ) 10 MG tablet TAKE 1 TABLET BY MOUTH EVERY DAY   cholecalciferol (VITAMIN D-400) 10 MCG (400 UNIT) TABS tablet Take 400 Units by mouth daily.   Continuous Glucose Sensor (FREESTYLE LIBRE 2 SENSOR) MISC Inject 1 sensor to the skin every 14 days for continuous glucose monitoring.   fluticasone (FLONASE) 50 MCG/ACT nasal spray Place 1 spray into both nostrils daily as needed for allergies or rhinitis.   gabapentin  (NEURONTIN ) 300 MG capsule Take 1 capsule (300 mg total) by mouth as directed. Take 300 mg daily for 1 day, then 300 mg twice daily for 1 day, then increase to 300 mg three times daily on day 3. Continue three times daily   LANTUS SOLOSTAR 100 UNIT/ML Solostar Pen Inject 18 Units into the skin nightly.   mycophenolate (MYFORTIC) 180 MG EC tablet Take 720 mg by mouth 2 (two) times daily.   predniSONE (DELTASONE) 5 MG tablet Take 5 mg by mouth daily with breakfast.   sertraline  (ZOLOFT ) 50 MG tablet TAKE 1 TABLET BY MOUTH EVERY DAY   sulfamethoxazole-trimethoprim (BACTRIM) 400-80 MG tablet Take 1 tablet by mouth every Monday, Wednesday, and Friday.   tacrolimus  (PROGRAF ) 1 MG capsule Take 3-4 mg by mouth See admin instructions. 4 mg in the morning, 3 mg in the evening   valACYclovir  (VALTREX ) 1000 MG tablet Take 1  tablet (1,000 mg total) by mouth 3 (three) times daily.   No current facility-administered medications for this visit. (Other)   REVIEW OF SYSTEMS: ROS   Positive for: Neurological, Genitourinary, Endocrine, Cardiovascular, Eyes, Psychiatric Negative for: Constitutional, Gastrointestinal, Skin, Musculoskeletal, HENT, Respiratory, Allergic/Imm, Heme/Lymph Last edited by Orval Asberry RAMAN, COA on 03/08/2024  8:12 AM.      ALLERGIES Allergies  Allergen Reactions   Food     Melons-itchy/headaches   PAST MEDICAL HISTORY Past Medical History:  Diagnosis Date   Anemia    Anxiety     situational   Diabetes mellitus without complication (HCC)    type 2   Diabetic retinopathy (HCC)    PDR OU   ESRD on dialysis (HCC)    living unrelated donor renal transplant 11/05/20   History of blood transfusion    Hypertension    PONV (postoperative nausea and vomiting)    03/22/2019- only 15 years ago   Renal disorder    Stroke [facility]) 2019   vision   Past Surgical History:  Procedure Laterality Date   A/V FISTULAGRAM N/A 03/11/2019   Procedure: A/V FISTULAGRAM - Left Arm;  Surgeon: Sheree Penne Bruckner, MD;  Location: [facility] INVASIVE CV LAB;  Service: Cardiovascular;  Laterality: N/A;   arm surgery Right    Fracture   AV FISTULA PLACEMENT Left 01/25/2019   Procedure: ARTERIOVENOUS (AV) FISTULA CREATION  LEFT UPPER  ARM;  Surgeon: Serene Gaile ORN, MD;  Location: [facility] OR;  Service: Vascular;  Laterality: Left;   BASCILIC VEIN TRANSPOSITION Left 03/27/2019   Procedure: SECOND STAGE BASILIC VEIN TRANSPOSITION LEFT ARM;  Surgeon: Serene Gaile ORN, MD;  Location: [facility] OR;  Service: Vascular;  Laterality: Left;   KIDNEY TRANSPLANT  11/05/2020   [facility]   MEMBRANE PEEL Right 05/19/2022   Procedure: MEMBRANE PEEL;  Surgeon: Valdemar Rogue, MD;  Location: [facility] OR;  Service: Ophthalmology;  Laterality: Right;   PARS PLANA VITRECTOMY Right 05/19/2022   Procedure: TWENTY FIVE GAUGE PARS PLANA VITRECTOMY;  Surgeon: Valdemar Rogue, MD;  Location: [facility] OR;  Service: Ophthalmology;  Laterality: Right;   PHOTOCOAGULATION WITH LASER Right 05/19/2022   Procedure: PHOTOCOAGULATION WITH LASER;  Surgeon: Valdemar Rogue, MD;  Location: [facility] OR;  Service: Ophthalmology;  Laterality: Right;   WISDOM TOOTH EXTRACTION     FAMILY HISTORY Family History  Problem Relation Age of Onset   Diabetes Mellitus II Mother    Hypertension Mother    Cataracts Mother    Diabetes Mellitus II Father    Stroke Father    Hypertension Father    Glaucoma Maternal Grandmother    Amblyopia Neg Hx    Blindness Neg Hx     Macular degeneration Neg Hx    Retinal detachment Neg Hx    Strabismus Neg Hx    Retinitis pigmentosa Neg Hx    SOCIAL HISTORY Social History   Tobacco Use   Smoking status: Never   Smokeless tobacco: Never  Vaping Use   Vaping status: Never Used  Substance Use Topics   Alcohol use: Not Currently   Drug use: Not Currently       OPHTHALMIC EXAM:  Base Eye Exam     Visual Acuity (Snellen - Linear)       Right Left   Dist [place] 20/30 +1 CF at 2'   Dist ph [place] 20/25 -2          Tonometry (Tonopen, 8:09 AM)       Right Left  Pressure 15 33.34  Brimonidine  and Cosopt  given @ 8:09am        Pupils       Dark Light Shape React APD   Right 4 3 Round Minimal None   Left 3 2 Round Minimal None         Visual Fields (Counting fingers)       Left Right    Full Full         Extraocular Movement       Right Left    Full, Ortho Full, Ortho         Neuro/Psych     Oriented x3: Yes   Mood/Affect: Normal         Dilation     Both eyes: 1.0% Mydriacyl , 2.5% Phenylephrine  @ 8:09 AM           Slit Lamp and Fundus Exam     Slit Lamp Exam       Right Left   Lids/Lashes Dermatochalasis - upper lid Dermatochalasis - upper lid   Conjunctiva/Sclera mild melanosis mild melanosis, 1+ Injection   Cornea Trace PEE, well healed temporal cataract wound Diffuse edema, central haze, fine neovascularization nasally and temporally, +KP   Anterior Chamber deep and clear deep and clear   Iris Round and dilated, No NVI Round and dilated, no obvious NVI   Lens PC IOL in good position, trace pigment on optic PC IOL in good position   Anterior Vitreous post vitrectomy, VH cleared Syneresis, diffuse VH, blood stained vit condensations         Fundus Exam       Right Left   Disc 3+Pallor, Sharp rim, fibrosis gone no view   C/D Ratio 0.1    Macula Flat, Good foveal reflex, scattered MA/DBH, fibrosis improved, cystic changes superior macula -- improved, mild ERM  with mild stria no view   Vessels attenuated, Tortuous, +fibrosis - improved, +NV inferior to disc -- regressing, +sheathing    Periphery Attached, rare DBH, good fill in PRP 360 extending to arcades, pre retinal heme-resolved Red reflex - slightly improved, very hazy view           Refraction     Wearing Rx       Sphere Cylinder Axis Add   Right -0.75 +1.00 164 +2.00   Left -0.25 +0.75 152 +2.00           IMAGING AND PROCEDURES  Imaging and Procedures for @TODAY @  OCT, Retina - OU - Both Eyes       Right Eye Quality was good. Central Foveal Thickness: 274. Progression has improved. Findings include no SRF, abnormal foveal contour, epiretinal membrane, intraretinal fluid (Diffuse retinal atrophy, mild ERM and pucker, diffuse IRA--greatest superiorly, stable improvement in vitreous opacities and tractional fibrosis).   Left Eye Quality was poor. Progression has no prior data. Findings include (No images obtained).   Notes *Images captured and stored on drive  Diagnosis / Impression:  PDR w/ tractional fibrosis OU OD: Diffuse retinal atrophy, mild ERM and pucker, diffuse IRA--greatest superiorly, stable improvement in vitreous opacities and tractional fibrosis OS: no images obtained  Clinical management:  See below  Abbreviations: NFP - Normal foveal profile. CME - cystoid macular edema. PED - pigment epithelial detachment. IRF - intraretinal fluid. SRF - subretinal fluid. EZ - ellipsoid zone. ERM - epiretinal membrane. ORA - outer retinal atrophy. ORT - outer retinal tubulation. SRHM - subretinal hyper-reflective material  ASSESSMENT/PLAN:   ICD-10-CM   1. Proliferative diabetic retinopathy of both eyes with macular edema associated with type 2 diabetes mellitus (HCC)  E11.3513 OCT, Retina - OU - Both Eyes    2. Essential hypertension  I10     3. Hypertensive retinopathy of both eyes  H35.033     4. Acute CVA (cerebrovascular accident) (HCC)   I63.9     5. Vitreous hemorrhage of both eyes (HCC)  H43.13     6. Ocular hypertension, bilateral  H40.053     7. Neovascular glaucoma of left eye, severe stage  H40.52X3     8. Combined forms of age-related cataract of right eye  H25.811     9. Pseudophakia  Z96.1      1. Proliferative diabetic retinopathy OU  - delayed f/u -- 11 wks instead of 8-10 - last A1c was 10.5 on 12.21.23; 11.9 on 05.11.23; 9.6 on 11.29.22; 7.8 on 7.20.22; 7.2 on 01.13.22  - s/p PRP OD (10.02.19), (01.22.20), (02.12.20), (03.08.22)  - s/p PRP OS (05/02/2018), (02.19.20), (2.17.22)  - s/p IVK #1 OD (01.22.20), OS (02.12.20) - s/p IVA OD #1 (10.21.20), #2 (12.02.20), #3 (01.13.21), #4 (02.10.21), #5 (04.19.21), #6 (05.21.21), #7 (06.21.21), #8 (08.19.21), #9 (10.11.21), #10 (12.01.21), #11 (01.25.22), #12 (03.01.22), #13 (04.05.22), #14 (6.6.22), #15 (09.22.22), #16 (03.28.23), #17 (08.04.23), #18 (11.01.23 for [facility]), #19 (11.29.23), #20 (01.02.23), #21 (02.12.24), #22 (04.09.24), #23 (06.24.24), #24 (09.09.24) - s/p IVA OS #1 (12.02.20), #2 (04.19.21), #3 (05.21.21), #4 (06.21.21), #5 (08.19.21), #6 (10.11.21), #7 (12.01.21), #8 (01.25.22), #9 (03.01.22), #10 (04.05.22), #11 (6.6.22), #12 (09.22.22), #13 (12.04.23), #14 (01.02.23), #15 (02.12.24), #16 (04.09.24), #17 (06.24.24), #18 (09.09.24), #19 (07.30.25), #20 (08.08.25) #21 (09.11.2025) - FA 8.19.19 shows leaking MA, patches of capillary nonperfusion OU - repeat FA 10.11.21 shows persistent NV OU, large patches of vascular nonperfusion OU (OS>OD) -- fill in PRP completed OU (OS  2.17.22, OD 3.8.22) - repeat FA 9.13.22 shows interval improvement in NV/leakage  - pre-VH BCVA OD: 20/40  - s/p PPV/MP OD, 12.07.2023             - doing well OD --BCVA 20/25 - history of steroid response -- IOP elevated to 31+ prior-- IOP 15, 34 today - cont Cosopt  and Brim TID OS - start Latanoprost  OS at bedtime - rx sent to pharmacy on file - start Lotemax  SM OS TID - OCT shows  OD: Diffuse retinal atrophy, mild ERM and pucker, diffuse IRA--greatest superiorly, stable improvement in vitreous opacities and tractional fibrosis; OS: no images obtained  - exam shows persistent diffuse VH OS - not tx recommend at this time -- due for IVA OS #22 in 2 weeks  - f/u 2 weeks -- DFE/OCT/IOP chk/possible injection  2,3. 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 Zulema in mid August 2019 with a 2 wk history intractable headaches and decreased vision OU - found to have BP of 170s/90s -- sent to ED and was admitted to [facility] where SBP was up to 200  - discussed importance of tight BP control   - pt reports compliance with BP meds and BP has improved  4. Acute CVA secondary to uncontrolled HTN  - MRI on 8.14.19:  IMPRESSION: 1. Limited 2 sequence MRI head: Acute RIGHT periatrial subcentimeter  nonhemorrhagic infarct, in region of optic tract. - could be limiting field of vision-will check formal VF once edema improves  5. Vitreous hemorrhage OS - subacute VH OS - onset April 2025 --  delayed presentation due to work schedule on 08.08.25 - b-scan u/s on 08.08.25 showed diffuse VH, no obvious RT/RD OS  - s/p IVA OS #20 on 08.08.25  - diffuse VH persists, BCVA remains HM  - recommend IVA OS #22 today, 09.26.25 as above  6,7. Ocular Hypertention OU / NVG OS  - today, IOP 15 OD, 33,34 OS  - cont Brim and Cosopt  TID OS only  - start Latanoprost  OS QHS  8. Mixed cataract OD - The symptoms of cataract, surgical options, and treatments and risks were discussed with patient. - discussed diagnosis and progression - surgery scheduled for March 07, 2023 with Dr. Fleeta  9. Pseudophakia OS  - s/p CE/IOL (07.29.21, Dr. Lelon)  - IOL in good position, doing well - continue to monitor  Ophthalmic Meds Ordered this visit:  Meds ordered this encounter  Medications   latanoprost  (XALATAN ) 0.005 % ophthalmic solution    Sig: Place 1 drop  into the left eye daily.    Dispense:  1.5 mL    Refill:  11   loteprednol  (LOTEMAX ) 0.5 % ophthalmic suspension    Sig: Place 1 drop into the left eye 3 (three) times daily.    Dispense:  4.5 mL    Refill:  11     Return in about 2 weeks (around 03/22/2024) for f/u, PDR, DFE, OCT, Possible Injxn.  There are no Patient Instructions on file for this visit.   This document serves as a record of services personally performed by Redell JUDITHANN Hans, MD, PhD. It was created on their behalf by Delon Newness COT, an ophthalmic technician. The creation of this record is the provider's dictation and/or activities during the visit.    Electronically signed by: Delon Newness COT 09.22.2025  8:30 PM  This document serves as a record of services personally performed by Redell JUDITHANN Hans, MD, PhD. It was created on their behalf by Wanda GEANNIE Keens, COT an ophthalmic technician. The creation of this record is the provider's dictation and/or activities during the visit.    Electronically signed by:  Wanda GEANNIE Keens, COT  03/13/24 8:30 PM  Redell JUDITHANN Hans, M.D., Ph.D. Diseases & Surgery of the Retina and Vitreous [facility] 03/08/2024   I have reviewed the above documentation for accuracy and completeness, and I agree with the above. Redell JUDITHANN Hans, M.D., Ph.D. 03/13/24 8:41 PM   Abbreviations: M myopia (nearsighted); A astigmatism; H hyperopia (farsighted); P presbyopia; Mrx spectacle prescription;  CTL contact lenses; OD right eye; OS left eye; OU both eyes  XT exotropia; ET esotropia; PEK punctate epithelial keratitis; PEE punctate epithelial erosions; DES dry eye syndrome; MGD meibomian gland dysfunction; ATs artificial tears; PFAT's preservative free artificial tears; NSC nuclear sclerotic cataract; PSC posterior subcapsular cataract; ERM epi-retinal membrane; PVD posterior vitreous detachment; RD retinal detachment; DM diabetes mellitus; DR diabetic  retinopathy; NPDR non-proliferative diabetic retinopathy; PDR proliferative diabetic retinopathy; CSME clinically significant macular edema; DME diabetic macular edema; dbh dot blot hemorrhages; CWS cotton wool spot; POAG primary open angle glaucoma; C/D cup-to-disc ratio; HVF humphrey visual field; GVF goldmann visual field; OCT optical coherence tomography; IOP intraocular pressure; BRVO Branch retinal vein occlusion; CRVO central retinal vein occlusion; CRAO central retinal artery occlusion; BRAO branch retinal artery occlusion; RT retinal tear; SB scleral buckle; PPV pars plana vitrectomy; VH Vitreous hemorrhage; PRP panretinal laser photocoagulation; IVK intravitreal kenalog ; VMT vitreomacular traction; MH Macular hole;  NVD neovascularization of the disc; NVE neovascularization elsewhere; AREDS age  related eye disease study; ARMD age related macular degeneration; POAG primary open angle glaucoma; EBMD epithelial/anterior basement membrane dystrophy; ACIOL anterior chamber intraocular lens; IOL intraocular lens; PCIOL posterior chamber intraocular lens; Phaco/IOL phacoemulsification with intraocular lens placement; PRK photorefractive keratectomy; LASIK laser assisted in situ keratomileusis; HTN hypertension; DM diabetes mellitus; COPD chronic obstructive pulmonary disease

## 2024-03-08 ENCOUNTER — Encounter (INDEPENDENT_AMBULATORY_CARE_PROVIDER_SITE_OTHER): Payer: Self-pay | Admitting: Ophthalmology

## 2024-03-08 ENCOUNTER — Ambulatory Visit (INDEPENDENT_AMBULATORY_CARE_PROVIDER_SITE_OTHER): Admitting: Ophthalmology

## 2024-03-08 DIAGNOSIS — I10 Essential (primary) hypertension: Secondary | ICD-10-CM

## 2024-03-08 DIAGNOSIS — H35033 Hypertensive retinopathy, bilateral: Secondary | ICD-10-CM | POA: Diagnosis not present

## 2024-03-08 DIAGNOSIS — H25811 Combined forms of age-related cataract, right eye: Secondary | ICD-10-CM | POA: Diagnosis not present

## 2024-03-08 DIAGNOSIS — H40053 Ocular hypertension, bilateral: Secondary | ICD-10-CM | POA: Diagnosis not present

## 2024-03-08 DIAGNOSIS — E113513 Type 2 diabetes mellitus with proliferative diabetic retinopathy with macular edema, bilateral: Secondary | ICD-10-CM | POA: Diagnosis not present

## 2024-03-08 DIAGNOSIS — H4052X3 Glaucoma secondary to other eye disorders, left eye, severe stage: Secondary | ICD-10-CM | POA: Diagnosis not present

## 2024-03-08 DIAGNOSIS — Z961 Presence of intraocular lens: Secondary | ICD-10-CM | POA: Diagnosis not present

## 2024-03-08 DIAGNOSIS — I639 Cerebral infarction, unspecified: Secondary | ICD-10-CM

## 2024-03-08 DIAGNOSIS — H4313 Vitreous hemorrhage, bilateral: Secondary | ICD-10-CM | POA: Diagnosis not present

## 2024-03-08 MED ORDER — LOTEPREDNOL ETABONATE 0.5 % OP SUSP: 1.0000 [drp] | Freq: Three times a day (TID) | OPHTHALMIC | 11 refills | Status: AC

## 2024-03-08 MED ORDER — LATANOPROST 0.005 % OP SOLN: 1.0000 [drp] | Freq: Every day | OPHTHALMIC | 11 refills | Status: AC

## 2024-03-13 ENCOUNTER — Encounter (INDEPENDENT_AMBULATORY_CARE_PROVIDER_SITE_OTHER): Payer: Self-pay | Admitting: Ophthalmology

## 2024-03-18 NOTE — Progress Notes (Shared)
 [facility] - Clinic Note  03/22/2024     CHIEF COMPLAINT Patient presents for No chief complaint on file.   HISTORY OF PRESENT ILLNESS: Bill [name] is a 43 y.o. male who presents to the clinic today for:    Pt states the vision is slowly clearing.   Referring physician: Theophilus Andrews, Tully GRADE, MD [address] [place],  [place] [address]  HISTORICAL INFORMATION:   Selected notes from the MEDICAL RECORD NUMBER Referred by Dr. Hallahan for concern of severe NPDR / PDR OU   CURRENT MEDICATIONS: Current Outpatient Medications (Ophthalmic Drugs)  Medication Sig   brimonidine  (ALPHAGAN ) 0.2 % ophthalmic solution Place 1 drop into the right eye 2 (two) times daily.   brimonidine  (ALPHAGAN ) 0.2 % ophthalmic solution Place 1 drop into the left eye 3 (three) times daily.   dorzolamide -timolol  (COSOPT ) 2-0.5 % ophthalmic solution Place 1 drop into the right eye 2 (two) times daily.   dorzolamide -timolol  (COSOPT ) 2-0.5 % ophthalmic solution Place 1 drop into the left eye in the morning, at noon, and at bedtime.   latanoprost  (XALATAN ) 0.005 % ophthalmic solution Place 1 drop into the left eye daily.   loteprednol  (LOTEMAX ) 0.5 % ophthalmic suspension Place 1 drop into the left eye 3 (three) times daily.   prednisoLONE  acetate (PRED FORTE ) 1 % ophthalmic suspension Place 1 drop into the right eye 4 (four) times daily.   No current facility-administered medications for this visit. (Ophthalmic Drugs)   Current Outpatient Medications (Other)  Medication Sig   atorvastatin  (LIPITOR ) 10 MG tablet TAKE 1 TABLET BY MOUTH EVERY DAY   cholecalciferol (VITAMIN D-400) 10 MCG (400 UNIT) TABS tablet Take 400 Units by mouth daily.   Continuous Glucose Sensor (FREESTYLE LIBRE 2 SENSOR) MISC Inject 1 sensor to the skin every 14 days for continuous glucose monitoring.   fluticasone (FLONASE) 50 MCG/ACT nasal spray Place 1 spray into both nostrils daily as needed for  allergies or rhinitis.   gabapentin  (NEURONTIN ) 300 MG capsule Take 1 capsule (300 mg total) by mouth as directed. Take 300 mg daily for 1 day, then 300 mg twice daily for 1 day, then increase to 300 mg three times daily on day 3. Continue three times daily   LANTUS SOLOSTAR 100 UNIT/ML Solostar Pen Inject 18 Units into the skin nightly.   mycophenolate (MYFORTIC) 180 MG EC tablet Take 720 mg by mouth 2 (two) times daily.   predniSONE (DELTASONE) 5 MG tablet Take 5 mg by mouth daily with breakfast.   sertraline  (ZOLOFT ) 50 MG tablet TAKE 1 TABLET BY MOUTH EVERY DAY   sulfamethoxazole-trimethoprim (BACTRIM) 400-80 MG tablet Take 1 tablet by mouth every Monday, Wednesday, and Friday.   tacrolimus  (PROGRAF ) 1 MG capsule Take 3-4 mg by mouth See admin instructions. 4 mg in the morning, 3 mg in the evening   valACYclovir  (VALTREX ) 1000 MG tablet Take 1 tablet (1,000 mg total) by mouth 3 (three) times daily.   No current facility-administered medications for this visit. (Other)   REVIEW OF SYSTEMS:    ALLERGIES Allergies  Allergen Reactions   Food     Melons-itchy/headaches   PAST MEDICAL HISTORY Past Medical History:  Diagnosis Date   Anemia    Anxiety    situational   Diabetes mellitus without complication (HCC)    type 2   Diabetic retinopathy (HCC)    PDR OU   ESRD on dialysis (HCC)    living unrelated donor renal  transplant 11/05/20   History of blood transfusion    Hypertension    PONV (postoperative nausea and vomiting)    03/22/2019- only 15 years ago   Renal disorder    Stroke [facility]) 2019   vision   Past Surgical History:  Procedure Laterality Date   A/V FISTULAGRAM N/A 03/11/2019   Procedure: A/V FISTULAGRAM - Left Arm;  Surgeon: Sheree Penne Bruckner, MD;  Location: [facility] INVASIVE CV LAB;  Service: Cardiovascular;  Laterality: N/A;   arm surgery Right    Fracture   AV FISTULA PLACEMENT Left 01/25/2019   Procedure: ARTERIOVENOUS (AV) FISTULA CREATION  LEFT UPPER  ARM;   Surgeon: Serene Gaile ORN, MD;  Location: [facility] OR;  Service: Vascular;  Laterality: Left;   BASCILIC VEIN TRANSPOSITION Left 03/27/2019   Procedure: SECOND STAGE BASILIC VEIN TRANSPOSITION LEFT ARM;  Surgeon: Serene Gaile ORN, MD;  Location: [facility] OR;  Service: Vascular;  Laterality: Left;   KIDNEY TRANSPLANT  11/05/2020   [facility]   MEMBRANE PEEL Right 05/19/2022   Procedure: MEMBRANE PEEL;  Surgeon: Valdemar Rogue, MD;  Location: [facility] OR;  Service: Ophthalmology;  Laterality: Right;   PARS PLANA VITRECTOMY Right 05/19/2022   Procedure: TWENTY FIVE GAUGE PARS PLANA VITRECTOMY;  Surgeon: Valdemar Rogue, MD;  Location: [facility] OR;  Service: Ophthalmology;  Laterality: Right;   PHOTOCOAGULATION WITH LASER Right 05/19/2022   Procedure: PHOTOCOAGULATION WITH LASER;  Surgeon: Valdemar Rogue, MD;  Location: [facility] OR;  Service: Ophthalmology;  Laterality: Right;   WISDOM TOOTH EXTRACTION     FAMILY HISTORY Family History  Problem Relation Age of Onset   Diabetes Mellitus II Mother    Hypertension Mother    Cataracts Mother    Diabetes Mellitus II Father    Stroke Father    Hypertension Father    Glaucoma Maternal Grandmother    Amblyopia Neg Hx    Blindness Neg Hx    Macular degeneration Neg Hx    Retinal detachment Neg Hx    Strabismus Neg Hx    Retinitis pigmentosa Neg Hx    SOCIAL HISTORY Social History   Tobacco Use   Smoking status: Never   Smokeless tobacco: Never  Vaping Use   Vaping status: Never Used  Substance Use Topics   Alcohol use: Not Currently   Drug use: Not Currently       OPHTHALMIC EXAM:  Not recorded    IMAGING AND PROCEDURES  Imaging and Procedures for @TODAY @          ASSESSMENT/PLAN: No diagnosis found.  1. Proliferative diabetic retinopathy OU  - delayed f/u -- 11 wks instead of 8-10 - last A1c was 10.5 on 12.21.23; 11.9 on 05.11.23; 9.6 on 11.29.22; 7.8 on 7.20.22; 7.2 on 01.13.22  - s/p PRP OD (10.02.19), (01.22.20), (02.12.20), (03.08.22)  -  s/p PRP OS (05/02/2018), (02.19.20), (2.17.22)  - s/p IVK #1 OD (01.22.20), OS (02.12.20) - s/p IVA OD #1 (10.21.20), #2 (12.02.20), #3 (01.13.21), #4 (02.10.21), #5 (04.19.21), #6 (05.21.21), #7 (06.21.21), #8 (08.19.21), #9 (10.11.21), #10 (12.01.21), #11 (01.25.22), #12 (03.01.22), #13 (04.05.22), #14 (6.6.22), #15 (09.22.22), #16 (03.28.23), #17 (08.04.23), #18 (11.01.23 for [facility]), #19 (11.29.23), #20 (01.02.23), #21 (02.12.24), #22 (04.09.24), #23 (06.24.24), #24 (09.09.24) - s/p IVA OS #1 (12.02.20), #2 (04.19.21), #3 (05.21.21), #4 (06.21.21), #5 (08.19.21), #6 (10.11.21), #7 (12.01.21), #8 (01.25.22), #9 (03.01.22), #10 (04.05.22), #11 (6.6.22), #12 (09.22.22), #13 (12.04.23), #14 (01.02.23), #15 (02.12.24), #16 (04.09.24), #17 (06.24.24), #18 (09.09.24), #19 (07.30.25), #20 (08.08.25) #21 (09.11.2025) - FA 8.19.19  shows leaking MA, patches of capillary nonperfusion OU - repeat FA 10.11.21 shows persistent NV OU, large patches of vascular nonperfusion OU (OS>OD) -- fill in PRP completed OU (OS  2.17.22, OD 3.8.22) - repeat FA 9.13.22 shows interval improvement in NV/leakage  - pre-VH BCVA OD: 20/40  - s/p PPV/MP OD, 12.07.2023             - doing well OD --BCVA 20/25 - history of steroid response -- IOP elevated to 31+ prior-- IOP 15, 34 today - cont Cosopt  and Brim TID OS - start Latanoprost  OS at bedtime - rx sent to pharmacy on file - start Lotemax  SM OS TID - OCT shows OD: Diffuse retinal atrophy, mild ERM and pucker, diffuse IRA--greatest superiorly, stable improvement in vitreous opacities and tractional fibrosis; OS: no images obtained  - exam shows persistent diffuse VH OS - not tx recommend at this time -- due for IVA OS #22 in 2 weeks  - f/u 2 weeks -- DFE/OCT/IOP chk/possible injection  2,3. 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 Zulema in mid August 2019 with a 2 wk history intractable headaches and decreased vision OU - found to have BP of  170s/90s -- sent to ED and was admitted to [facility] where SBP was up to 200  - discussed importance of tight BP control   - pt reports compliance with BP meds and BP has improved  4. Acute CVA secondary to uncontrolled HTN  - MRI on 8.14.19:  IMPRESSION: 1. Limited 2 sequence MRI head: Acute RIGHT periatrial subcentimeter  nonhemorrhagic infarct, in region of optic tract. - could be limiting field of vision-will check formal VF once edema improves  5. Vitreous hemorrhage OS - subacute VH OS - onset April 2025 -- delayed presentation due to work schedule on 08.08.25 - b-scan u/s on 08.08.25 showed diffuse VH, no obvious RT/RD OS  - s/p IVA OS #20 on 08.08.25  - diffuse VH persists, BCVA remains HM  - recommend IVA OS #22 today, 09.26.25 as above  6,7. Ocular Hypertention OU / NVG OS  - today, IOP 15 OD, 33,34 OS  - cont Brim and Cosopt  TID OS only  - start Latanoprost  OS QHS  8. Mixed cataract OD - The symptoms of cataract, surgical options, and treatments and risks were discussed with patient. - discussed diagnosis and progression - surgery scheduled for March 07, 2023 with Dr. Fleeta  9. Pseudophakia OS  - s/p CE/IOL (07.29.21, Dr. Lelon)  - IOL in good position, doing well - continue to monitor  Ophthalmic Meds Ordered this visit:  No orders of the defined types were placed in this encounter.    No follow-ups on file.  There are no Patient Instructions on file for this visit.   This document serves as a record of services personally performed by Redell JUDITHANN Hans, MD, PhD. It was created on their behalf by Delon Newness COT, an ophthalmic technician. The creation of this record is the provider's dictation and/or activities during the visit.    Electronically signed by: Delon Newness COT 10.06.2025 10:28 AM   Abbreviations: M myopia (nearsighted); A astigmatism; H hyperopia (farsighted); P presbyopia; Mrx spectacle prescription;  CTL contact  lenses; OD right eye; OS left eye; OU both eyes  XT exotropia; ET esotropia; PEK punctate epithelial keratitis; PEE punctate epithelial erosions; DES dry eye syndrome; MGD meibomian gland dysfunction; ATs artificial tears; PFAT's preservative free artificial tears; NSC nuclear sclerotic cataract; PSC  posterior subcapsular cataract; ERM epi-retinal membrane; PVD posterior vitreous detachment; RD retinal detachment; DM diabetes mellitus; DR diabetic retinopathy; NPDR non-proliferative diabetic retinopathy; PDR proliferative diabetic retinopathy; CSME clinically significant macular edema; DME diabetic macular edema; dbh dot blot hemorrhages; CWS cotton wool spot; POAG primary open angle glaucoma; C/D cup-to-disc ratio; HVF humphrey visual field; GVF goldmann visual field; OCT optical coherence tomography; IOP intraocular pressure; BRVO Branch retinal vein occlusion; CRVO central retinal vein occlusion; CRAO central retinal artery occlusion; BRAO branch retinal artery occlusion; RT retinal tear; SB scleral buckle; PPV pars plana vitrectomy; VH Vitreous hemorrhage; PRP panretinal laser photocoagulation; IVK intravitreal kenalog ; VMT vitreomacular traction; MH Macular hole;  NVD neovascularization of the disc; NVE neovascularization elsewhere; AREDS age related eye disease study; ARMD age related macular degeneration; POAG primary open angle glaucoma; EBMD epithelial/anterior basement membrane dystrophy; ACIOL anterior chamber intraocular lens; IOL intraocular lens; PCIOL posterior chamber intraocular lens; Phaco/IOL phacoemulsification with intraocular lens placement; PRK photorefractive keratectomy; LASIK laser assisted in situ keratomileusis; HTN hypertension; DM diabetes mellitus; COPD chronic obstructive pulmonary disease

## 2024-03-22 ENCOUNTER — Encounter (INDEPENDENT_AMBULATORY_CARE_PROVIDER_SITE_OTHER): Admitting: Ophthalmology

## 2024-03-22 DIAGNOSIS — H35033 Hypertensive retinopathy, bilateral: Secondary | ICD-10-CM

## 2024-03-22 DIAGNOSIS — I639 Cerebral infarction, unspecified: Secondary | ICD-10-CM

## 2024-03-22 DIAGNOSIS — H40053 Ocular hypertension, bilateral: Secondary | ICD-10-CM

## 2024-03-22 DIAGNOSIS — E113513 Type 2 diabetes mellitus with proliferative diabetic retinopathy with macular edema, bilateral: Secondary | ICD-10-CM

## 2024-03-22 DIAGNOSIS — H25811 Combined forms of age-related cataract, right eye: Secondary | ICD-10-CM

## 2024-03-22 DIAGNOSIS — I10 Essential (primary) hypertension: Secondary | ICD-10-CM

## 2024-03-22 DIAGNOSIS — H4052X3 Glaucoma secondary to other eye disorders, left eye, severe stage: Secondary | ICD-10-CM

## 2024-03-22 DIAGNOSIS — H4313 Vitreous hemorrhage, bilateral: Secondary | ICD-10-CM

## 2024-03-22 DIAGNOSIS — Z961 Presence of intraocular lens: Secondary | ICD-10-CM

## 2024-04-02 ENCOUNTER — Telehealth: Payer: Self-pay

## 2024-04-02 NOTE — Telephone Encounter (Signed)
 Patient was identified as falling into the [place] Measure - Diabetes.   Patient was: Left voicemail to schedule with primary care provider.

## 2024-04-23 NOTE — Progress Notes (Shared)
 [facility] - Clinic Note  04/25/2024     CHIEF COMPLAINT Patient presents for No chief complaint on file.   HISTORY OF PRESENT ILLNESS: Bill [name] is a 43 y.o. male who presents to the clinic today for:    Pt states the vision is slowly clearing.   Referring physician: Theophilus Andrews, Tully GRADE, MD [address] [place],  [place] [address]  HISTORICAL INFORMATION:   Selected notes from the MEDICAL RECORD NUMBER Referred by Dr. Hallahan for concern of severe NPDR / PDR OU   CURRENT MEDICATIONS: Current Outpatient Medications (Ophthalmic Drugs)  Medication Sig   brimonidine  (ALPHAGAN ) 0.2 % ophthalmic solution Place 1 drop into the right eye 2 (two) times daily.   brimonidine  (ALPHAGAN ) 0.2 % ophthalmic solution Place 1 drop into the left eye 3 (three) times daily.   dorzolamide -timolol  (COSOPT ) 2-0.5 % ophthalmic solution Place 1 drop into the right eye 2 (two) times daily.   dorzolamide -timolol  (COSOPT ) 2-0.5 % ophthalmic solution Place 1 drop into the left eye in the morning, at noon, and at bedtime.   latanoprost  (XALATAN ) 0.005 % ophthalmic solution Place 1 drop into the left eye daily.   loteprednol  (LOTEMAX ) 0.5 % ophthalmic suspension Place 1 drop into the left eye 3 (three) times daily.   prednisoLONE  acetate (PRED FORTE ) 1 % ophthalmic suspension Place 1 drop into the right eye 4 (four) times daily.   No current facility-administered medications for this visit. (Ophthalmic Drugs)   Current Outpatient Medications (Other)  Medication Sig   atorvastatin  (LIPITOR ) 10 MG tablet TAKE 1 TABLET BY MOUTH EVERY DAY   cholecalciferol (VITAMIN D-400) 10 MCG (400 UNIT) TABS tablet Take 400 Units by mouth daily.   Continuous Glucose Sensor (FREESTYLE LIBRE 2 SENSOR) MISC Inject 1 sensor to the skin every 14 days for continuous glucose monitoring.   fluticasone (FLONASE) 50 MCG/ACT nasal spray Place 1 spray into both nostrils daily as needed for  allergies or rhinitis.   gabapentin  (NEURONTIN ) 300 MG capsule Take 1 capsule (300 mg total) by mouth as directed. Take 300 mg daily for 1 day, then 300 mg twice daily for 1 day, then increase to 300 mg three times daily on day 3. Continue three times daily   LANTUS SOLOSTAR 100 UNIT/ML Solostar Pen Inject 18 Units into the skin nightly.   mycophenolate (MYFORTIC) 180 MG EC tablet Take 720 mg by mouth 2 (two) times daily.   predniSONE (DELTASONE) 5 MG tablet Take 5 mg by mouth daily with breakfast.   sertraline  (ZOLOFT ) 50 MG tablet TAKE 1 TABLET BY MOUTH EVERY DAY   sulfamethoxazole-trimethoprim (BACTRIM) 400-80 MG tablet Take 1 tablet by mouth every Monday, Wednesday, and Friday.   tacrolimus  (PROGRAF ) 1 MG capsule Take 3-4 mg by mouth See admin instructions. 4 mg in the morning, 3 mg in the evening   valACYclovir  (VALTREX ) 1000 MG tablet Take 1 tablet (1,000 mg total) by mouth 3 (three) times daily.   No current facility-administered medications for this visit. (Other)   REVIEW OF SYSTEMS:    ALLERGIES Allergies  Allergen Reactions   Food     Melons-itchy/headaches   PAST MEDICAL HISTORY Past Medical History:  Diagnosis Date   Anemia    Anxiety    situational   Diabetes mellitus without complication (HCC)    type 2   Diabetic retinopathy (HCC)    PDR OU   ESRD on dialysis (HCC)    living unrelated donor renal  transplant 11/05/20   History of blood transfusion    Hypertension    PONV (postoperative nausea and vomiting)    03/22/2019- only 15 years ago   Renal disorder    Stroke [facility]) 2019   vision   Past Surgical History:  Procedure Laterality Date   A/V FISTULAGRAM N/A 03/11/2019   Procedure: A/V FISTULAGRAM - Left Arm;  Surgeon: Sheree Penne Bruckner, MD;  Location: [facility] INVASIVE CV LAB;  Service: Cardiovascular;  Laterality: N/A;   arm surgery Right    Fracture   AV FISTULA PLACEMENT Left 01/25/2019   Procedure: ARTERIOVENOUS (AV) FISTULA CREATION  LEFT UPPER  ARM;   Surgeon: Serene Gaile ORN, MD;  Location: [facility] OR;  Service: Vascular;  Laterality: Left;   BASCILIC VEIN TRANSPOSITION Left 03/27/2019   Procedure: SECOND STAGE BASILIC VEIN TRANSPOSITION LEFT ARM;  Surgeon: Serene Gaile ORN, MD;  Location: [facility] OR;  Service: Vascular;  Laterality: Left;   KIDNEY TRANSPLANT  11/05/2020   [facility]   MEMBRANE PEEL Right 05/19/2022   Procedure: MEMBRANE PEEL;  Surgeon: Valdemar Rogue, MD;  Location: [facility] OR;  Service: Ophthalmology;  Laterality: Right;   PARS PLANA VITRECTOMY Right 05/19/2022   Procedure: TWENTY FIVE GAUGE PARS PLANA VITRECTOMY;  Surgeon: Valdemar Rogue, MD;  Location: [facility] OR;  Service: Ophthalmology;  Laterality: Right;   PHOTOCOAGULATION WITH LASER Right 05/19/2022   Procedure: PHOTOCOAGULATION WITH LASER;  Surgeon: Valdemar Rogue, MD;  Location: [facility] OR;  Service: Ophthalmology;  Laterality: Right;   WISDOM TOOTH EXTRACTION     FAMILY HISTORY Family History  Problem Relation Age of Onset   Diabetes Mellitus II Mother    Hypertension Mother    Cataracts Mother    Diabetes Mellitus II Father    Stroke Father    Hypertension Father    Glaucoma Maternal Grandmother    Amblyopia Neg Hx    Blindness Neg Hx    Macular degeneration Neg Hx    Retinal detachment Neg Hx    Strabismus Neg Hx    Retinitis pigmentosa Neg Hx    SOCIAL HISTORY Social History   Tobacco Use   Smoking status: Never   Smokeless tobacco: Never  Vaping Use   Vaping status: Never Used  Substance Use Topics   Alcohol use: Not Currently   Drug use: Not Currently       OPHTHALMIC EXAM:  Not recorded    IMAGING AND PROCEDURES  Imaging and Procedures for @TODAY @          ASSESSMENT/PLAN:   ICD-10-CM   1. Proliferative diabetic retinopathy of both eyes with macular edema associated with type 2 diabetes mellitus (HCC)  Z88.6486     2. Essential hypertension  I10     3. Hypertensive retinopathy of both eyes  H35.033     4. Acute CVA (cerebrovascular  accident) (HCC)  I63.9     5. Vitreous hemorrhage of both eyes (HCC)  H43.13     6. Ocular hypertension, bilateral  H40.053     7. Neovascular glaucoma of left eye, severe stage  H40.52X3     8. Combined forms of age-related cataract of right eye  H25.811     9. Pseudophakia  Z96.1       1. Proliferative diabetic retinopathy OU  - delayed f/u -- 11 wks instead of 8-10 - last A1c was 10.5 on 12.21.23; 11.9 on 05.11.23; 9.6 on 11.29.22; 7.8 on 7.20.22; 7.2 on 01.13.22  - s/p PRP OD (10.02.19), (01.22.20), (02.12.20), (03.08.22)  -  s/p PRP OS (05/02/2018), (02.19.20), (2.17.22)  - s/p IVK #1 OD (01.22.20), OS (02.12.20) - s/p IVA OD #1 (10.21.20), #2 (12.02.20), #3 (01.13.21), #4 (02.10.21), #5 (04.19.21), #6 (05.21.21), #7 (06.21.21), #8 (08.19.21), #9 (10.11.21), #10 (12.01.21), #11 (01.25.22), #12 (03.01.22), #13 (04.05.22), #14 (6.6.22), #15 (09.22.22), #16 (03.28.23), #17 (08.04.23), #18 (11.01.23 for [facility]), #19 (11.29.23), #20 (01.02.23), #21 (02.12.24), #22 (04.09.24), #23 (06.24.24), #24 (09.09.24) - s/p IVA OS #1 (12.02.20), #2 (04.19.21), #3 (05.21.21), #4 (06.21.21), #5 (08.19.21), #6 (10.11.21), #7 (12.01.21), #8 (01.25.22), #9 (03.01.22), #10 (04.05.22), #11 (6.6.22), #12 (09.22.22), #13 (12.04.23), #14 (01.02.23), #15 (02.12.24), #16 (04.09.24), #17 (06.24.24), #18 (09.09.24), #19 (07.30.25), #20 (08.08.25) #21 (09.11.2025) - FA 8.19.19 shows leaking MA, patches of capillary nonperfusion OU - repeat FA 10.11.21 shows persistent NV OU, large patches of vascular nonperfusion OU (OS>OD) -- fill in PRP completed OU (OS  2.17.22, OD 3.8.22) - repeat FA 9.13.22 shows interval improvement in NV/leakage  - pre-VH BCVA OD: 20/40  - s/p PPV/MP OD, 12.07.2023             - doing well OD --BCVA 20/25 - history of steroid response -- IOP elevated to 31+ prior-- IOP 15, 34 today - cont Cosopt  and Brim TID OS - start Latanoprost  OS at bedtime - rx sent to pharmacy on file - start Lotemax  SM  OS TID - OCT shows OD: Diffuse retinal atrophy, mild ERM and pucker, diffuse IRA--greatest superiorly, stable improvement in vitreous opacities and tractional fibrosis; OS: no images obtained  - exam shows persistent diffuse VH OS - not tx recommend at this time -- due for IVA OS #22 in 2 weeks  - f/u 2 weeks -- DFE/OCT/IOP chk/possible injection  2,3. 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 Zulema in mid August 2019 with a 2 wk history intractable headaches and decreased vision OU - found to have BP of 170s/90s -- sent to ED and was admitted to [facility] where SBP was up to 200  - discussed importance of tight BP control   - pt reports compliance with BP meds and BP has improved  4. Acute CVA secondary to uncontrolled HTN  - MRI on 8.14.19:  IMPRESSION: 1. Limited 2 sequence MRI head: Acute RIGHT periatrial subcentimeter  nonhemorrhagic infarct, in region of optic tract. - could be limiting field of vision-will check formal VF once edema improves  5. Vitreous hemorrhage OS - subacute VH OS - onset April 2025 -- delayed presentation due to work schedule on 08.08.25 - b-scan u/s on 08.08.25 showed diffuse VH, no obvious RT/RD OS  - s/p IVA OS #22 on 11.13.25  - diffuse VH persists, BCVA remains HM  - recommend IVA OS #22 today, 09.26.25 as above  6,7. Ocular Hypertention OU / NVG OS  - today, IOP 15 OD, 33,34 OS  - cont Brim and Cosopt  TID OS only  - start latanoprost  at bedtime OS  8. Mixed cataract OD - The symptoms of cataract, surgical options, and treatments and risks were discussed with patient. - discussed diagnosis and progression - surgery scheduled for March 07, 2023 with Dr. Fleeta  9. Pseudophakia OS  - s/p CE/IOL (07.29.21, Dr. Lelon)  - IOL in good position, doing well - continue to monitor  Ophthalmic Meds Ordered this visit:  No orders of the defined types were placed in this encounter.    No follow-ups on  file.  There are no Patient Instructions on file for this visit.  This document serves as a record of services personally performed by Redell JUDITHANN Hans, MD, PhD. It was created on their behalf by Auston Muzzy, COMT. The creation of this record is the provider's dictation and/or activities during the visit.  Electronically signed by: Auston Muzzy, COMT 04/23/24 10:45 AM    Redell JUDITHANN Hans, M.D., Ph.D. Diseases & Surgery of the Retina and Vitreous [facility]    Abbreviations: M myopia (nearsighted); A astigmatism; H hyperopia (farsighted); P presbyopia; Mrx spectacle prescription;  CTL contact lenses; OD right eye; OS left eye; OU both eyes  XT exotropia; ET esotropia; PEK punctate epithelial keratitis; PEE punctate epithelial erosions; DES dry eye syndrome; MGD meibomian gland dysfunction; ATs artificial tears; PFAT's preservative free artificial tears; NSC nuclear sclerotic cataract; PSC posterior subcapsular cataract; ERM epi-retinal membrane; PVD posterior vitreous detachment; RD retinal detachment; DM diabetes mellitus; DR diabetic retinopathy; NPDR non-proliferative diabetic retinopathy; PDR proliferative diabetic retinopathy; CSME clinically significant macular edema; DME diabetic macular edema; dbh dot blot hemorrhages; CWS cotton wool spot; POAG primary open angle glaucoma; C/D cup-to-disc ratio; HVF humphrey visual field; GVF goldmann visual field; OCT optical coherence tomography; IOP intraocular pressure; BRVO Branch retinal vein occlusion; CRVO central retinal vein occlusion; CRAO central retinal artery occlusion; BRAO branch retinal artery occlusion; RT retinal tear; SB scleral buckle; PPV pars plana vitrectomy; VH Vitreous hemorrhage; PRP panretinal laser photocoagulation; IVK intravitreal kenalog ; VMT vitreomacular traction; MH Macular hole;  NVD neovascularization of the disc; NVE neovascularization elsewhere; AREDS age related eye disease study; ARMD age  related macular degeneration; POAG primary open angle glaucoma; EBMD epithelial/anterior basement membrane dystrophy; ACIOL anterior chamber intraocular lens; IOL intraocular lens; PCIOL posterior chamber intraocular lens; Phaco/IOL phacoemulsification with intraocular lens placement; PRK photorefractive keratectomy; LASIK laser assisted in situ keratomileusis; HTN hypertension; DM diabetes mellitus; COPD chronic obstructive pulmonary disease

## 2024-04-24 ENCOUNTER — Encounter: Payer: Self-pay | Admitting: Internal Medicine

## 2024-04-25 ENCOUNTER — Telehealth: Payer: Self-pay

## 2024-04-25 ENCOUNTER — Encounter (INDEPENDENT_AMBULATORY_CARE_PROVIDER_SITE_OTHER): Admitting: Ophthalmology

## 2024-04-25 DIAGNOSIS — I10 Essential (primary) hypertension: Secondary | ICD-10-CM

## 2024-04-25 DIAGNOSIS — H4052X3 Glaucoma secondary to other eye disorders, left eye, severe stage: Secondary | ICD-10-CM

## 2024-04-25 DIAGNOSIS — H4313 Vitreous hemorrhage, bilateral: Secondary | ICD-10-CM

## 2024-04-25 DIAGNOSIS — H40053 Ocular hypertension, bilateral: Secondary | ICD-10-CM

## 2024-04-25 DIAGNOSIS — H35033 Hypertensive retinopathy, bilateral: Secondary | ICD-10-CM

## 2024-04-25 DIAGNOSIS — Z961 Presence of intraocular lens: Secondary | ICD-10-CM

## 2024-04-25 DIAGNOSIS — E113513 Type 2 diabetes mellitus with proliferative diabetic retinopathy with macular edema, bilateral: Secondary | ICD-10-CM

## 2024-04-25 DIAGNOSIS — H25811 Combined forms of age-related cataract, right eye: Secondary | ICD-10-CM

## 2024-04-25 DIAGNOSIS — I639 Cerebral infarction, unspecified: Secondary | ICD-10-CM

## 2024-04-25 NOTE — Telephone Encounter (Signed)
 Patient is overdue for an appointment. LMOM for patient to call and schedule.

## 2024-04-30 NOTE — Telephone Encounter (Signed)
 Called patient to schedule with PCP. Left message for patient to call back

## 2024-06-07 ENCOUNTER — Other Ambulatory Visit (INDEPENDENT_AMBULATORY_CARE_PROVIDER_SITE_OTHER): Payer: Self-pay | Admitting: Ophthalmology

## 2024-06-17 NOTE — Progress Notes (Signed)
 Bill [name]                                          MRN: [id number]   06/17/2024   The [facility] Quality Team Specialist reviewed this patient medical record for the purposes of chart review for care gap closure. The following were reviewed: chart review for care gap closure-glycemic status assessment.    [facility] Quality Team

## 2024-07-12 ENCOUNTER — Encounter: Payer: Self-pay | Admitting: *Deleted

## 2024-07-12 NOTE — Progress Notes (Unsigned)
 Jered Heiny                                          MRN: [id number]   07/12/2024   The [facility] Quality Team Specialist reviewed this patient medical record for the purposes of chart review for care gap closure. The following were reviewed: chart review for care gap closure-glycemic status assessment.    [facility] Quality Team

## 2024-07-12 NOTE — Progress Notes (Unsigned)
 Bill [name]                                          MRN: [id number]   07/12/2024   The [facility] Quality Team Specialist reviewed this patient medical record for the purposes of chart review for care gap closure. The following were reviewed: {CHL AMB [facility] QUALITY SPECIALIST REASON FOR REVIEW:[id number]}.    [facility] Quality Team
